# Patient Record
Sex: Female | Born: 1935 | Race: White | Hispanic: No | Smoking: Never smoker
Health system: Southern US, Community
[De-identification: ages and names within clinical notes are randomized; demographics above are authoritative.]

## PROBLEM LIST (undated history)

## (undated) DIAGNOSIS — I499 Cardiac arrhythmia, unspecified: Secondary | ICD-10-CM

## (undated) DIAGNOSIS — I10 Essential (primary) hypertension: Secondary | ICD-10-CM

## (undated) DIAGNOSIS — N3020 Other chronic cystitis without hematuria: Secondary | ICD-10-CM

## (undated) DIAGNOSIS — K219 Gastro-esophageal reflux disease without esophagitis: Secondary | ICD-10-CM

## (undated) DIAGNOSIS — D649 Anemia, unspecified: Secondary | ICD-10-CM

## (undated) DIAGNOSIS — N189 Chronic kidney disease, unspecified: Secondary | ICD-10-CM

## (undated) DIAGNOSIS — S81809A Unspecified open wound, unspecified lower leg, initial encounter: Secondary | ICD-10-CM

## (undated) DIAGNOSIS — M76899 Other specified enthesopathies of unspecified lower limb, excluding foot: Secondary | ICD-10-CM

## (undated) DIAGNOSIS — R269 Unspecified abnormalities of gait and mobility: Secondary | ICD-10-CM

## (undated) DIAGNOSIS — E11628 Type 2 diabetes mellitus with other skin complications: Secondary | ICD-10-CM

## (undated) DIAGNOSIS — K5900 Constipation, unspecified: Secondary | ICD-10-CM

## (undated) DIAGNOSIS — L270 Generalized skin eruption due to drugs and medicaments taken internally: Secondary | ICD-10-CM

## (undated) DIAGNOSIS — I96 Gangrene, not elsewhere classified: Secondary | ICD-10-CM

## (undated) DIAGNOSIS — I519 Heart disease, unspecified: Secondary | ICD-10-CM

## (undated) DIAGNOSIS — R270 Ataxia, unspecified: Secondary | ICD-10-CM

## (undated) DIAGNOSIS — L511 Stevens-Johnson syndrome: Secondary | ICD-10-CM

## (undated) DIAGNOSIS — IMO0002 Reserved for concepts with insufficient information to code with codable children: Secondary | ICD-10-CM

## (undated) DIAGNOSIS — S91009A Unspecified open wound, unspecified ankle, initial encounter: Secondary | ICD-10-CM

## (undated) DIAGNOSIS — R600 Localized edema: Secondary | ICD-10-CM

## (undated) DIAGNOSIS — E7800 Pure hypercholesterolemia, unspecified: Secondary | ICD-10-CM

## (undated) DIAGNOSIS — I509 Heart failure, unspecified: Secondary | ICD-10-CM

## (undated) DIAGNOSIS — L03119 Cellulitis of unspecified part of limb: Secondary | ICD-10-CM

## (undated) DIAGNOSIS — R310 Gross hematuria: Secondary | ICD-10-CM

## (undated) DIAGNOSIS — R339 Retention of urine, unspecified: Secondary | ICD-10-CM

## (undated) DIAGNOSIS — R32 Unspecified urinary incontinence: Secondary | ICD-10-CM

## (undated) DIAGNOSIS — I447 Left bundle-branch block, unspecified: Secondary | ICD-10-CM

## (undated) DIAGNOSIS — M7050 Other bursitis of knee, unspecified knee: Secondary | ICD-10-CM

## (undated) DIAGNOSIS — R350 Frequency of micturition: Secondary | ICD-10-CM

## (undated) DIAGNOSIS — S81009A Unspecified open wound, unspecified knee, initial encounter: Secondary | ICD-10-CM

## (undated) DIAGNOSIS — N179 Acute kidney failure, unspecified: Secondary | ICD-10-CM

## (undated) DIAGNOSIS — R300 Dysuria: Secondary | ICD-10-CM

## (undated) DIAGNOSIS — E8770 Fluid overload, unspecified: Secondary | ICD-10-CM

## (undated) DIAGNOSIS — E119 Type 2 diabetes mellitus without complications: Secondary | ICD-10-CM

## (undated) DIAGNOSIS — I4891 Unspecified atrial fibrillation: Secondary | ICD-10-CM

## (undated) DIAGNOSIS — F329 Major depressive disorder, single episode, unspecified: Secondary | ICD-10-CM

## (undated) HISTORY — DX: Type 2 diabetes mellitus with other skin complications: E11.628

## (undated) HISTORY — DX: Unspecified open wound, unspecified knee, initial encounter: S81.009A

## (undated) HISTORY — DX: Other bursitis of knee, unspecified knee: M70.50

## (undated) HISTORY — DX: Cellulitis of unspecified part of limb: L03.119

## (undated) HISTORY — DX: Gastro-esophageal reflux disease without esophagitis: K21.9

## (undated) HISTORY — DX: Chronic kidney disease, unspecified: N18.9

## (undated) HISTORY — DX: Anemia, unspecified: D64.9

## (undated) HISTORY — DX: Unspecified urinary incontinence: R32

## (undated) HISTORY — DX: Heart disease, unspecified: I51.9

## (undated) HISTORY — DX: Type 2 diabetes mellitus without complications: E11.9

## (undated) HISTORY — PX: BLADDER SURGERY: SHX569

## (undated) HISTORY — PX: TOE AMPUTATION: SHX809

## (undated) HISTORY — DX: Unspecified atrial fibrillation: I48.91

## (undated) HISTORY — DX: Unspecified abnormalities of gait and mobility: R26.9

## (undated) HISTORY — PX: TONSILLECTOMY: SUR1361

## (undated) HISTORY — DX: Pure hypercholesterolemia, unspecified: E78.00

## (undated) HISTORY — PX: NASAL SINUS SURGERY: SHX719

## (undated) HISTORY — PX: ENDOVENOUS ABLATION SAPHENOUS VEIN W/ LASER: SUR449

## (undated) HISTORY — DX: Other chronic cystitis without hematuria: N30.20

## (undated) HISTORY — DX: Gross hematuria: R31.0

## (undated) HISTORY — DX: Frequency of micturition: R35.0

## (undated) HISTORY — DX: Major depressive disorder, single episode, unspecified: F32.9

## (undated) HISTORY — DX: Localized edema: R60.0

## (undated) HISTORY — DX: Acute kidney failure, unspecified: N17.9

## (undated) HISTORY — DX: Constipation, unspecified: K59.00

## (undated) HISTORY — DX: Retention of urine, unspecified: R33.9

## (undated) HISTORY — DX: Ataxia, unspecified: R27.0

## (undated) HISTORY — DX: Other specified enthesopathies of unspecified lower limb, excluding foot: M76.899

## (undated) HISTORY — DX: Generalized skin eruption due to drugs and medicaments taken internally: L27.0

## (undated) HISTORY — DX: Unspecified open wound, unspecified lower leg, initial encounter: S81.809A

## (undated) HISTORY — DX: Essential (primary) hypertension: I10

## (undated) HISTORY — DX: Unspecified open wound, unspecified ankle, initial encounter: S91.009A

## (undated) HISTORY — PX: OTHER SURGICAL HISTORY: SHX169

## (undated) HISTORY — DX: Gangrene, not elsewhere classified: I96

## (undated) HISTORY — DX: Dysuria: R30.0

## (undated) HISTORY — DX: Stevens-Johnson syndrome: L51.1

## (undated) HISTORY — DX: Fluid overload, unspecified: E87.70

## (undated) HISTORY — DX: Reserved for concepts with insufficient information to code with codable children: IMO0002

## (undated) HISTORY — PX: ABDOMINAL HYSTERECTOMY: SHX81

## (undated) SURGERY — LEFT HEART CATH AND CORONARY ANGIOGRAPHY
Anesthesia: Moderate Sedation

---

## 1999-06-18 ENCOUNTER — Ambulatory Visit (HOSPITAL_COMMUNITY): Admission: RE | Admit: 1999-06-18 | Discharge: 1999-06-18 | Payer: Self-pay | Admitting: Gastroenterology

## 1999-06-25 ENCOUNTER — Encounter: Payer: Self-pay | Admitting: Gastroenterology

## 1999-06-25 ENCOUNTER — Ambulatory Visit (HOSPITAL_COMMUNITY): Admission: RE | Admit: 1999-06-25 | Discharge: 1999-06-25 | Payer: Self-pay | Admitting: Gastroenterology

## 2000-03-17 ENCOUNTER — Encounter: Payer: Self-pay | Admitting: Urology

## 2000-03-17 ENCOUNTER — Encounter: Admission: RE | Admit: 2000-03-17 | Discharge: 2000-03-17 | Payer: Self-pay | Admitting: Urology

## 2000-06-07 ENCOUNTER — Encounter: Payer: Self-pay | Admitting: Urology

## 2000-06-07 ENCOUNTER — Encounter: Admission: RE | Admit: 2000-06-07 | Discharge: 2000-06-07 | Payer: Self-pay | Admitting: Urology

## 2005-01-28 ENCOUNTER — Ambulatory Visit: Payer: Self-pay | Admitting: Unknown Physician Specialty

## 2005-04-15 ENCOUNTER — Ambulatory Visit: Payer: Self-pay | Admitting: Internal Medicine

## 2006-02-01 ENCOUNTER — Ambulatory Visit: Payer: Self-pay | Admitting: Unknown Physician Specialty

## 2007-02-03 ENCOUNTER — Ambulatory Visit: Payer: Self-pay | Admitting: Unknown Physician Specialty

## 2007-02-21 ENCOUNTER — Ambulatory Visit: Payer: Self-pay | Admitting: Unknown Physician Specialty

## 2007-05-15 ENCOUNTER — Inpatient Hospital Stay: Payer: Self-pay | Admitting: Internal Medicine

## 2007-05-15 ENCOUNTER — Other Ambulatory Visit: Payer: Self-pay

## 2007-07-03 ENCOUNTER — Inpatient Hospital Stay: Payer: Self-pay | Admitting: Internal Medicine

## 2007-07-03 ENCOUNTER — Other Ambulatory Visit: Payer: Self-pay

## 2007-07-27 ENCOUNTER — Ambulatory Visit: Payer: Self-pay | Admitting: Podiatry

## 2007-08-01 ENCOUNTER — Ambulatory Visit: Payer: Self-pay | Admitting: Internal Medicine

## 2007-08-03 ENCOUNTER — Observation Stay: Payer: Self-pay | Admitting: Internal Medicine

## 2007-08-19 ENCOUNTER — Ambulatory Visit: Payer: Self-pay | Admitting: Internal Medicine

## 2007-08-31 ENCOUNTER — Ambulatory Visit: Payer: Self-pay | Admitting: Internal Medicine

## 2007-10-01 ENCOUNTER — Ambulatory Visit: Payer: Self-pay | Admitting: Internal Medicine

## 2007-10-14 ENCOUNTER — Encounter: Payer: Self-pay | Admitting: Internal Medicine

## 2007-10-31 ENCOUNTER — Ambulatory Visit: Payer: Self-pay | Admitting: Internal Medicine

## 2007-10-31 ENCOUNTER — Encounter: Payer: Self-pay | Admitting: Internal Medicine

## 2007-11-09 ENCOUNTER — Ambulatory Visit: Payer: Self-pay | Admitting: Internal Medicine

## 2007-12-01 ENCOUNTER — Encounter: Payer: Self-pay | Admitting: Internal Medicine

## 2007-12-01 ENCOUNTER — Ambulatory Visit: Payer: Self-pay | Admitting: Internal Medicine

## 2008-01-01 ENCOUNTER — Encounter: Payer: Self-pay | Admitting: Internal Medicine

## 2008-01-29 ENCOUNTER — Encounter: Payer: Self-pay | Admitting: Internal Medicine

## 2008-01-31 ENCOUNTER — Ambulatory Visit: Payer: Self-pay | Admitting: Internal Medicine

## 2008-02-29 ENCOUNTER — Ambulatory Visit: Payer: Self-pay | Admitting: Internal Medicine

## 2008-04-26 ENCOUNTER — Ambulatory Visit: Payer: Self-pay | Admitting: Unknown Physician Specialty

## 2008-04-30 ENCOUNTER — Ambulatory Visit: Payer: Self-pay | Admitting: Internal Medicine

## 2008-05-01 ENCOUNTER — Ambulatory Visit: Payer: Self-pay | Admitting: Gastroenterology

## 2008-05-02 ENCOUNTER — Ambulatory Visit: Payer: Self-pay | Admitting: Internal Medicine

## 2008-05-30 ENCOUNTER — Ambulatory Visit: Payer: Self-pay | Admitting: Internal Medicine

## 2008-07-31 ENCOUNTER — Ambulatory Visit: Payer: Self-pay | Admitting: Internal Medicine

## 2008-08-30 ENCOUNTER — Ambulatory Visit: Payer: Self-pay | Admitting: Internal Medicine

## 2008-09-30 ENCOUNTER — Ambulatory Visit: Payer: Self-pay | Admitting: Internal Medicine

## 2008-10-09 ENCOUNTER — Ambulatory Visit: Payer: Self-pay | Admitting: Cardiology

## 2008-10-09 ENCOUNTER — Ambulatory Visit: Payer: Self-pay | Admitting: Ophthalmology

## 2008-10-22 ENCOUNTER — Ambulatory Visit: Payer: Self-pay | Admitting: Ophthalmology

## 2008-10-29 ENCOUNTER — Ambulatory Visit: Payer: Self-pay | Admitting: Internal Medicine

## 2008-10-30 ENCOUNTER — Ambulatory Visit: Payer: Self-pay | Admitting: Internal Medicine

## 2008-12-24 ENCOUNTER — Ambulatory Visit: Payer: Self-pay | Admitting: Ophthalmology

## 2009-05-08 ENCOUNTER — Ambulatory Visit: Payer: Self-pay | Admitting: Unknown Physician Specialty

## 2010-05-20 ENCOUNTER — Ambulatory Visit: Payer: Self-pay | Admitting: Unknown Physician Specialty

## 2010-10-31 ENCOUNTER — Ambulatory Visit: Payer: Self-pay | Admitting: Specialist

## 2010-11-20 ENCOUNTER — Ambulatory Visit: Payer: Self-pay | Admitting: Ophthalmology

## 2010-11-26 ENCOUNTER — Ambulatory Visit: Payer: Self-pay | Admitting: Ophthalmology

## 2011-02-12 ENCOUNTER — Ambulatory Visit: Payer: Self-pay | Admitting: Internal Medicine

## 2011-03-09 ENCOUNTER — Encounter: Payer: Self-pay | Admitting: Internal Medicine

## 2011-03-24 ENCOUNTER — Ambulatory Visit: Payer: Self-pay | Admitting: Internal Medicine

## 2011-03-31 ENCOUNTER — Encounter: Payer: Self-pay | Admitting: Internal Medicine

## 2011-03-31 ENCOUNTER — Ambulatory Visit: Payer: Self-pay | Admitting: Internal Medicine

## 2011-05-01 ENCOUNTER — Encounter: Payer: Self-pay | Admitting: Internal Medicine

## 2011-05-01 ENCOUNTER — Ambulatory Visit: Payer: Self-pay | Admitting: Internal Medicine

## 2011-06-11 ENCOUNTER — Ambulatory Visit: Payer: Self-pay | Admitting: Unknown Physician Specialty

## 2011-06-26 ENCOUNTER — Ambulatory Visit: Payer: Self-pay | Admitting: Urology

## 2011-07-02 ENCOUNTER — Ambulatory Visit: Payer: Self-pay | Admitting: Urology

## 2011-07-08 ENCOUNTER — Ambulatory Visit: Payer: Self-pay | Admitting: Urology

## 2012-02-08 DIAGNOSIS — K219 Gastro-esophageal reflux disease without esophagitis: Secondary | ICD-10-CM

## 2012-02-08 DIAGNOSIS — R269 Unspecified abnormalities of gait and mobility: Secondary | ICD-10-CM

## 2012-02-08 DIAGNOSIS — I10 Essential (primary) hypertension: Secondary | ICD-10-CM | POA: Insufficient documentation

## 2012-02-08 HISTORY — DX: Gastro-esophageal reflux disease without esophagitis: K21.9

## 2012-02-08 HISTORY — DX: Essential (primary) hypertension: I10

## 2012-02-08 HISTORY — DX: Unspecified abnormalities of gait and mobility: R26.9

## 2012-03-17 DIAGNOSIS — K5900 Constipation, unspecified: Secondary | ICD-10-CM

## 2012-03-17 HISTORY — DX: Constipation, unspecified: K59.00

## 2012-04-18 DIAGNOSIS — E119 Type 2 diabetes mellitus without complications: Secondary | ICD-10-CM | POA: Insufficient documentation

## 2012-04-18 DIAGNOSIS — IMO0002 Reserved for concepts with insufficient information to code with codable children: Secondary | ICD-10-CM

## 2012-04-18 HISTORY — DX: Reserved for concepts with insufficient information to code with codable children: IMO0002

## 2012-05-12 ENCOUNTER — Encounter (HOSPITAL_COMMUNITY): Payer: Self-pay | Admitting: *Deleted

## 2012-05-12 ENCOUNTER — Emergency Department (HOSPITAL_COMMUNITY): Payer: Medicare Other

## 2012-05-12 ENCOUNTER — Inpatient Hospital Stay (HOSPITAL_COMMUNITY)
Admission: EM | Admit: 2012-05-12 | Discharge: 2012-05-23 | DRG: 871 | Disposition: A | Discharge status: Home / Self Care | Payer: Medicare Other | Source: Ambulatory Visit | Attending: Internal Medicine | Admitting: Internal Medicine

## 2012-05-12 DIAGNOSIS — D649 Anemia, unspecified: Secondary | ICD-10-CM | POA: Diagnosis present

## 2012-05-12 DIAGNOSIS — T50995A Adverse effect of other drugs, medicaments and biological substances, initial encounter: Secondary | ICD-10-CM | POA: Diagnosis present

## 2012-05-12 DIAGNOSIS — T463X5A Adverse effect of coronary vasodilators, initial encounter: Secondary | ICD-10-CM | POA: Diagnosis present

## 2012-05-12 DIAGNOSIS — E876 Hypokalemia: Secondary | ICD-10-CM | POA: Diagnosis not present

## 2012-05-12 DIAGNOSIS — L298 Other pruritus: Secondary | ICD-10-CM | POA: Diagnosis present

## 2012-05-12 DIAGNOSIS — E8779 Other fluid overload: Secondary | ICD-10-CM | POA: Diagnosis present

## 2012-05-12 DIAGNOSIS — L02619 Cutaneous abscess of unspecified foot: Secondary | ICD-10-CM

## 2012-05-12 DIAGNOSIS — G9349 Other encephalopathy: Secondary | ICD-10-CM | POA: Diagnosis not present

## 2012-05-12 DIAGNOSIS — L97509 Non-pressure chronic ulcer of other part of unspecified foot with unspecified severity: Secondary | ICD-10-CM

## 2012-05-12 DIAGNOSIS — E1159 Type 2 diabetes mellitus with other circulatory complications: Secondary | ICD-10-CM | POA: Diagnosis present

## 2012-05-12 DIAGNOSIS — L270 Generalized skin eruption due to drugs and medicaments taken internally: Secondary | ICD-10-CM | POA: Diagnosis present

## 2012-05-12 DIAGNOSIS — Z882 Allergy status to sulfonamides status: Secondary | ICD-10-CM

## 2012-05-12 DIAGNOSIS — R6521 Severe sepsis with septic shock: Secondary | ICD-10-CM | POA: Diagnosis present

## 2012-05-12 DIAGNOSIS — R21 Rash and other nonspecific skin eruption: Secondary | ICD-10-CM | POA: Diagnosis not present

## 2012-05-12 DIAGNOSIS — L03039 Cellulitis of unspecified toe: Secondary | ICD-10-CM

## 2012-05-12 DIAGNOSIS — A419 Sepsis, unspecified organism: Principal | ICD-10-CM | POA: Diagnosis present

## 2012-05-12 DIAGNOSIS — L511 Stevens-Johnson syndrome: Secondary | ICD-10-CM | POA: Diagnosis present

## 2012-05-12 DIAGNOSIS — K3184 Gastroparesis: Secondary | ICD-10-CM | POA: Diagnosis present

## 2012-05-12 DIAGNOSIS — I10 Essential (primary) hypertension: Secondary | ICD-10-CM

## 2012-05-12 DIAGNOSIS — E119 Type 2 diabetes mellitus without complications: Secondary | ICD-10-CM

## 2012-05-12 DIAGNOSIS — E872 Acidosis, unspecified: Secondary | ICD-10-CM | POA: Diagnosis not present

## 2012-05-12 DIAGNOSIS — I447 Left bundle-branch block, unspecified: Secondary | ICD-10-CM | POA: Diagnosis not present

## 2012-05-12 DIAGNOSIS — I519 Heart disease, unspecified: Secondary | ICD-10-CM | POA: Diagnosis present

## 2012-05-12 DIAGNOSIS — S98119A Complete traumatic amputation of unspecified great toe, initial encounter: Secondary | ICD-10-CM

## 2012-05-12 DIAGNOSIS — E669 Obesity, unspecified: Secondary | ICD-10-CM | POA: Diagnosis present

## 2012-05-12 DIAGNOSIS — D509 Iron deficiency anemia, unspecified: Secondary | ICD-10-CM | POA: Diagnosis present

## 2012-05-12 DIAGNOSIS — E7800 Pure hypercholesterolemia, unspecified: Secondary | ICD-10-CM

## 2012-05-12 DIAGNOSIS — R6520 Severe sepsis without septic shock: Secondary | ICD-10-CM | POA: Diagnosis present

## 2012-05-12 DIAGNOSIS — L2989 Other pruritus: Secondary | ICD-10-CM | POA: Diagnosis present

## 2012-05-12 DIAGNOSIS — I4891 Unspecified atrial fibrillation: Secondary | ICD-10-CM

## 2012-05-12 DIAGNOSIS — Z79899 Other long term (current) drug therapy: Secondary | ICD-10-CM

## 2012-05-12 DIAGNOSIS — L0390 Cellulitis, unspecified: Secondary | ICD-10-CM

## 2012-05-12 DIAGNOSIS — E871 Hypo-osmolality and hyponatremia: Secondary | ICD-10-CM | POA: Diagnosis not present

## 2012-05-12 DIAGNOSIS — L03119 Cellulitis of unspecified part of limb: Secondary | ICD-10-CM | POA: Diagnosis present

## 2012-05-12 DIAGNOSIS — E11628 Type 2 diabetes mellitus with other skin complications: Secondary | ICD-10-CM | POA: Diagnosis present

## 2012-05-12 DIAGNOSIS — E8770 Fluid overload, unspecified: Secondary | ICD-10-CM

## 2012-05-12 DIAGNOSIS — N179 Acute kidney failure, unspecified: Secondary | ICD-10-CM | POA: Diagnosis not present

## 2012-05-12 DIAGNOSIS — E1165 Type 2 diabetes mellitus with hyperglycemia: Secondary | ICD-10-CM

## 2012-05-12 DIAGNOSIS — I480 Paroxysmal atrial fibrillation: Secondary | ICD-10-CM | POA: Diagnosis present

## 2012-05-12 DIAGNOSIS — E1149 Type 2 diabetes mellitus with other diabetic neurological complication: Secondary | ICD-10-CM | POA: Diagnosis present

## 2012-05-12 HISTORY — DX: Cellulitis of unspecified part of limb: L03.119

## 2012-05-12 HISTORY — DX: Type 2 diabetes mellitus without complications: E11.9

## 2012-05-12 HISTORY — DX: Cardiac arrhythmia, unspecified: I49.9

## 2012-05-12 HISTORY — DX: Essential (primary) hypertension: I10

## 2012-05-12 HISTORY — DX: Type 2 diabetes mellitus with other skin complications: E11.628

## 2012-05-12 HISTORY — DX: Pure hypercholesterolemia, unspecified: E78.00

## 2012-05-12 LAB — BASIC METABOLIC PANEL
BUN: 20 mg/dL (ref 6–23)
CO2: 24 mEq/L (ref 19–32)
Chloride: 102 mEq/L (ref 96–112)
Creatinine, Ser: 1.04 mg/dL (ref 0.50–1.10)
GFR calc Af Amer: 59 mL/min — ABNORMAL LOW (ref >90)
Glucose, Bld: 249 mg/dL — ABNORMAL HIGH (ref 70–99)
Potassium: 4 mEq/L (ref 3.5–5.1)

## 2012-05-12 LAB — DIFFERENTIAL
Basophils Relative: 0 % (ref 0–1)
Lymphocytes Relative: 35 % (ref 12–46)
Lymphs Abs: 1.9 10*3/uL (ref 0.7–4.0)
Monocytes Absolute: 0.6 10*3/uL (ref 0.1–1.0)
Monocytes Relative: 10 % (ref 3–12)
Neutro Abs: 2.8 10*3/uL (ref 1.7–7.7)
Neutrophils Relative %: 51 % (ref 43–77)

## 2012-05-12 LAB — CBC
HCT: 33.9 % — ABNORMAL LOW (ref 36.0–46.0)
Hemoglobin: 11 g/dL — ABNORMAL LOW (ref 12.0–15.0)
MCHC: 32.4 g/dL (ref 30.0–36.0)
RBC: 3.65 MIL/uL — ABNORMAL LOW (ref 3.87–5.11)

## 2012-05-12 LAB — SEDIMENTATION RATE: Sed Rate: 60 mm/hr — ABNORMAL HIGH (ref 0–22)

## 2012-05-12 MED ORDER — DILTIAZEM HCL 50 MG/10ML IV SOLN: 10.0000 mg | Freq: Once | INTRAVENOUS | Status: DC

## 2012-05-12 MED ORDER — DILTIAZEM HCL 100 MG IV SOLR: 5.0000 mg/h | INTRAVENOUS | Status: DC

## 2012-05-12 MED ORDER — DILTIAZEM HCL 25 MG/5ML IV SOLN
INTRAVENOUS | Status: AC
  Administered 2012-05-12: 10 mg
  Filled 2012-05-12: qty 5

## 2012-05-12 MED ORDER — ONDANSETRON HCL 4 MG PO TABS: 4.0000 mg | ORAL_TABLET | Freq: Four times a day (QID) | ORAL | Status: AC

## 2012-05-12 MED ORDER — SODIUM CHLORIDE 0.9 % IV BOLUS (SEPSIS)
1000.0000 mL | Freq: Once | INTRAVENOUS | Status: AC
  Administered 2012-05-13: 1000 mL via INTRAVENOUS

## 2012-05-12 MED ORDER — PIPERACILLIN-TAZOBACTAM 3.375 G IVPB
3.3750 g | Freq: Once | INTRAVENOUS | Status: DC
  Filled 2012-05-12: qty 50

## 2012-05-12 MED ORDER — PIPERACILLIN-TAZOBACTAM 3.375 G IVPB: 3.3750 g | Freq: Once | INTRAVENOUS | Status: DC

## 2012-05-12 MED ORDER — ONDANSETRON HCL 4 MG/2ML IJ SOLN: 4.0000 mg | Freq: Three times a day (TID) | INTRAMUSCULAR | Status: DC | PRN

## 2012-05-12 MED ORDER — VANCOMYCIN HCL IN DEXTROSE 1-5 GM/200ML-% IV SOLN
1000.0000 mg | Freq: Once | INTRAVENOUS | Status: AC
  Administered 2012-05-12: 1000 mg via INTRAVENOUS
  Filled 2012-05-12: qty 200

## 2012-05-12 MED ORDER — CIPROFLOXACIN HCL 500 MG PO TABS: 500.0000 mg | ORAL_TABLET | Freq: Two times a day (BID) | ORAL | Status: AC

## 2012-05-12 MED ORDER — DILTIAZEM HCL 100 MG IV SOLR
5.0000 mg/h | INTRAVENOUS | Status: DC
  Administered 2012-05-13: 10 mg/h via INTRAVENOUS
  Filled 2012-05-12: qty 100

## 2012-05-12 MED ORDER — PIPERACILLIN-TAZOBACTAM 3.375 G IVPB 30 MIN
3.3750 g | Freq: Three times a day (TID) | INTRAVENOUS | Status: DC
  Administered 2012-05-12: 3.375 g via INTRAVENOUS
  Filled 2012-05-12 (×2): qty 50

## 2012-05-12 MED ORDER — DILTIAZEM LOAD VIA INFUSION
10.0000 mg | Freq: Once | INTRAVENOUS | Status: AC
  Administered 2012-05-12: 10 mg via INTRAVENOUS
  Filled 2012-05-12: qty 10

## 2012-05-12 MED ORDER — SODIUM CHLORIDE 0.9 % IV SOLN
INTRAVENOUS | Status: AC
  Administered 2012-05-12: 23:00:00 via INTRAVENOUS

## 2012-05-12 NOTE — ED Notes (Signed)
MD notified of pt increased heart rate and rhythm. Pt on cardiac monitor. MD at bedside for eval.

## 2012-05-12 NOTE — ED Notes (Signed)
:  Pt. Dropped urine sample in bathroom and could not give another one. Pt. Changed into gown and given call light.

## 2012-05-12 NOTE — ED Notes (Signed)
Pt with right foot and leg swelling and cellulitis.  Foot is red and swollen, warm to touch.  Pt states she "stubbed" her toe a couple days ago.  3rd toe is swollen more than rest, great toe has been amputated.

## 2012-05-12 NOTE — ED Provider Notes (Signed)
History     CSN: [id number]  Arrival date & time 05/12/12  1602   First MD Initiated Contact with Patient 05/12/12 2121      Chief Complaint  Patient presents with  . Leg Injury  . Leg Swelling    (Consider location/radiation/quality/duration/timing/severity/associated sxs/prior treatment) HPI Comments: Patient presents with right foot and leg swelling and redness for the past one month. She hit her right third toe on a bed post several weeks ago and sustained an ulceration to the tip. She was placed on a course of doxycycline which she completed about one week ago. She's had increasing redness, swelling, pain in her right leg extending up towards the knee. She denies fevers at home. Her home health nurse sent her to the ER today because of increased redness and swelling. She has no pain in the bottom of her feet. 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  The history is provided by the patient and the spouse.    Past Medical History  Diagnosis Date  . Diabetes mellitus   . Hypertension     Past Surgical History  Procedure Date  . Toe amputation   . Tonsillectomy   . Abdominal hysterectomy     History reviewed. No pertinent family history.  History  Substance Use Topics  . Smoking status: Not on file  . Smokeless tobacco: Not on file  . Alcohol Use: No    OB History    Grav Para Term Preterm Abortions TAB SAB Ect Mult Living                  Review of Systems  Constitutional: Negative for fever, activity change and appetite change.  HENT: Negative for congestion and rhinorrhea.   Respiratory: Negative for cough, chest tightness and shortness of breath.   Cardiovascular: Negative for chest pain.  Gastrointestinal: Negative for nausea, vomiting and abdominal pain.  Genitourinary: Negative for dysuria.  Musculoskeletal: Positive for myalgias, arthralgias and gait problem. Negative for back pain.  Skin: Positive for rash and wound.    Neurological: Negative for dizziness, tremors and headaches.    Allergies  Sulfa antibiotics  Home Medications   Current Outpatient Rx  Name Route Sig Dispense Refill  . AMITRIPTYLINE HCL 75 MG PO TABS Oral Take 75 mg by mouth at bedtime.    . ATORVASTATIN CALCIUM 40 MG PO TABS Oral Take 40 mg by mouth daily.    Marland Kitchen ESOMEPRAZOLE MAGNESIUM 40 MG PO CPDR Oral Take 40 mg by mouth daily before breakfast.    . LISINOPRIL 20 MG PO TABS Oral Take 20 mg by mouth daily.    Marland Kitchen METFORMIN HCL 1000 MG PO TABS Oral Take 1,000 mg by mouth 2 (two) times daily.    Marland Kitchen METOPROLOL TARTRATE 50 MG PO TABS Oral Take 50 mg by mouth daily.    Marland Kitchen TAMSULOSIN HCL 0.4 MG PO CAPS Oral Take 0.4 mg by mouth daily.    Marland Kitchen CIPROFLOXACIN HCL 500 MG PO TABS Oral Take 1 tablet (500 mg total) by mouth 2 (two) times daily. 20 tablet 0  . ONDANSETRON HCL 4 MG PO TABS Oral Take 1 tablet (4 mg total) by mouth every 6 (six) hours. 12 tablet 0    BP 162/88  Pulse 140  Temp 97.8 F (36.6 C) (Oral)  Resp 30  SpO2 99%  Physical Exam  Constitutional: She is oriented to person, place, and time. She appears well-developed and well-nourished. No distress.  HENT:  Head: Normocephalic and atraumatic.  Mouth/Throat: Oropharynx is clear and moist. No oropharyngeal exudate.  Eyes: Conjunctivae are normal. Pupils are equal, round, and reactive to light.  Neck: Normal range of motion. Neck supple.  Cardiovascular: Normal rate, regular rhythm and normal heart sounds.   No murmur heard.      Tachycardic  Pulmonary/Chest: Effort normal and breath sounds normal. No respiratory distress.  Abdominal: Soft. There is no tenderness. There is no rebound and no guarding.  Musculoskeletal: Normal range of motion. She exhibits edema and tenderness.       Right great toe absent. Diffuse swelling and erythema of right third toe with ulceration that is clean-based on the plantar surface. There is erythema, edema, streaking redness of the dorsal foot and  shin. Significant pain and induration of the right calf and shin  Neurological: She is alert and oriented to person, place, and time. No cranial nerve deficit.  Skin: Skin is warm.    ED Course  Procedures (including critical care time)  Labs Reviewed  CBC - Abnormal; Notable for the following:    RBC 3.65 (*)     Hemoglobin 11.0 (*)     HCT 33.9 (*)     All other components within normal limits  BASIC METABOLIC PANEL - Abnormal; Notable for the following:    Glucose, Bld 249 (*)     GFR calc non Af Amer 51 (*)     GFR calc Af Amer 59 (*)     All other components within normal limits  SEDIMENTATION RATE - Abnormal; Notable for the following:    Sed Rate 60 (*)     All other components within normal limits  GLUCOSE, CAPILLARY - Abnormal; Notable for the following:    Glucose-Capillary 207 (*)     All other components within normal limits  DIFFERENTIAL  CULTURE, BLOOD (ROUTINE X 2)  CULTURE, BLOOD (ROUTINE X 2)  C-REACTIVE PROTEIN   Dg Tibia/fibula Right  05/12/2012  *RADIOLOGY REPORT*  Clinical Data: Right leg swelling  RIGHT TIBIA AND FIBULA - 2 VIEW  Comparison: None.  Findings: Diffuse soft tissue swelling.  Scattered vascular calcifications.  No acute or aggressive osseous abnormality identified.  IMPRESSION: Soft tissue swelling without underlying osseous abnormality identified.  Original Report Authenticated By: Waneta Martins, M.D.   Dg Foot Complete Right  05/12/2012  *RADIOLOGY REPORT*  Clinical Data: Leg swelling, right foot pain status post trauma.  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  The second through fifth digits are flexed at the PIP joints, limiting evaluation.  There is diffuse osteopenia. Diffuse soft tissue swelling. Metatarsal phalangeal degenerative changes.  Due to technique, the Lisfranc joint is poorly visualized on today's examination.  Plantar calcaneal  enthesopathic change. No acute fracture identified.  IMPRESSION: Significantly limited radiograph due to positioning and flexion of the PIP joints.  Diffuse osteopenia and soft tissue swelling.  No displaced fracture identified.  The Lisfranc joint is poorly visualized. If clinical concern for a fracture persists, recommend a repeat radiograph in 5-10 days to evaluate for interval change or callus formation.  Original Report Authenticated By: Waneta Martins, M.D.     1. Cellulitis   2. Diabetic foot ulcer   3. Atrial fibrillation with rapid ventricular response       MDM  Diabetic foot ulcer with cellulitis. No fever, chills, chest pain or shortness of breath. Mild tachycardia. Less  likely DVT. Antibiotics, x-rays, labs. Will need admission for IV antibiotics.   Plan admission for IVF backs given patient's failure of by mouth treatment. While waiting admission she developed atrial fibrillation and rapid ventricular response in the 150s. She reports no history of atrial fibrillation denies any chest pain or shortness of breath. She was given IV fluids, Cardizem and Dr. Conley Rolls was updated.   Date: 05/12/2012  Rate: 139  Rhythm: atrial fibrillation  QRS Axis: normal  Intervals: normal  ST/T Wave abnormalities: normal  Conduction Disutrbances:none  Narrative Interpretation:   Old EKG Reviewed: none available  CRITICAL CARE Performed by: Glynn Octave   Total critical care time: 30  Critical care time was exclusive of separately billable procedures and treating other patients.  Critical care was necessary to treat or prevent imminent or life-threatening deterioration.  Critical care was time spent personally by me on the following activities: development of treatment plan with patient and/or surrogate as well as nursing, discussions with consultants, evaluation of patient's response to treatment, examination of patient, obtaining history from patient or surrogate, ordering and  performing treatments and interventions, ordering and review of laboratory studies, ordering and review of radiographic studies, pulse oximetry and re-evaluation of patient's condition.     Glynn Octave, MD 05/12/12 2352

## 2012-05-12 NOTE — ED Notes (Signed)
To ED for eval of right leg pain, swelling, and redness since hitting toe a month ago on bed post. Under the care of podiatrist in [place]. HHN told pt she needs to come to ED due to increase redness and pain, unable to see dr until next week

## 2012-05-13 ENCOUNTER — Encounter (HOSPITAL_COMMUNITY): Payer: Self-pay

## 2012-05-13 DIAGNOSIS — R112 Nausea with vomiting, unspecified: Secondary | ICD-10-CM

## 2012-05-13 DIAGNOSIS — I4891 Unspecified atrial fibrillation: Secondary | ICD-10-CM

## 2012-05-13 DIAGNOSIS — L02619 Cutaneous abscess of unspecified foot: Secondary | ICD-10-CM

## 2012-05-13 DIAGNOSIS — L03039 Cellulitis of unspecified toe: Secondary | ICD-10-CM

## 2012-05-13 DIAGNOSIS — D649 Anemia, unspecified: Secondary | ICD-10-CM | POA: Diagnosis present

## 2012-05-13 DIAGNOSIS — M7989 Other specified soft tissue disorders: Secondary | ICD-10-CM

## 2012-05-13 DIAGNOSIS — E1165 Type 2 diabetes mellitus with hyperglycemia: Secondary | ICD-10-CM

## 2012-05-13 DIAGNOSIS — I480 Paroxysmal atrial fibrillation: Secondary | ICD-10-CM | POA: Diagnosis present

## 2012-05-13 DIAGNOSIS — I517 Cardiomegaly: Secondary | ICD-10-CM

## 2012-05-13 HISTORY — DX: Anemia, unspecified: D64.9

## 2012-05-13 HISTORY — DX: Unspecified atrial fibrillation: I48.91

## 2012-05-13 LAB — CBC
Hemoglobin: 10.2 g/dL — ABNORMAL LOW (ref 12.0–15.0)
MCH: 30 pg (ref 26.0–34.0)
MCHC: 32.5 g/dL (ref 30.0–36.0)
RDW: 14.4 % (ref 11.5–15.5)

## 2012-05-13 LAB — HEMOGLOBIN A1C
Hgb A1c MFr Bld: 7.6 % — ABNORMAL HIGH (ref <5.7)
Mean Plasma Glucose: 171 mg/dL — ABNORMAL HIGH (ref <117)

## 2012-05-13 LAB — RETICULOCYTES
RBC.: 3.95 MIL/uL (ref 3.87–5.11)
Retic Count, Absolute: 71.1 10*3/uL (ref 19.0–186.0)
Retic Ct Pct: 1.8 % (ref 0.4–3.1)

## 2012-05-13 LAB — BASIC METABOLIC PANEL
BUN: 14 mg/dL (ref 6–23)
Creatinine, Ser: 0.76 mg/dL (ref 0.50–1.10)
GFR calc non Af Amer: 80 mL/min — ABNORMAL LOW (ref >90)
Glucose, Bld: 223 mg/dL — ABNORMAL HIGH (ref 70–99)
Potassium: 3.2 mEq/L — ABNORMAL LOW (ref 3.5–5.1)

## 2012-05-13 LAB — GLUCOSE, CAPILLARY
Glucose-Capillary: 167 mg/dL — ABNORMAL HIGH (ref 70–99)
Glucose-Capillary: 283 mg/dL — ABNORMAL HIGH (ref 70–99)

## 2012-05-13 LAB — IRON AND TIBC
Saturation Ratios: 18 % — ABNORMAL LOW (ref 20–55)
UIBC: 260 ug/dL (ref 125–400)

## 2012-05-13 LAB — VITAMIN B12: Vitamin B-12: 1050 pg/mL — ABNORMAL HIGH (ref 211–911)

## 2012-05-13 MED ORDER — AMITRIPTYLINE HCL 75 MG PO TABS
75.0000 mg | ORAL_TABLET | Freq: Every day | ORAL | Status: DC
  Administered 2012-05-13 – 2012-05-14 (×2): 75 mg via ORAL
  Filled 2012-05-13 (×3): qty 1

## 2012-05-13 MED ORDER — METFORMIN HCL 500 MG PO TABS: 1000.0000 mg | ORAL_TABLET | Freq: Two times a day (BID) | ORAL | Status: DC

## 2012-05-13 MED ORDER — ASPIRIN EC 81 MG PO TBEC
81.0000 mg | DELAYED_RELEASE_TABLET | Freq: Every day | ORAL | Status: DC
  Administered 2012-05-13 – 2012-05-14 (×2): 81 mg via ORAL
  Filled 2012-05-13 (×3): qty 1

## 2012-05-13 MED ORDER — DILTIAZEM LOAD VIA INFUSION
10.0000 mg | Freq: Once | INTRAVENOUS | Status: AC
  Administered 2012-05-13: 10 mg via INTRAVENOUS

## 2012-05-13 MED ORDER — VANCOMYCIN HCL 1000 MG IV SOLR
1500.0000 mg | INTRAVENOUS | Status: DC
  Administered 2012-05-13 – 2012-05-14 (×2): 1500 mg via INTRAVENOUS
  Filled 2012-05-13 (×3): qty 1500

## 2012-05-13 MED ORDER — ONDANSETRON HCL 4 MG PO TABS: 4.0000 mg | ORAL_TABLET | Freq: Four times a day (QID) | ORAL | Status: DC | PRN

## 2012-05-13 MED ORDER — POTASSIUM CHLORIDE CRYS ER 20 MEQ PO TBCR
40.0000 meq | EXTENDED_RELEASE_TABLET | Freq: Once | ORAL | Status: AC
  Administered 2012-05-13: 40 meq via ORAL
  Filled 2012-05-13: qty 2

## 2012-05-13 MED ORDER — DILTIAZEM HCL 25 MG/5ML IV SOLN
INTRAVENOUS | Status: AC
  Administered 2012-05-13: 01:00:00
  Filled 2012-05-13: qty 5

## 2012-05-13 MED ORDER — DILTIAZEM HCL ER COATED BEADS 120 MG PO CP24
120.0000 mg | ORAL_CAPSULE | Freq: Every day | ORAL | Status: DC
  Administered 2012-05-13 – 2012-05-14 (×2): 120 mg via ORAL
  Filled 2012-05-13 (×3): qty 1

## 2012-05-13 MED ORDER — ATORVASTATIN CALCIUM 40 MG PO TABS
40.0000 mg | ORAL_TABLET | Freq: Every day | ORAL | Status: DC
  Administered 2012-05-13 – 2012-05-22 (×10): 40 mg via ORAL
  Filled 2012-05-13 (×12): qty 1

## 2012-05-13 MED ORDER — ENOXAPARIN SODIUM 40 MG/0.4ML ~~LOC~~ SOLN
40.0000 mg | Freq: Every day | SUBCUTANEOUS | Status: DC
  Administered 2012-05-13: 40 mg via SUBCUTANEOUS
  Filled 2012-05-13: qty 0.4

## 2012-05-13 MED ORDER — INSULIN ASPART 100 UNIT/ML ~~LOC~~ SOLN
4.0000 [IU] | Freq: Three times a day (TID) | SUBCUTANEOUS | Status: DC
  Administered 2012-05-13 – 2012-05-19 (×14): 4 [IU] via SUBCUTANEOUS
  Administered 2012-05-19 (×2): via SUBCUTANEOUS
  Administered 2012-05-20 – 2012-05-23 (×11): 4 [IU] via SUBCUTANEOUS

## 2012-05-13 MED ORDER — METOPROLOL TARTRATE 50 MG PO TABS
50.0000 mg | ORAL_TABLET | Freq: Every day | ORAL | Status: DC
  Administered 2012-05-13: 50 mg via ORAL
  Filled 2012-05-13 (×3): qty 1

## 2012-05-13 MED ORDER — WARFARIN VIDEO
1.0000 | Freq: Once | Status: DC
  Administered 2012-05-13: 1

## 2012-05-13 MED ORDER — WARFARIN - PHARMACIST DOSING INPATIENT: Freq: Every day | Status: DC

## 2012-05-13 MED ORDER — INSULIN ASPART 100 UNIT/ML ~~LOC~~ SOLN: 0.0000 [IU] | Freq: Every day | SUBCUTANEOUS | Status: DC

## 2012-05-13 MED ORDER — LISINOPRIL 20 MG PO TABS
20.0000 mg | ORAL_TABLET | Freq: Every day | ORAL | Status: DC
  Administered 2012-05-13 – 2012-05-14 (×2): 20 mg via ORAL
  Filled 2012-05-13 (×2): qty 1

## 2012-05-13 MED ORDER — DABIGATRAN ETEXILATE MESYLATE 150 MG PO CAPS
150.0000 mg | ORAL_CAPSULE | Freq: Two times a day (BID) | ORAL | Status: DC
  Administered 2012-05-13 – 2012-05-14 (×4): 150 mg via ORAL
  Filled 2012-05-13 (×6): qty 1

## 2012-05-13 MED ORDER — PANTOPRAZOLE SODIUM 40 MG PO TBEC
80.0000 mg | DELAYED_RELEASE_TABLET | Freq: Every day | ORAL | Status: DC
  Administered 2012-05-13 – 2012-05-14 (×2): 80 mg via ORAL
  Filled 2012-05-13: qty 1
  Filled 2012-05-13: qty 2
  Filled 2012-05-13: qty 1

## 2012-05-13 MED ORDER — ACETAMINOPHEN 325 MG PO TABS
650.0000 mg | ORAL_TABLET | Freq: Four times a day (QID) | ORAL | Status: DC | PRN
  Administered 2012-05-13 – 2012-05-22 (×8): 650 mg via ORAL
  Filled 2012-05-13 (×8): qty 2

## 2012-05-13 MED ORDER — METFORMIN HCL 500 MG PO TABS
1000.0000 mg | ORAL_TABLET | Freq: Two times a day (BID) | ORAL | Status: DC
  Administered 2012-05-13 – 2012-05-15 (×5): 1000 mg via ORAL
  Filled 2012-05-13 (×7): qty 2

## 2012-05-13 MED ORDER — COUMADIN BOOK
1.0000 | Freq: Once | Status: DC
  Filled 2012-05-13: qty 1

## 2012-05-13 MED ORDER — WARFARIN SODIUM 5 MG PO TABS
5.0000 mg | ORAL_TABLET | Freq: Once | ORAL | Status: DC
  Filled 2012-05-13: qty 1

## 2012-05-13 MED ORDER — SODIUM CHLORIDE 0.9 % IV SOLN: INTRAVENOUS | Status: DC

## 2012-05-13 MED ORDER — ONDANSETRON HCL 4 MG/2ML IJ SOLN
4.0000 mg | Freq: Four times a day (QID) | INTRAMUSCULAR | Status: DC | PRN
  Administered 2012-05-14 – 2012-05-21 (×5): 4 mg via INTRAVENOUS
  Filled 2012-05-13 (×6): qty 2

## 2012-05-13 MED ORDER — DILTIAZEM HCL 100 MG IV SOLR
5.0000 mg/h | INTRAVENOUS | Status: DC
  Administered 2012-05-13 (×2): 10 mg/h via INTRAVENOUS
  Administered 2012-05-13: 15 mg/h via INTRAVENOUS
  Filled 2012-05-13: qty 100

## 2012-05-13 MED ORDER — SODIUM CHLORIDE 0.9 % IV SOLN
INTRAVENOUS | Status: DC
  Administered 2012-05-13: 02:00:00 via INTRAVENOUS

## 2012-05-13 MED ORDER — ZOLPIDEM TARTRATE 5 MG PO TABS
5.0000 mg | ORAL_TABLET | Freq: Every evening | ORAL | Status: DC | PRN
  Administered 2012-05-14: 5 mg via ORAL
  Filled 2012-05-13: qty 1

## 2012-05-13 MED ORDER — PIPERACILLIN-TAZOBACTAM 3.375 G IVPB 30 MIN
3.3750 g | Freq: Three times a day (TID) | INTRAVENOUS | Status: DC
  Filled 2012-05-13 (×2): qty 50

## 2012-05-13 MED ORDER — INSULIN ASPART 100 UNIT/ML ~~LOC~~ SOLN
0.0000 [IU] | Freq: Three times a day (TID) | SUBCUTANEOUS | Status: DC
  Administered 2012-05-13: 11 [IU] via SUBCUTANEOUS
  Administered 2012-05-13: 4 [IU] via SUBCUTANEOUS
  Administered 2012-05-14 (×2): 7 [IU] via SUBCUTANEOUS
  Administered 2012-05-14: 3 [IU] via SUBCUTANEOUS
  Administered 2012-05-15: 7 [IU] via SUBCUTANEOUS

## 2012-05-13 MED ORDER — TAMSULOSIN HCL 0.4 MG PO CAPS
0.4000 mg | ORAL_CAPSULE | Freq: Every day | ORAL | Status: DC
  Administered 2012-05-13: 0.4 mg via ORAL
  Filled 2012-05-13: qty 1

## 2012-05-13 MED ORDER — PIPERACILLIN-TAZOBACTAM 3.375 G IVPB
3.3750 g | Freq: Three times a day (TID) | INTRAVENOUS | Status: DC
  Administered 2012-05-13 – 2012-05-15 (×7): 3.375 g via INTRAVENOUS
  Filled 2012-05-13 (×10): qty 50

## 2012-05-13 MED ORDER — MORPHINE SULFATE 2 MG/ML IJ SOLN
2.0000 mg | INTRAMUSCULAR | Status: DC | PRN
  Administered 2012-05-14 (×2): 2 mg via INTRAVENOUS
  Filled 2012-05-13 (×2): qty 1

## 2012-05-13 NOTE — ED Notes (Signed)
Attempted to call report. Nurse unavailable at this time.

## 2012-05-13 NOTE — Progress Notes (Signed)
VASCULAR LAB PRELIMINARY  PRELIMINARY  PRELIMINARY  PRELIMINARY  Right lower extremity venous duplex  completed.    Preliminary report:  Right:  No obvious evidence of DVT, superficial thrombosis, or Baker's cyst.  Limited by edema in the calf.  Terance Hart, RVT 05/13/2012, 4:40 PM

## 2012-05-13 NOTE — Care Management Note (Signed)
    Page 1 of 1   05/17/2012     8:53:09 AM   CARE MANAGEMENT NOTE 05/17/2012  Patient:  Pamela [name], Pamela [name]   Account Number:  [id number]  Date Initiated:  05/13/2012  Documentation initiated by:  SIMMONS,CRYSTAL  Subjective/Objective Assessment:   ADMITTED WITH CELLULITIS, NEW ONSET AFIB/ RVR;  LIVES AT HOME WITH HER HUSBAND- MACK; IPTA; USES [facility] [place]/ [place].     Action/Plan:   DISCHARGE PLANNING INITIATED.   Anticipated DC Date:  05/15/2012   Anticipated DC Plan:  HOME W HOME HEALTH SERVICES      DC Planning Services  CM consult      Choice offered to / List presented to:             Status of service:  In process, will continue to follow Medicare Important Message given?   (If response is "NO", the following Medicare IM given date fields will be blank) Date Medicare IM given:   Date Additional Medicare IM given:    Discharge Disposition:    Per UR Regulation:  Reviewed for med. necessity/level of care/duration of stay  If discussed at Long Length of Stay Meetings, dates discussed:    Comments:  05-17-12 8:50am Avie Arenas, RNBSN [phone] Became hypotensive - tx to ICU - on neo, cardizem for AFib which is now back in NSR, and IV heparin.  Renal failure which appears to have resolved and renal signed off on. Will continue to follow for dc needs.  05/13/12  0910  CRYSTAL SIMMONS RN, BSN [phone] NCM WILL FOLLOW.  CONTACTOBERIA [phone]

## 2012-05-13 NOTE — H&P (Signed)
PCP: Gentry Fitz   Chief Complaint: Increased redness and swelling of her right lower extremity   HPI: Pamela [name] is an 78 y.o. female with history of diabetes, hypertension, with right foot ulcer managed by the podiatrist, presents to the emergency room with increased redness and swelling. She has no fever or chills, headache, nausea, vomiting or any other symptomology. Evaluation in emergency room included a normal white count of 5.5 thousand, hemoglobin of 11 g per decaliter, blood sugar of 249 with normal renal function tests. Plain film of the distal tibia fibula revealed no osseous abnormality. In the emergency room, she developed atrial fibrillation with rapid ventricular rate of 150. She stated she has intermittently felt this way before. She never had any stroke or TIA symptomology. No history of congestive heart failure. She has no history of bleeding ulcer, easy bruisability, or frequent falls.  Review of Systems:  The patient denies anorexia, fever, weight loss,, vision loss, decreased hearing, hoarseness, chest pain, syncope, dyspnea on exertion, peripheral edema, balance deficits, hemoptysis, abdominal pain, melena, hematochezia, severe indigestion/heartburn, hematuria, incontinence, genital sores, muscle weakness, suspicious skin lesions, transient blindness, difficulty walking, depression, unusual weight change, abnormal bleeding, enlarged lymph nodes, angioedema, and breast masses.    Past Medical History  Diagnosis Date  . Diabetes mellitus   . Hypertension     Past Surgical History  Procedure Date  . Toe amputation   . Tonsillectomy   . Abdominal hysterectomy     Medications:  HOME MEDS: Prior to Admission medications   Medication Sig Start Date End Date Taking? Authorizing Provider  amitriptyline (ELAVIL) 75 MG tablet Take 75 mg by mouth at bedtime.   Yes Historical Provider, MD  atorvastatin (LIPITOR) 40 MG tablet Take 40 mg by mouth daily.   Yes Historical  Provider, MD  esomeprazole (NEXIUM) 40 MG capsule Take 40 mg by mouth daily before breakfast.   Yes Historical Provider, MD  lisinopril (PRINIVIL,ZESTRIL) 20 MG tablet Take 20 mg by mouth daily.   Yes Historical Provider, MD  metFORMIN (GLUCOPHAGE) 1000 MG tablet Take 1,000 mg by mouth 2 (two) times daily.   Yes Historical Provider, MD  metoprolol (LOPRESSOR) 50 MG tablet Take 50 mg by mouth daily.   Yes Historical Provider, MD  Tamsulosin HCl (FLOMAX) 0.4 MG CAPS Take 0.4 mg by mouth daily.   Yes Historical Provider, MD  ciprofloxacin (CIPRO) 500 MG tablet Take 1 tablet (500 mg total) by mouth 2 (two) times daily. 05/12/12 05/22/12  Glynn Octave, MD  ondansetron (ZOFRAN) 4 MG tablet Take 1 tablet (4 mg total) by mouth every 6 (six) hours. 05/12/12 05/19/12  Glynn Octave, MD     Allergies:  Allergies  Allergen Reactions  . Sulfa Antibiotics     unknown    Social History:   does not have a smoking history on file. She does not have any smokeless tobacco history on file. She reports that she does not drink alcohol. Her drug history not on file.  Family History: History reviewed. No pertinent family history.   Physical Exam: Filed Vitals:   05/12/12 2244 05/12/12 2326 05/12/12 2336 05/13/12 0006  BP: 153/69 162/88  157/96  Pulse: 86 68 140   Temp:  97.8 F (36.6 C)    TempSrc:  Oral    Resp: 16 16 30    SpO2: 100% 99% 99%    Blood pressure 157/96, pulse 140, temperature 97.8 F (36.6 C), temperature source Oral, resp. rate 30, SpO2 99.00%.  GEN:  Pleasant  person lying in the stretcher in no acute distress; cooperative with exam PSYCH:  alert and oriented x4; does not appear anxious or depressed; affect is appropriate. HEENT: Mucous membranes pink and anicteric; PERRLA; EOM intact; no cervical lymphadenopathy nor thyromegaly or carotid bruit; no JVD; Breasts:: Not examined CHEST WALL: No tenderness CHEST: Normal respiration, clear to auscultation bilaterally HEART: Regular  rate and rhythm; no murmurs rubs or gallops BACK: No kyphosis or scoliosis; no CVA tenderness ABDOMEN: Obese, soft non-tender; no masses, no organomegaly, normal abdominal bowel sounds; no pannus; no intertriginous candida. Rectal Exam: Not done EXTREMITIES: No bone or joint deformity; age-appropriate arthropathy of the hands and knees; there is erythema and swelling of her right lower extremity  Genitalia: not examined PULSES: 2+ and symmetric SKIN: Normal hydration no rash or ulceration CNS: Cranial nerves 2-12 grossly intact no focal lateralizing neurologic deficit   Labs & Imaging Results for orders placed during the hospital encounter of 05/12/12 (from the past 48 hour(s))  CBC     Status: Abnormal   Collection Time   05/12/12  9:22 PM      Component Value Range Comment   WBC 5.5  4.0 - 10.5 K/uL    RBC 3.65 (*) 3.87 - 5.11 MIL/uL    Hemoglobin 11.0 (*) 12.0 - 15.0 g/dL    HCT 03.4 (*) 74.2 - 46.0 %    MCV 92.9  78.0 - 100.0 fL    MCH 30.1  26.0 - 34.0 pg    MCHC 32.4  30.0 - 36.0 g/dL    RDW 59.5  63.8 - 75.6 %    Platelets 197  150 - 400 K/uL   DIFFERENTIAL     Status: Normal   Collection Time   05/12/12  9:22 PM      Component Value Range Comment   Neutrophils Relative 51  43 - 77 %    Neutro Abs 2.8  1.7 - 7.7 K/uL    Lymphocytes Relative 35  12 - 46 %    Lymphs Abs 1.9  0.7 - 4.0 K/uL    Monocytes Relative 10  3 - 12 %    Monocytes Absolute 0.6  0.1 - 1.0 K/uL    Eosinophils Relative 3  0 - 5 %    Eosinophils Absolute 0.2  0.0 - 0.7 K/uL    Basophils Relative 0  0 - 1 %    Basophils Absolute 0.0  0.0 - 0.1 K/uL   BASIC METABOLIC PANEL     Status: Abnormal   Collection Time   05/12/12  9:22 PM      Component Value Range Comment   Sodium 138  135 - 145 mEq/L    Potassium 4.0  3.5 - 5.1 mEq/L    Chloride 102  96 - 112 mEq/L    CO2 24  19 - 32 mEq/L    Glucose, Bld 249 (*) 70 - 99 mg/dL    BUN 20  6 - 23 mg/dL    Creatinine, Ser 4.33  0.50 - 1.10 mg/dL    Calcium  9.1  8.4 - 10.5 mg/dL    GFR calc non Af Amer 51 (*) >90 mL/min    GFR calc Af Amer 59 (*) >90 mL/min   SEDIMENTATION RATE     Status: Abnormal   Collection Time   05/12/12  9:22 PM      Component Value Range Comment   Sed Rate 60 (*) 0 - 22 mm/hr   GLUCOSE, CAPILLARY  Status: Abnormal   Collection Time   05/12/12  9:53 PM      Component Value Range Comment   Glucose-Capillary 207 (*) 70 - 99 mg/dL    Dg Tibia/fibula Right  05/12/2012  *RADIOLOGY REPORT*  Clinical Data: Right leg swelling  RIGHT TIBIA AND FIBULA - 2 VIEW  Comparison: None.  Findings: Diffuse soft tissue swelling.  Scattered vascular calcifications.  No acute or aggressive osseous abnormality identified.  IMPRESSION: Soft tissue swelling without underlying osseous abnormality identified.  Original Report Authenticated By: Waneta Martins, M.D.   Dg Foot Complete Right  05/12/2012  *RADIOLOGY REPORT*  Clinical Data: Leg swelling, right foot pain status post trauma.  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  The second through fifth digits are flexed at the PIP joints, limiting evaluation.  There is diffuse osteopenia. Diffuse soft tissue swelling. Metatarsal phalangeal degenerative changes.  Due to technique, the Lisfranc joint is poorly visualized on today's examination.  Plantar calcaneal enthesopathic change. No acute fracture identified.  IMPRESSION: Significantly limited radiograph due to positioning and flexion of the PIP joints.  Diffuse osteopenia and soft tissue swelling.  No displaced fracture identified.  The Lisfranc joint is poorly visualized. If clinical concern for a fracture persists, recommend a repeat radiograph in 5-10 days to evaluate for interval change or callus formation.  Original Report Authenticated By: Waneta Martins, M.D.      Assessment Present on Admission:  .Cellulitis in diabetic foot .DM  (diabetes mellitus) .Hypercholesterolemia A. fib with rapid ventricular rate, new onset Hypertension   PLAN: For her cellulitis, will start vancomycin and Zosyn. I have marked her cellulitis with date and time. Because of the swelling of her lower extremities will also get a Doppler to rule out DVT. For her diabetes, will give her insulin sliding scales. For her hypercholesterolemia we'll continue her medication. She developed fibrillation with rapid ventricular rate and was placed on Cardizem drip. Will transition to by mouth medication tomorrow. Will obtain an echo. Her CHADS Score is 3, and thus I have started her on Coumadin. Risks and benefits explained and she has no contraindication. We'll admit her to telemetry. She is full code, stable, and will be admitted to triad hospitalist service.   Other plans as per orders.    Pamela [name] 05/13/2012, 12:47 AM

## 2012-05-13 NOTE — Progress Notes (Signed)
ANTICOAGULATION CONSULT NOTE - Follow Up Consult  Pharmacy Consult for Warfarin, Vancomycin Indication: atrial fibrillation  Allergies  Allergen Reactions  . Sulfa Antibiotics     unknown    Patient Measurements: Height: 5\' 5"  (165.1 cm) Weight: 186 lb 4.6 oz (84.5 kg) IBW/kg (Calculated) : 57   Vital Signs: Temp: 98.7 F (37.1 C) (06/14 0441) Temp src: Oral (06/14 0441) BP: 118/60 mmHg (06/14 0441) Pulse Rate: 81  (06/14 0441)  Labs:  Basename 05/13/12 0555 05/12/12 2122  HGB 10.2* 11.0*  HCT 31.4* 33.9*  PLT 167 197  APTT -- --  LABPROT 15.3* --  INR 1.18 --  HEPARINUNFRC -- --  CREATININE 0.76 1.04  CKTOTAL -- --  CKMB -- --  TROPONINI -- --    Estimated Creatinine Clearance: 64.2 ml/min (by C-G formula based on Cr of 0.76).   Medications:  Prescriptions prior to admission  Medication Sig Dispense Refill  . amitriptyline (ELAVIL) 75 MG tablet Take 75 mg by mouth at bedtime.      Marland Kitchen atorvastatin (LIPITOR) 40 MG tablet Take 40 mg by mouth daily.      Marland Kitchen esomeprazole (NEXIUM) 40 MG capsule Take 40 mg by mouth daily before breakfast.      . lisinopril (PRINIVIL,ZESTRIL) 20 MG tablet Take 20 mg by mouth daily.      . metFORMIN (GLUCOPHAGE) 1000 MG tablet Take 1,000 mg by mouth 2 (two) times daily.      . metoprolol (LOPRESSOR) 50 MG tablet Take 50 mg by mouth daily.      . Tamsulosin HCl (FLOMAX) 0.4 MG CAPS Take 0.4 mg by mouth daily.        Admit Complaint: 78 y.o.  female  admitted 05/12/2012 with  cellulitis of right foot. In ED, patient developed atrial fibrillation with rapid ventricular rate of 150. Pharmacy consulted to manage Coumadin. Confirmed with admitting physician that he only wanted Lovenox dosing for VTE prophylaxis (40mg  SQ daily). Pharmacy to manage vancomycin for cellulitis. Patient is also to receive Zosyn.   Assessment: Anticoagulation: new Afib, CHADS2=3 (CHADS-VASc 5), warfarin to start tonight baseline INR 1.18, DVT Px, enox 40 daily, H/H  down slightly platelets stable, no bleeding noted  Infectious Disease: Right foot ulcer managed by podiatrist PTA Antibiotics: 6/13 Zosyn 6/13 Vancomycin (on Cipro PTA)  Cardiovascular: HTN, Afib: ASA, atorvastatin, lisinopril, metoprolol, IV dilt @15 : HR 68-140, SBP stable  Endocrinology: DM: SSI, metformin: glucose >200,   Gastrointestinal / Nutrition: Protonix (on nexium PTA)  PTA Medication Issues: All Home Meds  Ordered:   Best Practices: DVT Prophylaxis:  Lovenox, warfarin  Goal of Therapy:  INR 2-3 Monitor platelets by anticoagulation protocol: Yes  Plan:  1. Warfarin 5 mg PO x1 today, ordered last PM 2. Daily INR 3. Continue vancomycin at current dose, follow up SCr, UOP, cultures, clinical course and adjust as clinically indicated.   Thank you for allowing pharmacy to be a part of this patients care team.  Lovenia Kim Pharm.D., BCPS Clinical Pharmacist 05/13/2012 9:28 AM Pager: [phone] Phone: [phone]

## 2012-05-13 NOTE — Progress Notes (Addendum)
ANTICOAGULATION CONSULT NOTE - Initial Consult  Pharmacy Consult for Coumadin and vancomycin  Indication: atrial fibrillation, cellulitis  Allergies  Allergen Reactions  . Sulfa Antibiotics     unknown    Patient Measurements: Height: 5\' 5"  (165.1 cm) Weight: 186 lb 4.6 oz (84.5 kg) IBW/kg (Calculated) : 57   Vital Signs: Temp: 97.8 F (36.6 C) (06/14 0224) Temp src: Oral (06/14 0224) BP: 134/74 mmHg (06/14 0224) Pulse Rate: 91  (06/14 0224)  Labs:  Alvira Philips 05/12/12 2122  HGB 11.0*  HCT 33.9*  PLT 197  APTT --  LABPROT --  INR --  HEPARINUNFRC --  CREATININE 1.04  CKTOTAL --  CKMB --  TROPONINI --    Estimated Creatinine Clearance: 49.4 ml/min (by C-G formula based on Cr of 1.04).   Medical History: Past Medical History  Diagnosis Date  . Diabetes mellitus   . Hypertension     Medications:  Scheduled:    . sodium chloride   Intravenous STAT  . amitriptyline  75 mg Oral QHS  . aspirin EC  81 mg Oral Daily  . atorvastatin  40 mg Oral q1800  . diltiazem  10 mg Intravenous Once  . diltiazem  10 mg Intravenous Once  . diltiazem      . diltiazem      . enoxaparin  40 mg Subcutaneous Q24H  . lisinopril  20 mg Oral Daily  . metFORMIN  1,000 mg Oral BID WC  . metoprolol  50 mg Oral Daily  . pantoprazole  80 mg Oral Q1200  . piperacillin-tazobactam (ZOSYN)  IV  3.375 g Intravenous Q8H  . sodium chloride  1,000 mL Intravenous Once  . Tamsulosin HCl  0.4 mg Oral Daily  . vancomycin  1,000 mg Intravenous Once  . DISCONTD: diltiazem  10 mg Intravenous Once  . DISCONTD: metFORMIN  1,000 mg Oral BID  . DISCONTD: piperacillin-tazobactam (ZOSYN)  IV  3.375 g Intravenous Once  . DISCONTD: piperacillin-tazobactam (ZOSYN)  IV  3.375 g Intravenous Once  . DISCONTD: piperacillin-tazobactam  3.375 g Intravenous Q8H  . DISCONTD: piperacillin-tazobactam  3.375 g Intravenous Q8H    Assessment: 78 yo female admitted with cellulitis of right foot. In ED, patient  developed atrial fibrillation with rapid ventricular rate of 150. Pharmacy consulted to manage Coumadin. Confirmed with admitting physician that he only wanted Lovenox dosing for VTE prophylaxis (40mg  SQ daily). Pharmacy to manage vancomycin for cellulitis. Patient is also to receive Zosyn.   Goal of Therapy:  INR 2-3 Monitor platelets by anticoagulation protocol: Yes   Plan:  1. Baseline INR with AM labs.   2. Vancomycin 1500 mg IV Q24H.  3. Daily PT / INR 4. Coumadin book / video  Emeline Gins 05/13/2012,2:52 AM  Addendum: Baseline INR 1.18. Coumadin 5mg  po today.   Lorre Munroe 05/13/12, 07:25 AM

## 2012-05-13 NOTE — Progress Notes (Signed)
[facility] PROGRESS NOTE  JELISHA WEED QIO:[id number] DOB: [birth year] DOA: 05/12/2012 PCP: No primary provider on file. PCP: Veneda Melter in [place], [place] Assessment/Plan: 1. Severe Cellulitis of the RLE on a background of chronic venous insufficiency and prior RLE injury - improving - for now will continue current abx and monitor progression.  2. Afib with RVR - new diagnosis - will check echocardiogram to diagnose underlying cardiomyopathy - suspect hypertensive heart ds as the etiology. C/w BB. Add CCB. Converted back to NSR after an overnight cardizem drip. Start full anticoagulation with pradaxa. She will f/u with her primary cardiologist in [place], [place] - Dr.Fath  3. Anemia - will w/u for etiology given the fact that we are starting anticoagulation  4. DM 2 - c/w home meds and added SSI  Principal Problem:  *Cellulitis in diabetic foot Active Problems:  DM (diabetes mellitus)  HTN (hypertension)  Hypercholesterolemia  Afib  Anemia  Code Status: full  Kamea Dacosta, MD  [facility] Pager [phone]  If 7PM-7AM, please contact night-coverage [email or web address] Password [email or web address] 05/13/2012, 1:06 PM   LOS: 1 day   Antibiotics:  Vanc 05/12/12  Zosyn 05/12/12  HPI/Subjective: Feels better - no further leg pain, no further palpitations  Objective: Filed Vitals:   05/13/12 0130 05/13/12 0224 05/13/12 0229 05/13/12 0441  BP: 147/69 134/74  118/60  Pulse: 88 91  81  Temp:  97.8 F (36.6 C)  98.7 F (37.1 C)  TempSrc:  Oral  Oral  Resp: 19 20  20   Height:   5\' 5"  (1.651 m)   Weight:   84.5 kg (186 lb 4.6 oz)   SpO2: 96% 97%  94%    Intake/Output Summary (Last 24 hours) at 05/13/12 1306 Last data filed at 05/13/12 0800  Gross per 24 hour  Intake    360 ml  Output    400 ml  Net    -40 ml    Exam:   General:  axox3  Cardiovascular: RRR, no M, R,G  Respiratory: CTAB  Abdomen: soft, NT  RLE with erythema, mild edema, severe deformity  of toes on the right foot  Data Reviewed: Basic Metabolic Panel:  Lab 05/13/12 2725 05/12/12 2122  NA 140 138  K 3.2* 4.0  CL 110 102  CO2 20 24  GLUCOSE 223* 249*  BUN 14 20  CREATININE 0.76 1.04  CALCIUM 7.3* 9.1  MG -- --  PHOS -- --   Liver Function Tests: No results found for this basename: AST:5,ALT:5,ALKPHOS:5,BILITOT:5,PROT:5,ALBUMIN:5 in the last 168 hours No results found for this basename: LIPASE:5,AMYLASE:5 in the last 168 hours No results found for this basename: AMMONIA:5 in the last 168 hours CBC:  Lab 05/13/12 0555 05/12/12 2122  WBC 5.7 5.5  NEUTROABS -- 2.8  HGB 10.2* 11.0*  HCT 31.4* 33.9*  MCV 92.4 92.9  PLT 167 197   Cardiac Enzymes: No results found for this basename: CKTOTAL:5,CKMB:5,CKMBINDEX:5,TROPONINI:5 in the last 168 hours BNP (last 3 results) No results found for this basename: PROBNP:3 in the last 8760 hours CBG:  Lab 05/13/12 1133 05/13/12 0611 05/13/12 0230 05/12/12 2153  GLUCAP 283* 173* 167* 207*    No results found for this or any previous visit (from the past 240 hour(s)).   Studies: Dg Tibia/fibula Right  05/12/2012  *RADIOLOGY REPORT*  Clinical Data: Right leg swelling  RIGHT TIBIA AND FIBULA - 2 VIEW  Comparison: None.  Findings: Diffuse soft tissue swelling.  Scattered vascular calcifications.  No acute or aggressive osseous  abnormality identified.  IMPRESSION: Soft tissue swelling without underlying osseous abnormality identified.  Original Report Authenticated By: Waneta Martins, M.D.   Dg Foot Complete Right  05/12/2012  *RADIOLOGY REPORT*  Clinical Data: Leg swelling, right foot pain status post trauma.  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  The second through fifth digits are flexed at the PIP joints, limiting evaluation.  There is diffuse osteopenia. Diffuse soft tissue swelling. Metatarsal phalangeal degenerative  changes.  Due to technique, the Lisfranc joint is poorly visualized on today's examination.  Plantar calcaneal enthesopathic change. No acute fracture identified.  IMPRESSION: Significantly limited radiograph due to positioning and flexion of the PIP joints.  Diffuse osteopenia and soft tissue swelling.  No displaced fracture identified.  The Lisfranc joint is poorly visualized. If clinical concern for a fracture persists, recommend a repeat radiograph in 5-10 days to evaluate for interval change or callus formation.  Original Report Authenticated By: Waneta Martins, M.D.    Scheduled Meds:    . sodium chloride   Intravenous STAT  . amitriptyline  75 mg Oral QHS  . aspirin EC  81 mg Oral Daily  . atorvastatin  40 mg Oral q1800  . dabigatran  150 mg Oral Q12H  . diltiazem  120 mg Oral Daily  . diltiazem  10 mg Intravenous Once  . diltiazem  10 mg Intravenous Once  . diltiazem      . diltiazem      . insulin aspart  0-20 Units Subcutaneous TID WC  . insulin aspart  0-5 Units Subcutaneous QHS  . insulin aspart  4 Units Subcutaneous TID WC  . lisinopril  20 mg Oral Daily  . metFORMIN  1,000 mg Oral BID WC  . metoprolol  50 mg Oral Daily  . pantoprazole  80 mg Oral Q1200  . piperacillin-tazobactam (ZOSYN)  IV  3.375 g Intravenous Q8H  . potassium chloride  40 mEq Oral Once  . potassium chloride  40 mEq Oral Once  . sodium chloride  1,000 mL Intravenous Once  . vancomycin  1,500 mg Intravenous Q24H  . vancomycin  1,000 mg Intravenous Once  . DISCONTD: coumadin book  1 each Does not apply Once  . DISCONTD: diltiazem  10 mg Intravenous Once  . DISCONTD: enoxaparin  40 mg Subcutaneous QHS  . DISCONTD: metFORMIN  1,000 mg Oral BID  . DISCONTD: piperacillin-tazobactam (ZOSYN)  IV  3.375 g Intravenous Once  . DISCONTD: piperacillin-tazobactam (ZOSYN)  IV  3.375 g Intravenous Once  . DISCONTD: piperacillin-tazobactam  3.375 g Intravenous Q8H  . DISCONTD: piperacillin-tazobactam  3.375 g  Intravenous Q8H  . DISCONTD: Tamsulosin HCl  0.4 mg Oral Daily  . DISCONTD: warfarin  5 mg Oral ONCE-1800  . DISCONTD: warfarin  1 each Does not apply Once  . DISCONTD: Warfarin - Pharmacist Dosing Inpatient   Does not apply q1800   Continuous Infusions:    . sodium chloride    . DISCONTD: sodium chloride 100 mL/hr at 05/13/12 0227  . DISCONTD: diltiazem (CARDIZEM) infusion    . DISCONTD: diltiazem (CARDIZEM) infusion 10 mg/hr (05/13/12 0004)  . DISCONTD: diltiazem (CARDIZEM) infusion 15 mg/hr (05/13/12 0920)

## 2012-05-13 NOTE — Progress Notes (Signed)
  Echocardiogram 2D Echocardiogram has been performed.  Pamela [name] [facility] 05/13/2012, 10:22 AM

## 2012-05-14 DIAGNOSIS — I4891 Unspecified atrial fibrillation: Secondary | ICD-10-CM

## 2012-05-14 DIAGNOSIS — E1165 Type 2 diabetes mellitus with hyperglycemia: Secondary | ICD-10-CM

## 2012-05-14 DIAGNOSIS — R112 Nausea with vomiting, unspecified: Secondary | ICD-10-CM

## 2012-05-14 DIAGNOSIS — I519 Heart disease, unspecified: Secondary | ICD-10-CM

## 2012-05-14 DIAGNOSIS — L03039 Cellulitis of unspecified toe: Secondary | ICD-10-CM

## 2012-05-14 DIAGNOSIS — L02619 Cutaneous abscess of unspecified foot: Secondary | ICD-10-CM

## 2012-05-14 HISTORY — DX: Heart disease, unspecified: I51.9

## 2012-05-14 LAB — CBC
MCH: 29.7 pg (ref 26.0–34.0)
MCHC: 32.2 g/dL (ref 30.0–36.0)
MCV: 92.3 fL (ref 78.0–100.0)
Platelets: 181 10*3/uL (ref 150–400)
RBC: 4.01 MIL/uL (ref 3.87–5.11)

## 2012-05-14 LAB — BASIC METABOLIC PANEL
BUN: 22 mg/dL (ref 6–23)
CO2: 19 mEq/L (ref 19–32)
Calcium: 8.1 mg/dL — ABNORMAL LOW (ref 8.4–10.5)
Glucose, Bld: 256 mg/dL — ABNORMAL HIGH (ref 70–99)
Sodium: 134 mEq/L — ABNORMAL LOW (ref 135–145)

## 2012-05-14 LAB — GLUCOSE, CAPILLARY
Glucose-Capillary: 236 mg/dL — ABNORMAL HIGH (ref 70–99)
Glucose-Capillary: 218 mg/dL — ABNORMAL HIGH (ref 70–99)
Glucose-Capillary: 173 mg/dL — ABNORMAL HIGH (ref 70–99)

## 2012-05-14 MED ORDER — INSULIN GLARGINE 100 UNIT/ML ~~LOC~~ SOLN
5.0000 [IU] | Freq: Every day | SUBCUTANEOUS | Status: DC
  Administered 2012-05-14: 5 [IU] via SUBCUTANEOUS

## 2012-05-14 MED ORDER — FERROUS FUMARATE 325 (106 FE) MG PO TABS
1.0000 | ORAL_TABLET | Freq: Two times a day (BID) | ORAL | Status: DC
  Administered 2012-05-14 (×2): 106 mg via ORAL
  Filled 2012-05-14 (×4): qty 1

## 2012-05-14 NOTE — Progress Notes (Signed)
[facility] PROGRESS NOTE  Pamela [name] WUJ:[id number] DOB: 04-26-1934 DOA: 05/12/2012 PCP: No primary provider on file. PCP: Veneda Melter in [place], [place] Assessment/Plan: 1. Severe Cellulitis of the RLE on a background of chronic venous insufficiency and prior RLE injury - slowly improving - for now will continue current abx and monitor progression.  2. Afib with RVR - new diagnosis -  echocardiogram proves presence of hypertensive cardiomyopathy as the etiology. C/w BB. Added CCB on June 14. Converted back to NSR after an overnight cardizem drip. Started full anticoagulation with Pradaxa 05/13/12. She will f/u with her primary cardiologist in [place], [place] - Dr.Fath  3. Anemia - B12 - 1050, folate 19,  Ferritin 12, - Suggestive for Iron deficiency anemia. Still awaiting hemocult stool. Patient reports history of chronic iron deficiency anemia. Patient reports that her last colonoscopy was about 4 years ago. There is no overt gastrointestinal bleeding 4. DM 2 -uncontrolled .  c/w home meds and added SSI and Lantus  Principal Problem:  *Cellulitis in diabetic foot Active Problems:  DM (diabetes mellitus)  HTN (hypertension)  Hypercholesterolemia  Afib  Anemia  Diastolic dysfunction, left ventricle  Code Status: full  Jamecia Lerman, MD  [facility] Pager [phone]  If 7PM-7AM, please contact night-coverage [email or web address] Password [email or web address] 05/14/2012, 3:07 PM   LOS: 2 days   Procedures  2d echo Left ventricle: The cavity size was normal. Wall thickness was increased in a pattern of moderate to severe LVH. The estimated ejection fraction was 65%. Wall motion was normal; there were no regional wall motion abnormalities. Doppler parameters are consistent with abnormal left ventricular relaxation (grade 1 diastolic dysfunction). - Right ventricle: The cavity size was normal. Systolic function was normal. - Impressions: Patient developed SVT during the  study. Impressions:  Antibiotics:  Vanc 05/12/12  Zosyn 05/12/12  HPI/Subjective: No new events  Objective: Filed Vitals:   05/13/12 2119 05/14/12 0519 05/14/12 1106 05/14/12 1345  BP: 92/50 111/63 100/52 109/62  Pulse: 88 95 98 90  Temp: 98.6 F (37 C) 97.6 F (36.4 C)  98.8 F (37.1 C)  TempSrc: Oral Oral  Oral  Resp: 18 19  18   Height:      Weight:      SpO2: 98% 96%  98%    Intake/Output Summary (Last 24 hours) at 05/14/12 1507 Last data filed at 05/14/12 1345  Gross per 24 hour  Intake    480 ml  Output    325 ml  Net    155 ml    Exam: Alert and oriented x3 Chest clear to auscultation bilaterally Heart regular rate and rhythm without murmurs rubs or gallops Right lower extremity with persistent edema and erythema with minimal improvement as compared to yesterday  Data Reviewed: Basic Metabolic Panel:  Lab 05/14/12 8657 05/13/12 0555 05/12/12 2122  NA 134* 140 138  K 5.2* 3.2* 4.0  CL 104 110 102  CO2 19 20 24   GLUCOSE 256* 223* 249*  BUN 22 14 20   CREATININE 1.48* 0.76 1.04  CALCIUM 8.1* 7.3* 9.1  MG -- -- --  PHOS -- -- --   Liver Function Tests: No results found for this basename: AST:5,ALT:5,ALKPHOS:5,BILITOT:5,PROT:5,ALBUMIN:5 in the last 168 hours No results found for this basename: LIPASE:5,AMYLASE:5 in the last 168 hours No results found for this basename: AMMONIA:5 in the last 168 hours CBC:  Lab 05/14/12 0632 05/13/12 0555 05/12/12 2122  WBC 8.4 5.7 5.5  NEUTROABS -- -- 2.8  HGB 11.9* 10.2* 11.0*  HCT 37.0 31.4* 33.9*  MCV 92.3 92.4 92.9  PLT 181 167 197   Cardiac Enzymes: No results found for this basename: CKTOTAL:5,CKMB:5,CKMBINDEX:5,TROPONINI:5 in the last 168 hours BNP (last 3 results) No results found for this basename: PROBNP:3 in the last 8760 hours CBG:  Lab 05/14/12 1109 05/14/12 0639 05/13/12 2117 05/13/12 1640 05/13/12 1133  GLUCAP 218* 236* 162* 118* 283*    Recent Results (from the past 240 hour(s))  CULTURE,  BLOOD (ROUTINE X 2)     Status: Normal (Preliminary result)   Collection Time   05/12/12  9:26 PM      Component Value Range Status Comment   Specimen Description BLOOD LEFT ARM   Final    Special Requests BOTTLES DRAWN AEROBIC AND ANAEROBIC   Final    Culture  Setup Time [id number]   Final    Culture     Final    Value:        BLOOD CULTURE RECEIVED NO GROWTH TO DATE CULTURE WILL BE HELD FOR 5 DAYS BEFORE ISSUING A FINAL NEGATIVE REPORT   Report Status PENDING   Incomplete   CULTURE, BLOOD (ROUTINE X 2)     Status: Normal (Preliminary result)   Collection Time   05/12/12  9:32 PM      Component Value Range Status Comment   Specimen Description BLOOD LEFT HAND   Final    Special Requests BOTTLES DRAWN AEROBIC ONLY   Final    Culture  Setup Time [id number]   Final    Culture     Final    Value:        BLOOD CULTURE RECEIVED NO GROWTH TO DATE CULTURE WILL BE HELD FOR 5 DAYS BEFORE ISSUING A FINAL NEGATIVE REPORT   Report Status PENDING   Incomplete      Studies: Dg Tibia/fibula Right  05/12/2012  *RADIOLOGY REPORT*  Clinical Data: Right leg swelling  RIGHT TIBIA AND FIBULA - 2 VIEW  Comparison: None.  Findings: Diffuse soft tissue swelling.  Scattered vascular calcifications.  No acute or aggressive osseous abnormality identified.  IMPRESSION: Soft tissue swelling without underlying osseous abnormality identified.  Original Report Authenticated By: Waneta Martins, M.D.   Dg Foot Complete Right  05/12/2012  *RADIOLOGY REPORT*  Clinical Data: Leg swelling, right foot pain status post trauma.  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  The second through fifth digits are flexed at the PIP joints, limiting evaluation.  There is diffuse osteopenia. Diffuse soft tissue swelling. Metatarsal phalangeal degenerative changes.  Due to technique, the Lisfranc joint is poorly visualized on  today's examination.  Plantar calcaneal enthesopathic change. No acute fracture identified.  IMPRESSION: Significantly limited radiograph due to positioning and flexion of the PIP joints.  Diffuse osteopenia and soft tissue swelling.  No displaced fracture identified.  The Lisfranc joint is poorly visualized. If clinical concern for a fracture persists, recommend a repeat radiograph in 5-10 days to evaluate for interval change or callus formation.  Original Report Authenticated By: Waneta Martins, M.D.    Scheduled Meds:    . amitriptyline  75 mg Oral QHS  . aspirin EC  81 mg Oral Daily  . atorvastatin  40 mg Oral q1800  . dabigatran  150 mg Oral Q12H  . diltiazem  120 mg Oral Daily  . ferrous fumarate  1 tablet Oral BID  . insulin aspart  0-20 Units Subcutaneous TID WC  . insulin aspart  0-5 Units Subcutaneous QHS  . insulin aspart  4 Units Subcutaneous TID WC  . insulin glargine  5 Units Subcutaneous QHS  . metFORMIN  1,000 mg Oral BID WC  . metoprolol  50 mg Oral Daily  . pantoprazole  80 mg Oral Q1200  . piperacillin-tazobactam (ZOSYN)  IV  3.375 g Intravenous Q8H  . vancomycin  1,500 mg Intravenous Q24H  . DISCONTD: lisinopril  20 mg Oral Daily   Continuous Infusions:    . sodium chloride 20 mL/hr (05/13/12 1245)

## 2012-05-15 ENCOUNTER — Encounter (HOSPITAL_COMMUNITY): Payer: Self-pay | Admitting: Cardiology

## 2012-05-15 ENCOUNTER — Inpatient Hospital Stay (HOSPITAL_COMMUNITY): Payer: Medicare Other

## 2012-05-15 ENCOUNTER — Other Ambulatory Visit: Payer: Self-pay

## 2012-05-15 DIAGNOSIS — E1169 Type 2 diabetes mellitus with other specified complication: Secondary | ICD-10-CM

## 2012-05-15 DIAGNOSIS — L03039 Cellulitis of unspecified toe: Secondary | ICD-10-CM

## 2012-05-15 DIAGNOSIS — L270 Generalized skin eruption due to drugs and medicaments taken internally: Secondary | ICD-10-CM | POA: Insufficient documentation

## 2012-05-15 DIAGNOSIS — L03119 Cellulitis of unspecified part of limb: Secondary | ICD-10-CM

## 2012-05-15 DIAGNOSIS — R6521 Severe sepsis with septic shock: Secondary | ICD-10-CM | POA: Diagnosis present

## 2012-05-15 DIAGNOSIS — N179 Acute kidney failure, unspecified: Secondary | ICD-10-CM | POA: Diagnosis not present

## 2012-05-15 DIAGNOSIS — A419 Sepsis, unspecified organism: Principal | ICD-10-CM | POA: Diagnosis present

## 2012-05-15 DIAGNOSIS — I4891 Unspecified atrial fibrillation: Secondary | ICD-10-CM

## 2012-05-15 DIAGNOSIS — R6520 Severe sepsis without septic shock: Secondary | ICD-10-CM

## 2012-05-15 DIAGNOSIS — T887XXA Unspecified adverse effect of drug or medicament, initial encounter: Secondary | ICD-10-CM

## 2012-05-15 DIAGNOSIS — L511 Stevens-Johnson syndrome: Secondary | ICD-10-CM | POA: Insufficient documentation

## 2012-05-15 DIAGNOSIS — L02619 Cutaneous abscess of unspecified foot: Secondary | ICD-10-CM

## 2012-05-15 DIAGNOSIS — E1165 Type 2 diabetes mellitus with hyperglycemia: Secondary | ICD-10-CM

## 2012-05-15 HISTORY — DX: Stevens-Johnson syndrome: L51.1

## 2012-05-15 HISTORY — DX: Acute kidney failure, unspecified: N17.9

## 2012-05-15 HISTORY — DX: Generalized skin eruption due to drugs and medicaments taken internally: L27.0

## 2012-05-15 LAB — CARBOXYHEMOGLOBIN
Carboxyhemoglobin: 1.3 % (ref 0.5–1.5)
Methemoglobin: 1.1 % (ref 0.0–1.5)

## 2012-05-15 LAB — TYPE AND SCREEN: Antibody Screen: NEGATIVE

## 2012-05-15 LAB — ABO/RH: ABO/RH(D): O POS

## 2012-05-15 LAB — BASIC METABOLIC PANEL
Calcium: 7.6 mg/dL — ABNORMAL LOW (ref 8.4–10.5)
Calcium: 7.4 mg/dL — ABNORMAL LOW (ref 8.4–10.5)
Creatinine, Ser: 1.48 mg/dL — ABNORMAL HIGH (ref 0.50–1.10)
GFR calc Af Amer: 29 mL/min — ABNORMAL LOW (ref >90)
GFR calc Af Amer: 38 mL/min — ABNORMAL LOW (ref >90)
GFR calc non Af Amer: 25 mL/min — ABNORMAL LOW (ref >90)
GFR calc non Af Amer: 33 mL/min — ABNORMAL LOW (ref >90)
GFR calc non Af Amer: 38 mL/min — ABNORMAL LOW (ref >90)
Glucose, Bld: 245 mg/dL — ABNORMAL HIGH (ref 70–99)
Potassium: 5.3 mEq/L — ABNORMAL HIGH (ref 3.5–5.1)
Sodium: 132 mEq/L — ABNORMAL LOW (ref 135–145)
Sodium: 125 mEq/L — ABNORMAL LOW (ref 135–145)

## 2012-05-15 LAB — CARDIAC PANEL(CRET KIN+CKTOT+MB+TROPI)
CK, MB: 1.4 ng/mL (ref 0.3–4.0)
CK, MB: 1.4 ng/mL (ref 0.3–4.0)
Relative Index: INVALID (ref 0.0–2.5)
Total CK: 38 U/L (ref 7–177)
Total CK: 44 U/L (ref 7–177)
Troponin I: <0.3 ng/mL (ref <0.30)

## 2012-05-15 LAB — URINALYSIS, ROUTINE W REFLEX MICROSCOPIC
Nitrite: NEGATIVE
Specific Gravity, Urine: 1.021 (ref 1.005–1.030)
Urobilinogen, UA: 0.2 mg/dL (ref 0.0–1.0)

## 2012-05-15 LAB — CREATININE, URINE, RANDOM: Creatinine, Urine: 91 mg/dL

## 2012-05-15 LAB — TSH: TSH: 0.306 u[IU]/mL — ABNORMAL LOW (ref 0.350–4.500)

## 2012-05-15 LAB — URINE MICROSCOPIC-ADD ON

## 2012-05-15 LAB — SODIUM, URINE, RANDOM: Sodium, Ur: 82 mEq/L

## 2012-05-15 LAB — CORTISOL: Cortisol, Plasma: 21.9 ug/dL

## 2012-05-15 LAB — POCT I-STAT 3, ART BLOOD GAS (G3+)
Acid-base deficit: 8 mmol/L — ABNORMAL HIGH (ref 0.0–2.0)
O2 Saturation: 95 %
Patient temperature: 103.5
pO2, Arterial: 90 mmHg (ref 80.0–100.0)

## 2012-05-15 LAB — CBC
Hemoglobin: 12 g/dL (ref 12.0–15.0)
RBC: 3.93 MIL/uL (ref 3.87–5.11)

## 2012-05-15 LAB — LACTIC ACID, PLASMA
Lactic Acid, Venous: 1.7 mmol/L (ref 0.5–2.2)
Lactic Acid, Venous: 1.3 mmol/L (ref 0.5–2.2)

## 2012-05-15 LAB — CK: Total CK: 45 U/L (ref 7–177)

## 2012-05-15 LAB — VANCOMYCIN, TROUGH: Vancomycin Tr: 15.4 ug/mL (ref 10.0–20.0)

## 2012-05-15 LAB — GLUCOSE, CAPILLARY
Glucose-Capillary: 242 mg/dL — ABNORMAL HIGH (ref 70–99)
Glucose-Capillary: 302 mg/dL — ABNORMAL HIGH (ref 70–99)

## 2012-05-15 LAB — PROCALCITONIN: Procalcitonin: 0.23 ng/mL

## 2012-05-15 MED ORDER — DILTIAZEM HCL 100 MG IV SOLR
10.0000 mg/h | INTRAVENOUS | Status: DC
  Administered 2012-05-15 (×2): 10 mg/h via INTRAVENOUS
  Filled 2012-05-15: qty 100

## 2012-05-15 MED ORDER — CAMPHOR-MENTHOL 0.5-0.5 % EX LOTN
TOPICAL_LOTION | CUTANEOUS | Status: DC | PRN
  Administered 2012-05-16 – 2012-05-18 (×3): via TOPICAL
  Filled 2012-05-15 (×2): qty 222

## 2012-05-15 MED ORDER — DIPHENHYDRAMINE HCL 50 MG/ML IJ SOLN
12.5000 mg | Freq: Three times a day (TID) | INTRAMUSCULAR | Status: DC | PRN
  Administered 2012-05-16 – 2012-05-17 (×5): 12.5 mg via INTRAVENOUS
  Filled 2012-05-15 (×3): qty 1
  Filled 2012-05-15: qty 9
  Filled 2012-05-15 (×2): qty 1

## 2012-05-15 MED ORDER — SODIUM BICARBONATE 8.4 % IV SOLN
INTRAVENOUS | Status: DC
  Administered 2012-05-15 – 2012-05-16 (×2): via INTRAVENOUS
  Filled 2012-05-15 (×5): qty 150

## 2012-05-15 MED ORDER — SODIUM CHLORIDE 0.9 % IV BOLUS (SEPSIS)
750.0000 mL | Freq: Once | INTRAVENOUS | Status: AC
  Administered 2012-05-15: 750 mL via INTRAVENOUS

## 2012-05-15 MED ORDER — SODIUM CHLORIDE 0.9 % IV SOLN
INTRAVENOUS | Status: DC
  Administered 2012-05-15 (×2): via INTRAVENOUS

## 2012-05-15 MED ORDER — INSULIN ASPART 100 UNIT/ML ~~LOC~~ SOLN
0.0000 [IU] | SUBCUTANEOUS | Status: DC
  Administered 2012-05-15: 11 [IU] via SUBCUTANEOUS

## 2012-05-15 MED ORDER — INSULIN GLARGINE 100 UNIT/ML ~~LOC~~ SOLN
8.0000 [IU] | Freq: Every day | SUBCUTANEOUS | Status: DC
  Administered 2012-05-15 – 2012-05-22 (×8): 8 [IU] via SUBCUTANEOUS

## 2012-05-15 MED ORDER — SODIUM CHLORIDE 0.9 % IV BOLUS (SEPSIS)
500.0000 mL | Freq: Once | INTRAVENOUS | Status: AC
  Administered 2012-05-15: 500 mL via INTRAVENOUS

## 2012-05-15 MED ORDER — LEVOFLOXACIN IN D5W 750 MG/150ML IV SOLN: 750.0000 mg | Freq: Once | INTRAVENOUS | Status: DC

## 2012-05-15 MED ORDER — CLINDAMYCIN PHOSPHATE 900 MG/50ML IV SOLN
900.0000 mg | Freq: Three times a day (TID) | INTRAVENOUS | Status: DC
  Filled 2012-05-15: qty 50

## 2012-05-15 MED ORDER — DOBUTAMINE IN D5W 4-5 MG/ML-% IV SOLN: 2.5000 ug/kg/min | INTRAVENOUS | Status: DC | PRN

## 2012-05-15 MED ORDER — INSULIN ASPART 100 UNIT/ML ~~LOC~~ SOLN
0.0000 [IU] | SUBCUTANEOUS | Status: DC
  Administered 2012-05-15: 15 [IU] via SUBCUTANEOUS
  Administered 2012-05-16: 4 [IU] via SUBCUTANEOUS
  Administered 2012-05-16 (×3): 7 [IU] via SUBCUTANEOUS
  Administered 2012-05-16 – 2012-05-17 (×4): 4 [IU] via SUBCUTANEOUS
  Administered 2012-05-17: 3 [IU] via SUBCUTANEOUS
  Administered 2012-05-17: 4 [IU] via SUBCUTANEOUS
  Administered 2012-05-17 (×2): 3 [IU] via SUBCUTANEOUS
  Administered 2012-05-18: 4 [IU] via SUBCUTANEOUS
  Administered 2012-05-18: 3 [IU] via SUBCUTANEOUS
  Administered 2012-05-18: 4 [IU] via SUBCUTANEOUS
  Administered 2012-05-18: 3 [IU] via SUBCUTANEOUS
  Administered 2012-05-18 – 2012-05-19 (×4): 4 [IU] via SUBCUTANEOUS
  Administered 2012-05-19 – 2012-05-20 (×4): 3 [IU] via SUBCUTANEOUS
  Administered 2012-05-20: 7 [IU] via SUBCUTANEOUS
  Administered 2012-05-20: 3 [IU] via SUBCUTANEOUS
  Administered 2012-05-21: 4 [IU] via SUBCUTANEOUS
  Administered 2012-05-21 (×2): 3 [IU] via SUBCUTANEOUS

## 2012-05-15 MED ORDER — NOREPINEPHRINE BITARTRATE 1 MG/ML IJ SOLN: 2.0000 ug/min | INTRAVENOUS | Status: DC | PRN

## 2012-05-15 MED ORDER — CLINDAMYCIN PHOSPHATE 900 MG/50ML IV SOLN
900.0000 mg | Freq: Four times a day (QID) | INTRAVENOUS | Status: DC
  Administered 2012-05-15: 900 mg via INTRAVENOUS
  Filled 2012-05-15 (×2): qty 50

## 2012-05-15 MED ORDER — SODIUM CHLORIDE 0.9 % IV SOLN
INTRAVENOUS | Status: DC | PRN
  Administered 2012-05-15: 19:00:00 via INTRAVENOUS

## 2012-05-15 MED ORDER — DIPHENHYDRAMINE HCL 25 MG PO CAPS: 25.0000 mg | ORAL_CAPSULE | Freq: Four times a day (QID) | ORAL | Status: DC | PRN

## 2012-05-15 MED ORDER — AMIODARONE HCL IN DEXTROSE 360-4.14 MG/200ML-% IV SOLN
INTRAVENOUS | Status: AC
  Administered 2012-05-15: 200 mL via INTRAVENOUS
  Filled 2012-05-15: qty 200

## 2012-05-15 MED ORDER — HEPARIN (PORCINE) IN NACL 100-0.45 UNIT/ML-% IJ SOLN
1300.0000 [IU]/h | INTRAMUSCULAR | Status: DC
  Administered 2012-05-15: 1050 [IU]/h via INTRAVENOUS
  Administered 2012-05-16 – 2012-05-19 (×4): 1300 [IU]/h via INTRAVENOUS
  Filled 2012-05-15 (×6): qty 250

## 2012-05-15 MED ORDER — CIPROFLOXACIN IN D5W 400 MG/200ML IV SOLN
400.0000 mg | INTRAVENOUS | Status: DC
  Administered 2012-05-15 – 2012-05-16 (×2): 400 mg via INTRAVENOUS
  Filled 2012-05-15 (×2): qty 200

## 2012-05-15 MED ORDER — AMIODARONE HCL IN DEXTROSE 360-4.14 MG/200ML-% IV SOLN
60.0000 mg/h | INTRAVENOUS | Status: DC
  Administered 2012-05-15: 60 mg/h via INTRAVENOUS
  Administered 2012-05-15 (×2): 200 mL via INTRAVENOUS
  Administered 2012-05-15: 30 mg/h via INTRAVENOUS
  Filled 2012-05-15 (×5): qty 200

## 2012-05-15 MED ORDER — CLINDAMYCIN PHOSPHATE 900 MG/50ML IV SOLN
900.0000 mg | Freq: Three times a day (TID) | INTRAVENOUS | Status: DC
  Administered 2012-05-15 – 2012-05-16 (×3): 900 mg via INTRAVENOUS
  Filled 2012-05-15 (×5): qty 50

## 2012-05-15 MED ORDER — LINEZOLID 2 MG/ML IV SOLN
600.0000 mg | Freq: Two times a day (BID) | INTRAVENOUS | Status: DC
  Administered 2012-05-15 – 2012-05-19 (×10): 600 mg via INTRAVENOUS
  Filled 2012-05-15 (×13): qty 300

## 2012-05-15 MED ORDER — SODIUM CHLORIDE 0.9 % IV BOLUS (SEPSIS): 500.0000 mL | INTRAVENOUS | Status: DC | PRN

## 2012-05-15 MED ORDER — SODIUM CHLORIDE 0.9 % IV BOLUS (SEPSIS)
25.0000 mL/kg | Freq: Once | INTRAVENOUS | Status: AC
  Administered 2012-05-15: 2113 mL via INTRAVENOUS

## 2012-05-15 MED ORDER — DIGOXIN 0.25 MG/ML IJ SOLN
0.2500 mg | Freq: Four times a day (QID) | INTRAMUSCULAR | Status: AC
  Administered 2012-05-15 – 2012-05-16 (×2): 0.25 mg via INTRAVENOUS
  Filled 2012-05-15 (×2): qty 1

## 2012-05-15 MED ORDER — PHENYLEPHRINE HCL 10 MG/ML IJ SOLN
30.0000 ug/min | INTRAMUSCULAR | Status: DC | PRN
  Administered 2012-05-15: 12 ug/min via INTRAVENOUS
  Administered 2012-05-15: 30 ug/min via INTRAVENOUS
  Administered 2012-05-16: 80 ug/min via INTRAVENOUS
  Administered 2012-05-16: 50 ug/min via INTRAVENOUS
  Filled 2012-05-15 (×3): qty 1

## 2012-05-15 MED ORDER — DABIGATRAN ETEXILATE MESYLATE 75 MG PO CAPS
75.0000 mg | ORAL_CAPSULE | Freq: Two times a day (BID) | ORAL | Status: DC
  Filled 2012-05-15 (×2): qty 1

## 2012-05-15 MED ORDER — SODIUM CHLORIDE 0.9 % IV SOLN
340.0000 mg | INTRAVENOUS | Status: DC
  Filled 2012-05-15: qty 6.8

## 2012-05-15 NOTE — Progress Notes (Signed)
[facility] PROGRESS NOTE  Pamela [name] ZOX:[id number] DOB: [birth year] DOA: 05/12/2012 PCP: No primary provider on file. PCP: Veneda Melter in [place], [place] Assessment/Plan: 1. Severe Cellulitis of the RLE on a background of chronic venous insufficiency and prior RLE injury - slowly improving - for now will continue current abx and monitor progression. Started on Vancomycin and Zosyn on 05/12/12 - until 05/14/12 . Developed ARF and itching ? Allergic reaction. Switch to Cubicin on 05/15/12  2. Acute renal failure with hyperkalemia and acidosis on 05/15/12 - ? From abx or due to relative hypotension from meds acei . Lisinopril stopped on 05/14/12. Stat UA - need to mnitor closely UO 3. Afib with RVR - new diagnosis -  echocardiogram proves presence of hypertensive cardiomyopathy as the etiology. C/w BB. Added CCB on June 14. Converted back to NSR after an overnight cardizem drip. Started full anticoagulation with Pradaxa 05/13/12.  4. Atrial flutter on 05/15/12 - BP into the 130s - no hemodyn instability. We shall consult cardiology 5. Anemia - B12 - 1050, folate 19,  Ferritin 12, - Suggestive for Iron deficiency anemia. Still awaiting hemocult stool. Patient reports history of chronic iron deficiency anemia. Patient reports that her last colonoscopy was about 4 years ago. There is no overt gastrointestinal bleeding 6. DM 2 -uncontrolled .  c/w home meds and added SSI and Lantus  Principal Problem:  *Cellulitis in diabetic foot Active Problems:  DM (diabetes mellitus)  HTN (hypertension)  Hypercholesterolemia  Afib  Anemia  Diastolic dysfunction, left ventricle  Code Status: full  Donovin Kraemer, MD  [facility] Pager [phone]  If 7PM-7AM, please contact night-coverage [email or web address] Password [email or web address] 05/15/2012, 8:40 AM   LOS: 3 days   Procedures  2d echo Left ventricle: The cavity size was normal. Wall thickness was increased in a pattern of moderate to severe LVH.  The estimated ejection fraction was 65%. Wall motion was normal; there were no regional wall motion abnormalities. Doppler parameters are consistent with abnormal left ventricular relaxation (grade 1 diastolic dysfunction). - Right ventricle: The cavity size was normal. Systolic function was normal.    Venous doppler - negative for DVT   Antibiotics:  Vanc 05/12/12 - 6/16  Zosyn 05/12/12- 6/16  Cubicin 6/16  HPI/Subjective: lethargic  Objective: Filed Vitals:   05/14/12 0519 05/14/12 1106 05/14/12 1345 05/14/12 2103  BP: 111/63 100/52 109/62 129/65  Pulse: 95 98 90 67  Temp: 97.6 F (36.4 C)  98.8 F (37.1 C) 100.1 F (37.8 C)  TempSrc: Oral  Oral Oral  Resp: 19  18 19   Height:      Weight:      SpO2: 96%  98% 96%    Intake/Output Summary (Last 24 hours) at 05/15/12 0840 Last data filed at 05/14/12 1345  Gross per 24 hour  Intake    480 ml  Output      0 ml  Net    480 ml    Exam:  Arouses and follows command oreinetd x3 CVS: tachy regular RS : ctab rle : looking more red and with edema  Data Reviewed: Basic Metabolic Panel:  Lab 05/15/12 8295 05/14/12 0632 05/13/12 0555 05/12/12 2122  NA 132* 134* 140 138  K 5.3* 5.2* 3.2* 4.0  CL 101 104 110 102  CO2 16* 19 20 24   GLUCOSE 245* 256* 223* 249*  BUN 33* 22 14 20   CREATININE 1.89* 1.48* 0.76 1.04  CALCIUM 7.9* 8.1* 7.3* 9.1  MG -- -- -- --  PHOS -- -- -- --  Liver Function Tests: No results found for this basename: AST:5,ALT:5,ALKPHOS:5,BILITOT:5,PROT:5,ALBUMIN:5 in the last 168 hours No results found for this basename: LIPASE:5,AMYLASE:5 in the last 168 hours No results found for this basename: AMMONIA:5 in the last 168 hours CBC:  Lab 05/15/12 0620 05/14/12 0632 05/13/12 0555 05/12/12 2122  WBC 11.6* 8.4 5.7 5.5  NEUTROABS -- -- -- 2.8  HGB 12.0 11.9* 10.2* 11.0*  HCT 35.8* 37.0 31.4* 33.9*  MCV 91.1 92.3 92.4 92.9  PLT 214 181 167 197   Cardiac Enzymes: No results found for this  basename: CKTOTAL:5,CKMB:5,CKMBINDEX:5,TROPONINI:5 in the last 168 hours BNP (last 3 results) No results found for this basename: PROBNP:3 in the last 8760 hours CBG:  Lab 05/15/12 0632 05/14/12 2150 05/14/12 1616 05/14/12 1109 05/14/12 0639  GLUCAP 242* 173* 135* 218* 236*    Recent Results (from the past 240 hour(s))  CULTURE, BLOOD (ROUTINE X 2)     Status: Normal (Preliminary result)   Collection Time   05/12/12  9:26 PM      Component Value Range Status Comment   Specimen Description BLOOD LEFT ARM   Final    Special Requests BOTTLES DRAWN AEROBIC AND ANAEROBIC   Final    Culture  Setup Time [id number]   Final    Culture     Final    Value:        BLOOD CULTURE RECEIVED NO GROWTH TO DATE CULTURE WILL BE HELD FOR 5 DAYS BEFORE ISSUING A FINAL NEGATIVE REPORT   Report Status PENDING   Incomplete   CULTURE, BLOOD (ROUTINE X 2)     Status: Normal (Preliminary result)   Collection Time   05/12/12  9:32 PM      Component Value Range Status Comment   Specimen Description BLOOD LEFT HAND   Final    Special Requests BOTTLES DRAWN AEROBIC ONLY   Final    Culture  Setup Time [id number]   Final    Culture     Final    Value:        BLOOD CULTURE RECEIVED NO GROWTH TO DATE CULTURE WILL BE HELD FOR 5 DAYS BEFORE ISSUING A FINAL NEGATIVE REPORT   Report Status PENDING   Incomplete      Studies: No results found.  Scheduled Meds:    . amitriptyline  75 mg Oral QHS  . aspirin EC  81 mg Oral Daily  . atorvastatin  40 mg Oral q1800  . dabigatran  75 mg Oral Q12H  . ferrous fumarate  1 tablet Oral BID  . insulin aspart  0-20 Units Subcutaneous TID WC  . insulin aspart  0-5 Units Subcutaneous QHS  . insulin aspart  4 Units Subcutaneous TID WC  . insulin glargine  8 Units Subcutaneous QHS  . metoprolol  50 mg Oral Daily  . pantoprazole  80 mg Oral Q1200  . DISCONTD: dabigatran  150 mg Oral Q12H  . DISCONTD: diltiazem  120 mg Oral Daily  . DISCONTD: insulin glargine  5  Units Subcutaneous QHS  . DISCONTD: lisinopril  20 mg Oral Daily  . DISCONTD: metFORMIN  1,000 mg Oral BID WC  . DISCONTD: piperacillin-tazobactam (ZOSYN)  IV  3.375 g Intravenous Q8H  . DISCONTD: vancomycin  1,500 mg Intravenous Q24H   Continuous Infusions:    . sodium chloride 20 mL/hr (05/13/12 1245)

## 2012-05-15 NOTE — Progress Notes (Signed)
eLink Physician-Brief Progress Note Patient Name: Pamela [name] DOB: March 14, 1934 MRN: [id number]  Date of Service  05/15/2012   HPI/Events of Note   Persistent tachycardia, despite amiodarone drip  eICU Interventions  Dig load for renal impaired.   Intervention Category Major Interventions: Arrhythmia - evaluation and management  Sheresa Cullop J 05/15/2012, 6:09 PM

## 2012-05-15 NOTE — Consult Note (Signed)
Name: Pamela [name] MRN: [id number] DOB: 08-11-1934    LOS: 3  Referring Provider:  laza Reason for Referral:  Acidosis, mental status change  PULMONARY / CRITICAL CARE MEDICINE  HPI:   78 y.o. female with history of diabetes, hypertension, with right foot ulcer managed by the podiatrist, presented to the emergency room with increased redness and swelling on 6/13. She has no fever or chills, headache, nausea, vomiting or any other symptomology. Evaluation in emergency room included a normal white count of 5.5 thousand, hemoglobin of 11 g per decaliter, blood sugar of 249 with normal renal function tests. Plain film of the distal tibia fibula revealed no osseous abnormality. In the emergency room, she developed atrial fibrillation with rapid ventricular rate of 150. She stated she has intermittently felt this way before. She was admitted by IM service. Cultures sent. Treatment has included: IVFs, empiric antibiotics and supportive care. Initially it appeared as though she was improving clinically, with the exception of slight bump in her creatinine. On 6/16 she was found to be more somnolent, had developed progressive renal failure, diffuse rash and urticaria, as well as metabolic acidosis, hyperkalemia and AF w/ RVR. Her RLE appeared to have become more erythremic and swelling had progressed. She was moved to the ICU. PCCM was asked to eval given concern for sepsis and progressive metabolic acidosis.     Past Medical History  Diagnosis Date  . Diabetes mellitus   . Hypertension   . Dysrhythmia    Past Surgical History  Procedure Date  . Toe amputation   . Tonsillectomy   . Abdominal hysterectomy    Prior to Admission medications   Medication Sig Start Date End Date Taking? Authorizing Provider  amitriptyline (ELAVIL) 75 MG tablet Take 75 mg by mouth at bedtime.   Yes Historical Provider, MD  atorvastatin (LIPITOR) 40 MG tablet Take 40 mg by mouth daily.   Yes Historical Provider, MD    esomeprazole (NEXIUM) 40 MG capsule Take 40 mg by mouth daily before breakfast.   Yes Historical Provider, MD  lisinopril (PRINIVIL,ZESTRIL) 20 MG tablet Take 20 mg by mouth daily.   Yes Historical Provider, MD  metFORMIN (GLUCOPHAGE) 1000 MG tablet Take 1,000 mg by mouth 2 (two) times daily.   Yes Historical Provider, MD  metoprolol (LOPRESSOR) 50 MG tablet Take 50 mg by mouth daily.   Yes Historical Provider, MD  nabumetone (RELAFEN) 500 MG tablet Take 500 mg by mouth 2 (two) times daily as needed. Pain   Yes Historical Provider, MD  Tamsulosin HCl (FLOMAX) 0.4 MG CAPS Take 0.4 mg by mouth daily.   Yes Historical Provider, MD  ciprofloxacin (CIPRO) 500 MG tablet Take 1 tablet (500 mg total) by mouth 2 (two) times daily. 05/12/12 05/22/12  Glynn Octave, MD  ondansetron (ZOFRAN) 4 MG tablet Take 1 tablet (4 mg total) by mouth every 6 (six) hours. 05/12/12 05/19/12  Glynn Octave, MD   Allergies Allergies  Allergen Reactions  . Sulfa Antibiotics     unknown    Family History History reviewed. No pertinent family history. Social History  reports that she has never smoked. She does not have any smokeless tobacco history on file. She reports that she does not drink alcohol. Her drug history not on file.  Review Of Systems:  unable  Brief patient description:   78 y.o admitted 6/13 w/ RLE cellulitis, sepsis, and SVT. Initially it appeared as though she was improving clinically, with the exception of slight bump in her  creatinine. On 6/16 moved to ICU as found more somnolent, had developed progressive renal failure, diffuse rash and urticaria, as well as metabolic acidosis, hyperkalemia and AF w/ RVR. Her RLE appeared to have become more erythremic and swelling had progressed, also she had developed a diffuse rash. PCCM was asked to eval given concern for sepsis and progressive metabolic acidosis and also concern for possible adverse drug reaction to her antibiotic therapy.    Events Since  Admission: Moved to ICU, placed on amiodarone gtt  Current Status: critical Vital Signs: Temp:  [98.2 F (36.8 C)-103.5 F (39.7 C)] 103.5 F (39.7 C) (06/16 1113) Pulse Rate:  [67-164] 164  (06/16 1106) Resp:  [18-19] 19  (06/15 2103) BP: (109-152)/(62-82) 152/70 mmHg (06/16 1106) SpO2:  [94 %-98 %] 94 % (06/16 1106)  Physical Examination: General:  Acutely ill appearing female. Lethargic and slow to respond Neuro:  Drowsy, lethargic. Oriented  HEENT:  MM are dry, No JVD Cardiovascular:  Tachy irreg irreg  Lungs:  Clear, decreased Abdomen:  Soft, non-tender, No OM, hypoactive Musculoskeletal:  RLE is swollen/edematous, erythremic, warm to touch. It appears that the erythema has worsened.  Skin:  RLE is swollen, warm to touch, and erythremic. She has developed a diffuse rash over her entire body  Principal Problem:  *Cellulitis in diabetic foot Active Problems:  DM (diabetes mellitus)  HTN (hypertension)  Hypercholesterolemia  Afib, paroxysmal, with RVR at times with BBB  Anemia  Diastolic dysfunction, left ventricle   ASSESSMENT AND PLAN  PULMONARY  Lab 05/15/12 1152  PHART 7.325*  PCO2ART 32.1*  PO2ART 90.0  HCO3 16.3*  O2SAT 95.0   Ventilator Settings:   CXR:  No infiltrates. CVL in good position ETT:    A:  No acute pulmonary pathology. Currently her respiratory system is doing a good job compensating for her metabolic acidosis. She is at risk for decline should her MS worsen P:   -pulse ox -FIO2  -f/u am cxr, as this is the only film to eval this hazzy LLL abg reviewed, may need repeat  CARDIOVASCULAR  Lab 05/15/12 1015  TROPONINI --  LATICACIDVEN 3.2*  PROBNP --   ECG:  Af w/ RVR, wide complex QRS Lines:  Right IJ CVL 6/16>>>  ECHO 6/16: Left ventricle: The cavity size was normal. Wall thickness was increased in a pattern of moderate to severe LVH. The estimated ejection fraction was 65%. Wall motion was normal; there were no regional  wall motion abnormalities. Doppler parameters are consistent with abnormal left ventricular relaxation (grade 1 diastolic dysfunction). - Right ventricle: The cavity size was normal. Systolic function was normal.   A: AF w/ RVR, has wide QRS. Cards following. Suspect that this is a response to her acute illness, as she has been in and out of AF. No doubt also being driven by fever.  P:  -amio drip per cards -central access to assess CVP: gaol 8-12 -if needs pressors would favor neo: to avoid worsening tachycardia -will stop pradaxa for now given renal injury -have pharmacy dose heparin -repeat ekg in am  A:SIRS/Sepsis. Currently has good SBP, w/ lactate boarder line at 3.2.  P: -fluid challenge -repeat lactate, if has not had 20% clearance or if SBP drops, will start sepsis protocol -stop CCB Update: Likely will need EGDT, follow bolus response and treat fever -cortisol , may need stress steroids STAT source control  RENAL  Lab 05/15/12 0620 05/14/12 0632 05/13/12 0555 05/12/12 2122  NA 132* 134* 140 138  K 5.3* 5.2* -- --  CL 101 104 110 102  CO2 16* 19 20 24   BUN 33* 22 14 20   CREATININE 1.89* 1.48* 0.76 1.04  CALCIUM 7.9* 8.1* 7.3* 9.1  MG -- -- -- --  PHOS -- -- -- --   Intake/Output      06/15 0701 - 06/16 0700 06/16 0701 - 06/17 0700   P.O. 480    Total Intake(mL/kg) 480 (5.7)    Urine (mL/kg/hr)     Total Output     Net +480         Urine Occurrence 2 x     Foley:    A: Acute renal Failure: unclear if this is ATN in setting of sepsis and Ace-I, due to possible nephritis s/p strep, or possibly drug induced after abx ?r/o IN,  nephro consulted.  May be intravascular depleted P: -send urine studies -check renal US -avoid nephro-toxins -vanc and zosyn stopped -renal dose meds -f/u chemistry -urine eos -volume  A:Metabolic Acidosis Both non-anion gap/and anion gap: prob due to RVR and her AKI.  P: -bicarb gtt started -close obs of  chemistry  A:hyperkalemia P: -stop ace I, d/c any K supplementation -bicarb gtt for NON AG, re eval at 1 liter -repeat chemistry  GASTROINTESTINAL No results found for this basename: AST:5,ALT:5,ALKPHOS:5,BILITOT:5,PROT:5,ALBUMIN:5 in the last 168 hours  A:  No Acute issues P:   -SUP ordered. -will keep NPO for now except meds if MS will allow her to take  HEMATOLOGIC  Lab 05/15/12 0620 05/14/12 0632 05/13/12 0555 05/12/12 2122  HGB 12.0 11.9* 10.2* 11.0*  HCT 35.8* 37.0 31.4* 33.9*  PLT 214 181 167 197  INR -- -- 1.18 --  APTT -- -- -- --   A: Has moderate Anemia at baseline. Hgb of 12 may reflect hemoconcentration, as she has third spaced significantly and may still be vascularly dry P:  -trend CBC -transfuse for hgb Z< 7, unless she develops shock  INFECTIOUS  Lab 05/15/12 1015 05/15/12 0620 05/14/12 0632 05/13/12 0555 05/12/12 2122  WBC -- 11.6* 8.4 5.7 5.5  PROCALCITON 0.23 -- -- -- --   Cultures: 6/13 BCX2 6/13>>> BCX2 6/16>>>  Antibiotics: vanc 6/13>>>6/16 (rash) Zosyn 6/13>>>6/16 (rash) clinda (cellulitis) 6/16>>> cipro (cellulitis) 6/16>>> zyvox (cellulitis) 6/16>>> A:   LE Cellulitis w/ resulting severe sepsis. Her lactate is 3.2 but currently systolic SBP holding. Need further eval of LE to r/o progression of infection.Elita Boone about strep group, r/o nec fasc P:   -see abx noted above -cultures sent -trend PCT -CT LE to look for gas formation, STAT clinda for toxin inh  ENDOCRINE  Lab 05/15/12 0632 05/14/12 2150 05/14/12 1616 05/14/12 1109 05/14/12 0639  GLUCAP 242* 173* 135* 218* 236*   A: DM/ Hyperglycemia P:   -ssi  NEUROLOGIC  A:  Acute Encephalopathy: prob due to sepsis, fever and renal failure.  P:   -supportive care  Derm: Diffuse erythemic rash: ? Drugs ? Sepsis.  P: -vanc and zosyn stopped, although I am more impressed that this is sepsis related . Rash may be toxic shock etc -monitor, may need to record allergy, avoid for  now   BEST PRACTICE / DISPOSITION Level of Care:  icu Primary Service:  pccm Consultants:  Cards and nephro Code Status:  full Diet:  npo DVT Px:  Heparin gtt GI Px:  ppi Skin Integrity:  Diffuse rash Social / Family:  Updated by med svc  [name],[name] 05/15/2012, 12:46 PM  Ccm time 60  min   I have fully examined this patient and agree with above findings.    And edited in full  [name]. Tyson Alias, MD, FACP Pgr: [phone] [facility]

## 2012-05-15 NOTE — Procedures (Signed)
Central Venous Catheter Insertion Procedure Note Pamela [name] [id number] 09/28/1934  Procedure: Insertion of Central Venous Catheter Indications: Assessment of intravascular volume, Drug and/or fluid administration and Frequent blood sampling  Procedure Details Consent: Risks of procedure as well as the alternatives and risks of each were explained to the (patient/caregiver).  Consent for procedure obtained. Time Out: Verified patient identification, verified procedure, site/side was marked, verified correct patient position, special equipment/implants available, medications/allergies/relevent history reviewed, required imaging and test results available.  Performed Real time [place] used to ID and cannulate the right IJ.  Maximum sterile technique was used including antiseptics, cap, gloves, gown, hand hygiene, mask and sheet. Skin prep: Chlorhexidine; local anesthetic administered A antimicrobial bonded/coated triple lumen catheter was placed in the right internal jugular vein using the Seldinger technique.  Evaluation Blood flow good Complications: No apparent complications Patient did tolerate procedure well. Chest X-ray ordered to verify placement.  CXR: normal.  Pamela [name] 05/15/2012, 1:43 PM   Tolerated well Consented pt Severe sepsis Risk bleeding explained [place] Pamela [name]. Pamela Alias, MD, FACP Pgr: [phone] [facility]

## 2012-05-15 NOTE — Progress Notes (Signed)
Noticed this morning on assessment that patient was more drowsy than yesterday. Patient was slurring her words. Patient continued to have runs of Vtach last night and this morning. Notified Dr. Lavera Guise. Orders for UA and foley placement for strict I/O's. Urine dark amber color.  @ 1041  Temp was 98.5 oral, BP 135/82, HR 144.  @ 1106 98.2 oral, BP 152/70, HR 164, 94% RA. @ 1113 temp rectal 103.5. Cardiology ordered card drip and transfer to ICU. Transferred patient via bed to 2906 with telemetry. Patient was AFIB with BB when we got to ICU. Gave bedside report to [facility].

## 2012-05-15 NOTE — Consult Note (Signed)
Pamela [name] 05/15/2012 [employer] D Requesting Physician:  Dr.  Lavera Guise  Reason for Consult:  Acute renal failure The patient is a 78 y.o. year-old female with hx of DM2, HTN who presented with severe cellulitis of RLE. She had a known hx of chronic venous insufficiency and prior RLE injury. She was treated initially with IV Vancomycin and IV Zosyn.  Scr was 1.04 on admit 6/13.  Improved on 6/14 to  0.78, then worsened to 1.49 on 6/15.  She had low BP episodes specifically on 6/14 in the evening (92/50) and 6/15 at 11 am (100/52), this occurred in the setting of IV dilt bolus and IV drips and po dilt 120mg  (for afib/RVR rate control) as well as lisinopril 20 and metoprolol 50, all of which she received on 6/14.  ACEI was d/c'd after second dose on 6/15, BB has been held since 6/14, and po dilt was stopped today.  BP is better and creatinine, after rising to 1.84 this am, is [name] to 1.48 this afternoon, with improved UOP.    Today, also, patient has developed an acute rash which was first noticed I believe this morning, and has progressed thru the day.  Vanc and Zosyn have been stopped as of yest/today, and today she has been ordered to start on IV cipro, flagyl and Zyvox.   IV amio has been started. Also getting IV neosynephrine for low BP now. IV dilt was stopped today at 1 pm as amio was being started. CVP is 8 and UOP is good (1225 today) on IV bicarb and NS drips.    Patient is without SOB. She reports pain in RLE and subj fevers.     Past Medical History:  Past Medical History  Diagnosis Date  . Diabetes mellitus   . Hypertension   . Dysrhythmia     Past Surgical History:  Past Surgical History  Procedure Date  . Toe amputation   . Tonsillectomy   . Abdominal hysterectomy     Family History: History reviewed. No pertinent family history. Social History:  reports that she has never smoked. She does not have any smokeless tobacco history on file. She reports that she does not  drink alcohol. Her drug history not on file.  Allergies:  Allergies  Allergen Reactions  . Sulfa Antibiotics     unknown    Home medications: Prior to Admission medications   Medication Sig Start Date End Date Taking? Authorizing Provider  amitriptyline (ELAVIL) 75 MG tablet Take 75 mg by mouth at bedtime.   Yes Historical Provider, MD  atorvastatin (LIPITOR) 40 MG tablet Take 40 mg by mouth daily.   Yes Historical Provider, MD  esomeprazole (NEXIUM) 40 MG capsule Take 40 mg by mouth daily before breakfast.   Yes Historical Provider, MD  lisinopril (PRINIVIL,ZESTRIL) 20 MG tablet Take 20 mg by mouth daily.   Yes Historical Provider, MD  metFORMIN (GLUCOPHAGE) 1000 MG tablet Take 1,000 mg by mouth 2 (two) times daily.   Yes Historical Provider, MD  metoprolol (LOPRESSOR) 50 MG tablet Take 50 mg by mouth daily.   Yes Historical Provider, MD  nabumetone (RELAFEN) 500 MG tablet Take 500 mg by mouth 2 (two) times daily as needed. Pain   Yes Historical Provider, MD  Tamsulosin HCl (FLOMAX) 0.4 MG CAPS Take 0.4 mg by mouth daily.   Yes Historical Provider, MD  ciprofloxacin (CIPRO) 500 MG tablet Take 1 tablet (500 mg total) by mouth 2 (two) times daily. 05/12/12 05/22/12  Jeannett Senior  Rancour, MD  ondansetron (ZOFRAN) 4 MG tablet Take 1 tablet (4 mg total) by mouth every 6 (six) hours. 05/12/12 05/19/12  Glynn Octave, MD    Inpatient medications:    . amiodarone (NEXTERONE PREMIX) 360 mg/200 mL dextrose      . atorvastatin  40 mg Oral q1800  . ciprofloxacin  400 mg Intravenous Q24H  . clindamycin (CLEOCIN) IV  900 mg Intravenous Q8H  . digoxin  0.25 mg Intravenous Q6H  . insulin aspart  0-20 Units Subcutaneous Q4H  . insulin aspart  4 Units Subcutaneous TID WC  . insulin glargine  8 Units Subcutaneous QHS  . linezolid  600 mg Intravenous Q12H  . sodium chloride  25 mL/kg Intravenous Once  . sodium chloride  500 mL Intravenous Once  . sodium chloride  500 mL Intravenous Once  . sodium  chloride  750 mL Intravenous Once  . DISCONTD: amitriptyline  75 mg Oral QHS  . DISCONTD: aspirin EC  81 mg Oral Daily  . DISCONTD: clindamycin (CLEOCIN) IV  900 mg Intravenous Q6H  . DISCONTD: clindamycin (CLEOCIN) IV  900 mg Intravenous Q8H  . DISCONTD: dabigatran  150 mg Oral Q12H  . DISCONTD: dabigatran  75 mg Oral Q12H  . DISCONTD: DAPTOmycin (CUBICIN)  IV  340 mg Intravenous Q48H  . DISCONTD: diltiazem  120 mg Oral Daily  . DISCONTD: ferrous fumarate  1 tablet Oral BID  . DISCONTD: insulin aspart  0-20 Units Subcutaneous TID WC  . DISCONTD: insulin aspart  0-20 Units Subcutaneous Q4H  . DISCONTD: insulin aspart  0-5 Units Subcutaneous QHS  . DISCONTD: insulin glargine  5 Units Subcutaneous QHS  . DISCONTD: levofloxacin (LEVAQUIN) IV  750 mg Intravenous Once  . DISCONTD: metFORMIN  1,000 mg Oral BID WC  . DISCONTD: metoprolol  50 mg Oral Daily  . DISCONTD: pantoprazole  80 mg Oral Q1200  . DISCONTD: piperacillin-tazobactam (ZOSYN)  IV  3.375 g Intravenous Q8H  . DISCONTD: vancomycin  1,500 mg Intravenous Q24H    Review of Systems Gen:  Denies headache, fever, chills, sweats.  No weight loss. HEENT:  No visual change, sore throat, difficulty swallowing. Resp:  No difficulty breathing, DOE.  No cough or hemoptysis. Cardiac:  No chest pain, orthopnea, PND.  Denies edema. GI:   Denies abdominal pain.   No nausea, vomiting, diarrhea.  No constipation. GU:  Denies difficulty or change in voiding.  Urine grossly bloody. Foley in place.     MS:  Denies joint pain or swelling.   Derm:  See above.  Neuro:   Denies focal weakness, memory problems, hx stroke or TIA.   Psych:  Denies symptoms of depression of anxiety.  No hallucination.    Labs: Basic Metabolic Panel:  Lab 05/15/12 4098 05/15/12 0620 05/14/12 1191 05/13/12 0555 05/12/12 2122  NA 129* 132* 134* 140 138  K 4.5 5.3* 5.2* 3.2* 4.0  CL 99 101 104 110 102  CO2 17* 16* 19 20 24   GLUCOSE 275* 245* 256* 223* 249*  BUN 29*  33* 22 14 20   CREATININE 1.48* 1.89* 1.48* 0.76 1.04  ALB -- -- -- -- --  CALCIUM 7.6* 7.9* 8.1* 7.3* 9.1  PHOS -- -- -- -- --   Liver Function Tests: No results found for this basename: AST:3,ALT:3,ALKPHOS:3,BILITOT:3,PROT:3,ALBUMIN:3 in the last 168 hours No results found for this basename: LIPASE:3,AMYLASE:3 in the last 168 hours No results found for this basename: AMMONIA:3 in the last 168 hours CBC:  Lab 05/15/12 0620  05/14/12 2956 05/13/12 0555 05/12/12 2122  WBC 11.6* 8.4 5.7 5.5  NEUTROABS -- -- -- 2.8  HGB 12.0 11.9* 10.2* 11.0*  HCT 35.8* 37.0 31.4* 33.9*  MCV 91.1 92.3 92.4 92.9  PLT 214 181 167 197   PT/INR: @labrcntip (inr:5) Cardiac Enzymes:  Lab 05/15/12 1830 05/15/12 1500 05/15/12 1125 05/15/12 1015  CKTOTAL 44 38 41 45  CKMB 1.4 1.4 0.9 --  CKMBINDEX -- -- -- --  TROPONINI <0.30 <0.30 <0.30 --   CBG:  Lab 05/15/12 0632 05/14/12 2150 05/14/12 1616 05/14/12 1109 05/14/12 0639  GLUCAP 242* 173* 135* 218* 236*    Iron Studies:  Lab 05/13/12 1330  IRON 59  TIBC 319  TRANSFERRIN --  FERRITIN 12    Xrays/Other Studies: Ct Tibia Fibula Right Wo Contrast  05/15/2012  *RADIOLOGY REPORT*  Clinical Data: Worsening cellulitis.  Evaluate for abscess.  CT OF THE RIGHT TIBIA FIBULA WITHOUT CONTRAST  Comparison: 05/12/2012.  Findings: Diffuse subcutaneous edema is present in the leg extending through the ankle and hind foot.  No focal fluid collections are identified suspicious for abscess.  The anterior and posterior muscular compartments are within normal limits. Atherosclerosis of the below-the-knee small vessels.  Phleboliths are present in the soft tissues.  There is no fracture.  Moderate midfoot osteoarthritis is present.  Large subchondral geode is present in the posterior calcaneus adjacent to the subtalar joint. This appears degenerative.  Achilles tendon appears intact. Posteromedial, posterolateral and anterior tendon groups appear within normal limits.   Osteoarthritis of the knee is incidentally noted.  Significantly, there is no gas in the soft tissues to suggest necrotizing fasciitis for infection with a gas-forming organism. No osteomyelitis.  IMPRESSION: Diffuse infiltration of the subcutaneous tissues compatible with cellulitis.  No abscess or gas in the soft tissues.  Osteoarthritis of the knee and foot.  Original Report Authenticated By: Andreas Newport, M.D.   Dg Chest Port 1 View  05/15/2012  *RADIOLOGY REPORT*  Clinical Data: Right IJ central line placement  PORTABLE CHEST - 1 VIEW  Comparison: None.  Findings: Normal cardiac silhouette and mediastinal contours. Minimal atherosclerotic calcification within the aortic arch. Interval placement of a right jugular approach central venous catheter with tip overlying the distal SVC.  No pneumothorax. No focal airspace opacities.  No definite pneumothorax or pleural effusion.  Unchanged bones.  IMPRESSION: 1.  Right internal jugular approach central venous catheter tip overlies the distal SVC.  No pneumothorax. 2. No acute cardiopulmonary disease.  Original Report Authenticated By: Waynard Reeds, M.D.    Physical Exam:  Blood pressure 114/57, pulse 128, temperature 98.2 F (36.8 C), temperature source Oral, resp. rate 22, height 5\' 5"  (1.651 m), weight 88.6 kg (195 lb 5.2 oz), SpO2 100.00%.  Gen: alert, has head under towel, HOH, no distress Skin: diffuse bright red rash, confluent on much of LE's up to inguinal area, more patchy on torso Neck: no JVD, no bruits or LAN Chest: clear bilat, no rales or wheezing Heart: tachy, regular, no rub or gallop Abdomen: soft, obese, nondistended, + BS Ext: mild edema bilat LE's Neuro: alert, Ox3, no focal deficit Heme/Lymph: no bruising or LAN  No old creat- SCr 1.04 on admit UA- 100 prot, large LE/Hb, 20-50wbc, tntc rbc's, turbid, +ketones, amorph urates Urine sediment- sheets of nondysmorphic RBC's, occ WBC, rare gran cast, few WBC's, no suspicious  rbc or wbc casts UNa- 82, UCr- 91, FeNa- 1.03% Vanc- trough 15.4 (6/16) ECHO 6/14-   Mod/severe LVH, EF 65%,  gr 1 diast dysfxn CXR- no active disease   Impression 1. Acute kidney injury - suspect hemodynamic due to relative hypotension which occurred about 36-48 hrs ago after treatment for afib/RVR.  Also had ACEI on board, which has been stopped.  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 Urine sediment unimpressive for any renal pathology.  Recommend continue IVF's (low CVP).  REnal function appears to be improving for now.  No need for acute HD at this time.  Avoid nephrotoxins, ACEI, NSAID's, avoid hypotension (on pressors now).   2. RLE cellulitis - abx switched due to AKI and new drug rash 3. Drug rash - cardiology says that diltiazem has sulfa cross-reactivity due to sulfur group, and dilitiazem has been associated with severe allergic skin reactions.  Diltiazem was stopped earlier today.   4. Afib with RVR - on IV amio now, and IV dig 5. DM 2  6. LVH w diast dysfunction by ECHO 7. 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   Vinson Moselle  MD [facility] [phone] pgr    [phone] cell 05/15/2012, 7:58 PM

## 2012-05-15 NOTE — Progress Notes (Signed)
ANTICOAGULATION CONSULT NOTE - Follow Up Consult  Pharmacy Consult for heparin Indication: atrial fibrillation  Labs:  Basename 05/15/12 2306 05/15/12 2035 05/15/12 1830 05/15/12 1500 05/15/12 1125 05/15/12 0620 05/14/12 0632 05/13/12 0555  HGB -- -- -- -- -- 12.0 11.9* --  HCT -- -- -- -- -- 35.8* 37.0 31.4*  PLT -- -- -- -- -- 214 181 167  APTT -- -- -- -- -- -- -- --  LABPROT -- -- -- -- -- -- -- 15.3*  INR -- -- -- -- -- -- -- 1.18  HEPARINUNFRC 0.18* -- -- -- -- -- -- --  CREATININE -- 1.32* -- 1.48* -- 1.89* -- --  CKTOTAL -- -- 44 38 41 -- -- --  CKMB -- -- 1.4 1.4 0.9 -- -- --  TROPONINI -- -- <0.30 <0.30 <0.30 -- -- --     Assessment: 78yo female subtherapeutic on heparin with initial dosing for Afib.  Goal of Therapy:  Heparin level 0.3-0.7 units/ml   Plan:  Will increase heparin gtt by 3 units/kg/hr to 1300 units/hr and check level in 8hr.  Colleen Can PharmD BCPS 05/15/2012,11:49 PM

## 2012-05-15 NOTE — Progress Notes (Signed)
eLink Physician-Brief Progress Note Patient Name: Pamela [name] DOB: 01/14/1934 MRN: [id number]  Date of Service  05/15/2012   HPI/Events of Note   Diffuse erythematous rash with raised areas itching.  Nurse if requesting medication to help with the itching.  This rash occurred today and is felt to be potentially drug related  eICU Interventions  Plan: Low dose IV benadryl 12.5 mg q8 hours prn Sarna lotion to affected areas as needed. Consider topical steroids.   Intervention Category Minor Interventions: Routine modifications to care plan (e.g. PRN medications for pain, fever)  Simpson Paulos 05/15/2012, 11:54 PM

## 2012-05-15 NOTE — Consult Note (Signed)
Reason for Consult: P-A flutter with RVR  Referring Physician: Dr. Sherwood Gambler Pamela [name] is an 78 y.o. female.    Chief Complaint: admitted 05/13/12 for increased redness and swelling of her rt. Lower ext., now A. Flutter with RVR   HPI:  Pamela [name] is an 78 y.o. female with history of diabetes, hypertension, with right foot ulcer managed by the podiatrist, presented to the emergency room with increased redness and swelling. She has no fever or chills, headache, nausea, vomiting or any other symptomology. Evaluation in emergency room included a normal white count of 5.5 thousand, hemoglobin of 11 g , blood sugar of 249 with normal renal function tests. Plain film of the distal tibia fibula revealed no osseous abnormality. In the emergency room, she developed atrial fibrillation with rapid ventricular rate of 150. She stated she has intermittently felt this way before. She never had any stroke or TIA symptomology. No history of congestive heart failure. She has no history of bleeding ulcer, easy bruisability, or frequent falls.  Was placed on IV Cardizem and converted to SR.  Today she went back into A. Flutter with RVR.   2D Echo 05/13/12: Study Conclusions  - Left ventricle: The cavity size was normal. Wall thickness was increased in a pattern of moderate to severe LVH. The estimated ejection fraction was 65%. Wall motion was normal; there were no regional wall motion abnormalities. Doppler parameters are consistent with abnormal left ventricular relaxation (grade 1 diastolic dysfunction). - Right ventricle: The cavity size was normal. Systolic function was normal. - Impressions: Patient developed SVT during the study. Impressions:  - Patient developed SVT during the study   Past Medical History  Diagnosis Date  . Diabetes mellitus   . Hypertension   . Dysrhythmia     Past Surgical History  Procedure Date  . Toe amputation   . Tonsillectomy   . Abdominal hysterectomy       History reviewed. No pertinent family history. Social History:  reports that she has never smoked. She does not have any smokeless tobacco history on file. She reports that she does not drink alcohol. Her drug history not on file.  Allergies:  Allergies  Allergen Reactions  . Sulfa Antibiotics     unknown    Medications Prior to Admission  Medication Sig Dispense Refill  . amitriptyline (ELAVIL) 75 MG tablet Take 75 mg by mouth at bedtime.      Marland Kitchen atorvastatin (LIPITOR) 40 MG tablet Take 40 mg by mouth daily.      Marland Kitchen esomeprazole (NEXIUM) 40 MG capsule Take 40 mg by mouth daily before breakfast.      . lisinopril (PRINIVIL,ZESTRIL) 20 MG tablet Take 20 mg by mouth daily.      . metFORMIN (GLUCOPHAGE) 1000 MG tablet Take 1,000 mg by mouth 2 (two) times daily.      . metoprolol (LOPRESSOR) 50 MG tablet Take 50 mg by mouth daily.      . nabumetone (RELAFEN) 500 MG tablet Take 500 mg by mouth 2 (two) times daily as needed. Pain      . Tamsulosin HCl (FLOMAX) 0.4 MG CAPS Take 0.4 mg by mouth daily.        Results for orders placed during the hospital encounter of 05/12/12 (from the past 48 hour(s))  GLUCOSE, CAPILLARY     Status: Abnormal   Collection Time   05/13/12 11:33 AM      Component Value Range Comment   Glucose-Capillary 283 (*)  70 - 99 mg/dL    Comment 1 Notify RN     VITAMIN B12     Status: Abnormal   Collection Time   05/13/12  1:30 PM      Component Value Range Comment   Vitamin B-12 1050 (*) 211 - 911 pg/mL   FOLATE     Status: Normal   Collection Time   05/13/12  1:30 PM      Component Value Range Comment   Folate 19.9     IRON AND TIBC     Status: Abnormal   Collection Time   05/13/12  1:30 PM      Component Value Range Comment   Iron 59  42 - 135 ug/dL    TIBC 409  811 - 914 ug/dL    Saturation Ratios 18 (*) 20 - 55 %    UIBC 260  125 - 400 ug/dL   FERRITIN     Status: Normal   Collection Time   05/13/12  1:30 PM      Component Value Range Comment    Ferritin 12  10 - 291 ng/mL   RETICULOCYTES     Status: Normal   Collection Time   05/13/12  1:30 PM      Component Value Range Comment   Retic Ct Pct 1.8  0.4 - 3.1 %    RBC. 3.95  3.87 - 5.11 MIL/uL    Retic Count, Manual 71.1  19.0 - 186.0 K/uL   GLUCOSE, CAPILLARY     Status: Abnormal   Collection Time   05/13/12  4:40 PM      Component Value Range Comment   Glucose-Capillary 118 (*) 70 - 99 mg/dL   GLUCOSE, CAPILLARY     Status: Abnormal   Collection Time   05/13/12  9:17 PM      Component Value Range Comment   Glucose-Capillary 162 (*) 70 - 99 mg/dL    Comment 1 Documented in Chart      Comment 2 Notify RN     BASIC METABOLIC PANEL     Status: Abnormal   Collection Time   05/14/12  6:32 AM      Component Value Range Comment   Sodium 134 (*) 135 - 145 mEq/L    Potassium 5.2 (*) 3.5 - 5.1 mEq/L    Chloride 104  96 - 112 mEq/L    CO2 19  19 - 32 mEq/L    Glucose, Bld 256 (*) 70 - 99 mg/dL    BUN 22  6 - 23 mg/dL    Creatinine, Ser 7.82 (*) 0.50 - 1.10 mg/dL DELTA CHECK NOTED   Calcium 8.1 (*) 8.4 - 10.5 mg/dL    GFR calc non Af Amer 33 (*) >90 mL/min    GFR calc Af Amer 38 (*) >90 mL/min   CBC     Status: Abnormal   Collection Time   05/14/12  6:32 AM      Component Value Range Comment   WBC 8.4  4.0 - 10.5 K/uL    RBC 4.01  3.87 - 5.11 MIL/uL    Hemoglobin 11.9 (*) 12.0 - 15.0 g/dL    HCT 95.6  21.3 - 08.6 %    MCV 92.3  78.0 - 100.0 fL    MCH 29.7  26.0 - 34.0 pg    MCHC 32.2  30.0 - 36.0 g/dL    RDW 57.8  46.9 - 62.9 %    Platelets 181  150 -  400 K/uL   GLUCOSE, CAPILLARY     Status: Abnormal   Collection Time   05/14/12  6:39 AM      Component Value Range Comment   Glucose-Capillary 236 (*) 70 - 99 mg/dL   GLUCOSE, CAPILLARY     Status: Abnormal   Collection Time   05/14/12 11:09 AM      Component Value Range Comment   Glucose-Capillary 218 (*) 70 - 99 mg/dL   GLUCOSE, CAPILLARY     Status: Abnormal   Collection Time   05/14/12  4:16 PM      Component Value  Range Comment   Glucose-Capillary 135 (*) 70 - 99 mg/dL   GLUCOSE, CAPILLARY     Status: Abnormal   Collection Time   05/14/12  9:50 PM      Component Value Range Comment   Glucose-Capillary 173 (*) 70 - 99 mg/dL   BASIC METABOLIC PANEL     Status: Abnormal   Collection Time   05/15/12  6:20 AM      Component Value Range Comment   Sodium 132 (*) 135 - 145 mEq/L    Potassium 5.3 (*) 3.5 - 5.1 mEq/L    Chloride 101  96 - 112 mEq/L    CO2 16 (*) 19 - 32 mEq/L    Glucose, Bld 245 (*) 70 - 99 mg/dL    BUN 33 (*) 6 - 23 mg/dL    Creatinine, Ser 1.61 (*) 0.50 - 1.10 mg/dL    Calcium 7.9 (*) 8.4 - 10.5 mg/dL    GFR calc non Af Amer 25 (*) >90 mL/min    GFR calc Af Amer 29 (*) >90 mL/min   CBC     Status: Abnormal   Collection Time   05/15/12  6:20 AM      Component Value Range Comment   WBC 11.6 (*) 4.0 - 10.5 K/uL    RBC 3.93  3.87 - 5.11 MIL/uL    Hemoglobin 12.0  12.0 - 15.0 g/dL    HCT 09.6 (*) 04.5 - 46.0 %    MCV 91.1  78.0 - 100.0 fL    MCH 30.5  26.0 - 34.0 pg    MCHC 33.5  30.0 - 36.0 g/dL    RDW 40.9  81.1 - 91.4 %    Platelets 214  150 - 400 K/uL   GLUCOSE, CAPILLARY     Status: Abnormal   Collection Time   05/15/12  6:32 AM      Component Value Range Comment   Glucose-Capillary 242 (*) 70 - 99 mg/dL   URINALYSIS, ROUTINE W REFLEX MICROSCOPIC     Status: Abnormal   Collection Time   05/15/12  9:52 AM      Component Value Range Comment   Color, Urine AMBER (*) YELLOW BIOCHEMICALS MAY BE AFFECTED BY COLOR   APPearance TURBID (*) CLEAR    Specific Gravity, Urine 1.021  1.005 - 1.030    pH 5.5  5.0 - 8.0    Glucose, UA NEGATIVE  NEGATIVE mg/dL    Hgb urine dipstick LARGE (*) NEGATIVE    Bilirubin Urine SMALL (*) NEGATIVE    Ketones, ur 15 (*) NEGATIVE mg/dL    Protein, ur 782 (*) NEGATIVE mg/dL    Urobilinogen, UA 0.2  0.0 - 1.0 mg/dL    Nitrite NEGATIVE  NEGATIVE    Leukocytes, UA LARGE (*) NEGATIVE   URINE MICROSCOPIC-ADD ON     Status: Abnormal   Collection Time  05/15/12  9:52 AM      Component Value Range Comment   Squamous Epithelial / LPF FEW (*) RARE    WBC, UA 21-50  <3 WBC/hpf    RBC / HPF TOO NUMEROUS TO COUNT  <3 RBC/hpf    Urine-Other AMORPHOUS URATES/PHOSPHATES      No results found.  ROS: Difficult to get much history as pt somnolent due to illness: CV:no chest pain, she denied she has had cardiac work up, she is not aware of tachycardia PUL:no SOB    Blood pressure 135/82, pulse 144, temperature 100.1 F (37.8 C), temperature source Oral, resp. rate 19, height 5\' 5"  (1.651 m), weight 84.5 kg (186 lb 4.6 oz), SpO2 96.00%. PE: General:somnolent, but arouses sometimes with sternal rub.  Answers questions approp. When not falling asleep Skin:Hot to touch, trunk pink lt. Leg darker pink, rt. Leg bright red.  Face mottled with white and pink HEENT:sclera clear Neck:supple, no JVD Heart:S1S2 irreg irreg with RVR Lungs:clear ant. Abd:+ BS, soft non tender ZOX:WRUEA as above, rt. Leg swollen with cellulitis Neuro:oriented but somnolent  EKG with A flutter with BBB    Assessment/Plan Patient Active Problem List  Diagnosis  . Cellulitis in diabetic foot  . DM (diabetes mellitus)  . HTN (hypertension)  . Hypercholesterolemia  . Afib  . Anemia  . Diastolic dysfunction, left ventricle   PLAN:Temp rectally 103.5, IV fluids bolus, Dr. Royann Shivers here.Wil transfer to ICU, now.    INGOLD,LAURA R 05/15/2012, 10:51 AM  See additional note. Thurmon Fair, MD, [facility] [phone] office [phone] pager

## 2012-05-15 NOTE — Progress Notes (Signed)
ANTICOAGULATION CONSULT NOTE - Initial Consult Pharmacy Consult for heparin Indication: atrial fibrillation  ANTIBIOTIC CONSULT NOTE - Follow up Pharmacy Consult for Ciprofloxacin Indication: Cellulitis and possible sepsis   Allergies  Allergen Reactions  . Sulfa Antibiotics     unknown    Patient Measurements: Height: 5\' 5"  (165.1 cm) Weight: 186 lb 4.6 oz (84.5 kg) IBW/kg (Calculated) : 57  Heparin Dosing Weight: 75.225 kg  Vital Signs: Temp: 103.5 F (39.7 C) (06/16 1113) Temp src: Rectal (06/16 1113) BP: 152/70 mmHg (06/16 1106) Pulse Rate: 164  (06/16 1106)  Labs:  Basename 05/15/12 1125 05/15/12 1015 05/15/12 0620 05/14/12 0632 05/13/12 0555  HGB -- -- 12.0 11.9* --  HCT -- -- 35.8* 37.0 31.4*  PLT -- -- 214 181 167  APTT -- -- -- -- --  LABPROT -- -- -- -- 15.3*  INR -- -- -- -- 1.18  HEPARINUNFRC -- -- -- -- --  CREATININE -- -- 1.89* 1.48* 0.76  CKTOTAL 41 45 -- -- --  CKMB 0.9 -- -- -- --  TROPONINI <0.30 -- -- -- --    Estimated Creatinine Clearance: 27.2 ml/min (by C-G formula based on Cr of 1.89).   Medical History: Past Medical History  Diagnosis Date  . Diabetes mellitus   . Hypertension   . Dysrhythmia    Labs:  [facility] 05/15/12 0620 05/14/12 0632 05/13/12 0555  WBC 11.6* 8.4 5.7  HGB 12.0 11.9* 10.2*  PLT 214 181 167  LABCREA -- -- --  CREATININE 1.89* 1.48* 0.76   Estimated Creatinine Clearance: 27.2 ml/min (by C-G formula based on Cr of 1.89).  Basename 05/15/12 1015  VANCOTROUGH 15.4  VANCOPEAK --  VANCORANDOM --  GENTTROUGH --  GENTPEAK --  GENTRANDOM --  TOBRATROUGH --  TOBRAPEAK --  TOBRARND --  AMIKACINPEAK --  AMIKACINTROU --  AMIKACIN --     Microbiology: Recent Results (from the past 720 hour(s))  CULTURE, BLOOD (ROUTINE X 2)     Status: Normal (Preliminary result)   Collection Time   05/12/12  9:26 PM      Component Value Range Status Comment   Specimen Description BLOOD LEFT ARM   Final    Special  Requests BOTTLES DRAWN AEROBIC AND ANAEROBIC   Final    Culture  Setup Time [id number]   Final    Culture     Final    Value:        BLOOD CULTURE RECEIVED NO GROWTH TO DATE CULTURE WILL BE HELD FOR 5 DAYS BEFORE ISSUING A FINAL NEGATIVE REPORT   Report Status PENDING   Incomplete   CULTURE, BLOOD (ROUTINE X 2)     Status: Normal (Preliminary result)   Collection Time   05/12/12  9:32 PM      Component Value Range Status Comment   Specimen Description BLOOD LEFT HAND   Final    Special Requests BOTTLES DRAWN AEROBIC ONLY   Final    Culture  Setup Time [id number]   Final    Culture     Final    Value:        BLOOD CULTURE RECEIVED NO GROWTH TO DATE CULTURE WILL BE HELD FOR 5 DAYS BEFORE ISSUING A FINAL NEGATIVE REPORT   Report Status PENDING   Incomplete    Assessment: Pamela [name] admitted 05/12/2012 with ulcer and cellulitis of R foot DM ulcer and cellulitis. She has new atrial fibrillation, and was started on Pradaxa. She spiked a fever to 103.5 and  appears septic, with AMS, worsening renal function, and developing metabolic acidosis. Concern for allergic reaction with ongoing vanc/Zosyn + ARF >> linezolid, clindamycin, cipro. Concern for deteriorating renal function on Pradaxa >> change to heparin drip.   Pharmacy consulted to start heparin and ciprofloxacin. Noted patient is concomitantly on clindamycin and linezolid.   Antibiotics:  6/13 Zosyn >> 6/16 6/13 Vancomycin >> 6/16 6/16 Linezolid, Cipro, Clindamycin >> (on Cipro PTA)   Cx: 6/13: Blood: ngtd   Goal of Therapy:  Heparin level 0.3-0.7 units/ml Monitor platelets by anticoagulation protocol: Yes Renally adjust antibiotics    Plan: - Heparin 1050 units/hr (=10.5 mL/hr), no bolus, pt has been taking Pradaxa. Platelet wnl today. - Heparin level 8 hours after hanging. - Daily heparin level and CBC, monitor for bleeding. - Ciprofloxacin 400mg  IV q24h, will adjust as needed for changes in renal function. - Will  discontinue CBC and CK labs, ordered to monitor Daptomycin toxicity (discontinued). - Will follow cx/sens and abx duration of therapy. - Will follow renal function and check abx doses as necessary.  Frutoso Chase, PharmD pgr [phone] 05/15/2012, 1:24 PM  Thank you for allowing pharmacy to be part of this patients care team.

## 2012-05-15 NOTE — Progress Notes (Signed)
Took a message from [facility] RN from Dr. Bary Richard regarding stat nature of Sepsis protocol for Mrs Imel.    Review of orders and MAR as follows: orders for cipro and clindamycin were entered at 1209 and 1300 respectively.  Linezolid was hung at 1200, Clindamycin at 1304 and Ciprofloxacin at 1525.    Pharmacy will Follow up SCr, UOP, cultures, clinical course and adjust as clinically indicated.  Thank you for allowing pharmacy to be a part of this patients care team.  Lovenia Kim Pharm.D., BCPS Clinical Pharmacist 05/15/2012 3:34 PM Pager: [phone] Phone: [phone]

## 2012-05-15 NOTE — Consult Note (Signed)
Please also refer to full consult note from Nada Boozer, NP.  Briefly, this 78 year old woman with a diabetic ulcer and cellulitis has developed atrial fibrillation with rapid ventricular response and rate-related LBBB in the setting of what appears to be developing sepsis. She now has a fever to 103.5 degrees F., is developing metabolic acidosis and severe tachycardia. Worsening mental status and rapidly worsening renal function despite IV fluids. Denies angina at this time or in the past. No prior exertional dyspnea, but she has had palpitations.  No history of structural heart disease in the past and current echo shows normal left ventricular systolic function. Numerous coronary risk factors and obvious evidence of PAD by exam.  Recommend IV amiodarone to try to control rhythm without dropping BP.  She is critically ill and should move to ICU.  Pradaxa may need to be stopped with worsening renal function. Prefer IV heparin.  Thurmon Fair, MD, [facility] and [facility] [phone] office [phone] pager 05/15/2012 11:33 AM

## 2012-05-15 NOTE — Progress Notes (Signed)
ANTIBIOTIC CONSULT NOTE - INITIAL  Pharmacy Consult for Daptomycin Indication: Cellulitis  Allergies  Allergen Reactions  . Sulfa Antibiotics     unknown    Patient Measurements: Height: 5\' 5"  (165.1 cm) Weight: 186 lb 4.6 oz (84.5 kg) IBW/kg (Calculated) : 57   Vital Signs: Temp: 100.1 F (37.8 C) (06/15 2103) Temp src: Oral (06/15 2103) BP: 129/65 mmHg (06/15 2103) Pulse Rate: 67  (06/15 2103) Intake/Output from previous day: 06/15 0701 - 06/16 0700 In: 480 [P.O.:480] Out: -  Intake/Output from this shift:    Labs:  Basename 05/15/12 0620 05/14/12 0632 05/13/12 0555  WBC 11.6* 8.4 5.7  HGB 12.0 11.9* 10.2*  PLT 214 181 167  LABCREA -- -- --  CREATININE 1.89* 1.48* 0.76   Estimated Creatinine Clearance: 27.2 ml/min (by C-G formula based on Cr of 1.89).  Assessment: 78 y.o. female admitted 05/12/2012 with cellulitis of right foot. Pt continues to have low grd fever, WBC elevated. Noted concern for allergic reaction with ongoing vanc/Zosyn + ARF, prompting switch to Daptomycin.  Antibiotics:  6/13 Zosyn >> 6/16 6/13 Vancomycin >> 6/16 6/16 Daptomycin>> (on Cipro PTA)   Cx: 6/13: Blood: ngtd Noted medications d/c or dose adjusted for incr SCr.  Plan:  - Begin Daptomycin 4mg /Kg q 48H= 340mg  Q 48H (adjusted for GFR<30) - Will monitor CBC d/t risk of anemia with Dapto - Will check CK now, and weekly on Wednesday d/t risk of myopathy with Dapto- especially with concurrent statin therapy - Will f/up cx/sens and abx duration of therapy - Will f/up renal function.  Allena Katz, Nathanael Krist K 05/15/2012,8:47 AM

## 2012-05-15 NOTE — Progress Notes (Signed)
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  Pharmacy consulted to screen medications for sulfa drugs.  Current Meds:  Has sulfa moiety? 1) Lipitor   No 2) Cipro   No 3) Clindamycin  No 4) Linezolid   No 5) Lantus   No 6) Amiodarone  No 7) Diltiazem   No   Diltiazem's chemical structure does contain a sulfa atom; however, there is not a sulfa moiety and so it is not convincing to consider diltiazem a sulfa drug.  Of note, acute generalized exanthematous pustulosis and severe dermatitis are possible reactions to diltiazem.     Pamela [name] D. Laney Potash, PharmD, BCPS Pager:  [phone] 05/15/2012, 10:07 PM

## 2012-05-16 ENCOUNTER — Inpatient Hospital Stay (HOSPITAL_COMMUNITY): Payer: Medicare Other

## 2012-05-16 DIAGNOSIS — L02619 Cutaneous abscess of unspecified foot: Secondary | ICD-10-CM

## 2012-05-16 DIAGNOSIS — L03119 Cellulitis of unspecified part of limb: Secondary | ICD-10-CM

## 2012-05-16 LAB — BASIC METABOLIC PANEL
BUN: 20 mg/dL (ref 6–23)
BUN: 24 mg/dL — ABNORMAL HIGH (ref 6–23)
CO2: 20 mEq/L (ref 19–32)
CO2: 21 mEq/L (ref 19–32)
Calcium: 6.8 mg/dL — ABNORMAL LOW (ref 8.4–10.5)
Calcium: 5.6 mg/dL — CL (ref 8.4–10.5)
Calcium: 7 mg/dL — ABNORMAL LOW (ref 8.4–10.5)
Creatinine, Ser: 1.26 mg/dL — ABNORMAL HIGH (ref 0.50–1.10)
Creatinine, Ser: 0.93 mg/dL (ref 0.50–1.10)
Creatinine, Ser: 1.16 mg/dL — ABNORMAL HIGH (ref 0.50–1.10)
GFR calc non Af Amer: 40 mL/min — ABNORMAL LOW (ref >90)
GFR calc non Af Amer: 58 mL/min — ABNORMAL LOW (ref >90)
GFR calc non Af Amer: 41 mL/min — ABNORMAL LOW (ref >90)
GFR calc non Af Amer: 45 mL/min — ABNORMAL LOW (ref >90)
Glucose, Bld: 196 mg/dL — ABNORMAL HIGH (ref 70–99)
Glucose, Bld: 178 mg/dL — ABNORMAL HIGH (ref 70–99)
Glucose, Bld: 235 mg/dL — ABNORMAL HIGH (ref 70–99)
Glucose, Bld: 181 mg/dL — ABNORMAL HIGH (ref 70–99)
Potassium: 4 mEq/L (ref 3.5–5.1)
Sodium: 128 mEq/L — ABNORMAL LOW (ref 135–145)

## 2012-05-16 LAB — CBC
Hemoglobin: 12.6 g/dL (ref 12.0–15.0)
MCH: 30.2 pg (ref 26.0–34.0)
MCV: 89.7 fL (ref 78.0–100.0)
Platelets: 190 10*3/uL (ref 150–400)
RBC: 4.17 MIL/uL (ref 3.87–5.11)
WBC: 15.2 10*3/uL — ABNORMAL HIGH (ref 4.0–10.5)

## 2012-05-16 LAB — HEPATIC FUNCTION PANEL
ALT: 14 U/L (ref 0–35)
AST: 17 U/L (ref 0–37)
Albumin: 1.4 g/dL — ABNORMAL LOW (ref 3.5–5.2)
Alkaline Phosphatase: 59 U/L (ref 39–117)
Total Protein: 4.2 g/dL — ABNORMAL LOW (ref 6.0–8.3)

## 2012-05-16 LAB — DIFFERENTIAL
Eosinophils Relative: 5 % (ref 0–5)
Lymphocytes Relative: 7 % — ABNORMAL LOW (ref 12–46)
Lymphs Abs: 0.7 10*3/uL (ref 0.7–4.0)
Neutro Abs: 8.7 10*3/uL — ABNORMAL HIGH (ref 1.7–7.7)

## 2012-05-16 LAB — PROCALCITONIN: Procalcitonin: 0.28 ng/mL

## 2012-05-16 LAB — GLUCOSE, CAPILLARY: Glucose-Capillary: 293 mg/dL — ABNORMAL HIGH (ref 70–99)

## 2012-05-16 LAB — CARDIAC PANEL(CRET KIN+CKTOT+MB+TROPI): Troponin I: <0.3 ng/mL (ref <0.30)

## 2012-05-16 MED ORDER — CALCIUM CARBONATE 1250 (500 CA) MG PO TABS
1000.0000 mg | ORAL_TABLET | Freq: Three times a day (TID) | ORAL | Status: DC
  Administered 2012-05-16 – 2012-05-23 (×22): 1000 mg via ORAL
  Filled 2012-05-16 (×26): qty 2

## 2012-05-16 MED ORDER — AMIODARONE HCL IN DEXTROSE 360-4.14 MG/200ML-% IV SOLN
0.5000 mg/min | INTRAVENOUS | Status: DC
  Administered 2012-05-16 – 2012-05-17 (×3): 0.5 mg/min via INTRAVENOUS
  Filled 2012-05-16 (×7): qty 200

## 2012-05-16 MED ORDER — DEXTROSE 5 % IV SOLN
1.0000 g | Freq: Three times a day (TID) | INTRAVENOUS | Status: DC
  Administered 2012-05-16 – 2012-05-20 (×12): 1 g via INTRAVENOUS
  Filled 2012-05-16 (×15): qty 1

## 2012-05-16 MED ORDER — PHENYLEPHRINE HCL 10 MG/ML IJ SOLN
30.0000 ug/min | INTRAVENOUS | Status: DC | PRN
  Administered 2012-05-16: 20 ug/min via INTRAVENOUS
  Administered 2012-05-16: 30 ug/min via INTRAVENOUS
  Administered 2012-05-16: 40 ug/min via INTRAVENOUS
  Filled 2012-05-16 (×2): qty 4

## 2012-05-16 MED ORDER — CIPROFLOXACIN IN D5W 400 MG/200ML IV SOLN
400.0000 mg | Freq: Two times a day (BID) | INTRAVENOUS | Status: DC
Start: 2012-05-16 — End: 2012-05-16
  Filled 2012-05-16: qty 200

## 2012-05-16 MED ORDER — SODIUM CHLORIDE 0.9 % IV SOLN
INTRAVENOUS | Status: DC
  Administered 2012-05-22: 19:00:00 via INTRAVENOUS

## 2012-05-16 MED ORDER — SODIUM CHLORIDE 0.9 % IV BOLUS (SEPSIS)
1000.0000 mL | Freq: Once | INTRAVENOUS | Status: AC
  Administered 2012-05-16: 1000 mL via INTRAVENOUS

## 2012-05-16 MED ORDER — POTASSIUM CHLORIDE CRYS ER 20 MEQ PO TBCR
40.0000 meq | EXTENDED_RELEASE_TABLET | Freq: Once | ORAL | Status: AC
  Administered 2012-05-16: 40 meq via ORAL
  Filled 2012-05-16: qty 2

## 2012-05-16 MED ORDER — AMIODARONE HCL IN DEXTROSE 360-4.14 MG/200ML-% IV SOLN
1.0000 mg/min | INTRAVENOUS | Status: AC
  Administered 2012-05-16 (×2): 1 mg/min via INTRAVENOUS
  Filled 2012-05-16: qty 200

## 2012-05-16 MED ORDER — AMIODARONE IV BOLUS ONLY 150 MG/100ML
150.0000 mg | Freq: Once | INTRAVENOUS | Status: AC
  Administered 2012-05-16: 150 mg via INTRAVENOUS

## 2012-05-16 NOTE — Progress Notes (Signed)
CRITICAL VALUE ALERT  Critical value received:  Calcium level 5.6  Date of notification:  05/16/2012  Time of notification:  0925  Critical value read back:yes  Nurse who received alert:  Deneise Lever, RN  MD notified (1st page):  Dr. Kathrene Bongo  Time of first page:  0932  MD notified (2nd page):  Time of second page:  Responding MD:  Dr. Kathrene Bongo  Time MD responded:  [phone]

## 2012-05-16 NOTE — Consult Note (Signed)
S: 78yo female with h/o RLE cellulitis. Admitted for IV abx..has episodes of hypotension, renal failure.  Called to consult on progressive rash on body. H/o SJS to sulfa several years ago. R/o SJS/TEN.  O: PE reveals RLE with diffuse erythema, warmth, and 2-3+ pitting edema.  LLE with distal mild erythema and mild edema. RUE with erythema and purpura but not as warm or diffuse as compared to LUE. LUE with erythema and warmth, primarily on forearm. Purpuric patches on L wrist. Left upper arm with linear purpuric patches most likely secondary to external BP cuff/trauma.  Mucous membranes clear- no evidence of oral or ocular lesions/breakdown.   Palms/Soles- Essentially clear. No evidence of targetoid lesions or vesicles. Back- diffuse erythema and warmth  No bullous or vesicular lesions. No pustules.  No palpable purpura.   A/P: PT w h/o RLE cellulitis.  No evidence of SJS/TEN at this time. Doubt overall drug reaction.  Feel cutaneous changes are most consistent with overall sepsis picture and subsequent rehydration, steroid therapy, and anticoagulation. Edema and purpuric patches consistent with above treatment.   Consider vasculitis if purpuric areas continue to progress into palpable purpura or embolic phenomenon secondary to infection and other cardiac abnormalities.  Would continue with antibiotic therapy as needed.   Recommend Triamcinolone 0.1% cream BID to RLE and L forearm to aid with overall irritation.  Continue to regulate kidney and cardiac function in hopes of overall improvement.  Call if additional concerns.   Alyse Kathan Y [place], MD

## 2012-05-16 NOTE — Progress Notes (Signed)
Name: Pamela [name] MRN: [id number] DOB: 09-Feb-1934    LOS: 4  Referring Provider:  Lavera Guise Reason for Referral:  Acidosis, mental status change  PULMONARY / CRITICAL CARE MEDICINE  Brief patient description:   78 y.o admitted 6/13 w/ RLE cellulitis, sepsis, and SVT. Initially it appeared as though she was improving clinically, with the exception of slight bump in her creatinine. On 6/16 moved to ICU as found more somnolent, had developed progressive renal failure, diffuse rash and urticaria, as well as metabolic acidosis, hyperkalemia and AF w/ RVR. Her RLE appeared to have become more erythremic and swelling had progressed, also she had developed a diffuse rash. PCCM was asked to eval given concern for sepsis and progressive metabolic acidosis and also concern for possible adverse drug reaction to her antibiotic therapy.   Events Since Admission: Moved to ICU, placed on amiodarone gtt  Current Status: critical Vital Signs: Temp:  [98.2 F (36.8 C)-103.5 F (39.7 C)] 102.8 F (39.3 C) (06/17 0800) Pulse Rate:  [118-164] 136  (06/17 0800) Resp:  [19-29] 23  (06/17 0800) BP: (72-152)/(40-82) 113/56 mmHg (06/17 0800) SpO2:  [93 %-100 %] 97 % (06/17 0800) Weight:  [88.6 kg (195 lb 5.2 oz)] 88.6 kg (195 lb 5.2 oz) (06/16 1600)  Physical Examination: General:  Acutely ill appearing female. Lethargic and slow to respond Neuro:  Drowsy, lethargic. Oriented  HEENT:  MM are dry, No JVD Cardiovascular:  Tachy irreg irreg  Lungs:  Clear, decreased Abdomen:  Soft, non-tender, No OM, hypoactive Musculoskeletal:  RLE is swollen/edematous, erythremic, warm to touch. It appears that the erythema has worsened.  Skin:  RLE is swollen, warm to touch, and erythremic. She has developed a diffuse rash over her entire body  Principal Problem:  *Cellulitis in diabetic foot Active Problems:  DM (diabetes mellitus)  HTN (hypertension)  Hypercholesterolemia  Afib, paroxysmal, with RVR at times with  BBB  Anemia  Diastolic dysfunction, left ventricle  Acute kidney failure  Allergic drug rash  Septic shock  ASSESSMENT AND PLAN  PULMONARY  Lab 05/15/12 1737 05/15/12 1152  PHART -- 7.325*  PCO2ART -- 32.1*  PO2ART -- 90.0  HCO3 -- 16.3*  O2SAT 85.9 95.0   Ventilator Settings:   CXR:  No infiltrates. CVL in good position ETT:    A:  No acute pulmonary pathology. Currently she is compensating for her metabolic acidosis. She is at risk for decline should her MS worsen. P:   -Pulse ox. -Titrate O2 as tolerated.  -F/U am cxr, as this is the only film to eval this hazzy LLL -ABG reviewed, may need repeat.  CARDIOVASCULAR Lab 05/16/12 0354 05/16/12 0353 05/15/12 1830 05/15/12 1500 05/15/12 1205 05/15/12 1125 05/15/12 1015  TROPONINI -- <0.30 <0.30 <0.30 -- <0.30 --  LATICACIDVEN 2.1 -- -- 1.3 1.7 -- 3.2*  PROBNP -- -- -- -- -- -- --   ECG:  Af w/ RVR, wide complex QRS Lines: Right IJ CVL 6/16>>>  ECHO 6/16: Left ventricle: The cavity size was normal. Wall thickness was increased in a pattern of moderate to severe LVH. The estimated ejection fraction was 65%. Wall motion was normal; there were no regional wall motion abnormalities. Doppler parameters are consistent with abnormal left ventricular relaxation (grade 1 diastolic dysfunction). - Right ventricle: The cavity size was normal. Systolic function was normal.  A: AF w/ RVR, has wide QRS. Cards following. Suspect that this is a response to her acute illness, as she has been in and out of AF.  No doubt also being driven by fever.  P:  -Amio drip per cards, reebolused this AM for tachycardia. -Central access to assess CVP: gaol 8-12 -Continue neo and titrate for MAP of 65 mmHg. -Will stop pradaxa for now given renal injury and continue heparin drip.  A:SIRS/Sepsis. Currently has good SBP, w/ lactate boarder line at 2.1.  P: -KVO IVF. -Repeat lactate improving, will not start sepsis protocol to avoid fluid overload  for now. -Stopped CCB. -Cortisol level 21.9.  No need for stress dose steroids. -CT with cellulitis but no free gas or nec fasc.  RENAL Lab 05/16/12 0154 05/15/12 2035 05/15/12 1500 05/15/12 0620 05/14/12 0632  NA 127* 125* 129* 132* 134*  K 3.4* 4.2 -- -- --  CL 98 95* 99 101 104  CO2 20 18* 17* 16* 19  BUN 25* 27* 29* 33* 22  CREATININE 1.26* 1.32* 1.48* 1.89* 1.48*  CALCIUM 6.8* 7.4* 7.6* 7.9* 8.1*  MG -- -- -- -- --  PHOS -- -- -- -- --   Intake/Output      06/16 0701 - 06/17 0700 06/17 0701 - 06/18 0700   P.O.     I.V. (mL/kg) 4188.3 (47.3) 229.7 (2.6)   IV Piggyback 1450    Total Intake(mL/kg) 5638.3 (63.6) 229.7 (2.6)   Urine (mL/kg/hr) 1880 (0.9) 100   Total Output 1880 100   Net +3758.3 +129.7         Foley:    A: Acute renal Failure: unclear if this is ATN in setting of sepsis and Ace-I, due to possible nephritis s/p strep, or possibly drug induced after abx ?r/o IN,  nephro consulted.  May be intravascular depleted. P: -Send urine studies with 21-50 WBC but RBC TNTC. -Renal US pending -Avoid nephro-toxins. -D/Ced vanc/zosyn and abx as below. -Renal dose meds. -F/U chemistry in AM. -KVO IVF.  A:Metabolic Acidosis Both non-anion gap/and anion gap: prob due to RVR and her AKI.  P: -Continue bicarb drip to avoid high level of Cl resulting in more acidosis. -Close obs of chemistry.  A:hypokalemia P: -Gentle replacement. -F/U BMET.  GASTROINTESTINAL No results found for this basename: AST:5,ALT:5,ALKPHOS:5,BILITOT:5,PROT:5,ALBUMIN:5 in the last 168 hours  A:  No Acute issues P:   -SUP ordered. -Diabetic diet.  HEMATOLOGIC  Lab 05/16/12 0353 05/15/12 0620 05/14/12 0632 05/13/12 0555 05/12/12 2122  HGB 12.6 12.0 11.9* 10.2* 11.0*  HCT 37.4 35.8* 37.0 31.4* 33.9*  PLT 190 214 181 167 197  INR -- -- -- 1.18 --  APTT -- -- -- -- --   A: Has moderate Anemia at baseline. Hgb of 12 may reflect hemoconcentration, as she has third spaced significantly  and may still be vascularly dry. P:  -Trend CBC -Transfuse for hgb Z< 7, unless she develops shock  INFECTIOUS  Lab 05/16/12 0353 05/15/12 1015 05/15/12 0620 05/14/12 0632 05/13/12 0555 05/12/12 2122  WBC 15.2* -- 11.6* 8.4 5.7 5.5  PROCALCITON 0.28 0.23 -- -- -- --   Cultures: 6/13 BCX2 6/13>>>NTD BCX2 6/16>>>NTD  Antibiotics: vanc 6/13>>>6/16 (rash) Zosyn 6/13>>>6/16 (rash) clinda (cellulitis) 6/16>>> cipro (cellulitis) 6/16>>> zyvox (cellulitis) 6/16>>> A:   LE Cellulitis w/ resulting severe sepsis. Her lactate is 3.2 but currently systolic SBP holding. Need further eval of LE to r/o progression of infection.Elita Boone about strep group, r/o nec fasc P:   -See abx noted above -Cultures sent and all NTD. -PCT 0.28. -CT LE negative for gas formation, only cellulitis. -Clinda for toxin inhibition.  ENDOCRINE  Lab 05/16/12 0827 05/15/12  2347 05/15/12 2019 05/15/12 1725 05/15/12 0632  GLUCAP 157* 221* 302* 293* 242*   A: DM/ Hyperglycemia P:   -ssi  NEUROLOGIC  A:  Acute Encephalopathy: prob due to sepsis, fever and renal failure.  P:   -supportive care  Derm: Diffuse erythemic rash: ? Drugs ? Sepsis.  P: -vanc and zosyn stopped, although I am more impressed that this is sepsis related . Rash may be toxic shock etc -monitor, may need to record allergy, avoid for now  CC time 35 min.  BEST PRACTICE / DISPOSITION Level of Care:  icu Primary Service:  pccm Consultants:  Cards and nephro Code Status:  full Diet:  npo DVT Px:  Heparin gtt GI Px:  ppi Skin Integrity:  Diffuse rash Social / Family:  Updated by med svc  Alyson Reedy, M.D. [facility] Pulmonary/Critical Care Medicine. Pager: [phone]. After hours pager: [phone].

## 2012-05-16 NOTE — Progress Notes (Signed)
Subjective:  Still good UOP.  No acute distress Objective Vital signs in last 24 hours: Filed Vitals:   05/16/12 0400 05/16/12 0500 05/16/12 0600 05/16/12 0700  BP: 111/58 111/60 123/57 116/58  Pulse: 127 131 132 131  Temp: 98.3 F (36.8 C)     TempSrc: Oral     Resp: 23 21 24 22   Height:      Weight:      SpO2: 98% 98% 98% 98%   Weight change:   Intake/Output Summary (Last 24 hours) at 05/16/12 0818 Last data filed at 05/16/12 0700  Gross per 24 hour  Intake 5638.3 ml  Output   1880 ml  Net 3758.3 ml   Labs: Basic Metabolic Panel:  Lab 05/16/12 8119 05/15/12 2035 05/15/12 1500  NA 127* 125* 129*  K 3.4* 4.2 4.5  CL 98 95* 99  CO2 20 18* 17*  GLUCOSE 196* 326* 275*  BUN 25* 27* 29*  CREATININE 1.26* 1.32* 1.48*  CALCIUM 6.8* 7.4* 7.6*  ALB -- -- --  PHOS -- -- --   Liver Function Tests: No results found for this basename: AST:3,ALT:3,ALKPHOS:3,BILITOT:3,PROT:3,ALBUMIN:3 in the last 168 hours No results found for this basename: LIPASE:3,AMYLASE:3 in the last 168 hours No results found for this basename: AMMONIA:3 in the last 168 hours CBC:  Lab 05/16/12 0353 05/15/12 0620 05/14/12 0632 05/13/12 0555 05/12/12 2122  WBC 15.2* 11.6* 8.4 -- --  NEUTROABS -- -- -- -- 2.8  HGB 12.6 12.0 11.9* -- --  HCT 37.4 35.8* 37.0 -- --  MCV 89.7 91.1 92.3 92.4 92.9  PLT 190 214 181 -- --   Cardiac Enzymes:  Lab 05/16/12 0353 05/15/12 1830 05/15/12 1500 05/15/12 1125 05/15/12 1015  CKTOTAL 44 44 38 41 45  CKMB 1.5 1.4 1.4 0.9 --  CKMBINDEX -- -- -- -- --  TROPONINI <0.30 <0.30 <0.30 <0.30 --   CBG:  Lab 05/15/12 2347 05/15/12 2019 05/15/12 1725 05/15/12 0632 05/14/12 2150  GLUCAP 221* 302* 293* 242* 173*    Iron Studies:  Basename 05/13/12 1330  IRON 59  TIBC 319  TRANSFERRIN --  FERRITIN 12   Studies/Results: Ct Tibia Fibula Right Wo Contrast  05/15/2012  *RADIOLOGY REPORT*  Clinical Data: Worsening cellulitis.  Evaluate for abscess.  CT OF THE RIGHT TIBIA  FIBULA WITHOUT CONTRAST  Comparison: 05/12/2012.  Findings: Diffuse subcutaneous edema is present in the leg extending through the ankle and hind foot.  No focal fluid collections are identified suspicious for abscess.  The anterior and posterior muscular compartments are within normal limits. Atherosclerosis of the below-the-knee small vessels.  Phleboliths are present in the soft tissues.  There is no fracture.  Moderate midfoot osteoarthritis is present.  Large subchondral geode is present in the posterior calcaneus adjacent to the subtalar joint. This appears degenerative.  Achilles tendon appears intact. Posteromedial, posterolateral and anterior tendon groups appear within normal limits.  Osteoarthritis of the knee is incidentally noted.  Significantly, there is no gas in the soft tissues to suggest necrotizing fasciitis for infection with a gas-forming organism. No osteomyelitis.  IMPRESSION: Diffuse infiltration of the subcutaneous tissues compatible with cellulitis.  No abscess or gas in the soft tissues.  Osteoarthritis of the knee and foot.  Original Report Authenticated By: Andreas Newport, M.D.   Dg Chest Port 1 View  05/16/2012  *RADIOLOGY REPORT*  Clinical Data: Shortness of breath.  Question pneumonia.  PORTABLE CHEST - 1 VIEW  Comparison: Chest 05/15/2012.  Findings: Right IJ catheter remains in  place.  Lungs appear clear. No pneumothorax or pleural fluid.  Right costophrenic angle is just off the margin of film.  Heart size normal.  IMPRESSION: No acute finding.  Original Report Authenticated By: Bernadene Bell. Maricela Curet, M.D.   Dg Chest Port 1 View  05/15/2012  *RADIOLOGY REPORT*  Clinical Data: Right IJ central line placement  PORTABLE CHEST - 1 VIEW  Comparison: None.  Findings: Normal cardiac silhouette and mediastinal contours. Minimal atherosclerotic calcification within the aortic arch. Interval placement of a right jugular approach central venous catheter with tip overlying the distal SVC.   No pneumothorax. No focal airspace opacities.  No definite pneumothorax or pleural effusion.  Unchanged bones.  IMPRESSION: 1.  Right internal jugular approach central venous catheter tip overlies the distal SVC.  No pneumothorax. 2. No acute cardiopulmonary disease.  Original Report Authenticated By: Waynard Reeds, M.D.   Medications: Infusions:    . sodium chloride 100 mL/hr at 05/15/12 2018  . sodium chloride 10 mL/hr at 05/15/12 1900  . amiodarone (NEXTERONE PREMIX) 360 mg/200 mL dextrose 30 mg/hr (05/15/12 2054)  . heparin 1,300 Units/hr (05/15/12 2354)  .  sodium bicarbonate infusion 1000 mL 75 mL/hr at 05/16/12 0124  . DISCONTD: sodium chloride 20 mL/hr (05/13/12 1245)  . DISCONTD: diltiazem (CARDIZEM) infusion 10 mg/hr (05/15/12 1146)    Scheduled Medications:    . atorvastatin  40 mg Oral q1800  . ciprofloxacin  400 mg Intravenous Q24H  . clindamycin (CLEOCIN) IV  900 mg Intravenous Q8H  . digoxin  0.25 mg Intravenous Q6H  . insulin aspart  0-20 Units Subcutaneous Q4H  . insulin aspart  4 Units Subcutaneous TID WC  . insulin glargine  8 Units Subcutaneous QHS  . linezolid  600 mg Intravenous Q12H  . sodium chloride  25 mL/kg Intravenous Once  . sodium chloride  500 mL Intravenous Once  . sodium chloride  500 mL Intravenous Once  . sodium chloride  750 mL Intravenous Once  . DISCONTD: amitriptyline  75 mg Oral QHS  . DISCONTD: aspirin EC  81 mg Oral Daily  . DISCONTD: clindamycin (CLEOCIN) IV  900 mg Intravenous Q6H  . DISCONTD: clindamycin (CLEOCIN) IV  900 mg Intravenous Q8H  . DISCONTD: dabigatran  150 mg Oral Q12H  . DISCONTD: dabigatran  75 mg Oral Q12H  . DISCONTD: DAPTOmycin (CUBICIN)  IV  340 mg Intravenous Q48H  . DISCONTD: diltiazem  120 mg Oral Daily  . DISCONTD: ferrous fumarate  1 tablet Oral BID  . DISCONTD: insulin aspart  0-20 Units Subcutaneous TID WC  . DISCONTD: insulin aspart  0-20 Units Subcutaneous Q4H  . DISCONTD: insulin aspart  0-5 Units  Subcutaneous QHS  . DISCONTD: insulin glargine  5 Units Subcutaneous QHS  . DISCONTD: levofloxacin (LEVAQUIN) IV  750 mg Intravenous Once  . DISCONTD: metFORMIN  1,000 mg Oral BID WC  . DISCONTD: metoprolol  50 mg Oral Daily  . DISCONTD: pantoprazole  80 mg Oral Q1200  . DISCONTD: piperacillin-tazobactam (ZOSYN)  IV  3.375 g Intravenous Q8H  . DISCONTD: vancomycin  1,500 mg Intravenous Q24H    have reviewed scheduled and prn medications.  Physical Exam: General: Diffuse erythematous rash, no acute distress Heart: tachy, irreg Lungs: mostly clear Abdomen: distended slight Extremities: significant edema   I Assessment/ Plan: Pt is a 78 y.o. yo female who was admitted on 05/12/2012 with cellulitis. Hospitalization c/b Afib and also ARF in the setting of hypotension/ace  Assessment/Plan: 1. Acute kidney injury -  suspect hemodynamic due to relative hypotension which occurred after treatment for afib/RVR. Also had ACEI on board, which has been stopped. 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 Urine sediment unimpressive for any renal pathology. Renal function appears to be improving still.  I think tank is full, will minimize IVFs.  No indications for HD 2. Volume-  Seems that tank is full, will minimize IVF.  I think hyponatremia due to fluids as well.  No lasix yet 3. RLE cellulitis - abx switched due to AKI and new drug rash 4. Drug rash - cardiology says that diltiazem has sulfa cross-reactivity due to sulfur group, and dilitiazem has been associated with severe allergic skin reactions. Diltiazem was stopped earlier today. On symptomatic meds 5. Afib with RVR - on IV amio now, and IV dig.  Cards to see today 6. Anemia- not an issue right now 7. Hypokalemia- give some K replacement     Yousra Ivens A   05/16/2012,8:18 AM  LOS: 4 days

## 2012-05-16 NOTE — Consult Note (Signed)
Infectious Diseases Initial Consultation  Reason for Consultation:  Cellulitis in patient who is having possible SJS/drug fever/rash to vanco/piptazo   HPI: Pamela [name] is a 78 y.o. 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 She injured her right third toe by stubbing it on a bed post several weeks ago and sustained an ulceration to the tip. She was placed on a course of doxycycline which she completed about 7 days prior to admit. She was having increasing redness, swelling, pain in her right leg extending up towards the knee. She denies fevers at home. Her home health nurse sent her to the ER on 6/13 for evaluation. She has no pain in the bottom of her feet. She was started on vancomycin and piptazo on 6/13 and over the course of 2 days started to have worsened aki as well as diffuse urticarial rash. Her symptoms started after 2 days of getting IV antibiotics and her WBC was trending upwards from 5.5 to 8.4. On 6/16 she clinically decompensated by being solumnent, hypotensive from afib with RVR, febrile to 103.5, AKI with hyperkalemi of 5.3 and a metabolic acidosis. Her rash was more appearant at this time concerning for drug rash allergy thus she was changed to clinda, cipro and linezolid on 6/16. She was transferred to the ICU for further management. She has remainded febrile for the last 36hr with her afib with RVR difficult to manage.  The patient reports that she has had an episode of SJS 5 yrs ago, which was thought to be due to bactrim. She states that she did not have any skin slough off. Did not see an allergist in follow up. Has not had further exposure to bactrim. She states that she has had penicillin before without difficulty  All: sulfa - hx of SJS  Abtx: vanco 6/13-6/15 piptazo 6/13-6/15 cipro 6/16-  (for 2 doses) - 6/17 clinda 6/16  (for 4 doses)- 6/17  linezolid 6/16- present Aztreonam 6/17- present    . amiodarone  150 mg Intravenous Once  . atorvastatin  40 mg Oral q1800  . aztreonam  1 g Intravenous Q8H  . calcium carbonate  1,000 mg of elemental calcium Oral TID  . digoxin  0.25 mg Intravenous Q6H  . insulin aspart  0-20 Units Subcutaneous Q4H  . insulin aspart  4 Units Subcutaneous TID WC  . insulin glargine  8 Units Subcutaneous QHS  . linezolid  600 mg Intravenous Q12H  . potassium chloride  40 mEq Oral Once  . DISCONTD: ciprofloxacin  400 mg Intravenous Q24H  . DISCONTD: ciprofloxacin  400 mg Intravenous Q12H  . DISCONTD: clindamycin (CLEOCIN) IV  900 mg Intravenous Q8H  . DISCONTD: insulin aspart  0-20 Units Subcutaneous Q4H    Past Medical History  Diagnosis Date  . Diabetes mellitus   . Hypertension   . Dysrhythmia     History  Substance Use Topics  . Smoking status: Never Smoker   . Smokeless tobacco: Not on file  . Alcohol Use: No    History reviewed. No pertinent family history.  Review of Systems  Constitutional: Negative for fever, chills, diaphoresis, activity change, appetite change, fatigue and unexpected weight change.  HENT: Negative for congestion, sore throat, rhinorrhea, sneezing, trouble swallowing and sinus pressure.  Eyes: Negative for photophobia and visual disturbance.  Respiratory: Negative for cough, chest tightness, shortness of breath,  wheezing and stridor.  Cardiovascular: Negative for chest pain, palpitations and leg swelling.  Gastrointestinal: Negative for nausea, vomiting, abdominal pain, diarrhea, constipation, blood in stool, abdominal distention and anal bleeding.  Genitourinary: Negative for dysuria, hematuria, flank pain and difficulty urinating.  Musculoskeletal: Negative for myalgias, back pain, joint swelling, arthralgias and gait problem.  Skin: per HPI Neurological: positve for LE peripheral neuropathy from DM. Negative for dizziness, tremors,  weakness and light-headedness.  Hematological: Negative for adenopathy. Does not bruise/bleed easily.  Psychiatric/Behavioral: Negative for behavioral problems, confusion, sleep disturbance, dysphoric mood, decreased concentration and agitation.    OBJECTIVE: Temp:  [98.2 F (36.8 C)-103.1 F (39.5 C)] 100.3 F (37.9 C) (06/17 1130) Pulse Rate:  [118-142] 128  (06/17 1400) Resp:  [19-29] 24  (06/17 1400) BP: (72-127)/(46-74) 114/71 mmHg (06/17 1400) SpO2:  [94 %-100 %] 99 % (06/17 1400)   General Appearance:    Alert, cooperative, no distress, appears stated age  Head:    Normocephalic, without obvious abnormality, atraumatic  Eyes:    PERRL, conjunctiva/corneas clear, EOM's intact,     Nose:   Nares normal, septum midline, mucosa normal, no drainage    or sinus tenderness  Throat:   Lips, mucosa intack, and tongue normal; teeth and gums normal  Neck:   Supple, symmetrical, trachea midline, no adenopathy;         Lungs:     Clear to auscultation bilaterally, respirations unlabored  Chest Wall:    Diffuse blanching erythamatous rash   Heart:    Regular rate and rhythm, S1 and S2 normal, no murmur, rub   or gallop     Abdomen:     Soft, non-tender, bowel sounds active all four quadrants,    no masses, no organomegaly        Extremities:   +2 edema arms and legs  Pulses:   2+ and symmetric all extremities  Skin:   Diffuse erythamatous rash involving legs, torso, arms, sparing face. Left leg more involved that right leg.   Lymph nodes:   Cervical, supraclavicular, and axillary nodes normal  Neurologic:   CNII-XII intact, normal strength, sensation and reflexes    throughout   LABS: Lab Results  Component Value Date   WBC 15.2* 05/16/2012   HGB 12.6 05/16/2012   HCT 37.4 05/16/2012   MCV 89.7 05/16/2012   PLT 190 05/16/2012   BMET    Component Value Date/Time   NA 128* 05/16/2012 1400   K 4.0 05/16/2012 1400   CL 95* 05/16/2012 1400   CO2 21 05/16/2012 1400   GLUCOSE 235*  05/16/2012 1400   BUN 24* 05/16/2012 1400   CREATININE 1.25* 05/16/2012 1400   CALCIUM 7.2* 05/16/2012 1400   GFRNONAA 41* 05/16/2012 1400   GFRAA 47* 05/16/2012 1400    MICRO: 6/13 blood NGTD x 2 6/16 blood NGTD x 2  IMAGING: Ct Tibia Fibula Right Wo Contrast  05/15/2012  *RADIOLOGY REPORT*  Clinical Data: Worsening cellulitis.  Evaluate for abscess.  CT OF THE RIGHT TIBIA FIBULA WITHOUT CONTRAST  Comparison: 05/12/2012.  Findings: Diffuse subcutaneous edema is present in the leg extending through the ankle and hind foot.  No focal fluid collections are identified suspicious for abscess.  The anterior and posterior muscular compartments are within normal limits. Atherosclerosis of the below-the-knee small vessels.  Phleboliths are present in the soft tissues.  There is no fracture.  Moderate midfoot osteoarthritis is present.  Large subchondral geode is present in the posterior calcaneus adjacent to the  subtalar joint. This appears degenerative.  Achilles tendon appears intact. Posteromedial, posterolateral and anterior tendon groups appear within normal limits.  Osteoarthritis of the knee is incidentally noted.  Significantly, there is no gas in the soft tissues to suggest necrotizing fasciitis for infection with a gas-forming organism. No osteomyelitis.  IMPRESSION: Diffuse infiltration of the subcutaneous tissues compatible with cellulitis.  No abscess or gas in the soft tissues.  Osteoarthritis of the knee and foot.  Original Report Authenticated By: Andreas Newport, M.D.   US Renal Port  05/16/2012  *RADIOLOGY REPORT*  Clinical Data: 78 year old female with renal failure.  History of diabetes and hypertension.  RENAL/URINARY TRACT ULTRASOUND COMPLETE  Comparison:  09/04/2009 renal ultrasound from [facility].  Findings:  Right Kidney:  The right kidney is normal in echogenicity measuring 9.6 cm.  Moderate cortical atrophy is present.  There is no evidence of hydronephrosis, solid  renal mass or definite renal calculi.  Left Kidney:  The left kidney is normal in echogenicity measuring 11 cm.  Moderate cortical atrophy is present.  A 2.7 x 3.3 cm cyst within the lower left kidney is noted.  There is no evidence of hydronephrosis, solid mass or definite renal calculi.  Bladder:  A Foley catheter within the collapsed bladder is noted.  IMPRESSION: No evidence of acute abnormality.  Moderate bilateral renal cortical atrophy.  Left lower pole renal cyst again noted.  Foley catheter within the bladder.  Original Report Authenticated By: Rosendo Gros, M.D.   Dg Chest Port 1 View  05/16/2012  *RADIOLOGY REPORT*  Clinical Data: Shortness of breath.  Question pneumonia.  PORTABLE CHEST - 1 VIEW  Comparison: Chest 05/15/2012.  Findings: Right IJ catheter remains in place.  Lungs appear clear. No pneumothorax or pleural fluid.  Right costophrenic angle is just off the margin of film.  Heart size normal.  IMPRESSION: No acute finding.  Original Report Authenticated By: Bernadene Bell. Maricela Curet, M.D.   Dg Chest Port 1 View  05/15/2012  *RADIOLOGY REPORT*  Clinical Data: Right IJ central line placement  PORTABLE CHEST - 1 VIEW  Comparison: None.  Findings: Normal cardiac silhouette and mediastinal contours. Minimal atherosclerotic calcification within the aortic arch. Interval placement of a right jugular approach central venous catheter with tip overlying the distal SVC.  No pneumothorax. No focal airspace opacities.  No definite pneumothorax or pleural effusion.  Unchanged bones.  IMPRESSION: 1.  Right internal jugular approach central venous catheter tip overlies the distal SVC.  No pneumothorax. 2. No acute cardiopulmonary disease.  Original Report Authenticated By: Waynard Reeds, M.D.    HISTORICAL MICRO/IMAGING  Assessment/Plan:  77yo F with sulfa drug allergy, hx of SJS, presents with right leg cellulitis associated with diabetic foot ulcer who develops pruritic rash and systemic symptoms  while on vancomycin and piptazo. It is unclear which antibiotic is causing her diffuse rash, favoring vancomycin over piptazo since she reports taking penicillin without difficulty, and now on aztreonam and linezolid for treatment of cellulitis. This presentation is consistent with DRESS.  - check differential today and tomorrow to see is she has eosinophilia associated with drug rash/drug fever - will check urine eos -  Will also check LFTs to see if she is having DRESS (Drug Reaction/Rash with Eosinophilia and systemic symptoms) - continue with supportive care - for now keep on linezolid and aztreonam for cellulitis - fevers are likely sequelae of drug fever rather than a secondary infection - continue to pan culture urine, blood, sputum  if still having fevers of 100.3 or greater tomorrow.  Thank you for interesting consult.  Duke Salvia Drue Second MD MPH [facility] [phone]

## 2012-05-16 NOTE — Progress Notes (Signed)
eLink Physician-Brief Progress Note Patient Name: Pamela [name] DOB: Feb 05, 1934 MRN: [id number]  Date of Service  05/16/2012   HPI/Events of Note   Still oliguric. BP soft MAP 49. HR 157  eICU Interventions  1 more Liter fluid   Intervention Category Intermediate Interventions: Hypovolemia - evaluation and management  Aemilia Dedrick 05/16/2012, 9:28 PM

## 2012-05-16 NOTE — Progress Notes (Signed)
Subjective: Rash that is pruritic  Continued A.Flutter rate 130. Complains of not being able to keep warm, skin hot to touch.  Objective: Vital signs in last 24 hours: Temp:  [98.2 F (36.8 C)-103.5 F (39.7 C)] 98.3 F (36.8 C) (06/17 0400) Pulse Rate:  [118-164] 131  (06/17 0700) Resp:  [19-29] 22  (06/17 0700) BP: (72-152)/(40-82) 116/58 mmHg (06/17 0700) SpO2:  [93 %-100 %] 98 % (06/17 0700) Weight:  [88.6 kg (195 lb 5.2 oz)] 88.6 kg (195 lb 5.2 oz) (06/16 1600) Weight change:  Last BM Date: 05/14/12 Intake/Output from previous day: +3528 06/16 0701 - 06/17 0700 In: 5638.3 [I.V.:4188.3; IV Piggyback:1450] Out: 1880 [Urine:1880] Intake/Output this shift:   TEMP Axillary 102 PE: General:alertand oriented, follows commands, no chest pain no complaints of SOB, pruritis improved with Benadryl Skin:Rash mor prominent on upper ext. Papular rash, dark red. Very hot to touch now spiking fever. Heart:S1S2 RRR rapid Lungs:clear without rales, rhonchi or wheezes ZOX:WRUEA, + BS, soft non tender VWU:JWJX with rash, lt. Leg reddened, no edema, rt. Leg + edema but appears less than yesterday.     Neuro:alert and oriented X 3, follows commands   Lab Results:  Basename 05/16/12 0353 05/15/12 0620  WBC 15.2* 11.6*  HGB 12.6 12.0  HCT 37.4 35.8*  PLT 190 214   BMET  Basename 05/16/12 0154 05/15/12 2035  NA 127* 125*  K 3.4* 4.2  CL 98 95*  CO2 20 18*  GLUCOSE 196* 326*  BUN 25* 27*  CREATININE 1.26* 1.32*  CALCIUM 6.8* 7.4*    Basename 05/16/12 0353 05/15/12 1830  TROPONINI <0.30 <0.30    No results found for this basename: CHOL, HDL, LDLCALC, LDLDIRECT, TRIG, CHOLHDL   Lab Results  Component Value Date   HGBA1C 7.6* 05/13/2012     Lab Results  Component Value Date   TSH 0.306* 05/15/2012    Hepatic Function Panel No results found for this basename: PROT,ALBUMIN,AST,ALT,ALKPHOS,BILITOT,BILIDIR,IBILI in the last 72 hours No results found for this basename: CHOL in  the last 72 hours No results found for this basename: PROTIME in the last 72 hours    EKG: Orders placed during the hospital encounter of 05/12/12  . EKG 12-LEAD  . EKG 12-LEAD  . EKG 12-LEAD  . EKG 12-LEAD  . EKG 12-LEAD  . EKG 12-LEAD    Studies/Results: Ct Tibia Fibula Right Wo Contrast  05/15/2012  *RADIOLOGY REPORT*  Clinical Data: Worsening cellulitis.  Evaluate for abscess.  CT OF THE RIGHT TIBIA FIBULA WITHOUT CONTRAST  Comparison: 05/12/2012.  Findings: Diffuse subcutaneous edema is present in the leg extending through the ankle and hind foot.  No focal fluid collections are identified suspicious for abscess.  The anterior and posterior muscular compartments are within normal limits. Atherosclerosis of the below-the-knee small vessels.  Phleboliths are present in the soft tissues.  There is no fracture.  Moderate midfoot osteoarthritis is present.  Large subchondral geode is present in the posterior calcaneus adjacent to the subtalar joint. This appears degenerative.  Achilles tendon appears intact. Posteromedial, posterolateral and anterior tendon groups appear within normal limits.  Osteoarthritis of the knee is incidentally noted.  Significantly, there is no gas in the soft tissues to suggest necrotizing fasciitis for infection with a gas-forming organism. No osteomyelitis.  IMPRESSION: Diffuse infiltration of the subcutaneous tissues compatible with cellulitis.  No abscess or gas in the soft tissues.  Osteoarthritis of the knee and foot.  Original Report Authenticated By: Andreas Newport, M.D.  Dg Chest Port 1 View  05/16/2012  *RADIOLOGY REPORT*  Clinical Data: Shortness of breath.  Question pneumonia.  PORTABLE CHEST - 1 VIEW  Comparison: Chest 05/15/2012.  Findings: Right IJ catheter remains in place.  Lungs appear clear. No pneumothorax or pleural fluid.  Right costophrenic angle is just off the margin of film.  Heart size normal.  IMPRESSION: No acute finding.  Original Report  Authenticated By: Bernadene Bell. Maricela Curet, M.D.   Dg Chest Port 1 View  05/15/2012  *RADIOLOGY REPORT*  Clinical Data: Right IJ central line placement  PORTABLE CHEST - 1 VIEW  Comparison: None.  Findings: Normal cardiac silhouette and mediastinal contours. Minimal atherosclerotic calcification within the aortic arch. Interval placement of a right jugular approach central venous catheter with tip overlying the distal SVC.  No pneumothorax. No focal airspace opacities.  No definite pneumothorax or pleural effusion.  Unchanged bones.  IMPRESSION: 1.  Right internal jugular approach central venous catheter tip overlies the distal SVC.  No pneumothorax. 2. No acute cardiopulmonary disease.  Original Report Authenticated By: Waynard Reeds, M.D.    Medications: I have reviewed the patient's current medications.    Marland Kitchen atorvastatin  40 mg Oral q1800  . ciprofloxacin  400 mg Intravenous Q24H  . clindamycin (CLEOCIN) IV  900 mg Intravenous Q8H  . digoxin  0.25 mg Intravenous Q6H  . insulin aspart  0-20 Units Subcutaneous Q4H  . insulin aspart  4 Units Subcutaneous TID WC  . insulin glargine  8 Units Subcutaneous QHS  . linezolid  600 mg Intravenous Q12H  . sodium chloride  25 mL/kg Intravenous Once  . sodium chloride  500 mL Intravenous Once  . sodium chloride  500 mL Intravenous Once  . sodium chloride  750 mL Intravenous Once  . DISCONTD: amitriptyline  75 mg Oral QHS  . DISCONTD: aspirin EC  81 mg Oral Daily  . DISCONTD: clindamycin (CLEOCIN) IV  900 mg Intravenous Q6H  . DISCONTD: clindamycin (CLEOCIN) IV  900 mg Intravenous Q8H  . DISCONTD: dabigatran  150 mg Oral Q12H  . DISCONTD: dabigatran  75 mg Oral Q12H  . DISCONTD: DAPTOmycin (CUBICIN)  IV  340 mg Intravenous Q48H  . DISCONTD: diltiazem  120 mg Oral Daily  . DISCONTD: ferrous fumarate  1 tablet Oral BID  . DISCONTD: insulin aspart  0-20 Units Subcutaneous TID WC  . DISCONTD: insulin aspart  0-20 Units Subcutaneous Q4H  . DISCONTD:  insulin aspart  0-5 Units Subcutaneous QHS  . DISCONTD: insulin glargine  5 Units Subcutaneous QHS  . DISCONTD: levofloxacin (LEVAQUIN) IV  750 mg Intravenous Once  . DISCONTD: metFORMIN  1,000 mg Oral BID WC  . DISCONTD: metoprolol  50 mg Oral Daily  . DISCONTD: pantoprazole  80 mg Oral Q1200  . DISCONTD: piperacillin-tazobactam (ZOSYN)  IV  3.375 g Intravenous Q8H  . DISCONTD: vancomycin  1,500 mg Intravenous Q24H   Assessment/Plan: Patient Active Problem List  Diagnosis  . Cellulitis in diabetic foot  . DM (diabetes mellitus)  . HTN (hypertension)  . Hypercholesterolemia  . Afib, paroxysmal, with RVR at times with BBB  . Anemia  . Diastolic dysfunction, left ventricle  . Acute kidney failure  . Allergic drug rash  . Stevens-Johnson syndrome  . Septic shock   PLAN: IV Heparin, IV amiodarone at .5 mg/hr, IV phenylephrine.  NaBicarb drip at 75,  NS at 100cc/hr.   Negative cardiac enzymes.   Check EKG, ? Add beta blocker. Rate continues to be  120 to 140, A. Flutter with BBB intermittent . S.Cr. Improving.   LOS: 4 days   INGOLD,LAURA R 05/16/2012, 8:11 AM  I have seen and examined the patient along with [facility] R, NP.  I have reviewed the chart, notes and new data.  I agree with NP's note.  Key new complaints: much more alert today; pruritus due to extensive rash, no angina or dyspnea at rest Key examination changes: Intermittent high spiking fever; remains markedly tachycardic, and was apparently hypotensive so vasopressors started last night Key new findings / data: improved renal function and mild improvement in Na level  I think ECG shows atrial flutter with 2:1 AV block, persistent LBBB  PLAN: Continue amiodarone IV. Repeat bolus and increase to 1 mg/min for another 6 h. Avoid diltiazem due to possible sulfa cross reactivity (hx of sulfa related Levonne Spiller sd.) and low BP. Beta blockers may be counter productive in setting of sepsis/hypotension. Can try  digoxin, but have to carefully monitor levels with concomitant amiodarone rx and volatile renal function. If hemodynamically unstable, recommend immediate cardioversion. Elective cardioversion is considered, but would have to be preceded by TEE since subjective palpitations predate admission and anticoagulation by several days.  Thurmon Fair, MD, [facility] [phone] 05/16/2012, 8:47 AM

## 2012-05-16 NOTE — Progress Notes (Addendum)
ANTICOAGULATION CONSULT NOTE - Follow Up Consult  Pharmacy Consult for heaprin Indication: atrial fibrillation  Allergies  Allergen Reactions  . Sulfa Antibiotics     unknown    Patient Measurements: Height: 5\' 5"  (165.1 cm) Weight: 195 lb 5.2 oz (88.6 kg) IBW/kg (Calculated) : 57  Heparin Dosing Weight: 75kg  Vital Signs: Temp: 102.8 F (39.3 C) (06/17 0800) Temp src: Axillary (06/17 0800) BP: 115/64 mmHg (06/17 0906) Pulse Rate: 138  (06/17 0906)  Labs:  Basename 05/16/12 0800 05/16/12 0353 05/16/12 0154 05/15/12 2306 05/15/12 2035 05/15/12 1830 05/15/12 1500 05/15/12 0620 05/14/12 0632  HGB -- 12.6 -- -- -- -- -- 12.0 --  HCT -- 37.4 -- -- -- -- -- 35.8* 37.0  PLT -- 190 -- -- -- -- -- 214 181  APTT -- -- -- -- -- -- -- -- --  LABPROT -- -- -- -- -- -- -- -- --  INR -- -- -- -- -- -- -- -- --  HEPARINUNFRC 0.55 -- -- 0.18* -- -- -- -- --  CREATININE 0.93 -- 1.26* -- 1.32* -- -- -- --  CKTOTAL -- 44 -- -- -- 44 38 -- --  CKMB -- 1.5 -- -- -- 1.4 1.4 -- --  TROPONINI -- <0.30 -- -- -- <0.30 <0.30 -- --    Estimated Creatinine Clearance: 56.5 ml/min (by C-G formula based on Cr of 0.93).   Medications:  Scheduled:    . amiodarone  150 mg Intravenous Once  . atorvastatin  40 mg Oral q1800  . calcium carbonate  1,000 mg of elemental calcium Oral TID  . ciprofloxacin  400 mg Intravenous Q24H  . clindamycin (CLEOCIN) IV  900 mg Intravenous Q8H  . digoxin  0.25 mg Intravenous Q6H  . insulin aspart  0-20 Units Subcutaneous Q4H  . insulin aspart  4 Units Subcutaneous TID WC  . insulin glargine  8 Units Subcutaneous QHS  . linezolid  600 mg Intravenous Q12H  . potassium chloride  40 mEq Oral Once  . sodium chloride  25 mL/kg Intravenous Once  . sodium chloride  500 mL Intravenous Once  . sodium chloride  500 mL Intravenous Once  . sodium chloride  750 mL Intravenous Once  . DISCONTD: amitriptyline  75 mg Oral QHS  . DISCONTD: aspirin EC  81 mg Oral Daily  .  DISCONTD: clindamycin (CLEOCIN) IV  900 mg Intravenous Q6H  . DISCONTD: clindamycin (CLEOCIN) IV  900 mg Intravenous Q8H  . DISCONTD: dabigatran  75 mg Oral Q12H  . DISCONTD: DAPTOmycin (CUBICIN)  IV  340 mg Intravenous Q48H  . DISCONTD: ferrous fumarate  1 tablet Oral BID  . DISCONTD: insulin aspart  0-20 Units Subcutaneous TID WC  . DISCONTD: insulin aspart  0-20 Units Subcutaneous Q4H  . DISCONTD: insulin aspart  0-5 Units Subcutaneous QHS  . DISCONTD: levofloxacin (LEVAQUIN) IV  750 mg Intravenous Once  . DISCONTD: metoprolol  50 mg Oral Daily  . DISCONTD: pantoprazole  80 mg Oral Q1200    Assessment: 78 yo female with aflutter on heparin and at goal. Hg=12.6, plt=190 (stable). Noted plans for possible TEE with cardioversion.  Goal of Therapy:  Heparin level 0.3-0.7 units/ml Monitor platelets by anticoagulation protocol: Yes   Plan:  -Will re-check a level today (prior heparin level=0.16) -CBC and heparin level daily -Will change cipro to 400mg  IV q12h (SCr=0.93 with decreasing trend)  Harland German, Pharm D 05/16/2012 9:40 AM  addendum -heparin level re-check was 0.56 -No heparin changes  needed  Harland German, Pharm D 05/16/2012 2:15 PM

## 2012-05-16 NOTE — Progress Notes (Signed)
eLink Physician-Brief Progress Note Patient Name: Pamela [name] DOB: [birth year] MRN: [id number]  Date of Service  05/16/2012   HPI/Events of Note  RN says   - since fluids decreased by renal team, ur op less and urine darker  - Still febrile - still on pressors  - BUN increasing  - CVP 8 at noon  - Na 128  All above fit in with hypovolemia  eICU Interventions  Fluid bolus 1L and reassess (ef 65%)   Intervention Category Intermediate Interventions: Oliguria - evaluation and management  Carlito Bogert 05/16/2012, 5:40 PM

## 2012-05-16 NOTE — Progress Notes (Signed)
ANTIBIOTIC CONSULT NOTE - INITIAL  Pharmacy Consult for azactam Indication: Cellulitis  Allergies  Allergen Reactions  . Sulfa Antibiotics     unknown    Patient Measurements: Height: 5\' 5"  (165.1 cm) Weight: 195 lb 5.2 oz (88.6 kg) IBW/kg (Calculated) : 57   Vital Signs: Temp: 100.3 F (37.9 C) (06/17 1130) Temp src: Axillary (06/17 1130) BP: 114/71 mmHg (06/17 1400) Pulse Rate: 128  (06/17 1400) Intake/Output from previous day: 06/16 0701 - 06/17 0700 In: 5638.3 [I.V.:4188.3; IV Piggyback:1450] Out: 1880 [Urine:1880] Intake/Output from this shift: Total I/O In: 1614.6 [P.O.:300; I.V.:814.6; IV Piggyback:500] Out: 100 [Urine:100]  Labs:  Basename 05/16/12 0800 05/16/12 0353 05/16/12 0154 05/15/12 2214 05/15/12 2035 05/15/12 1337 05/15/12 0620 05/14/12 0632  WBC -- 15.2* -- -- -- -- 11.6* 8.4  HGB -- 12.6 -- -- -- -- 12.0 11.9*  PLT -- 190 -- -- -- -- 214 181  LABCREA -- -- -- 104.93 -- 91.00 -- --  CREATININE 0.93 -- 1.26* -- 1.32* -- -- --   Estimated Creatinine Clearance: 56.5 ml/min (by C-G formula based on Cr of 0.93).   Medications:  Scheduled:    . amiodarone  150 mg Intravenous Once  . atorvastatin  40 mg Oral q1800  . aztreonam  1 g Intravenous Q8H  . calcium carbonate  1,000 mg of elemental calcium Oral TID  . digoxin  0.25 mg Intravenous Q6H  . insulin aspart  0-20 Units Subcutaneous Q4H  . insulin aspart  4 Units Subcutaneous TID WC  . insulin glargine  8 Units Subcutaneous QHS  . linezolid  600 mg Intravenous Q12H  . potassium chloride  40 mEq Oral Once  . sodium chloride  500 mL Intravenous Once  . DISCONTD: ciprofloxacin  400 mg Intravenous Q24H  . DISCONTD: ciprofloxacin  400 mg Intravenous Q12H  . DISCONTD: clindamycin (CLEOCIN) IV  900 mg Intravenous Q8H  . DISCONTD: insulin aspart  0-20 Units Subcutaneous Q4H   Assessment:    78 yof with RLE cellulitis on cipro/zyvox and clindamycin. Patient also noted on amiodarone (possible risk for  QTc prolongation with cipro and noted with QTc=555 on 05/15/2012).      Spoke with CCM- will d/c cipro and changes to azactam. Will also d/c clindamycin as patient on Zyvox and this will serve role for  toxin inhibition.  Antibiotics:  6/13 Zosyn >> 6/16 6/13 Vancomycin >> 6/16 6/16 Linezolid, Cipro, Clindamycin >> (on Cipro PTA)   Cx: 6/16: blood: ngtd 6/13: Blood: ngtd  Plan:  -D/c cipro and d/c clindamycin  -Azactam 1gm IV q8hr  Harland German, Pharm D 05/16/2012 2:59 PM

## 2012-05-17 ENCOUNTER — Inpatient Hospital Stay (HOSPITAL_COMMUNITY): Payer: Medicare Other

## 2012-05-17 DIAGNOSIS — L511 Stevens-Johnson syndrome: Secondary | ICD-10-CM

## 2012-05-17 DIAGNOSIS — M79609 Pain in unspecified limb: Secondary | ICD-10-CM

## 2012-05-17 LAB — BASIC METABOLIC PANEL
BUN: 23 mg/dL (ref 6–23)
BUN: 22 mg/dL (ref 6–23)
BUN: 21 mg/dL (ref 6–23)
BUN: 20 mg/dL (ref 6–23)
CO2: 20 mEq/L (ref 19–32)
Calcium: 7.4 mg/dL — ABNORMAL LOW (ref 8.4–10.5)
Calcium: 7.2 mg/dL — ABNORMAL LOW (ref 8.4–10.5)
Calcium: 7.7 mg/dL — ABNORMAL LOW (ref 8.4–10.5)
Chloride: 98 mEq/L (ref 96–112)
Creatinine, Ser: 1.14 mg/dL — ABNORMAL HIGH (ref 0.50–1.10)
Creatinine, Ser: 1.14 mg/dL — ABNORMAL HIGH (ref 0.50–1.10)
Creatinine, Ser: 1.12 mg/dL — ABNORMAL HIGH (ref 0.50–1.10)
GFR calc Af Amer: 51 mL/min — ABNORMAL LOW (ref >90)
GFR calc Af Amer: 53 mL/min — ABNORMAL LOW (ref >90)
GFR calc non Af Amer: 46 mL/min — ABNORMAL LOW (ref >90)
GFR calc non Af Amer: 46 mL/min — ABNORMAL LOW (ref >90)
GFR calc non Af Amer: 46 mL/min — ABNORMAL LOW (ref >90)
Glucose, Bld: 155 mg/dL — ABNORMAL HIGH (ref 70–99)
Glucose, Bld: 190 mg/dL — ABNORMAL HIGH (ref 70–99)
Glucose, Bld: 178 mg/dL — ABNORMAL HIGH (ref 70–99)
Potassium: 4 mEq/L (ref 3.5–5.1)
Potassium: 4.3 mEq/L (ref 3.5–5.1)
Potassium: 4.7 mEq/L (ref 3.5–5.1)
Sodium: 133 mEq/L — ABNORMAL LOW (ref 135–145)
Sodium: 131 mEq/L — ABNORMAL LOW (ref 135–145)

## 2012-05-17 LAB — CBC
HCT: 33.2 % — ABNORMAL LOW (ref 36.0–46.0)
MCH: 30.4 pg (ref 26.0–34.0)
MCHC: 34 g/dL (ref 30.0–36.0)
MCV: 89.2 fL (ref 78.0–100.0)
RDW: 14.9 % (ref 11.5–15.5)

## 2012-05-17 LAB — HEPATIC FUNCTION PANEL
ALT: 11 U/L (ref 0–35)
Alkaline Phosphatase: 56 U/L (ref 39–117)
Bilirubin, Direct: <0.1 mg/dL (ref 0.0–0.3)
Total Bilirubin: 0.2 mg/dL — ABNORMAL LOW (ref 0.3–1.2)

## 2012-05-17 LAB — DIFFERENTIAL
Basophils Absolute: 0 10*3/uL (ref 0.0–0.1)
Eosinophils Absolute: 0.5 10*3/uL (ref 0.0–0.7)
Eosinophils Relative: 7 % — ABNORMAL HIGH (ref 0–5)
Lymphs Abs: 0.8 10*3/uL (ref 0.7–4.0)
Monocytes Absolute: 0.3 10*3/uL (ref 0.1–1.0)

## 2012-05-17 LAB — GLUCOSE, CAPILLARY
Glucose-Capillary: 140 mg/dL — ABNORMAL HIGH (ref 70–99)
Glucose-Capillary: 143 mg/dL — ABNORMAL HIGH (ref 70–99)
Glucose-Capillary: 165 mg/dL — ABNORMAL HIGH (ref 70–99)
Glucose-Capillary: 189 mg/dL — ABNORMAL HIGH (ref 70–99)

## 2012-05-17 LAB — PHOSPHORUS: Phosphorus: 2.5 mg/dL (ref 2.3–4.6)

## 2012-05-17 LAB — MAGNESIUM: Magnesium: 1.3 mg/dL — ABNORMAL LOW (ref 1.5–2.5)

## 2012-05-17 MED ORDER — AMITRIPTYLINE HCL 50 MG PO TABS: 50.0000 mg | ORAL_TABLET | Freq: Every evening | ORAL | Status: DC | PRN

## 2012-05-17 MED ORDER — ZOLPIDEM TARTRATE 5 MG PO TABS
5.0000 mg | ORAL_TABLET | Freq: Every evening | ORAL | Status: DC | PRN
  Administered 2012-05-17 – 2012-05-19 (×2): 5 mg via ORAL
  Filled 2012-05-17 (×2): qty 1

## 2012-05-17 MED ORDER — MAGNESIUM SULFATE 40 MG/ML IJ SOLN
2.0000 g | Freq: Once | INTRAMUSCULAR | Status: AC
  Administered 2012-05-17: 2 g via INTRAVENOUS
  Filled 2012-05-17: qty 50

## 2012-05-17 MED ORDER — AMITRIPTYLINE HCL 75 MG PO TABS
75.0000 mg | ORAL_TABLET | Freq: Once | ORAL | Status: AC
  Administered 2012-05-17: 75 mg via ORAL
  Filled 2012-05-17: qty 1

## 2012-05-17 MED ORDER — POTASSIUM CHLORIDE CRYS ER 20 MEQ PO TBCR
40.0000 meq | EXTENDED_RELEASE_TABLET | Freq: Once | ORAL | Status: AC
  Administered 2012-05-17: 40 meq via ORAL
  Filled 2012-05-17: qty 2

## 2012-05-17 NOTE — Plan of Care (Signed)
Problem: Food- and Nutrition-Related Knowledge Deficit (NB-1.1) Goal: Nutrition education Formal process to instruct or train a patient/client in a skill or to impart knowledge to help patients/clients voluntarily manage or modify food choices and eating behavior to maintain or improve health.  Outcome: Completed/Met Date Met:  05/17/12 RD pulled to pt for drug nutrient interaction. Pt currently on zyvox, requires a low tyramine diet. Pt was provided with Low Tyramine Nutrition handout from [employer]. RD went over foods high in tyramine and how her diet would change here in the hospital. RD answered all questions. RD will have diet changed to include Low Tyramine restrictions in addition to current diet, carb mod.  Chart reviewed, no additional nutrition interventions at this time.   [name]

## 2012-05-17 NOTE — Progress Notes (Signed)
eLink Physician-Brief Progress Note Patient Name: Pamela [name] DOB: 10/30/1934 MRN: [id number]  Date of Service  05/17/2012   HPI/Events of Note  Patient c/o of inability to rest.  Usually on elavil 75 mg qhs at home.   eICU Interventions  Appears to be no absolute contraindication to this drug.  Ordered times one for tonight.  Routine administration to be determined on AM rounds   Intervention Category Minor Interventions: Routine modifications to care plan (e.g. PRN medications for pain, fever)  Leodis Alcocer 05/17/2012, 2:38 AM

## 2012-05-17 NOTE — Progress Notes (Signed)
Subjective:  [name] lot more in than out overnight, good UOP.  Now in NSR Objective Vital signs in last 24 hours: Filed Vitals:   05/17/12 0400 05/17/12 0500 05/17/12 0600 05/17/12 0700  BP: 131/41 102/48 118/52 122/51  Pulse: 134 82 83 82  Temp: 98.6 F (37 C)     TempSrc: Oral     Resp: 21 22 19 19   Height:      Weight:      SpO2: 97% 96% 98% 96%   Weight change:   Intake/Output Summary (Last 24 hours) at 05/17/12 0755 Last data filed at 05/17/12 0700  Gross per 24 hour  Intake 5842.2 ml  Output   1790 ml  Net 4052.2 ml   Labs: Basic Metabolic Panel:  Lab 05/17/12 1610 05/17/12 0200 05/16/12 2042  NA 133* 130* 128*  K 3.7 4.0 4.4  CL 101 98 95*  CO2 21 20 19   GLUCOSE 143* 184* 181*  BUN 23 23 24*  CREATININE 1.14* 1.18* 1.16*  CALCIUM 7.4* 7.4* 7.0*  ALB -- -- --  PHOS 2.5 -- --   Liver Function Tests:  Lab 05/17/12 0515 05/16/12 2042  AST 12 17  ALT 11 14  ALKPHOS 56 59  BILITOT 0.2* 0.2*  PROT 4.0* 4.2*  ALBUMIN 1.4* 1.4*   No results found for this basename: LIPASE:3,AMYLASE:3 in the last 168 hours No results found for this basename: AMMONIA:3 in the last 168 hours CBC:  Lab 05/17/12 0519 05/16/12 2042 05/16/12 0353 05/15/12 0620 05/14/12 0632 05/13/12 0555 05/12/12 2122  WBC 7.7 -- 15.2* 11.6* -- -- --  NEUTROABS 6.0 8.7* -- -- -- -- 2.8  HGB 11.3* -- 12.6 12.0 -- -- --  HCT 33.2* -- 37.4 35.8* -- -- --  MCV 89.2 -- 89.7 91.1 92.3 92.4 --  PLT 175 -- 190 214 -- -- --   Cardiac Enzymes:  Lab 05/16/12 0353 05/15/12 1830 05/15/12 1500 05/15/12 1125 05/15/12 1015  CKTOTAL 44 44 38 41 45  CKMB 1.5 1.4 1.4 0.9 --  CKMBINDEX -- -- -- -- --  TROPONINI <0.30 <0.30 <0.30 <0.30 --   CBG:  Lab 05/17/12 0734 05/17/12 0315 05/16/12 2341 05/16/12 2020 05/16/12 1652  GLUCAP 125* 178* 197* 203* 195*    Iron Studies: No results found for this basename: IRON,TIBC,TRANSFERRIN,FERRITIN in the last 72 hours Studies/Results: Ct Tibia Fibula Right Wo  Contrast  05/15/2012  *RADIOLOGY REPORT*  Clinical Data: Worsening cellulitis.  Evaluate for abscess.  CT OF THE RIGHT TIBIA FIBULA WITHOUT CONTRAST  Comparison: 05/12/2012.  Findings: Diffuse subcutaneous edema is present in the leg extending through the ankle and hind foot.  No focal fluid collections are identified suspicious for abscess.  The anterior and posterior muscular compartments are within normal limits. Atherosclerosis of the below-the-knee small vessels.  Phleboliths are present in the soft tissues.  There is no fracture.  Moderate midfoot osteoarthritis is present.  Large subchondral geode is present in the posterior calcaneus adjacent to the subtalar joint. This appears degenerative.  Achilles tendon appears intact. Posteromedial, posterolateral and anterior tendon groups appear within normal limits.  Osteoarthritis of the knee is incidentally noted.  Significantly, there is no gas in the soft tissues to suggest necrotizing fasciitis for infection with [name] gas-forming organism. No osteomyelitis.  IMPRESSION: Diffuse infiltration of the subcutaneous tissues compatible with cellulitis.  No abscess or gas in the soft tissues.  Osteoarthritis of the knee and foot.  Original Report Authenticated By: Andreas Newport, M.D.  US Renal Port  05/16/2012  *RADIOLOGY REPORT*  Clinical Data: 78 year old female with renal failure.  History of diabetes and hypertension.  RENAL/URINARY TRACT ULTRASOUND COMPLETE  Comparison:  09/04/2009 renal ultrasound from [facility].  Findings:  Right Kidney:  The right kidney is normal in echogenicity measuring 9.6 cm.  Moderate cortical atrophy is present.  There is no evidence of hydronephrosis, solid renal mass or definite renal calculi.  Left Kidney:  The left kidney is normal in echogenicity measuring 11 cm.  Moderate cortical atrophy is present.  [name] 2.7 x 3.3 cm cyst within the lower left kidney is noted.  There is no evidence of hydronephrosis, solid mass  or definite renal calculi.  Bladder:  [name] Foley catheter within the collapsed bladder is noted.  IMPRESSION: No evidence of acute abnormality.  Moderate bilateral renal cortical atrophy.  Left lower pole renal cyst again noted.  Foley catheter within the bladder.  Original Report Authenticated By: Rosendo Gros, M.D.   Dg Chest Port 1 View  05/17/2012  *RADIOLOGY REPORT*  Clinical Data: Left lower lobe infiltrate, shortness of breath.  PORTABLE CHEST - 1 VIEW  Comparison: 05/16/2012  Findings: Increasing bibasilar airspace opacities, left greater than right, atelectasis or infiltrates.  Possible small left pleural effusion.  Heart is normal size.  Right central line is unchanged.  IMPRESSION: Increasing bibasilar atelectasis or infiltrates, left greater than right.  Suspect small left effusion.  Original Report Authenticated By: Cyndie Chime, M.D.   Dg Chest Port 1 View  05/16/2012  *RADIOLOGY REPORT*  Clinical Data: Shortness of breath.  Question pneumonia.  PORTABLE CHEST - 1 VIEW  Comparison: Chest 05/15/2012.  Findings: Right IJ catheter remains in place.  Lungs appear clear. No pneumothorax or pleural fluid.  Right costophrenic angle is just off the margin of film.  Heart size normal.  IMPRESSION: No acute finding.  Original Report Authenticated By: Bernadene Bell. Maricela Curet, M.D.   Dg Chest Port 1 View  05/15/2012  *RADIOLOGY REPORT*  Clinical Data: Right IJ central line placement  PORTABLE CHEST - 1 VIEW  Comparison: None.  Findings: Normal cardiac silhouette and mediastinal contours. Minimal atherosclerotic calcification within the aortic arch. Interval placement of [name] right jugular approach central venous catheter with tip overlying the distal SVC.  No pneumothorax. No focal airspace opacities.  No definite pneumothorax or pleural effusion.  Unchanged bones.  IMPRESSION: 1.  Right internal jugular approach central venous catheter tip overlies the distal SVC.  No pneumothorax. 2. No acute cardiopulmonary  disease.  Original Report Authenticated By: Waynard Reeds, M.D.   Medications: Infusions:    . sodium chloride 10 mL/hr at 05/15/12 1900  . sodium chloride 20 mL/hr at 05/16/12 0700  . amiodarone (NEXTERONE PREMIX) 360 mg/200 mL dextrose 1 mg/min (05/16/12 1306)   Followed by  . amiodarone (NEXTERONE PREMIX) 360 mg/200 mL dextrose 0.5 mg/min (05/16/12 2221)  . heparin 1,300 Units/hr (05/16/12 1145)  . DISCONTD: sodium chloride 100 mL/hr at 05/15/12 2018  . DISCONTD: amiodarone (NEXTERONE PREMIX) 360 mg/200 mL dextrose 30 mg/hr (05/15/12 2054)  . DISCONTD:  sodium bicarbonate infusion 1000 mL 75 mL/hr at 05/16/12 0124    Scheduled Medications:    . amiodarone  150 mg Intravenous Once  . amitriptyline  75 mg Oral Once  . atorvastatin  40 mg Oral q1800  . aztreonam  1 g Intravenous Q8H  . calcium carbonate  1,000 mg of elemental calcium Oral TID  . insulin aspart  0-20  Units Subcutaneous Q4H  . insulin aspart  4 Units Subcutaneous TID WC  . insulin glargine  8 Units Subcutaneous QHS  . linezolid  600 mg Intravenous Q12H  . magnesium sulfate 1 - 4 g bolus IVPB  2 g Intravenous Once  . potassium chloride  40 mEq Oral Once  . sodium chloride  1,000 mL Intravenous Once  . sodium chloride  1,000 mL Intravenous Once  . DISCONTD: ciprofloxacin  400 mg Intravenous Q24H  . DISCONTD: ciprofloxacin  400 mg Intravenous Q12H  . DISCONTD: clindamycin (CLEOCIN) IV  900 mg Intravenous Q8H    have reviewed scheduled and prn medications.  Physical Exam: General: Diffuse erythematous rash, no acute distress Heart: tachy, irreg Lungs: mostly clear Abdomen: distended slight Extremities: significant edema   I Assessment/ Plan: Pt is [name] 78 y.o. yo female who was admitted on 05/12/2012 with cellulitis. Hospitalization c/b Afib and also ARF in the setting of hypotension/ace  Assessment/Plan: 1. Acute kidney injury - suspect hemodynamic due to relative hypotension which occurred after treatment  for afib/RVR. Also had ACEI on board, which has been stopped. AIN  is less likely I believe. Urine sediment unimpressive for any renal pathology. Renal function improving and I anticipate now that in NSR will mobilize her extra fluid.  2. Volume-  Seems that tank is full, would minimize IVF.  Hyponatremia improved 3. RLE cellulitis - abx switched due to AKI and new drug rash 4. Drug rash - Many disciplines involved.  On symptomatic meds 5. Afib with RVR - now NSR 6. Anemia- not an issue right now 7. Hypokalemia- gave some K replacement yesterday, will give today as well  Renal will sign off- anticipate continued improvement and mobilization of extra fluid.  Call with questions.      Pamela [name]   05/17/2012,7:55 AM  LOS: 5 days

## 2012-05-17 NOTE — Progress Notes (Signed)
INFECTIOUS DISEASE PROGRESS NOTE  ID: Pamela [name] is a 78 y.o. female with diabetes complicated by peripheral neuropathy and diabetic foot ulcer presents with right leg cellulitis and develops diffuse full body rash while on 48hrs of vancomycin and piptazo. Concern for drug rash vs. Toxic shock (staph or strep) related rash   Subjective: Fever and chills to 102.8 this morning but now improved. Derm evaluated patient and feels that rash is less likely drug allergy rash.  Abtx:  linezolid Aztreonam  Medications:     . amitriptyline  75 mg Oral Once  . atorvastatin  40 mg Oral q1800  . aztreonam  1 g Intravenous Q8H  . calcium carbonate  1,000 mg of elemental calcium Oral TID  . insulin aspart  0-20 Units Subcutaneous Q4H  . insulin aspart  4 Units Subcutaneous TID WC  . insulin glargine  8 Units Subcutaneous QHS  . linezolid  600 mg Intravenous Q12H  . magnesium sulfate 1 - 4 g bolus IVPB  2 g Intravenous Once  . potassium chloride  40 mEq Oral Once  . sodium chloride  1,000 mL Intravenous Once  . sodium chloride  1,000 mL Intravenous Once     Objective: Vital signs in last 24 hours: Temp:  [97.2 F (36.2 C)-98.6 F (37 C)] 97.2 F (36.2 C) (06/18 0800) Pulse Rate:  [82-152] 82  (06/18 1600) Resp:  [16-28] 17  (06/18 1600) BP: (89-139)/(38-95) 113/52 mmHg (06/18 1600) SpO2:  [94 %-100 %] 99 % (06/18 1600) GEN= asleep, but easily awakened, in NAD HEENT: [place]/AT, Mucous membranes clear- no evidence of oral or ocular lesions/breakdown.  PULM= CTAB, no w/c/r Derm=RLE with diffuse erythema, warmth, and 2-3+ pitting edema.  LLE : distal mild erythema and mild edema.  RUE: erythema and purpura but not as warm or diffuse as compared to LUE.  LUE: edematous erythema and warmth, primarily on forearm. Purpuric patches on L wrist and palm   Lab Results  Basename 05/17/12 1345 05/17/12 0740 05/17/12 0519 05/16/12 0353  WBC -- -- 7.7 15.2*  HGB -- -- 11.3* 12.6  HCT -- --  33.2* 37.4  NA 131* 134* -- --  K 4.8 4.3 -- --  CL 100 103 -- --  CO2 20 21 -- --  BUN 21 22 -- --  CREATININE 1.19* 1.14* -- --  GLU -- -- -- --   Liver Panel  Basename 05/17/12 0515 05/16/12 2042  PROT 4.0* 4.2*  ALBUMIN 1.4* 1.4*  AST 12 17  ALT 11 14  ALKPHOS 56 59  BILITOT 0.2* 0.2*  BILIDIR <0.1 <0.1  IBILI NOT CALCULATED NOT CALCULATED   Sedimentation Rate No results found for this basename: ESRSEDRATE in the last 72 hours C-Reactive Protein No results found for this basename: CRP:2 in the last 72 hours  Microbiology: Recent Results (from the past 240 hour(s))  CULTURE, BLOOD (ROUTINE X 2)     Status: Normal (Preliminary result)   Collection Time   05/12/12  9:26 PM      Component Value Range Status Comment   Specimen Description BLOOD LEFT ARM   Final    Special Requests BOTTLES DRAWN AEROBIC AND ANAEROBIC   Final    Culture  Setup Time [id number]   Final    Culture     Final    Value:        BLOOD CULTURE RECEIVED NO GROWTH TO DATE CULTURE WILL BE HELD FOR 5 DAYS BEFORE ISSUING A FINAL NEGATIVE REPORT  Report Status PENDING   Incomplete   CULTURE, BLOOD (ROUTINE X 2)     Status: Normal (Preliminary result)   Collection Time   05/12/12  9:32 PM      Component Value Range Status Comment   Specimen Description BLOOD LEFT HAND   Final    Special Requests BOTTLES DRAWN AEROBIC ONLY   Final    Culture  Setup Time [id number]   Final    Culture     Final    Value:        BLOOD CULTURE RECEIVED NO GROWTH TO DATE CULTURE WILL BE HELD FOR 5 DAYS BEFORE ISSUING A FINAL NEGATIVE REPORT   Report Status PENDING   Incomplete   MRSA PCR SCREENING     Status: Normal   Collection Time   05/15/12 11:46 AM      Component Value Range Status Comment   MRSA by PCR NEGATIVE  NEGATIVE Final   CULTURE, BLOOD (ROUTINE X 2)     Status: Normal (Preliminary result)   Collection Time   05/15/12 12:27 PM      Component Value Range Status Comment   Specimen Description  BLOOD LEFT FOOT   Final    Special Requests BOTTLES DRAWN AEROBIC ONLY 2CC   Final    Culture  Setup Time [id number]   Final    Culture     Final    Value:        BLOOD CULTURE RECEIVED NO GROWTH TO DATE CULTURE WILL BE HELD FOR 5 DAYS BEFORE ISSUING A FINAL NEGATIVE REPORT   Report Status PENDING   Incomplete   CULTURE, BLOOD (ROUTINE X 2)     Status: Normal (Preliminary result)   Collection Time   05/15/12 12:35 PM      Component Value Range Status Comment   Specimen Description BLOOD LEFT FOOT   Final    Special Requests BOTTLES DRAWN AEROBIC ONLY 8CC   Final    Culture  Setup Time [id number]   Final    Culture     Final    Value:        BLOOD CULTURE RECEIVED NO GROWTH TO DATE CULTURE WILL BE HELD FOR 5 DAYS BEFORE ISSUING A FINAL NEGATIVE REPORT   Report Status PENDING   Incomplete     Studies/Results: US Renal Port  05/16/2012  *RADIOLOGY REPORT*  Clinical Data: 78 year old female with renal failure.  History of diabetes and hypertension.  RENAL/URINARY TRACT ULTRASOUND COMPLETE  Comparison:  09/04/2009 renal ultrasound from [facility].  Findings:  Right Kidney:  The right kidney is normal in echogenicity measuring 9.6 cm.  Moderate cortical atrophy is present.  There is no evidence of hydronephrosis, solid renal mass or definite renal calculi.  Left Kidney:  The left kidney is normal in echogenicity measuring 11 cm.  Moderate cortical atrophy is present.  A 2.7 x 3.3 cm cyst within the lower left kidney is noted.  There is no evidence of hydronephrosis, solid mass or definite renal calculi.  Bladder:  A Foley catheter within the collapsed bladder is noted.  IMPRESSION: No evidence of acute abnormality.  Moderate bilateral renal cortical atrophy.  Left lower pole renal cyst again noted.  Foley catheter within the bladder.  Original Report Authenticated By: Rosendo Gros, M.D.   Dg Chest Port 1 View  05/17/2012  *RADIOLOGY REPORT*  Clinical Data: Left lower lobe  infiltrate, shortness of breath.  PORTABLE CHEST - 1 VIEW  Comparison: 05/16/2012  Findings: Increasing bibasilar airspace opacities, left greater than right, atelectasis or infiltrates.  Possible small left pleural effusion.  Heart is normal size.  Right central line is unchanged.  IMPRESSION: Increasing bibasilar atelectasis or infiltrates, left greater than right.  Suspect small left effusion.  Original Report Authenticated By: Cyndie Chime, M.D.   Dg Chest Port 1 View  05/16/2012  *RADIOLOGY REPORT*  Clinical Data: Shortness of breath.  Question pneumonia.  PORTABLE CHEST - 1 VIEW  Comparison: Chest 05/15/2012.  Findings: Right IJ catheter remains in place.  Lungs appear clear. No pneumothorax or pleural fluid.  Right costophrenic angle is just off the margin of film.  Heart size normal.  IMPRESSION: No acute finding.  Original Report Authenticated By: Bernadene Bell. Maricela Curet, M.D.     Assessment/Plan: Febrile = please repeat blood cultures if she is having fever > 100.3. No positive blood cultures. If she still has persistent fevers tomorrow would change aztreonam for cipro so that she has better pseudomonal coverage. Rash does not seem consistent with that associated with pseudomonas.  Leukocytosis = improving  Rash = unchanged. Chest and upper extremities marginally improved. Lower extremities and arms are same degree of erythema and swelling. I agree that this is not consistent with TEN/SJS. She does not have any significant eosinophilia on yesterday's lab, only 7% , roughly 2 days after initial decompensation. Being on vancomycin and piptazo in theory should be excellent coverage for staph or strep toxic shock, so it is odd that her symptoms worsened while on these antibiotics. Continue with topical steroid cream.  Left leg cellulitis= still edematous, continue with linezolid and aztreonam.   Hx of SJS= would get old records from [facility] to read discharge summary from 2008 to understand  details of event.   Prospero Mahnke Infectious Diseases 05/17/2012, 4:35 PM

## 2012-05-17 NOTE — Progress Notes (Signed)
[facility]  DAILY PROGRESS NOTE   Subjective:  Continues to complain of itchy rash. Slept better last night as she is getting amitriptyline again which she apparently has been getting for years.  Objective:  Temp:  [98.5 F (36.9 C)-102.8 F (39.3 C)] 98.6 F (37 C) (06/18 0400) Pulse Rate:  [82-152] 82  (06/18 0700) Resp:  [17-28] 19  (06/18 0700) BP: (82-132)/(38-95) 122/51 mmHg (06/18 0700) SpO2:  [94 %-100 %] 96 % (06/18 0700) Weight change:   Intake/Output from previous day: 06/17 0701 - 06/18 0700 In: 5842.2 [P.O.:420; I.V.:2472.2; IV Piggyback:2950] Out: 1790 [Urine:1790]  Intake/Output from this shift:    Medications: Current Facility-Administered Medications  Medication Dose Route Frequency Provider Last Rate Last Dose  . 0.9 %  sodium chloride infusion   Intravenous Continuous PRN Nelda Bucks, MD 10 mL/hr at 05/15/12 1900    . 0.9 %  sodium chloride infusion   Intravenous Continuous Nelda Bucks, MD 20 mL/hr at 05/16/12 0700    . acetaminophen (TYLENOL) tablet 650 mg  650 mg Oral Q6H PRN Sorin Luanne Bras, MD   650 mg at 05/17/12 0021  . amiodarone (NEXTERONE PREMIX) 360 mg/200 mL dextrose IV infusion  1 mg/min Intravenous Continuous Mihai Croitoru, MD 33.3 mL/hr at 05/16/12 1306 1 mg/min at 05/16/12 1306   Followed by  . amiodarone (NEXTERONE PREMIX) 360 mg/200 mL dextrose IV infusion  0.5 mg/min Intravenous Continuous Mihai Croitoru, MD 16.7 mL/hr at 05/16/12 2221 0.5 mg/min at 05/16/12 2221  . amiodarone (NEXTERONE) IV bolus only 150 mg/100 mL  150 mg Intravenous Once Thurmon Fair, MD   150 mg at 05/16/12 0902  . amitriptyline (ELAVIL) tablet 75 mg  75 mg Oral Once Zigmund Gottron, MD   75 mg at 05/17/12 0310  . atorvastatin (LIPITOR) tablet 40 mg  40 mg Oral q1800 Houston Siren, MD   40 mg at 05/16/12 1811  . aztreonam (AZACTAM) 1 g in dextrose 5 % 50 mL IVPB  1 g Intravenous Q8H Benny Lennert, PHARMD   1 g at 05/17/12  0526  . calcium carbonate (OS-CAL - dosed in mg of elemental calcium) tablet 1,000 mg of elemental calcium  1,000 mg of elemental calcium Oral TID Cecille Aver, MD   1,000 mg of elemental calcium at 05/16/12 2159  . camphor-menthol (SARNA) lotion   Topical PRN Zigmund Gottron, MD      . diphenhydrAMINE (BENADRYL) injection 12.5 mg  12.5 mg Intravenous Q8H PRN Zigmund Gottron, MD   12.5 mg at 05/16/12 1525  . heparin ADULT infusion 100 units/mL (25000 units/250 mL)  1,300 Units/hr Intravenous Continuous Colleen Can, PHARMD 13 mL/hr at 05/16/12 1145 1,300 Units/hr at 05/16/12 1145  . insulin aspart (novoLOG) injection 0-20 Units  0-20 Units Subcutaneous Q4H Nelda Bucks, MD   4 Units at 05/17/12 [phone]  . insulin aspart (novoLOG) injection 4 Units  4 Units Subcutaneous TID WC Houston Siren, MD   4 Units at 05/15/12 1630  . insulin glargine (LANTUS) injection 8 Units  8 Units Subcutaneous QHS Sorin Luanne Bras, MD   8 Units at 05/16/12 2200  . linezolid (ZYVOX) IVPB 600 mg  600 mg Intravenous Q12H Simonne Martinet, NP   600 mg at 05/16/12 2200  . magnesium sulfate IVPB 2 g 50 mL  2 g Intravenous Once Zigmund Gottron, MD   2 g at 05/17/12 0700  . ondansetron (ZOFRAN) tablet 4 mg  4 mg Oral Q6H PRN Houston Siren, MD       Or  . ondansetron [facility]) injection 4 mg  4 mg Intravenous Q6H PRN Houston Siren, MD   4 mg at 05/16/12 2035  . phenylephrine (NEO-SYNEPHRINE) 40,000 mcg in dextrose 5 % 250 mL infusion  30-200 mcg/min Intravenous PRN Nelda Bucks, MD 9.4 mL/hr at 05/17/12 0742 25 mcg/min at 05/17/12 0742  . potassium chloride SA (K-DUR,KLOR-CON) CR tablet 40 mEq  40 mEq Oral Once Cecille Aver, MD   40 mEq at 05/16/12 0857  . sodium chloride 0.9 % bolus 1,000 mL  1,000 mL Intravenous Once Kalman Shan, MD   1,000 mL at 05/16/12 1800  . sodium chloride 0.9 % bolus 1,000 mL  1,000 mL Intravenous Once Kalman Shan, MD   1,000 mL at 05/16/12 2130  . sodium chloride  0.9 % bolus 500 mL  500 mL Intravenous Q30 min PRN Simonne Martinet, NP      . DISCONTD: 0.9 %  sodium chloride infusion   Intravenous Continuous Simonne Martinet, NP 100 mL/hr at 05/15/12 2018    . DISCONTD: amiodarone (NEXTERONE PREMIX) 360 mg/200 mL dextrose IV infusion  60 mg/hr Intravenous Continuous Mihai Croitoru, MD 16.7 mL/hr at 05/15/12 2054 30 mg/hr at 05/15/12 2054  . DISCONTD: ciprofloxacin (CIPRO) IVPB 400 mg  400 mg Intravenous Q24H Wyline Copas, PHARMD   400 mg at 05/16/12 1258  . DISCONTD: ciprofloxacin (CIPRO) IVPB 400 mg  400 mg Intravenous Q12H Benny Lennert, PHARMD      . DISCONTD: clindamycin (CLEOCIN) IVPB 900 mg  900 mg Intravenous Q8H Wyline Copas, PHARMD   900 mg at 05/16/12 1447  . DISCONTD: sodium bicarbonate 150 mEq in dextrose 5 % 1,000 mL infusion   Intravenous Continuous Simonne Martinet, NP 75 mL/hr at 05/16/12 0124      Physical Exam: General appearance: alert and no distress Neck: no adenopathy, no carotid bruit, no JVD, supple, symmetrical, trachea midline and thyroid not enlarged, symmetric, no tenderness/mass/nodules Lungs: clear to auscultation bilaterally Heart: regular rate and rhythm, S1, S2 normal, no murmur, click, rub or gallop Abdomen: soft, non-tender; bowel sounds normal; no masses,  no organomegaly and obese Extremities: diffuse, macular/patchy pink rask over upper and lower extremities and trunk Pulses: 2+ and symmetric Skin: rash as described above, celluitis and 2+ edema of the right lower extremity Neurologic: Grossly normal  Lab Results: Results for orders placed during the hospital encounter of 05/12/12 (from the past 48 hour(s))  URINALYSIS, ROUTINE W REFLEX MICROSCOPIC     Status: Abnormal   Collection Time   05/15/12  9:52 AM      Component Value Range Comment   Color, Urine AMBER (*) YELLOW BIOCHEMICALS MAY BE AFFECTED BY COLOR   APPearance TURBID (*) CLEAR    Specific Gravity, Urine 1.021  1.005 - 1.030    pH 5.5  5.0 - 8.0     Glucose, UA NEGATIVE  NEGATIVE mg/dL    Hgb urine dipstick LARGE (*) NEGATIVE    Bilirubin Urine SMALL (*) NEGATIVE    Ketones, ur 15 (*) NEGATIVE mg/dL    Protein, ur 161 (*) NEGATIVE mg/dL    Urobilinogen, UA 0.2  0.0 - 1.0 mg/dL    Nitrite NEGATIVE  NEGATIVE    Leukocytes, UA LARGE (*) NEGATIVE   URINE MICROSCOPIC-ADD ON     Status: Abnormal   Collection Time   05/15/12  9:52 AM  Component Value Range Comment   Squamous Epithelial / LPF FEW (*) RARE    WBC, UA 21-50  <3 WBC/hpf    RBC / HPF TOO NUMEROUS TO COUNT  <3 RBC/hpf    Urine-Other AMORPHOUS URATES/PHOSPHATES     VANCOMYCIN, TROUGH     Status: Normal   Collection Time   05/15/12 10:15 AM      Component Value Range Comment   Vancomycin Tr 15.4  10.0 - 20.0 ug/mL   PROCALCITONIN     Status: Normal   Collection Time   05/15/12 10:15 AM      Component Value Range Comment   Procalcitonin 0.23     LACTIC ACID, PLASMA     Status: Abnormal   Collection Time   05/15/12 10:15 AM      Component Value Range Comment   Lactic Acid, Venous 3.2 (*) 0.5 - 2.2 mmol/L   CK     Status: Normal   Collection Time   05/15/12 10:15 AM      Component Value Range Comment   Total CK 45  7 - 177 U/L   CARDIAC PANEL(CRET KIN+CKTOT+MB+TROPI)     Status: Normal   Collection Time   05/15/12 11:25 AM      Component Value Range Comment   Total CK 41  7 - 177 U/L    CK, MB 0.9  0.3 - 4.0 ng/mL    Troponin I <0.30  <0.30 ng/mL    Relative Index RELATIVE INDEX IS INVALID  0.0 - 2.5   MRSA PCR SCREENING     Status: Normal   Collection Time   05/15/12 11:46 AM      Component Value Range Comment   MRSA by PCR NEGATIVE  NEGATIVE   POCT I-STAT 3, BLOOD GAS (G3+)     Status: Abnormal   Collection Time   05/15/12 11:52 AM      Component Value Range Comment   pH, Arterial 7.325 (*) 7.350 - 7.400    pCO2 arterial 32.1 (*) 35.0 - 45.0 mmHg    pO2, Arterial 90.0  80.0 - 100.0 mmHg    Bicarbonate 16.3 (*) 20.0 - 24.0 mEq/L    TCO2 17  0 - 100 mmol/L     O2 Saturation 95.0      Acid-base deficit 8.0 (*) 0.0 - 2.0 mmol/L    Patient temperature 103.5 F      Sample type ARTERIAL     LACTIC ACID, PLASMA     Status: Normal   Collection Time   05/15/12 12:05 PM      Component Value Range Comment   Lactic Acid, Venous 1.7  0.5 - 2.2 mmol/L   CULTURE, BLOOD (ROUTINE X 2)     Status: Normal (Preliminary result)   Collection Time   05/15/12 12:27 PM      Component Value Range Comment   Specimen Description BLOOD LEFT FOOT      Special Requests BOTTLES DRAWN AEROBIC ONLY 2CC      Culture  Setup Time [id number]      Culture        Value:        BLOOD CULTURE RECEIVED NO GROWTH TO DATE CULTURE WILL BE HELD FOR 5 DAYS BEFORE ISSUING A FINAL NEGATIVE REPORT   Report Status PENDING     CULTURE, BLOOD (ROUTINE X 2)     Status: Normal (Preliminary result)   Collection Time   05/15/12 12:35 PM      Component  Value Range Comment   Specimen Description BLOOD LEFT FOOT      Special Requests BOTTLES DRAWN AEROBIC ONLY 8CC      Culture  Setup Time [id number]      Culture        Value:        BLOOD CULTURE RECEIVED NO GROWTH TO DATE CULTURE WILL BE HELD FOR 5 DAYS BEFORE ISSUING A FINAL NEGATIVE REPORT   Report Status PENDING     SODIUM, URINE, RANDOM     Status: Normal   Collection Time   05/15/12  1:37 PM      Component Value Range Comment   Sodium, Ur 82     CREATININE, URINE, RANDOM     Status: Normal   Collection Time   05/15/12  1:37 PM      Component Value Range Comment   Creatinine, Urine 91.00     KETONES, URINE     Status: Abnormal   Collection Time   05/15/12  1:38 PM      Component Value Range Comment   Ketones, ur 15 (*) NEGATIVE mg/dL   LACTIC ACID, PLASMA     Status: Normal   Collection Time   05/15/12  3:00 PM      Component Value Range Comment   Lactic Acid, Venous 1.3  0.5 - 2.2 mmol/L   TSH     Status: Abnormal   Collection Time   05/15/12  3:00 PM      Component Value Range Comment   TSH 0.306 (*) 0.350 - 4.500 uIU/mL     BASIC METABOLIC PANEL     Status: Abnormal   Collection Time   05/15/12  3:00 PM      Component Value Range Comment   Sodium 129 (*) 135 - 145 mEq/L    Potassium 4.5  3.5 - 5.1 mEq/L    Chloride 99  96 - 112 mEq/L    CO2 17 (*) 19 - 32 mEq/L    Glucose, Bld 275 (*) 70 - 99 mg/dL    BUN 29 (*) 6 - 23 mg/dL    Creatinine, Ser 8.11 (*) 0.50 - 1.10 mg/dL    Calcium 7.6 (*) 8.4 - 10.5 mg/dL    GFR calc non Af Amer 33 (*) >90 mL/min    GFR calc Af Amer 38 (*) >90 mL/min   CARDIAC PANEL(CRET KIN+CKTOT+MB+TROPI)     Status: Normal   Collection Time   05/15/12  3:00 PM      Component Value Range Comment   Total CK 38  7 - 177 U/L    CK, MB 1.4  0.3 - 4.0 ng/mL    Troponin I <0.30  <0.30 ng/mL    Relative Index RELATIVE INDEX IS INVALID  0.0 - 2.5   CORTISOL     Status: Normal   Collection Time   05/15/12  3:00 PM      Component Value Range Comment   Cortisol, Plasma 21.9     GLUCOSE, CAPILLARY     Status: Abnormal   Collection Time   05/15/12  5:25 PM      Component Value Range Comment   Glucose-Capillary 293 (*) 70 - 99 mg/dL   CARBOXYHEMOGLOBIN     Status: Abnormal   Collection Time   05/15/12  5:37 PM      Component Value Range Comment   Total hemoglobin 12.0 (*) 12.5 - 16.0 g/dL    O2 Saturation 85.9  Carboxyhemoglobin 1.3  0.5 - 1.5 %    Methemoglobin 1.1  0.0 - 1.5 %   TYPE AND SCREEN     Status: Normal   Collection Time   05/15/12  6:25 PM      Component Value Range Comment   ABO/RH(D) O POS      Antibody Screen NEG      Sample Expiration 05/18/2012     ABO/RH     Status: Normal   Collection Time   05/15/12  6:25 PM      Component Value Range Comment   ABO/RH(D) O POS     CARDIAC PANEL(CRET KIN+CKTOT+MB+TROPI)     Status: Normal   Collection Time   05/15/12  6:30 PM      Component Value Range Comment   Total CK 44  7 - 177 U/L    CK, MB 1.4  0.3 - 4.0 ng/mL    Troponin I <0.30  <0.30 ng/mL    Relative Index RELATIVE INDEX IS INVALID  0.0 - 2.5   GLUCOSE,  CAPILLARY     Status: Abnormal   Collection Time   05/15/12  8:19 PM      Component Value Range Comment   Glucose-Capillary 302 (*) 70 - 99 mg/dL   BASIC METABOLIC PANEL     Status: Abnormal   Collection Time   05/15/12  8:35 PM      Component Value Range Comment   Sodium 125 (*) 135 - 145 mEq/L    Potassium 4.2  3.5 - 5.1 mEq/L    Chloride 95 (*) 96 - 112 mEq/L    CO2 18 (*) 19 - 32 mEq/L    Glucose, Bld 326 (*) 70 - 99 mg/dL    BUN 27 (*) 6 - 23 mg/dL    Creatinine, Ser 1.61 (*) 0.50 - 1.10 mg/dL    Calcium 7.4 (*) 8.4 - 10.5 mg/dL    GFR calc non Af Amer 38 (*) >90 mL/min    GFR calc Af Amer 44 (*) >90 mL/min   SODIUM, URINE, RANDOM     Status: Normal   Collection Time   05/15/12 10:14 PM      Component Value Range Comment   Sodium, Ur 57     CREATININE, URINE, RANDOM     Status: Normal   Collection Time   05/15/12 10:14 PM      Component Value Range Comment   Creatinine, Urine 104.93     HEPARIN LEVEL (UNFRACTIONATED)     Status: Abnormal   Collection Time   05/15/12 11:06 PM      Component Value Range Comment   Heparin Unfractionated 0.18 (*) 0.30 - 0.70 IU/mL   GLUCOSE, CAPILLARY     Status: Abnormal   Collection Time   05/15/12 11:47 PM      Component Value Range Comment   Glucose-Capillary 221 (*) 70 - 99 mg/dL   BASIC METABOLIC PANEL     Status: Abnormal   Collection Time   05/16/12  1:54 AM      Component Value Range Comment   Sodium 127 (*) 135 - 145 mEq/L    Potassium 3.4 (*) 3.5 - 5.1 mEq/L DELTA CHECK NOTED   Chloride 98  96 - 112 mEq/L    CO2 20  19 - 32 mEq/L    Glucose, Bld 196 (*) 70 - 99 mg/dL    BUN 25 (*) 6 - 23 mg/dL    Creatinine, Ser 0.96 (*) 0.50 -  1.10 mg/dL    Calcium 6.8 (*) 8.4 - 10.5 mg/dL    GFR calc non Af Amer 40 (*) >90 mL/min    GFR calc Af Amer 47 (*) >90 mL/min   CARDIAC PANEL(CRET KIN+CKTOT+MB+TROPI)     Status: Normal   Collection Time   05/16/12  3:53 AM      Component Value Range Comment   Total CK 44  7 - 177 U/L    CK, MB  1.5  0.3 - 4.0 ng/mL    Troponin I <0.30  <0.30 ng/mL    Relative Index RELATIVE INDEX IS INVALID  0.0 - 2.5   CBC     Status: Abnormal   Collection Time   05/16/12  3:53 AM      Component Value Range Comment   WBC 15.2 (*) 4.0 - 10.5 K/uL    RBC 4.17  3.87 - 5.11 MIL/uL    Hemoglobin 12.6  12.0 - 15.0 g/dL    HCT 62.1  30.8 - 65.7 %    MCV 89.7  78.0 - 100.0 fL    MCH 30.2  26.0 - 34.0 pg    MCHC 33.7  30.0 - 36.0 g/dL    RDW 84.6  96.2 - 95.2 %    Platelets 190  150 - 400 K/uL   PROCALCITONIN     Status: Normal   Collection Time   05/16/12  3:53 AM      Component Value Range Comment   Procalcitonin 0.28     LACTIC ACID, PLASMA     Status: Normal   Collection Time   05/16/12  3:54 AM      Component Value Range Comment   Lactic Acid, Venous 2.1  0.5 - 2.2 mmol/L   BASIC METABOLIC PANEL     Status: Abnormal   Collection Time   05/16/12  8:00 AM      Component Value Range Comment   Sodium 136  135 - 145 mEq/L    Potassium 3.1 (*) 3.5 - 5.1 mEq/L    Chloride 108  96 - 112 mEq/L    CO2 18 (*) 19 - 32 mEq/L    Glucose, Bld 178 (*) 70 - 99 mg/dL    BUN 20  6 - 23 mg/dL    Creatinine, Ser 8.41  0.50 - 1.10 mg/dL    Calcium 5.6 (*) 8.4 - 10.5 mg/dL    GFR calc non Af Amer 58 (*) >90 mL/min    GFR calc Af Amer 67 (*) >90 mL/min   HEPARIN LEVEL (UNFRACTIONATED)     Status: Normal   Collection Time   05/16/12  8:00 AM      Component Value Range Comment   Heparin Unfractionated 0.55  0.30 - 0.70 IU/mL   GLUCOSE, CAPILLARY     Status: Abnormal   Collection Time   05/16/12  8:27 AM      Component Value Range Comment   Glucose-Capillary 157 (*) 70 - 99 mg/dL   GLUCOSE, CAPILLARY     Status: Abnormal   Collection Time   05/16/12 12:33 PM      Component Value Range Comment   Glucose-Capillary 206 (*) 70 - 99 mg/dL   HEPARIN LEVEL (UNFRACTIONATED)     Status: Normal   Collection Time   05/16/12  1:00 PM      Component Value Range Comment   Heparin Unfractionated 0.56  0.30 - 0.70  IU/mL   BASIC METABOLIC PANEL  Status: Abnormal   Collection Time   05/16/12  2:00 PM      Component Value Range Comment   Sodium 128 (*) 135 - 145 mEq/L    Potassium 4.0  3.5 - 5.1 mEq/L    Chloride 95 (*) 96 - 112 mEq/L    CO2 21  19 - 32 mEq/L    Glucose, Bld 235 (*) 70 - 99 mg/dL    BUN 24 (*) 6 - 23 mg/dL    Creatinine, Ser 6.44 (*) 0.50 - 1.10 mg/dL    Calcium 7.2 (*) 8.4 - 10.5 mg/dL    GFR calc non Af Amer 41 (*) >90 mL/min    GFR calc Af Amer 47 (*) >90 mL/min   GLUCOSE, CAPILLARY     Status: Abnormal   Collection Time   05/16/12  4:52 PM      Component Value Range Comment   Glucose-Capillary 195 (*) 70 - 99 mg/dL   GLUCOSE, CAPILLARY     Status: Abnormal   Collection Time   05/16/12  8:20 PM      Component Value Range Comment   Glucose-Capillary 203 (*) 70 - 99 mg/dL   BASIC METABOLIC PANEL     Status: Abnormal   Collection Time   05/16/12  8:42 PM      Component Value Range Comment   Sodium 128 (*) 135 - 145 mEq/L    Potassium 4.4  3.5 - 5.1 mEq/L    Chloride 95 (*) 96 - 112 mEq/L    CO2 19  19 - 32 mEq/L    Glucose, Bld 181 (*) 70 - 99 mg/dL    BUN 24 (*) 6 - 23 mg/dL    Creatinine, Ser 0.34 (*) 0.50 - 1.10 mg/dL    Calcium 7.0 (*) 8.4 - 10.5 mg/dL    GFR calc non Af Amer 45 (*) >90 mL/min    GFR calc Af Amer 52 (*) >90 mL/min   DIFFERENTIAL     Status: Abnormal   Collection Time   05/16/12  8:42 PM      Component Value Range Comment   Neutrophils Relative 85 (*) 43 - 77 %    Neutro Abs 8.7 (*) 1.7 - 7.7 K/uL    Lymphocytes Relative 7 (*) 12 - 46 %    Lymphs Abs 0.7  0.7 - 4.0 K/uL    Monocytes Relative 3  3 - 12 %    Monocytes Absolute 0.3  0.1 - 1.0 K/uL    Eosinophils Relative 5  0 - 5 %    Eosinophils Absolute 0.5  0.0 - 0.7 K/uL    Basophils Relative 0  0 - 1 %    Basophils Absolute 0.0  0.0 - 0.1 K/uL   HEPATIC FUNCTION PANEL     Status: Abnormal   Collection Time   05/16/12  8:42 PM      Component Value Range Comment   Total Protein 4.2 (*) 6.0  - 8.3 g/dL    Albumin 1.4 (*) 3.5 - 5.2 g/dL    AST 17  0 - 37 U/L HEMOLYSIS AT THIS LEVEL MAY AFFECT RESULT   ALT 14  0 - 35 U/L    Alkaline Phosphatase 59  39 - 117 U/L    Total Bilirubin 0.2 (*) 0.3 - 1.2 mg/dL    Bilirubin, Direct <7.4  0.0 - 0.3 mg/dL    Indirect Bilirubin NOT CALCULATED  0.3 - 0.9 mg/dL   GLUCOSE, CAPILLARY  Status: Abnormal   Collection Time   05/16/12 11:41 PM      Component Value Range Comment   Glucose-Capillary 197 (*) 70 - 99 mg/dL   BASIC METABOLIC PANEL     Status: Abnormal   Collection Time   05/17/12  2:00 AM      Component Value Range Comment   Sodium 130 (*) 135 - 145 mEq/L    Potassium 4.0  3.5 - 5.1 mEq/L    Chloride 98  96 - 112 mEq/L    CO2 20  19 - 32 mEq/L    Glucose, Bld 184 (*) 70 - 99 mg/dL    BUN 23  6 - 23 mg/dL    Creatinine, Ser 1.61 (*) 0.50 - 1.10 mg/dL    Calcium 7.4 (*) 8.4 - 10.5 mg/dL    GFR calc non Af Amer 44 (*) >90 mL/min    GFR calc Af Amer 51 (*) >90 mL/min   GLUCOSE, CAPILLARY     Status: Abnormal   Collection Time   05/17/12  3:15 AM      Component Value Range Comment   Glucose-Capillary 178 (*) 70 - 99 mg/dL   HEPARIN LEVEL (UNFRACTIONATED)     Status: Normal   Collection Time   05/17/12  5:15 AM      Component Value Range Comment   Heparin Unfractionated 0.65  0.30 - 0.70 IU/mL   HEPATIC FUNCTION PANEL     Status: Abnormal   Collection Time   05/17/12  5:15 AM      Component Value Range Comment   Total Protein 4.0 (*) 6.0 - 8.3 g/dL    Albumin 1.4 (*) 3.5 - 5.2 g/dL    AST 12  0 - 37 U/L    ALT 11  0 - 35 U/L    Alkaline Phosphatase 56  39 - 117 U/L    Total Bilirubin 0.2 (*) 0.3 - 1.2 mg/dL    Bilirubin, Direct <0.9  0.0 - 0.3 mg/dL    Indirect Bilirubin NOT CALCULATED  0.3 - 0.9 mg/dL   BASIC METABOLIC PANEL     Status: Abnormal   Collection Time   05/17/12  5:15 AM      Component Value Range Comment   Sodium 133 (*) 135 - 145 mEq/L    Potassium 3.7  3.5 - 5.1 mEq/L    Chloride 101  96 - 112 mEq/L      CO2 21  19 - 32 mEq/L    Glucose, Bld 143 (*) 70 - 99 mg/dL    BUN 23  6 - 23 mg/dL    Creatinine, Ser 6.04 (*) 0.50 - 1.10 mg/dL    Calcium 7.4 (*) 8.4 - 10.5 mg/dL    GFR calc non Af Amer 46 (*) >90 mL/min    GFR calc Af Amer 53 (*) >90 mL/min   MAGNESIUM     Status: Abnormal   Collection Time   05/17/12  5:15 AM      Component Value Range Comment   Magnesium 1.3 (*) 1.5 - 2.5 mg/dL   PHOSPHORUS     Status: Normal   Collection Time   05/17/12  5:15 AM      Component Value Range Comment   Phosphorus 2.5  2.3 - 4.6 mg/dL   CBC     Status: Abnormal   Collection Time   05/17/12  5:19 AM      Component Value Range Comment   WBC 7.7  4.0 -  10.5 K/uL    RBC 3.72 (*) 3.87 - 5.11 MIL/uL    Hemoglobin 11.3 (*) 12.0 - 15.0 g/dL    HCT 10.2 (*) 72.5 - 46.0 %    MCV 89.2  78.0 - 100.0 fL    MCH 30.4  26.0 - 34.0 pg    MCHC 34.0  30.0 - 36.0 g/dL    RDW 36.6  44.0 - 34.7 %    Platelets 175  150 - 400 K/uL   DIFFERENTIAL     Status: Abnormal   Collection Time   05/17/12  5:19 AM      Component Value Range Comment   Neutrophils Relative 78 (*) 43 - 77 %    Neutro Abs 6.0  1.7 - 7.7 K/uL    Lymphocytes Relative 11 (*) 12 - 46 %    Lymphs Abs 0.8  0.7 - 4.0 K/uL    Monocytes Relative 4  3 - 12 %    Monocytes Absolute 0.3  0.1 - 1.0 K/uL    Eosinophils Relative 7 (*) 0 - 5 %    Eosinophils Absolute 0.5  0.0 - 0.7 K/uL    Basophils Relative 0  0 - 1 %    Basophils Absolute 0.0  0.0 - 0.1 K/uL   GLUCOSE, CAPILLARY     Status: Abnormal   Collection Time   05/17/12  7:34 AM      Component Value Range Comment   Glucose-Capillary 125 (*) 70 - 99 mg/dL     Imaging: Ct Tibia Fibula Right Wo Contrast  05/15/2012  *RADIOLOGY REPORT*  Clinical Data: Worsening cellulitis.  Evaluate for abscess.  CT OF THE RIGHT TIBIA FIBULA WITHOUT CONTRAST  Comparison: 05/12/2012.  Findings: Diffuse subcutaneous edema is present in the leg extending through the ankle and hind foot.  No focal fluid collections  are identified suspicious for abscess.  The anterior and posterior muscular compartments are within normal limits. Atherosclerosis of the below-the-knee small vessels.  Phleboliths are present in the soft tissues.  There is no fracture.  Moderate midfoot osteoarthritis is present.  Large subchondral geode is present in the posterior calcaneus adjacent to the subtalar joint. This appears degenerative.  Achilles tendon appears intact. Posteromedial, posterolateral and anterior tendon groups appear within normal limits.  Osteoarthritis of the knee is incidentally noted.  Significantly, there is no gas in the soft tissues to suggest necrotizing fasciitis for infection with a gas-forming organism. No osteomyelitis.  IMPRESSION: Diffuse infiltration of the subcutaneous tissues compatible with cellulitis.  No abscess or gas in the soft tissues.  Osteoarthritis of the knee and foot.  Original Report Authenticated By: Andreas Newport, M.D.   US Renal Port  05/16/2012  *RADIOLOGY REPORT*  Clinical Data: 78 year old female with renal failure.  History of diabetes and hypertension.  RENAL/URINARY TRACT ULTRASOUND COMPLETE  Comparison:  09/04/2009 renal ultrasound from [facility].  Findings:  Right Kidney:  The right kidney is normal in echogenicity measuring 9.6 cm.  Moderate cortical atrophy is present.  There is no evidence of hydronephrosis, solid renal mass or definite renal calculi.  Left Kidney:  The left kidney is normal in echogenicity measuring 11 cm.  Moderate cortical atrophy is present.  A 2.7 x 3.3 cm cyst within the lower left kidney is noted.  There is no evidence of hydronephrosis, solid mass or definite renal calculi.  Bladder:  A Foley catheter within the collapsed bladder is noted.  IMPRESSION: No evidence of acute abnormality.  Moderate bilateral  renal cortical atrophy.  Left lower pole renal cyst again noted.  Foley catheter within the bladder.  Original Report Authenticated By: Rosendo Gros, M.D.   Dg Chest Port 1 View  05/17/2012  *RADIOLOGY REPORT*  Clinical Data: Left lower lobe infiltrate, shortness of breath.  PORTABLE CHEST - 1 VIEW  Comparison: 05/16/2012  Findings: Increasing bibasilar airspace opacities, left greater than right, atelectasis or infiltrates.  Possible small left pleural effusion.  Heart is normal size.  Right central line is unchanged.  IMPRESSION: Increasing bibasilar atelectasis or infiltrates, left greater than right.  Suspect small left effusion.  Original Report Authenticated By: Cyndie Chime, M.D.   Dg Chest Port 1 View  05/16/2012  *RADIOLOGY REPORT*  Clinical Data: Shortness of breath.  Question pneumonia.  PORTABLE CHEST - 1 VIEW  Comparison: Chest 05/15/2012.  Findings: Right IJ catheter remains in place.  Lungs appear clear. No pneumothorax or pleural fluid.  Right costophrenic angle is just off the margin of film.  Heart size normal.  IMPRESSION: No acute finding.  Original Report Authenticated By: Bernadene Bell. Maricela Curet, M.D.   Dg Chest Port 1 View  05/15/2012  *RADIOLOGY REPORT*  Clinical Data: Right IJ central line placement  PORTABLE CHEST - 1 VIEW  Comparison: None.  Findings: Normal cardiac silhouette and mediastinal contours. Minimal atherosclerotic calcification within the aortic arch. Interval placement of a right jugular approach central venous catheter with tip overlying the distal SVC.  No pneumothorax. No focal airspace opacities.  No definite pneumothorax or pleural effusion.  Unchanged bones.  IMPRESSION: 1.  Right internal jugular approach central venous catheter tip overlies the distal SVC.  No pneumothorax. 2. No acute cardiopulmonary disease.  Original Report Authenticated By: Waynard Reeds, M.D.    Assessment:  1. Principal Problem: 2.  *Cellulitis in diabetic foot 3. Active Problems: 4.  DM (diabetes mellitus) 5.  HTN (hypertension) 6.  Hypercholesterolemia 7.  Afib, paroxysmal, with RVR at times with BBB 8.   Anemia 9.  Diastolic dysfunction, left ventricle 10.  Acute kidney failure 11.  Allergic drug rash 12.  Septic shock 13.   Plan:  1. She has converted to sinus rhythm with LBBB overnight on amiodarone infusion. EKG shows a narrower QRS now, suggesting as suspected a rate-related LBBB. There are Q waves in V1-V3, which is suggestive of septal infarct vs. Lead placement. Cardiac enzymes are negative. Echo does not show obvious wall motion abnormalities. Could consider outpatient ischemia w/u in the future when she recovers. For now, I would continue amiodarone and plan to switch to PO amiodarone 400 mg BID tomorrow.  It would be reasonable to consider long-term anticoagulation with Pradaxa or Eliquis on discharge.  Also, recommend adding low-dose B-blocker when successfully weaned off of phenylephrine.  Time Spent Directly with Patient:  30 minutes  Length of Stay:  LOS: 5 days   Chrystie Nose, MD, [facility] Attending Cardiologist [facility]  Railey Glad C 05/17/2012, 7:52 AM

## 2012-05-17 NOTE — Progress Notes (Signed)
Inpatient Diabetes Program Recommendations  AACE/ADA: New Consensus Statement on Inpatient Glycemic Control (2009)  Target Ranges:  Prepandial:   less than 140 mg/dL      Peak postprandial:   less than 180 mg/dL (1-2 hours)      Critically ill patients:  140 - 180 mg/dL   Reason for Visit: Meal coverage is working when given.  Inpatient Diabetes Program Recommendations Insulin - Meal Coverage: Pt's cbg's much better controlled when meal coverage is given.  Note: Thank you, Lenor Coffin, RN, CNS, Diabetes Coordinator [phone])

## 2012-05-17 NOTE — Progress Notes (Signed)
VASCULAR LAB PRELIMINARY  ARTERIAL  ABI completed:    RIGHT    LEFT    PRESSURE WAVEFORM  PRESSURE WAVEFORM  BRACHIAL 138 Triphasic BRACHIAL 134 Triphasic  DP 136 Triphasic DP 123 Triphasic         PT 143 Triphasic PT 147 Triphasic                  RIGHT LEFT  ABI 1.04 1.07   ABIs and Doppler waveforms are within normal limits bilaterally at rest  Jumanah Hynson D, RVS 05/17/2012, 2:12 PM

## 2012-05-17 NOTE — Progress Notes (Signed)
Name: Pamela [name] MRN: [id number] DOB: 16-Oct-1934    LOS: 5  Referring Provider:  Lavera Guise Reason for Referral:  Acidosis, mental status change  PULMONARY / CRITICAL CARE MEDICINE  Brief patient description:   78 y.o admitted 6/13 w/ RLE cellulitis, sepsis, and SVT. Initially it appeared as though she was improving clinically, with the exception of slight bump in her creatinine. On 6/16 moved to ICU as found more somnolent, had developed progressive renal failure, diffuse rash and urticaria, as well as metabolic acidosis, hyperkalemia and AF w/ RVR. Her RLE appeared to have become more erythremic and swelling had progressed, also she had developed a diffuse rash. PCCM was asked to eval given concern for sepsis and progressive metabolic acidosis and also concern for possible adverse drug reaction to her antibiotic therapy.   Events Since Admission: Moved to ICU, placed on amiodarone gtt  Current Status: critical Vital Signs: Temp:  [98.5 F (36.9 C)-101.5 F (38.6 C)] 98.6 F (37 C) (06/18 0400) Pulse Rate:  [82-152] 86  (06/18 0845) Resp:  [16-28] 20  (06/18 0845) BP: (82-132)/(38-95) 130/50 mmHg (06/18 0845) SpO2:  [94 %-100 %] 99 % (06/18 0845)  Physical Examination: General:  Acutely ill appearing female.  Neuro:  Very easily arousable and interactive, follows commands. HEENT:  MM are dry, No JVD Cardiovascular:  Tachy irreg irreg  Lungs:  Clear, decreased Abdomen:  Soft, non-tender, No OM, hypoactive Musculoskeletal:  RLE is swollen/edematous, erythremic, warm to touch. It appears that the erythema has worsened.  Skin:  RLE is swollen, warm to touch, and erythremic. She has developed a diffuse rash over her entire body with worsening left forearm erythema.  Intake/Output Summary (Last 24 hours) at 05/17/12 0914 Last data filed at 05/17/12 0800  Gross per 24 hour  Intake 5439.81 ml  Output   1690 ml  Net 3749.81 ml   Principal Problem:  *Cellulitis in diabetic  foot Active Problems:  DM (diabetes mellitus)  HTN (hypertension)  Hypercholesterolemia  Afib, paroxysmal, with RVR at times with BBB  Anemia  Diastolic dysfunction, left ventricle  Acute kidney failure  Allergic drug rash  Septic shock  ASSESSMENT AND PLAN  PULMONARY  Lab 05/15/12 1737 05/15/12 1152  PHART -- 7.325*  PCO2ART -- 32.1*  PO2ART -- 90.0  HCO3 -- 16.3*  O2SAT 85.9 95.0   Ventilator Settings:   CXR:  No infiltrates. CVL in good position ETT:    A:  No acute pulmonary pathology. Currently she is compensating for her metabolic acidosis. She is at risk for decline should her MS worsen and if fluid overload results. P:   -Pulse ox remains an issue, needing more O2 today compared to yesterday. -Titrate O2 as tolerated.  -F/U am CXR for ? Infiltrate and fluid overload. -ABG reviewed, no need for repeat at this time.  CARDIOVASCULAR  Lab 05/16/12 0354 05/16/12 0353 05/15/12 1830 05/15/12 1500 05/15/12 1205 05/15/12 1125 05/15/12 1015  TROPONINI -- <0.30 <0.30 <0.30 -- <0.30 --  LATICACIDVEN 2.1 -- -- 1.3 1.7 -- 3.2*  PROBNP -- -- -- -- -- -- --   ECG:  Af w/ RVR, wide complex QRS Lines: Right IJ CVL 6/16>>>  ECHO 6/16: Left ventricle: The cavity size was normal. Wall thickness was increased in a pattern of moderate to severe LVH. Theestimated ejection fraction was 65%. Wall motion was normal; there were no regional wall motion abnormalities. Doppler parameters are consistent with abnormal left ventricular relaxation (grade 1 diastolic dysfunction). - Right ventricle: The  cavity size was normal. Systolic function was normal.  A: AF w/ RVR, has wide QRS. Cards following. Suspect that this is a response to her acute illness, as she has been in and out of AF. No doubt also being driven by fever.  P:  -Amio drip per cards, in and out of a-fib and BP seems better when in sinus rhythem. -Central access to assess CVP: gaol 8-12 -Continue neo and titrate for MAP of  65 mmHg. -Keep off pradaxa for now given renal injury and continue heparin drip.  A:SIRS/Sepsis. Currently has good SBP, w/ lactate boarder line at 2.1.  P: -KVO IVF. -Repeat lactate improving, will not start sepsis protocol to avoid fluid overload for now. -Stopped CCB. -Cortisol level 21.9.  No need for stress dose steroids. -CT with cellulitis but no free gas or nec fasc.  RENAL  Lab 05/17/12 0740 05/17/12 0515 05/17/12 0200 05/16/12 2042 05/16/12 1400  NA 134* 133* 130* 128* 128*  K 4.3 3.7 -- -- --  CL 103 101 98 95* 95*  CO2 21 21 20 19 21   BUN 22 23 23  24* 24*  CREATININE 1.14* 1.14* 1.18* 1.16* 1.25*  CALCIUM 7.2* 7.4* 7.4* 7.0* 7.2*  MG -- 1.3* -- -- --  PHOS -- 2.5 -- -- --   Intake/Output      06/17 0701 - 06/18 0700 06/18 0701 - 06/19 0700   P.O. 420    I.V. (mL/kg) 2472.2 (27.9) 107 (1.2)   IV Piggyback 3000    Total Intake(mL/kg) 5892.2 (66.5) 107 (1.2)   Urine (mL/kg/hr) 1790 (0.8)    Total Output 1790    Net +4102.2 +107         Foley:    A: Acute renal Failure: unclear if this is ATN in setting of sepsis and Ace-I, due to possible nephritis s/p strep, or possibly drug induced after abx ?r/o IN,  nephro consulted.  Appreciate input.  Anticipate as the patient continues to be in sinus rhythem and BP is more stable renal function will essentially normalize.  P: -Urine sediment not indicating any renal pathology, likely pre-renal. -Renal US noted, no hydro. -Avoid nephro-toxins. -D/Ced vanc/zosyn and abx as below. -Renal dose meds. -F/U chemistry in AM. -KVO IVF.  A:Metabolic Acidosis Both non-anion gap/and anion gap: prob due to RVR and her AKI.  P: -D/C bicarb drip. -Close obs of chemistry.  A:hypokalemia P: -No need for replacement. -F/U BMET.  GASTROINTESTINAL  Lab 05/17/12 0515 05/16/12 2042  AST 12 17  ALT 11 14  ALKPHOS 56 59  BILITOT 0.2* 0.2*  PROT 4.0* 4.2*  ALBUMIN 1.4* 1.4*    A:  No Acute issues P:   -SUP  ordered. -Diabetic diet.  HEMATOLOGIC  Lab 05/17/12 0519 05/16/12 0353 05/15/12 0620 05/14/12 0632 05/13/12 0555  HGB 11.3* 12.6 12.0 11.9* 10.2*  HCT 33.2* 37.4 35.8* 37.0 31.4*  PLT 175 190 214 181 167  INR -- -- -- -- 1.18  APTT -- -- -- -- --   A: Has moderate Anemia at baseline. Hgb of 12 may reflect hemoconcentration, as she has third spaced significantly and may still be vascularly dry. P:  -Trend CBC -Transfuse for hgb < 7, unless she develops shock  INFECTIOUS  Lab 05/17/12 0519 05/16/12 0353 05/15/12 1015 05/15/12 0620 05/14/12 0632 05/13/12 0555  WBC 7.7 15.2* -- 11.6* 8.4 5.7  PROCALCITON -- 0.28 0.23 -- -- --   Cultures: 6/13 BCX2 6/13>>>NTD BCX2 6/16>>>NTD  Antibiotics: vanc 6/13>>>6/16 (rash)  Zosyn 6/13>>>6/16 (rash) clinda (cellulitis) 6/16>>>6/17 cipro (cellulitis) 6/16>>>6/17 zyvox (cellulitis) 6/16>>> Aztreonam (cellulitis) 6/17>>> A:   LE Cellulitis w/ resulting severe sepsis. Her lactate is 3.2 but currently systolic SBP holding. Need further eval of LE to r/o progression of infection.Elita Boone about strep group, r/o nec fasc P:   -See abx noted above -Cultures sent and all NTD. -PCT 0.28. -CT LE negative for gas formation, only cellulitis. -Clinda was being used for toxin inhibition but evidently linezolid does the same thing, stopped cipro.  ENDOCRINE  Lab 05/17/12 0734 05/17/12 0315 05/16/12 2341 05/16/12 2020 05/16/12 1652  GLUCAP 125* 178* 197* 203* 195*   A: DM/ Hyperglycemia P:   -ssi  NEUROLOGIC  A:  Acute Encephalopathy: prob due to sepsis, fever and renal failure.  Improving rapidly. P:   -supportive care  Derm: Diffuse erythemic rash: ? Drugs ? Sepsis.  P: -vanc and zosyn stopped, although I am more impressed that this is sepsis related . Rash may be toxic shock etc -monitor, may need to record allergy, avoid for now -Dermatology saw patient (appreciate input) recommend topical steroids and continuation of systemic steroids  but no evidence of SJS/TEN.  BEST PRACTICE / DISPOSITION Level of Care:  icu Primary Service:  pccm Consultants:  Cards and nephro Code Status:  full Diet:  npo DVT Px:  Heparin gtt GI Px:  ppi Skin Integrity:  Diffuse rash Social / Family:  Updated by med svc  Alyson Reedy, M.D. [facility] Pulmonary/Critical Care Medicine. Pager: [phone]. After hours pager: [phone].

## 2012-05-17 NOTE — Progress Notes (Signed)
ANTICOAGULATION CONSULT NOTE - Follow Up Consult  Pharmacy Consult for heaprin Indication: atrial fibrillation  Allergies  Allergen Reactions  . Sulfa Antibiotics     unknown    Patient Measurements: Height: 5\' 5"  (165.1 cm) Weight: 195 lb 5.2 oz (88.6 kg) IBW/kg (Calculated) : 57  Heparin Dosing Weight: 75kg  Vital Signs: Temp: 98.6 F (37 C) (06/18 0400) Temp src: Oral (06/18 0400) BP: 136/53 mmHg (06/18 0900) Pulse Rate: 89  (06/18 0900)  Labs:  Basename 05/17/12 0740 05/17/12 0519 05/17/12 0515 05/17/12 0200 05/16/12 1300 05/16/12 0800 05/16/12 0353 05/15/12 1830 05/15/12 1500 05/15/12 0620  HGB -- 11.3* -- -- -- -- 12.6 -- -- --  HCT -- 33.2* -- -- -- -- 37.4 -- -- 35.8*  PLT -- 175 -- -- -- -- 190 -- -- 214  APTT -- -- -- -- -- -- -- -- -- --  LABPROT -- -- -- -- -- -- -- -- -- --  INR -- -- -- -- -- -- -- -- -- --  HEPARINUNFRC -- -- 0.65 -- 0.56 0.55 -- -- -- --  CREATININE 1.14* -- 1.14* 1.18* -- -- -- -- -- --  CKTOTAL -- -- -- -- -- -- 44 44 38 --  CKMB -- -- -- -- -- -- 1.5 1.4 1.4 --  TROPONINI -- -- -- -- -- -- <0.30 <0.30 <0.30 --    Estimated Creatinine Clearance: 46.1 ml/min (by C-G formula based on Cr of 1.14).   Medications:  Scheduled:     . amitriptyline  75 mg Oral Once  . atorvastatin  40 mg Oral q1800  . aztreonam  1 g Intravenous Q8H  . calcium carbonate  1,000 mg of elemental calcium Oral TID  . insulin aspart  0-20 Units Subcutaneous Q4H  . insulin aspart  4 Units Subcutaneous TID WC  . insulin glargine  8 Units Subcutaneous QHS  . linezolid  600 mg Intravenous Q12H  . magnesium sulfate 1 - 4 g bolus IVPB  2 g Intravenous Once  . potassium chloride  40 mEq Oral Once  . sodium chloride  1,000 mL Intravenous Once  . sodium chloride  1,000 mL Intravenous Once  . DISCONTD: ciprofloxacin  400 mg Intravenous Q24H  . DISCONTD: ciprofloxacin  400 mg Intravenous Q12H  . DISCONTD: clindamycin (CLEOCIN) IV  900 mg Intravenous Q8H     Assessment: 78 yo female with aflutter on heparin and at goal. Hg=11.3, plt=175 (stable). Noted plans po amiodarone and Pradaxa or Eliquis at discharge.   Goal of Therapy:  Heparin level 0.3-0.7 units/ml Monitor platelets by anticoagulation protocol: Yes   Plan:  -No heparin changes needed -Will follow oral AC plans  Harland German, Pharm D 05/17/2012 9:26 AM

## 2012-05-18 ENCOUNTER — Inpatient Hospital Stay (HOSPITAL_COMMUNITY): Payer: Medicare Other

## 2012-05-18 LAB — BASIC METABOLIC PANEL
CO2: 22 mEq/L (ref 19–32)
Chloride: 104 mEq/L (ref 96–112)
GFR calc Af Amer: 60 mL/min — ABNORMAL LOW (ref >90)
Potassium: 4.5 mEq/L (ref 3.5–5.1)
Sodium: 133 mEq/L — ABNORMAL LOW (ref 135–145)

## 2012-05-18 LAB — GLUCOSE, CAPILLARY
Glucose-Capillary: 169 mg/dL — ABNORMAL HIGH (ref 70–99)
Glucose-Capillary: 140 mg/dL — ABNORMAL HIGH (ref 70–99)

## 2012-05-18 LAB — CBC
Hemoglobin: 9.5 g/dL — ABNORMAL LOW (ref 12.0–15.0)
Platelets: 169 10*3/uL (ref 150–400)
RBC: 3.25 MIL/uL — ABNORMAL LOW (ref 3.87–5.11)
WBC: 4.8 10*3/uL (ref 4.0–10.5)

## 2012-05-18 LAB — HEPARIN LEVEL (UNFRACTIONATED): Heparin Unfractionated: 0.51 IU/mL (ref 0.30–0.70)

## 2012-05-18 LAB — PHOSPHORUS: Phosphorus: 2.4 mg/dL (ref 2.3–4.6)

## 2012-05-18 LAB — MAGNESIUM: Magnesium: 1.6 mg/dL (ref 1.5–2.5)

## 2012-05-18 MED ORDER — MAGNESIUM SULFATE 40 MG/ML IJ SOLN
2.0000 g | Freq: Once | INTRAMUSCULAR | Status: AC
  Administered 2012-05-18: 2 g via INTRAVENOUS
  Filled 2012-05-18: qty 50

## 2012-05-18 MED ORDER — PANTOPRAZOLE SODIUM 40 MG PO TBEC
40.0000 mg | DELAYED_RELEASE_TABLET | Freq: Every day | ORAL | Status: DC
  Administered 2012-05-18 – 2012-05-22 (×5): 40 mg via ORAL
  Filled 2012-05-18 (×5): qty 1

## 2012-05-18 MED ORDER — METOPROLOL TARTRATE 12.5 MG HALF TABLET
12.5000 mg | ORAL_TABLET | Freq: Two times a day (BID) | ORAL | Status: DC
  Administered 2012-05-18 (×2): 12.5 mg via ORAL
  Filled 2012-05-18 (×4): qty 1

## 2012-05-18 MED ORDER — AMIODARONE HCL 200 MG PO TABS: 200.0000 mg | ORAL_TABLET | Freq: Every day | ORAL | Status: DC

## 2012-05-18 MED ORDER — POLYETHYLENE GLYCOL 3350 17 G PO PACK
17.0000 g | PACK | Freq: Every day | ORAL | Status: DC
  Administered 2012-05-18 – 2012-05-20 (×3): 17 g via ORAL
  Filled 2012-05-18 (×4): qty 1

## 2012-05-18 MED ORDER — POTASSIUM & SODIUM PHOSPHATES 280-160-250 MG PO PACK
2.0000 | PACK | Freq: Three times a day (TID) | ORAL | Status: AC
  Administered 2012-05-18 (×3): 2 via ORAL
  Filled 2012-05-18 (×3): qty 2

## 2012-05-18 MED ORDER — FUROSEMIDE 10 MG/ML IJ SOLN
40.0000 mg | Freq: Three times a day (TID) | INTRAMUSCULAR | Status: AC
  Administered 2012-05-18 (×2): 40 mg via INTRAVENOUS
  Filled 2012-05-18 (×2): qty 4

## 2012-05-18 MED ORDER — AMIODARONE HCL 200 MG PO TABS
400.0000 mg | ORAL_TABLET | Freq: Two times a day (BID) | ORAL | Status: DC
  Administered 2012-05-18 – 2012-05-23 (×11): 400 mg via ORAL
  Filled 2012-05-18 (×12): qty 2

## 2012-05-18 MED ORDER — AMIODARONE HCL 200 MG PO TABS: 400.0000 mg | ORAL_TABLET | Freq: Every day | ORAL | Status: DC

## 2012-05-18 MED ORDER — DIPHENHYDRAMINE HCL 50 MG/ML IJ SOLN
12.5000 mg | Freq: Four times a day (QID) | INTRAMUSCULAR | Status: DC | PRN
  Administered 2012-05-18 – 2012-05-21 (×2): 12.5 mg via INTRAVENOUS
  Filled 2012-05-18: qty 1

## 2012-05-18 NOTE — Progress Notes (Signed)
ANTICOAGULATION CONSULT NOTE - Follow Up Consult  Pharmacy Consult:  Heparin Indication: atrial fibrillation  Allergies  Allergen Reactions  . Sulfa Antibiotics     unknown    Patient Measurements: Height: 5\' 5"  (165.1 cm) Weight: 195 lb 5.2 oz (88.6 kg) IBW/kg (Calculated) : 57  Heparin Dosing Weight: 75kg  Vital Signs: Temp: 98.9 F (37.2 C) (06/19 0800) Temp src: Oral (06/19 0800) BP: 145/37 mmHg (06/19 0800) Pulse Rate: 85  (06/19 0605)  Labs:  Basename 05/18/12 0300 05/17/12 2030 05/17/12 1345 05/17/12 0519 05/17/12 0515 05/16/12 1300 05/16/12 0353 05/15/12 1830 05/15/12 1500  HGB 9.5* -- -- 11.3* -- -- -- -- --  HCT 28.9* -- -- 33.2* -- -- 37.4 -- --  PLT 169 -- -- 175 -- -- 190 -- --  APTT -- -- -- -- -- -- -- -- --  LABPROT -- -- -- -- -- -- -- -- --  INR -- -- -- -- -- -- -- -- --  HEPARINUNFRC 0.51 -- -- -- 0.65 0.56 -- -- --  CREATININE 1.03 1.12* 1.19* -- -- -- -- -- --  CKTOTAL -- -- -- -- -- -- 44 44 38  CKMB -- -- -- -- -- -- 1.5 1.4 1.4  TROPONINI -- -- -- -- -- -- <0.30 <0.30 <0.30    Estimated Creatinine Clearance: 51.1 ml/min (by C-G formula based on Cr of 1.03).     Assessment: 78 yo female with Aflutter on heparin gtt with therapeutic heparin level.  No bleeding reported.  Noted plan for long term anticoagulation with Pradaxa or Eliquis at discharge.    Goal of Therapy:  Heparin level 0.3-0.7 units/ml Monitor platelets by anticoagulation protocol: Yes    Plan:  - Continue heparin gtt at 1300 units/hr - Daily HL / CBC     Joshia Kitchings D. Laney Potash, PharmD, BCPS Pager:  [phone] 05/18/2012, 9:03 AM

## 2012-05-18 NOTE — Progress Notes (Signed)
Name: CIDNEY KIRKWOOD MRN: [id number] DOB: [birth year]    LOS: 6  Referring Provider:  Lavera Guise Reason for Referral:  Acidosis, mental status change  PULMONARY / CRITICAL CARE MEDICINE  Brief patient description:   78 y.o admitted 6/13 w/ RLE cellulitis, sepsis, and SVT. Initially it appeared as though she was improving clinically, with the exception of slight bump in her creatinine. On 6/16 moved to ICU as found more somnolent, had developed progressive renal failure, diffuse rash and urticaria, as well as metabolic acidosis, hyperkalemia and AF w/ RVR. Her RLE appeared to have become more erythremic and swelling had progressed, also she had developed a diffuse rash. PCCM was asked to eval given concern for sepsis and progressive metabolic acidosis and also concern for possible adverse drug reaction to her antibiotic therapy.   Events Since Admission: Moved to ICU, placed on amiodarone gtt  Current Status: critical Vital Signs: Temp:  [97.9 F (36.6 C)-98.9 F (37.2 C)] 98.9 F (37.2 C) (06/19 0800) Pulse Rate:  [82-94] 85  (06/19 0605) Resp:  [16-22] 18  (06/19 0605) BP: (109-146)/(37-59) 145/37 mmHg (06/19 0800) SpO2:  [96 %-100 %] 97 % (06/19 1191)  Physical Examination: General:  Acutely ill appearing female.  Neuro:  Very easily arousable and interactive, follows commands. HEENT:  MM are dry, No JVD Cardiovascular:  Tachy irreg irreg  Lungs:  Clear, decreased Abdomen:  Soft, non-tender, No OM, hypoactive Musculoskeletal:  RLE is swollen/edematous, erythremic, warm to touch. It appears that the erythema has worsened.  Skin:  RLE is swollen, warm to touch, and erythremic. She has developed a diffuse rash over her entire body with worsening left forearm erythema.  Intake/Output Summary (Last 24 hours) at 05/18/12 0908 Last data filed at 05/18/12 0800  Gross per 24 hour  Intake 2664.47 ml  Output   2925 ml  Net -260.53 ml   Principal Problem:  *Cellulitis in diabetic  foot Active Problems:  DM (diabetes mellitus)  HTN (hypertension)  Hypercholesterolemia  Afib, paroxysmal, with RVR at times with BBB  Anemia  Diastolic dysfunction, left ventricle  Acute kidney failure  Allergic drug rash  Stevens-Johnson syndrome  Septic shock  ASSESSMENT AND PLAN  PULMONARY  Lab 05/15/12 1737 05/15/12 1152  PHART -- 7.325*  PCO2ART -- 32.1*  PO2ART -- 90.0  HCO3 -- 16.3*  O2SAT 85.9 95.0   Ventilator Settings:   CXR:  No infiltrates. CVL in good position ETT:    A:  No acute pulmonary pathology. Currently she is compensating for her metabolic acidosis. Done well overnight, minimal O2 demand. P:   -Pulse ox remains an issue, needing more O2 today compared to yesterday. -Titrate O2 as tolerated. For sat of 88-92%.  -ABG reviewed, no need for repeat at this time.  CARDIOVASCULAR  Lab 05/16/12 0354 05/16/12 0353 05/15/12 1830 05/15/12 1500 05/15/12 1205 05/15/12 1125 05/15/12 1015  TROPONINI -- <0.30 <0.30 <0.30 -- <0.30 --  LATICACIDVEN 2.1 -- -- 1.3 1.7 -- 3.2*  PROBNP -- -- -- -- -- -- --   ECG:  Af w/ RVR, wide complex QRS Lines: Right IJ CVL 6/16>>>  ECHO 6/16: Left ventricle: The cavity size was normal. Wall thickness was increased in a pattern of moderate to severe LVH. Theestimated ejection fraction was 65%. Wall motion was normal; there were no regional wall motion abnormalities. Doppler parameters are consistent with abnormal left ventricular relaxation (grade 1 diastolic dysfunction). - Right ventricle: The cavity size was normal. Systolic function was normal.  A: AF  w/ RVR, has wide QRS. Cards following. Suspect that this is a response to her acute illness, as she has been in and out of AF. No doubt also being driven by fever.  P:  -Amio drip changed to PO. -Central access, will keep in due to difficulty of IV access. -D/C neo. -Keep off pradaxa for now given renal injury and continue heparin drip while actively  diuresing.  A:SIRS/Sepsis. Currently has good SBP, w/ lactate boarder line at 2.1.  P: -KVO IVF. -Diureses today. -Stopped CCB. -Cortisol level 21.9.  No need for stress dose steroids. -CT with cellulitis but no free gas or nec fasc.  RENAL  Lab 05/18/12 0300 05/17/12 2030 05/17/12 1345 05/17/12 0740 05/17/12 0515  NA 133* 131* 131* 134* 133*  K 4.5 4.7 -- -- --  CL 104 101 100 103 101  CO2 22 21 20 21 21   BUN 19 20 21 22 23   CREATININE 1.03 1.12* 1.19* 1.14* 1.14*  CALCIUM 8.1* 7.6* 7.7* 7.2* 7.4*  MG 1.6 -- -- -- 1.3*  PHOS 2.4 -- -- -- 2.5   Intake/Output      06/18 0701 - 06/19 0700 06/19 0701 - 06/20 0700   P.O. 1080 240   I.V. (mL/kg) 1041.7 (11.8) 49.7 (0.6)   IV Piggyback 450    Total Intake(mL/kg) 2571.7 (29) 289.7 (3.3)   Urine (mL/kg/hr) 2725 (1.3) 450   Total Output 2725 450   Net -153.4 -160.3         Foley:    A: Acute renal Failure: unclear if this is ATN in setting of sepsis and Ace-I, due to possible nephritis s/p strep, or possibly drug induced after abx ?r/o IN,  nephro consulted.  Appreciate input.  Anticipate as the patient continues to be in sinus rhythem and BP is more stable renal function will essentially normalize.  P: -Urine sediment not indicating any renal pathology, likely pre-renal. -Renal US noted, no hydro. -Avoid nephro-toxins. -Abx as below. -Renal dose meds. -F/U chemistry in AM. -KVO IVF. -Two doses of lasix today.  A:Metabolic Acidosis Both non-anion gap/and anion gap: prob due to RVR and her AKI.  P: -D/Ced bicarb drip. -Close obs of chemistry.  A:hypokalemia P: -No need for replacement. -F/U BMET. -Replace K, Mg and Phos with diureses.  GASTROINTESTINAL  Lab 05/17/12 0515 05/16/12 2042  AST 12 17  ALT 11 14  ALKPHOS 56 59  BILITOT 0.2* 0.2*  PROT 4.0* 4.2*  ALBUMIN 1.4* 1.4*    A:  No Acute issues P:   -SUP ordered, change to PO. -Diabetic diet.  HEMATOLOGIC  Lab 05/18/12 0300 05/17/12 0519 05/16/12  0353 05/15/12 0620 05/14/12 0632 05/13/12 0555  HGB 9.5* 11.3* 12.6 12.0 11.9* --  HCT 28.9* 33.2* 37.4 35.8* 37.0 --  PLT 169 175 190 214 181 --  INR -- -- -- -- -- 1.18  APTT -- -- -- -- -- --   A: Has moderate Anemia at baseline. Hgb of 12 may reflect hemoconcentration, as she has third spaced significantly and may still be vascularly dry. P:  -Trend CBC -Transfuse for hgb < 7, unless she develops shock  INFECTIOUS  Lab 05/18/12 0300 05/17/12 0519 05/16/12 0353 05/15/12 1015 05/15/12 0620 05/14/12 0632  WBC 4.8 7.7 15.2* -- 11.6* 8.4  PROCALCITON -- -- 0.28 0.23 -- --   Cultures: 6/13 BCX2 6/13>>>NTD BCX2 6/16>>>NTD  Antibiotics: vanc 6/13>>>6/16 (rash) Zosyn 6/13>>>6/16 (rash) clinda (cellulitis) 6/16>>>6/17 cipro (cellulitis) 6/16>>>6/17 zyvox (cellulitis) 6/16>>> Aztreonam (cellulitis) 6/17>>> A:  LE Cellulitis w/ resulting severe sepsis. Her lactate is 3.2 but currently systolic SBP holding. Need further eval of LE to r/o progression of infection.Elita Boone about strep group, r/o nec fasc P:   -See abx noted above -Cultures sent and all NTD. -PCT 0.28. -CT LE negative for gas formation, only cellulitis. -Clinda was being used for toxin inhibition but evidently linezolid does the same thing, stopped cipro. -Appreciate input from ID, if febrile will change aztreonam to cipro again and reculture prior to that.  ENDOCRINE  Lab 05/18/12 0748 05/18/12 0409 05/17/12 2359 05/17/12 2006 05/17/12 1606  GLUCAP 125* 113* 165* 174* 140*   A: DM/ Hyperglycemia P:   -ssi  NEUROLOGIC  A:  Acute Encephalopathy: prob due to sepsis, fever and renal failure.  Essentially resolved. P:   -Supportive care  Derm: Diffuse erythemic rash: ? Drugs ? Sepsis.  P: -vanc and zosyn stopped, although I am more impressed that this is sepsis related . Rash may be toxic shock etc -monitor, may need to record allergy, avoid for now -Dermatology saw patient (appreciate input) recommend  topical steroids and continuation of systemic steroids but no evidence of SJS/TEN.  BEST PRACTICE / DISPOSITION Level of Care:  icu Primary Service:  pccm Consultants:  Cards and nephro Code Status:  full Diet:  npo DVT Px:  Heparin gtt GI Px:  ppi Skin Integrity:  Diffuse rash Social / Family:  Updated by med svc  Transfer to 2900 SDU, will observe overnight and likely give back to triad on 6/20.  Alyson Reedy, M.D. [facility] Pulmonary/Critical Care Medicine. Pager: [phone]. After hours pager: [phone].

## 2012-05-18 NOTE — Progress Notes (Signed)
INFECTIOUS DISEASE PROGRESS NOTE  ID: Pamela [name] is a 78 y.o. female with diabetes complicated by peripheral neuropathy and diabetic foot ulcer presents with right leg cellulitis and develops diffuse full body rash while on 48hrs of vancomycin and piptazo. Concern for drug rash vs. Toxic shock (staph or strep) related rash   Subjective: Afebrile nearly 48hrs. Still diffuse erythema, swelling of hands and lower extremities somewhat uncomfortable  Abtx: abtx day #6 linezolid #4 Aztreonam #3  Medications:     . amiodarone  200 mg Oral Daily  . amiodarone  400 mg Oral BID  . amiodarone  400 mg Oral Daily  . atorvastatin  40 mg Oral q1800  . aztreonam  1 g Intravenous Q8H  . calcium carbonate  1,000 mg of elemental calcium Oral TID  . furosemide  40 mg Intravenous Q8H  . insulin aspart  0-20 Units Subcutaneous Q4H  . insulin aspart  4 Units Subcutaneous TID WC  . insulin glargine  8 Units Subcutaneous QHS  . linezolid  600 mg Intravenous Q12H  . magnesium sulfate 1 - 4 g bolus IVPB  2 g Intravenous Once  . metoprolol tartrate  12.5 mg Oral BID  . pantoprazole  40 mg Oral Q1200  . polyethylene glycol  17 g Oral Daily  . potassium & sodium phosphates  2 packet Oral TID AC & HS     Objective: Vital signs in last 24 hours: Temp:  [97.6 F (36.4 C)-98.9 F (37.2 C)] 97.6 F (36.4 C) (06/19 2015) Pulse Rate:  [71-94] 78  (06/19 2015) Resp:  [16-22] 16  (06/19 2015) BP: (123-146)/(37-64) 123/57 mmHg (06/19 2015) SpO2:  [96 %-100 %] 99 % (06/19 2015) GEN= asleep, but easily awakened, in NAD HEENT: [place]/AT, Mucous membranes clear- no evidence of oral or ocular lesions/breakdown.  PULM= CTAB, no w/c/r Derm=RLE with diffuse erythema, warmth, and 2-3+ pitting edema.  LLE : distal mild erythema and mild edema.  RUE: erythema and purpura but not as warm or diffuse as compared to LUE.  LUE: edematous erythema and warmth, primarily on forearm. Purpuric patches on L wrist and  palm   Lab Results  Basename 05/18/12 0300 05/17/12 2030 05/17/12 0519  WBC 4.8 -- 7.7  HGB 9.5* -- 11.3*  HCT 28.9* -- 33.2*  NA 133* 131* --  K 4.5 4.7 --  CL 104 101 --  CO2 22 21 --  BUN 19 20 --  CREATININE 1.03 1.12* --  GLU -- -- --   Liver Panel  Basename 05/17/12 0515 05/16/12 2042  PROT 4.0* 4.2*  ALBUMIN 1.4* 1.4*  AST 12 17  ALT 11 14  ALKPHOS 56 59  BILITOT 0.2* 0.2*  BILIDIR <0.1 <0.1  IBILI NOT CALCULATED NOT CALCULATED   Sedimentation Rate No results found for this basename: ESRSEDRATE in the last 72 hours C-Reactive Protein No results found for this basename: CRP:2 in the last 72 hours  Microbiology: Recent Results (from the past 240 hour(s))  CULTURE, BLOOD (ROUTINE X 2)     Status: Normal (Preliminary result)   Collection Time   05/12/12  9:26 PM      Component Value Range Status Comment   Specimen Description BLOOD LEFT ARM   Final    Special Requests BOTTLES DRAWN AEROBIC AND ANAEROBIC   Final    Culture  Setup Time [id number]   Final    Culture     Final    Value:  BLOOD CULTURE RECEIVED NO GROWTH TO DATE CULTURE WILL BE HELD FOR 5 DAYS BEFORE ISSUING A FINAL NEGATIVE REPORT   Report Status PENDING   Incomplete   CULTURE, BLOOD (ROUTINE X 2)     Status: Normal (Preliminary result)   Collection Time   05/12/12  9:32 PM      Component Value Range Status Comment   Specimen Description BLOOD LEFT HAND   Final    Special Requests BOTTLES DRAWN AEROBIC ONLY   Final    Culture  Setup Time [id number]   Final    Culture     Final    Value:        BLOOD CULTURE RECEIVED NO GROWTH TO DATE CULTURE WILL BE HELD FOR 5 DAYS BEFORE ISSUING A FINAL NEGATIVE REPORT   Report Status PENDING   Incomplete   MRSA PCR SCREENING     Status: Normal   Collection Time   05/15/12 11:46 AM      Component Value Range Status Comment   MRSA by PCR NEGATIVE  NEGATIVE Final   CULTURE, BLOOD (ROUTINE X 2)     Status: Normal (Preliminary result)    Collection Time   05/15/12 12:27 PM      Component Value Range Status Comment   Specimen Description BLOOD LEFT FOOT   Final    Special Requests BOTTLES DRAWN AEROBIC ONLY 2CC   Final    Culture  Setup Time [id number]   Final    Culture     Final    Value:        BLOOD CULTURE RECEIVED NO GROWTH TO DATE CULTURE WILL BE HELD FOR 5 DAYS BEFORE ISSUING A FINAL NEGATIVE REPORT   Report Status PENDING   Incomplete   CULTURE, BLOOD (ROUTINE X 2)     Status: Normal (Preliminary result)   Collection Time   05/15/12 12:35 PM      Component Value Range Status Comment   Specimen Description BLOOD LEFT FOOT   Final    Special Requests BOTTLES DRAWN AEROBIC ONLY 8CC   Final    Culture  Setup Time [id number]   Final    Culture     Final    Value:        BLOOD CULTURE RECEIVED NO GROWTH TO DATE CULTURE WILL BE HELD FOR 5 DAYS BEFORE ISSUING A FINAL NEGATIVE REPORT   Report Status PENDING   Incomplete     Studies/Results: Dg Chest Port 1 View  05/18/2012  *RADIOLOGY REPORT*  Clinical Data: Hypoxic respiratory failure.  PORTABLE CHEST - 1 VIEW  Comparison: Chest x-ray 05/17/2012.  Findings: There is a right-sided internal jugular central venous catheter with tip terminating in the mid superior vena cava. Lung volumes are low.  There is a persistent left basilar opacity, favored to represent an area of atelectasis, however, underlying airspace consolidation from infection or aspiration is difficult to exclude.  Improving aeration at the right base suggests decreasing atelectasis.  Blunting of the left costophrenic sulcus, compatible with a small left-sided pleural effusion (similar to prior).  Mild pulmonary venous congestion without frank pulmonary edema.  Heart size is borderline enlarged.  Mediastinal contours are unremarkable.  Atherosclerotic calcifications within the arch of the aorta.  IMPRESSION: 1.  Slight improving bibasilar aeration with persistent left lower lobe subsegmental atelectasis and/or  consolidation. 2.  Small left-sided pleural effusion. 3.  Atherosclerosis.  Original Report Authenticated By: Florencia Reasons, M.D.   Dg Chest [facility]  05/17/2012  *RADIOLOGY REPORT*  Clinical Data: Left lower lobe infiltrate, shortness of breath.  PORTABLE CHEST - 1 VIEW  Comparison: 05/16/2012  Findings: Increasing bibasilar airspace opacities, left greater than right, atelectasis or infiltrates.  Possible small left pleural effusion.  Heart is normal size.  Right central line is unchanged.  IMPRESSION: Increasing bibasilar atelectasis or infiltrates, left greater than right.  Suspect small left effusion.  Original Report Authenticated By: Cyndie Chime, M.D.     Assessment/Plan: Febrile = no fevers over last 48hrs. Would repeat blood cx if she does have temp >100.3  Leukocytosis = back to baseline  erythamatous Rash = Chest and upper extremities marginally improved. Lower extremities and arms are same degree of erythema and swelling. I agree that this is not consistent with TEN/SJS.  Continue with topical steroid cream.  Left leg cellulitis= still edematous, continue with linezolid and aztreonam. If continues to improve, would d/c aztreonam tomorrow. tx for 10 days with linezolid  Hx of SJS= would get old records from [place] to read discharge summary from 2008 to understand details of event.   Kollin Udell Infectious Diseases 05/18/2012, 9:06 PM

## 2012-05-18 NOTE — Evaluation (Signed)
Physical Therapy Evaluation Patient Details Name: Pamela [name] MRN: [id number] DOB: 02/10/1934 Today's Date: 05/18/2012 Time: 0902-0929 PT Time Calculation (min): 27 min  PT Assessment / Plan / Recommendation Clinical Impression  Pt adm with RLE cellulitis and sepsis, then developed afib and currently is limited with mobility due to dizziness, imbalance, and generalized weakness. Pt will benefit from PT to incr safety and independence with mobility prior to d/c home.    PT Assessment  Patient needs continued PT services    Follow Up Recommendations  Home health PT;Supervision - Intermittent    Barriers to Discharge None      lEquipment Recommendations  None recommended by PT    Recommendations for Other Services OT consult   Frequency Min 3X/week    Precautions / Restrictions Precautions Precautions: Fall Restrictions Weight Bearing Restrictions: No   Pertinent Vitals/Pain VSS on monitor; no reports of pain      Mobility  Bed Mobility Bed Mobility: Rolling Right;Right Sidelying to Sit;Sitting - Scoot to Edge of Bed Rolling Right: 4: Min assist;With rail Right Sidelying to Sit: 4: Min assist;HOB flat;With rails Sitting - Scoot to Edge of Bed: 5: Supervision Details for Bed Mobility Assistance: Pt with incr time and effort with all movements; supervision for scooting due to imbalance, although pt able to ultimately maintain balance without assist Transfers Transfers: Sit to Stand;Stand to Sit Sit to Stand: 4: Min assist;With upper extremity assist;From bed Stand to Sit: 4: Min guard;With upper extremity assist;With armrests;To chair/3-in-1 Details for Transfer Assistance: Pt required cues for safe use of RW/hand placement with transitions; min assist for balance and due to dizziness Ambulation/Gait Ambulation/Gait Assistance: 4: Min assist Ambulation Distance (Feet): 30 Feet Assistive device: Rolling walker Ambulation/Gait Assistance Details: Pt required frequent  cues to get closer to RW; assist to steer RW Gait Pattern: Step-to pattern;Trunk flexed    Exercises     PT Diagnosis: Difficulty walking;Generalized weakness  PT Problem List: Decreased activity tolerance;Decreased balance;Decreased mobility;Decreased knowledge of use of DME;Impaired sensation PT Treatment Interventions: DME instruction;Gait training;Stair training;Functional mobility training;Therapeutic activities;Therapeutic exercise;Balance training;Patient/family education   PT Goals Acute Rehab PT Goals PT Goal Formulation: With patient Time For Goal Achievement: 05/25/12 Potential to Achieve Goals: Good Pt will go Supine/Side to Sit: with supervision;with HOB 0 degrees PT Goal: Supine/Side to Sit - Progress: Goal set today Pt will go Sit to Supine/Side: with supervision;with HOB 0 degrees PT Goal: Sit to Supine/Side - Progress: Goal set today Pt will go Sit to Stand: with supervision;with upper extremity assist PT Goal: Sit to Stand - Progress: Goal set today Pt will Ambulate: 51 - 150 feet;with supervision;with least restrictive assistive device PT Goal: Ambulate - Progress: Goal set today Pt will Go Up / Down Stairs: 1-2 stairs;with min assist;with least restrictive assistive device PT Goal: Up/Down Stairs - Progress: Goal set today  Visit Information  Last PT Received On: 05/18/12 Assistance Needed: +1    Subjective Data  Subjective: "Wow, that leg looks awful" Patient Stated Goal: To return home as independent as possible   Prior Functioning  Home Living Lives With: Spouse Available Help at Discharge: Family;Available 24 hours/day (his balance is not so good anymore) Type of Home: House Home Access: Stairs to enter [employer] of Steps: 1+1 Entrance Stairs-Rails: None Home Layout: Two level;Able to live on main level with bedroom/bathroom Bathroom Shower/Tub: Health visitor: Handicapped height Bathroom Accessibility: Yes How  Accessible: Accessible via walker Home Adaptive Equipment: Walker - rolling Prior Function Level  of Independence: Independent Able to Take Stairs?: Yes Driving: Yes Communication Communication: No difficulties Dominant Hand: Right    Cognition  Overall Cognitive Status: Appears within functional limits for tasks assessed/performed Arousal/Alertness: Awake/alert Orientation Level: Appears intact for tasks assessed Behavior During Session: [facility] for tasks performed    Extremity/Trunk Assessment Right Upper Extremity Assessment RUE ROM/Strength/Tone: [facility] for tasks assessed Left Upper Extremity Assessment LUE ROM/Strength/Tone: WFL for tasks assessed Right Lower Extremity Assessment RLE ROM/Strength/Tone: WFL for tasks assessed RLE Sensation: History of peripheral neuropathy RLE Coordination: WFL - gross motor Left Lower Extremity Assessment LLE ROM/Strength/Tone: WFL for tasks assessed LLE Sensation: History of peripheral neuropathy LLE Coordination: WFL - gross motor Trunk Assessment Trunk Assessment: Normal   Balance    End of Session PT - End of Session Equipment Utilized During Treatment: Gait belt Activity Tolerance: Other (comment) (limited by dizziness; denied vertigo) Patient left: in chair;with call bell/phone within reach Nurse Communication: Mobility status   Kamilla Hands 05/18/2012, 9:44 AM  Pager [phone]

## 2012-05-18 NOTE — Progress Notes (Signed)
Subjective:  Up in bed, no SOB  Objective:  Vital Signs in the last 24 hours: Temp:  [97.9 F (36.6 C)-98.6 F (37 C)] 98.3 F (36.8 C) (06/19 0413) Pulse Rate:  [82-94] 85  (06/19 0605) Resp:  [16-22] 18  (06/19 0605) BP: (109-146)/(44-59) 123/48 mmHg (06/19 0605) SpO2:  [96 %-100 %] 97 % (06/19 0605)  Intake/Output from previous day:  Intake/Output Summary (Last 24 hours) at 05/18/12 0820 Last data filed at 05/18/12 0700  Gross per 24 hour  Intake 2375.12 ml  Output   2725 ml  Net -349.88 ml    Physical Exam: General appearance: alert, cooperative and no distress No JVD Lungs: decreased breath sounds at bases; no wheezing Heart: regular rate and rhythm 1/6 sem BS + Skin: diffuse red rash on arms trunk, face and legs, non blanching,  Confluent but also pettecchial on arm   Rate: 88  Rhythm: normal sinus rhythm  Lab Results:  Basename 05/18/12 0300 05/17/12 0519  WBC 4.8 7.7  HGB 9.5* 11.3*  PLT 169 175    Basename 05/18/12 0300 05/17/12 2030  NA 133* 131*  K 4.5 4.7  CL 104 101  CO2 22 21  GLUCOSE 133* 178*  BUN 19 20  CREATININE 1.03 1.12*    Basename 05/16/12 0353 05/15/12 1830  TROPONINI <0.30 <0.30   Hepatic Function Panel  Basename 05/17/12 0515  PROT 4.0*  ALBUMIN 1.4*  AST 12  ALT 11  ALKPHOS 56  BILITOT 0.2*  BILIDIR <0.1  IBILI NOT CALCULATED   No results found for this basename: CHOL in the last 72 hours No results found for this basename: INR in the last 72 hours    Cardiac Studies:  Assessment/Plan:   Principal Problem:  *Cellulitis in diabetic foot  Active Problems:   Septic shock on admission  Afib, paroxysmal, with RVR at times with BBB  Acute kidney failure, resolved, pk SCr 1.89  Allergic drug rash, ? Cardizem, ?Antibiotics, ? 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 She will need follow up TSH and free T4. PO Protonix if OK with CCM.   Corine Shelter PA-C 05/18/2012, 8:20 AM   Patient seen and examined. Agree with assessment and plan. Sinus rhythm without recurrent AF.  Will switch to oral amiodarone and resume oral beta blockade. OK from cardiac status to move to stepdown.   Lennette Bihari, MD, [facility] 05/18/2012 10:07 AM

## 2012-05-19 DIAGNOSIS — L03119 Cellulitis of unspecified part of limb: Secondary | ICD-10-CM

## 2012-05-19 DIAGNOSIS — L02619 Cutaneous abscess of unspecified foot: Secondary | ICD-10-CM

## 2012-05-19 LAB — BASIC METABOLIC PANEL
BUN: 17 mg/dL (ref 6–23)
Glucose, Bld: 86 mg/dL (ref 70–99)
Sodium: 142 mEq/L (ref 135–145)

## 2012-05-19 LAB — CBC
MCH: 29.8 pg (ref 26.0–34.0)
MCV: 89.4 fL (ref 78.0–100.0)
Platelets: 179 10*3/uL (ref 150–400)
RBC: 3.22 MIL/uL — ABNORMAL LOW (ref 3.87–5.11)
RDW: 15.2 % (ref 11.5–15.5)
WBC: 5.5 10*3/uL (ref 4.0–10.5)

## 2012-05-19 LAB — CULTURE, BLOOD (ROUTINE X 2)
Culture  Setup Time: 201306140244
Culture  Setup Time: 201306140244
Culture: NO GROWTH

## 2012-05-19 LAB — GLUCOSE, CAPILLARY
Glucose-Capillary: 92 mg/dL (ref 70–99)
Glucose-Capillary: 102 mg/dL — ABNORMAL HIGH (ref 70–99)

## 2012-05-19 MED ORDER — MAGNESIUM SULFATE 40 MG/ML IJ SOLN
2.0000 g | Freq: Once | INTRAMUSCULAR | Status: AC
  Administered 2012-05-19: 2 g via INTRAVENOUS
  Filled 2012-05-19: qty 50

## 2012-05-19 MED ORDER — APIXABAN 5 MG PO TABS
5.0000 mg | ORAL_TABLET | Freq: Two times a day (BID) | ORAL | Status: DC
  Administered 2012-05-19 – 2012-05-23 (×9): 5 mg via ORAL
  Filled 2012-05-19 (×11): qty 1

## 2012-05-19 MED ORDER — FUROSEMIDE 10 MG/ML IJ SOLN
40.0000 mg | Freq: Three times a day (TID) | INTRAMUSCULAR | Status: AC
  Administered 2012-05-19 (×2): 40 mg via INTRAVENOUS
  Filled 2012-05-19 (×2): qty 4

## 2012-05-19 MED ORDER — METOPROLOL TARTRATE 25 MG PO TABS
25.0000 mg | ORAL_TABLET | Freq: Two times a day (BID) | ORAL | Status: DC
  Administered 2012-05-19 – 2012-05-20 (×3): 25 mg via ORAL
  Filled 2012-05-19 (×4): qty 1

## 2012-05-19 NOTE — Progress Notes (Signed)
Pt vomited while being pulled up in bed.  Emesis appeared brown in color.  MD has been made aware.  Antiemetic to be given as previously ordered.  Pt also had a 7 beat run of vtach on the monitor.  Pt complained of being "able to tell something was going on in chest for the first time".  MD has been made aware.  No new orders at this time.  Will continue to monitor. Nino Glow RN

## 2012-05-19 NOTE — Progress Notes (Signed)
ANTIBIOTIC CONSULT NOTE - FOLLOW UP  Pharmacy Consult: Azactam Indication:  Cellulitis   Allergies  Allergen Reactions  . Sulfa Antibiotics     unknown    Patient Measurements: Height: 5\' 5"  (165.1 cm) Weight: 195 lb 5.2 oz (88.6 kg) IBW/kg (Calculated) : 57   Vital Signs: Temp: 97.8 F (36.6 C) (06/20 0810) Temp src: Oral (06/20 0810) BP: 133/64 mmHg (06/20 0810) Pulse Rate: 90  (06/20 0810) Intake/Output from previous day: 06/19 0701 - 06/20 0700 In: 2356.8 [P.O.:1120; I.V.:832.8; IV Piggyback:404] Out: 6700 [Urine:6700] Intake/Output from this shift: Total I/O In: 43 [I.V.:43] Out: -   Labs:  Basename 05/19/12 0430 05/18/12 0300 05/17/12 2030 05/17/12 0519  WBC 5.5 4.8 -- 7.7  HGB 9.6* 9.5* -- 11.3*  PLT 179 169 -- 175  LABCREA -- -- -- --  CREATININE 0.96 1.03 1.12* --   Estimated Creatinine Clearance: 54.8 ml/min (by C-G formula based on Cr of 0.96). No results found for this basename: VANCOTROUGH:2,VANCOPEAK:2,VANCORANDOM:2,GENTTROUGH:2,GENTPEAK:2,GENTRANDOM:2,TOBRATROUGH:2,TOBRAPEAK:2,TOBRARND:2,AMIKACINPEAK:2,AMIKACINTROU:2,AMIKACIN:2, in the last 72 hours   Microbiology: Recent Results (from the past 720 hour(s))  CULTURE, BLOOD (ROUTINE X 2)     Status: Normal   Collection Time   05/12/12  9:26 PM      Component Value Range Status Comment   Specimen Description BLOOD LEFT ARM   Final    Special Requests BOTTLES DRAWN AEROBIC AND ANAEROBIC   Final    Culture  Setup Time [id number]   Final    Culture NO GROWTH 5 DAYS   Final    Report Status 05/19/2012 FINAL   Final   CULTURE, BLOOD (ROUTINE X 2)     Status: Normal   Collection Time   05/12/12  9:32 PM      Component Value Range Status Comment   Specimen Description BLOOD LEFT HAND   Final    Special Requests BOTTLES DRAWN AEROBIC ONLY   Final    Culture  Setup Time [id number]   Final    Culture NO GROWTH 5 DAYS   Final    Report Status 05/19/2012 FINAL   Final   MRSA PCR SCREENING      Status: Normal   Collection Time   05/15/12 11:46 AM      Component Value Range Status Comment   MRSA by PCR NEGATIVE  NEGATIVE Final   CULTURE, BLOOD (ROUTINE X 2)     Status: Normal (Preliminary result)   Collection Time   05/15/12 12:27 PM      Component Value Range Status Comment   Specimen Description BLOOD LEFT FOOT   Final    Special Requests BOTTLES DRAWN AEROBIC ONLY 2CC   Final    Culture  Setup Time [id number]   Final    Culture     Final    Value:        BLOOD CULTURE RECEIVED NO GROWTH TO DATE CULTURE WILL BE HELD FOR 5 DAYS BEFORE ISSUING A FINAL NEGATIVE REPORT   Report Status PENDING   Incomplete   CULTURE, BLOOD (ROUTINE X 2)     Status: Normal (Preliminary result)   Collection Time   05/15/12 12:35 PM      Component Value Range Status Comment   Specimen Description BLOOD LEFT FOOT   Final    Special Requests BOTTLES DRAWN AEROBIC ONLY [facility]   Final    Culture  Setup Time [id number]   Final    Culture     Final    Value:  BLOOD CULTURE RECEIVED NO GROWTH TO DATE CULTURE WILL BE HELD FOR 5 DAYS BEFORE ISSUING A FINAL NEGATIVE REPORT   Report Status PENDING   Incomplete        Assessment: 73 YOF with RLE cellulitis (diabetic foot ulcer) and rash.  Patient has an extensive h/o of antibiotic use due to concerns of drug-related rash.  Now on Azactam and Zyvox with plans to possibly discontinuing Azactam today and continue Zyvox for a total of 10 days.  Current dosing are appropriate.   6/13 Zosyn >> 6/16 6/13 Vancomycin >> 6/16 6/16 > Cipro, Clindamycin >>6/17 (on Cipro PTA) 6/16 Linezolid >> (plan d/c 6/26) 6/17 Azactam >> (plan d/c 6/20)  6/16: blood: NGTD 6/13: Blood: negative     Plan:  - Continue Azactam 1gm IV Q8H  - Continue Zyvox 600mg  IV Q12H - Monitor renal fxn, clinical course

## 2012-05-19 NOTE — Progress Notes (Signed)
INFECTIOUS DISEASE PROGRESS NOTE  ID: Pamela [name] is a 78 y.o. female with diabetes complicated by peripheral neuropathy and diabetic foot ulcer presents with right leg cellulitis and develops diffuse full body rash while on 48hrs of vancomycin and piptazo. Concern for drug rash vs. Toxic shock (staph or strep) related rash   Subjective: Afebrile x 3 days. Doing better, rash is improving  Abtx: abtx day #7 linezolid #5 Aztreonam #4  Medications:     . amiodarone  200 mg Oral Daily  . amiodarone  400 mg Oral BID  . amiodarone  400 mg Oral Daily  . apixaban  5 mg Oral BID  . atorvastatin  40 mg Oral q1800  . aztreonam  1 g Intravenous Q8H  . calcium carbonate  1,000 mg of elemental calcium Oral TID  . furosemide  40 mg Intravenous Q8H  . furosemide  40 mg Intravenous Q8H  . insulin aspart  0-20 Units Subcutaneous Q4H  . insulin aspart  4 Units Subcutaneous TID WC  . insulin glargine  8 Units Subcutaneous QHS  . linezolid  600 mg Intravenous Q12H  . magnesium sulfate 1 - 4 g bolus IVPB  2 g Intravenous Once  . metoprolol tartrate  25 mg Oral BID  . pantoprazole  40 mg Oral Q1200  . polyethylene glycol  17 g Oral Daily  . potassium & sodium phosphates  2 packet Oral TID AC & HS  . DISCONTD: metoprolol tartrate  12.5 mg Oral BID     Objective: Vital signs in last 24 hours: Temp:  [97.4 F (36.3 C)-97.8 F (36.6 C)] 97.7 F (36.5 C) (06/20 1615) Pulse Rate:  [70-90] 70  (06/20 1615) Resp:  [16-23] 23  (06/20 1615) BP: (123-155)/(57-72) 142/60 mmHg (06/20 1615) SpO2:  [96 %-99 %] 97 % (06/20 1615) GEN= asleep, but easily awakened, in NAD HEENT: [place]/AT, Mucous membranes clear- no evidence of oral or ocular lesions/breakdown.  PULM= CTAB, no w/c/r Derm=RLE with diffuse erythema, warmth, and 2-3+ pitting edema.  LLE : distal mild erythema and mild edema.  RUE: erythema and purpura but not as warm or diffuse as compared to LUE.  LUE: edematous erythema and warmth,  primarily on forearm. Purpuric patches on L wrist and palm   Lab Results  Basename 05/19/12 0430 05/18/12 0300  WBC 5.5 4.8  HGB 9.6* 9.5*  HCT 28.8* 28.9*  NA 142 133*  K 4.3 4.5  CL 106 104  CO2 23 22  BUN 17 19  CREATININE 0.96 1.03  GLU -- --   Liver Panel  Basename 05/17/12 0515 05/16/12 2042  PROT 4.0* 4.2*  ALBUMIN 1.4* 1.4*  AST 12 17  ALT 11 14  ALKPHOS 56 59  BILITOT 0.2* 0.2*  BILIDIR <0.1 <0.1  IBILI NOT CALCULATED NOT CALCULATED   Sedimentation Rate No results found for this basename: ESRSEDRATE in the last 72 hours C-Reactive Protein No results found for this basename: CRP:2 in the last 72 hours  Microbiology: Recent Results (from the past 240 hour(s))  CULTURE, BLOOD (ROUTINE X 2)     Status: Normal   Collection Time   05/12/12  9:26 PM      Component Value Range Status Comment   Specimen Description BLOOD LEFT ARM   Final    Special Requests BOTTLES DRAWN AEROBIC AND ANAEROBIC   Final    Culture  Setup Time [id number]   Final    Culture NO GROWTH 5 DAYS   Final  Report Status 05/19/2012 FINAL   Final   CULTURE, BLOOD (ROUTINE X 2)     Status: Normal   Collection Time   05/12/12  9:32 PM      Component Value Range Status Comment   Specimen Description BLOOD LEFT HAND   Final    Special Requests BOTTLES DRAWN AEROBIC ONLY   Final    Culture  Setup Time [id number]   Final    Culture NO GROWTH 5 DAYS   Final    Report Status 05/19/2012 FINAL   Final   MRSA PCR SCREENING     Status: Normal   Collection Time   05/15/12 11:46 AM      Component Value Range Status Comment   MRSA by PCR NEGATIVE  NEGATIVE Final   CULTURE, BLOOD (ROUTINE X 2)     Status: Normal (Preliminary result)   Collection Time   05/15/12 12:27 PM      Component Value Range Status Comment   Specimen Description BLOOD LEFT FOOT   Final    Special Requests BOTTLES DRAWN AEROBIC ONLY 2CC   Final    Culture  Setup Time [id number]   Final    Culture     Final     Value:        BLOOD CULTURE RECEIVED NO GROWTH TO DATE CULTURE WILL BE HELD FOR 5 DAYS BEFORE ISSUING A FINAL NEGATIVE REPORT   Report Status PENDING   Incomplete   CULTURE, BLOOD (ROUTINE X 2)     Status: Normal (Preliminary result)   Collection Time   05/15/12 12:35 PM      Component Value Range Status Comment   Specimen Description BLOOD LEFT FOOT   Final    Special Requests BOTTLES DRAWN AEROBIC ONLY 8CC   Final    Culture  Setup Time [id number]   Final    Culture     Final    Value:        BLOOD CULTURE RECEIVED NO GROWTH TO DATE CULTURE WILL BE HELD FOR 5 DAYS BEFORE ISSUING A FINAL NEGATIVE REPORT   Report Status PENDING   Incomplete     Studies/Results: Dg Chest Port 1 View  05/18/2012  *RADIOLOGY REPORT*  Clinical Data: Hypoxic respiratory failure.  PORTABLE CHEST - 1 VIEW  Comparison: Chest x-ray 05/17/2012.  Findings: There is a right-sided internal jugular central venous catheter with tip terminating in the mid superior vena cava. Lung volumes are low.  There is a persistent left basilar opacity, favored to represent an area of atelectasis, however, underlying airspace consolidation from infection or aspiration is difficult to exclude.  Improving aeration at the right base suggests decreasing atelectasis.  Blunting of the left costophrenic sulcus, compatible with a small left-sided pleural effusion (similar to prior).  Mild pulmonary venous congestion without frank pulmonary edema.  Heart size is borderline enlarged.  Mediastinal contours are unremarkable.  Atherosclerotic calcifications within the arch of the aorta.  IMPRESSION: 1.  Slight improving bibasilar aeration with persistent left lower lobe subsegmental atelectasis and/or consolidation. 2.  Small left-sided pleural effusion. 3.  Atherosclerosis.  Original Report Authenticated By: Florencia Reasons, M.D.     Assessment/Plan: Left leg cellulitis= still edematous, continue with linezolid and will d/c aztreonam today. Can  switch to orals linezolid. tx for 10 days with linezolid, thus only needs 5 more days.  erythamatous Rash = Chest and upper extremities is improved. Lower extremities and arms are same degree of erythema and swelling. I agree  that this is not consistent with TEN/SJS.  Continue with topical steroid cream.  Febrile = no fevers over last 3 days. Would repeat blood cx if she does have temp >100.3  Hx of SJS= would get old records from [place] to read discharge summary from 2008 to understand details of event.  Will sign off.  Rozell Theiler Infectious Diseases 05/19/2012, 5:45 PM

## 2012-05-19 NOTE — Progress Notes (Signed)
TRANSFERRED TO [id number] BY WHEELCHAIR, STABLE, BELONGINGS WITH PT.

## 2012-05-19 NOTE — Progress Notes (Signed)
Name: Pamela [name] MRN: [id number] DOB: 1934/06/27    LOS: 7  Referring Provider:  Lavera Guise Reason for Referral:  Acidosis, mental status change  PULMONARY / CRITICAL CARE MEDICINE  Brief patient description:   78 y.o admitted 6/13 w/ RLE cellulitis, sepsis, and SVT. Initially it appeared as though she was improving clinically, with the exception of slight bump in her creatinine. On 6/16 moved to ICU as found more somnolent, had developed progressive renal failure, diffuse rash and urticaria, as well as metabolic acidosis, hyperkalemia and AF w/ RVR. Her RLE appeared to have become more erythremic and swelling had progressed, also she had developed a diffuse rash. PCCM was asked to eval given concern for sepsis and progressive metabolic acidosis and also concern for possible adverse drug reaction to her antibiotic therapy.   Events Since Admission: Moved to ICU, placed on amiodarone gtt  Current Status: critical Vital Signs: Temp:  [97.4 F (36.3 C)-98.9 F (37.2 C)] 97.8 F (36.6 C) (06/20 0810) Pulse Rate:  [71-90] 90  (06/20 0810) Resp:  [16-21] 17  (06/20 0810) BP: (123-150)/(53-64) 133/64 mmHg (06/20 0810) SpO2:  [96 %-100 %] 96 % (06/20 0810)  Physical Examination: General:  Acutely ill appearing female.  Neuro:  Very easily arousable and interactive, follows commands. HEENT:  MM are dry, No JVD Cardiovascular:  Tachy irreg irreg  Lungs:  Clear, decreased Abdomen:  Soft, non-tender, No OM, hypoactive Musculoskeletal:  RLE is swollen/edematous, erythremic, warm to touch. It appears that the erythema has worsened.  Skin:  RLE is swollen, warm to touch, and erythremic. She has developed a diffuse rash over her entire body with worsening left forearm erythema.  Intake/Output Summary (Last 24 hours) at 05/19/12 1114 Last data filed at 05/19/12 1000  Gross per 24 hour  Intake   1657 ml  Output   6250 ml  Net  -4593 ml   Principal Problem:  *Cellulitis in diabetic  foot Active Problems:  DM (diabetes mellitus)  HTN (hypertension)  Hypercholesterolemia  Afib, paroxysmal, with RVR at times with BBB  Anemia  Diastolic dysfunction, left ventricle  Acute kidney failure  Allergic drug rash  Stevens-Johnson syndrome  Septic shock  ASSESSMENT AND PLAN  PULMONARY  Lab 05/15/12 1737 05/15/12 1152  PHART -- 7.325*  PCO2ART -- 32.1*  PO2ART -- 90.0  HCO3 -- 16.3*  O2SAT 85.9 95.0   Ventilator Settings:   CXR:  No infiltrates. CVL in good position ETT:    A:  No acute pulmonary pathology. Currently she is compensating for her metabolic acidosis. Done well overnight, now off O2. P:   -Titrate O2 as tolerated. For sat of 88-92%.  -ABG reviewed, no need for repeat at this time.  CARDIOVASCULAR  Lab 05/16/12 0354 05/16/12 0353 05/15/12 1830 05/15/12 1500 05/15/12 1205 05/15/12 1125 05/15/12 1015  TROPONINI -- <0.30 <0.30 <0.30 -- <0.30 --  LATICACIDVEN 2.1 -- -- 1.3 1.7 -- 3.2*  PROBNP -- -- -- -- -- -- --   ECG:  Af w/ RVR, wide complex QRS Lines: Right IJ CVL 6/16>>>  ECHO 6/16: Left ventricle: The cavity size was normal. Wall thickness was increased in a pattern of moderate to severe LVH. Theestimated ejection fraction was 65%. Wall motion was normal; there were no regional wall motion abnormalities. Doppler parameters are consistent with abnormal left ventricular relaxation (grade 1 diastolic dysfunction). - Right ventricle: The cavity size was normal. Systolic function was normal.  A: AF w/ RVR, has wide QRS. Cards following. Suspect that  this is a response to her acute illness, as she has been in and out of AF. No doubt also being driven by fever.  P:  -Amio drip changed to PO. -Central access, will keep in due to difficulty of IV access. -Keep off pradaxa for now given renal injury and continue heparin drip while actively diuresing.  A:SIRS/Sepsis. Currently has good SBP, w/ lactate boarder line at 2.1.  P: -KVO IVF. -Diureses  today. -Stopped CCB. -Cortisol level 21.9.  No need for stress dose steroids. -CT with cellulitis but no free gas or nec fasc.  RENAL  Lab 05/19/12 0430 05/18/12 0300 05/17/12 2030 05/17/12 1345 05/17/12 0740 05/17/12 0515  NA 142 133* 131* 131* 134* --  K 4.3 4.5 -- -- -- --  CL 106 104 101 100 103 --  CO2 23 22 21 20 21  --  BUN 17 19 20 21 22  --  CREATININE 0.96 1.03 1.12* 1.19* 1.14* --  CALCIUM 8.5 8.1* 7.6* 7.7* 7.2* --  MG 1.6 1.6 -- -- -- 1.3*  PHOS 4.0 2.4 -- -- -- 2.5   Intake/Output      06/19 0701 - 06/20 0700 06/20 0701 - 06/21 0700   P.O. 1120    I.V. (mL/kg) 832.8 (9.4) 129 (1.5)   IV Piggyback 404    Total Intake(mL/kg) 2356.8 (26.6) 129 (1.5)   Urine (mL/kg/hr) 6700 (3.2)    Total Output 6700    Net -4343.2 +129         Foley:    A: Acute renal Failure: unclear if this is ATN in setting of sepsis and Ace-I, due to possible nephritis s/p strep, or possibly drug induced after abx ?r/o IN,  nephro consulted.  Appreciate input.  Anticipate as the patient continues to be in sinus rhythem and BP is more stable renal function will essentially normalize.  P: -Urine sediment not indicating any renal pathology, likely pre-renal. -Renal US noted, no hydro. -Avoid nephro-toxins. -Abx as below. -Renal dose meds. -F/U chemistry in AM. -KVO IVF. -Two doses of lasix today.  A:Metabolic Acidosis Both non-anion gap/and anion gap: prob due to RVR and her AKI.  P: -D/Ced bicarb drip. -Close obs of chemistry.  A:hypokalemia P: -No need for replacement. -F/U BMET. -Replace K, Mg and Phos with diureses.  GASTROINTESTINAL  Lab 05/17/12 0515 05/16/12 2042  AST 12 17  ALT 11 14  ALKPHOS 56 59  BILITOT 0.2* 0.2*  PROT 4.0* 4.2*  ALBUMIN 1.4* 1.4*    A:  No Acute issues P:   -SUP ordered, change to PO. -Diabetic diet.  HEMATOLOGIC  Lab 05/19/12 0430 05/18/12 0300 05/17/12 0519 05/16/12 0353 05/15/12 0620 05/13/12 0555  HGB 9.6* 9.5* 11.3* 12.6 12.0 --   HCT 28.8* 28.9* 33.2* 37.4 35.8* --  PLT 179 169 175 190 214 --  INR -- -- -- -- -- 1.18  APTT -- -- -- -- -- --   A: Has moderate Anemia at baseline. Hgb of 12 may reflect hemoconcentration, as she has third spaced significantly and may still be vascularly dry. P:  -Trend CBC -Transfuse for hgb < 7, unless she develops shock  INFECTIOUS  Lab 05/19/12 0430 05/18/12 0300 05/17/12 0519 05/16/12 0353 05/15/12 1015 05/15/12 0620  WBC 5.5 4.8 7.7 15.2* -- 11.6*  PROCALCITON -- -- -- 0.28 0.23 --   Cultures: 6/13 BCX2 6/13>>>NTD BCX2 6/16>>>NTD  Antibiotics: vanc 6/13>>>6/16 (rash) Zosyn 6/13>>>6/16 (rash) clinda (cellulitis) 6/16>>>6/17 cipro (cellulitis) 6/16>>>6/17 zyvox (cellulitis) 6/16>>> Aztreonam (cellulitis) 6/17>>> A:  LE Cellulitis w/ resulting severe sepsis. Her lactate is 3.2 but currently systolic SBP holding. Need further eval of LE to r/o progression of infection.Elita Boone about strep group, r/o nec fasc P:   -See abx noted above -Cultures sent and all NTD. -PCT 0.28. -CT LE negative for gas formation, only cellulitis. -Clinda was being used for toxin inhibition but evidently linezolid does the same thing, stopped cipro. -Appreciate input from ID, if febrile will change aztreonam to cipro again and reculture prior to that.  ENDOCRINE  Lab 05/19/12 0815 05/19/12 0346 05/19/12 05/18/12 2051 05/18/12 1659  GLUCAP 102* 92 152* 140* 169*   A: DM/ Hyperglycemia P:   -ssi  NEUROLOGIC  A:  Acute Encephalopathy: prob due to sepsis, fever and renal failure.  Essentially resolved. P:   -Supportive care  Derm: Diffuse erythemic rash: ? Drugs ? Sepsis.  P: -vanc and zosyn stopped, although I am more impressed that this is sepsis related . Rash may be toxic shock etc -monitor, may need to record allergy, avoid for now -Dermatology saw patient (appreciate input) recommend topical steroids and continuation of systemic steroids but no evidence of SJS/TEN.  BEST  PRACTICE / DISPOSITION Level of Care:  icu Primary Service:  pccm Consultants:  Cards and nephro Code Status:  full Diet:  npo DVT Px:  Heparin gtt GI Px:  ppi Skin Integrity:  Diffuse rash Social / Family:  Updated by med svc  Transfer to tele and triad to pick up on 6/21.  PCCM will sign off, please call back if needed.  Alyson Reedy, M.D. [facility] Pulmonary/Critical Care Medicine. Pager: [phone]. After hours pager: [phone].

## 2012-05-19 NOTE — Progress Notes (Signed)
Pt has arrived to unit and been placed on the heart monitor.  Pt currently in NSR.  Admission vitals and weight being performed at this time.  Pt has been oriented to room and to the unit.  Pt currently resting comfortably in bed and appears in no acute distress. Nino Glow RN

## 2012-05-19 NOTE — Progress Notes (Signed)
Pt. Seen and examined. Agree with the NP/PA-C note as written.   Maintaining sinus rhythm on po amiodarone. Agree with uptitration of b-blocker. No other active cardiac issues.  Will discontinue heparin and transition to Eliquis 5 mg BID today. Would consider discontinuing central line and foley catheter per primary service today. She now has a very erythematous, confluent rash - distribution is atypical for amiodarone and may be related to antibiotics. No signs of SJS.   Chrystie Nose, MD, [facility] Attending Cardiologist [facility]

## 2012-05-19 NOTE — Progress Notes (Signed)
[facility]  Subjective: Swelling in hands improving. No palpitations  Objective: Vital signs in last 24 hours: Temp:  [97.4 F (36.3 C)-98.9 F (37.2 C)] 97.6 F (36.4 C) (06/20 0342) Pulse Rate:  [71-87] 73  (06/20 0342) Resp:  [16-21] 20  (06/20 0342) BP: (123-150)/(37-64) 132/60 mmHg (06/20 0342) SpO2:  [96 %-100 %] 98 % (06/20 0342) Last BM Date: 05/12/12  Intake/Output from previous day: 06/19 0701 - 06/20 0700 In: 2213.8 [P.O.:1020; I.V.:789.8; IV Piggyback:404] Out: 6700 [Urine:6700] Intake/Output this shift:    Medications Current Facility-Administered Medications  Medication Dose Route Frequency Provider Last Rate Last Dose  . 0.9 %  sodium chloride infusion   Intravenous Continuous PRN Nelda Bucks, MD 10 mL/hr at 05/15/12 1900    . 0.9 %  sodium chloride infusion   Intravenous Continuous Nelda Bucks, MD 20 mL/hr at 05/16/12 0700    . acetaminophen (TYLENOL) tablet 650 mg  650 mg Oral Q6H PRN Sorin Luanne Bras, MD   650 mg at 05/18/12 1351  . amiodarone (PACERONE) tablet 200 mg  200 mg Oral Daily Eda Paschal [place], [place]      . amiodarone (PACERONE) tablet 400 mg  400 mg Oral BID Eda Paschal [place], [place]   400 mg at 05/18/12 2106  . amiodarone (PACERONE) tablet 400 mg  400 mg Oral Daily Eda Paschal [place], [place]      . atorvastatin (LIPITOR) tablet 40 mg  40 mg Oral q1800 Houston Siren, MD   40 mg at 05/18/12 1805  . aztreonam (AZACTAM) 1 g in dextrose 5 % 50 mL IVPB  1 g Intravenous Q8H Benny Lennert, PHARMD   1 g at 05/19/12 0610  . calcium carbonate (OS-CAL - dosed in mg of elemental calcium) tablet 1,000 mg of elemental calcium  1,000 mg of elemental calcium Oral TID Cecille Aver, MD   1,000 mg of elemental calcium at 05/18/12 2106  . camphor-menthol (SARNA) lotion   Topical PRN Zigmund Gottron, MD      . diphenhydrAMINE (BENADRYL) injection 12.5 mg  12.5 mg Intravenous Q6H PRN Zigmund Gottron, MD   12.5 mg at 05/18/12 0621    . furosemide (LASIX) injection 40 mg  40 mg Intravenous Q8H Alyson Reedy, MD   40 mg at 05/18/12 1804  . heparin ADULT infusion 100 units/mL (25000 units/250 mL)  1,300 Units/hr Intravenous Continuous Colleen Can, PHARMD 13 mL/hr at 05/19/12 0140 1,300 Units/hr at 05/19/12 0140  . insulin aspart (novoLOG) injection 0-20 Units  0-20 Units Subcutaneous Q4H Nelda Bucks, MD   4 Units at 05/19/12 0047  . insulin aspart (novoLOG) injection 4 Units  4 Units Subcutaneous TID WC Houston Siren, MD   4 Units at 05/18/12 1804  . insulin glargine (LANTUS) injection 8 Units  8 Units Subcutaneous QHS Sorin Luanne Bras, MD   8 Units at 05/18/12 2104  . linezolid (ZYVOX) IVPB 600 mg  600 mg Intravenous Q12H Simonne Martinet, NP   600 mg at 05/18/12 2106  . magnesium sulfate IVPB 2 g 50 mL  2 g Intravenous Once Alyson Reedy, MD   2 g at 05/18/12 1130  . metoprolol tartrate (LOPRESSOR) tablet 12.5 mg  12.5 mg Oral BID Eda Paschal [place], PA   12.5 mg at 05/18/12 2106  . ondansetron (ZOFRAN) tablet 4 mg  4 mg Oral Q6H PRN Houston Siren, MD       Or  . ondansetron (  ZOFRAN) injection 4 mg  4 mg Intravenous Q6H PRN Houston Siren, MD   4 mg at 05/16/12 2035  . pantoprazole (PROTONIX) EC tablet 40 mg  40 mg Oral Q1200 Alyson Reedy, MD   40 mg at 05/18/12 1136  . polyethylene glycol (MIRALAX / GLYCOLAX) packet 17 g  17 g Oral Daily Alyson Reedy, MD   17 g at 05/18/12 1136  . potassium & sodium phosphates (PHOS-NAK) 280-160-250 MG packet 2 packet  2 packet Oral TID AC & HS Alyson Reedy, MD   2 packet at 05/18/12 2109  . zolpidem (AMBIEN) tablet 5 mg  5 mg Oral QHS PRN Alyson Reedy, MD   5 mg at 05/19/12 0051  . DISCONTD: amiodarone (NEXTERONE PREMIX) 360 mg/200 mL dextrose IV infusion  0.5 mg/min Intravenous Continuous Mihai Croitoru, MD 16.7 mL/hr at 05/18/12 0700 0.501 mg/min at 05/18/12 0700  . DISCONTD: phenylephrine (NEO-SYNEPHRINE) 40,000 mcg in dextrose 5 % 250 mL infusion  30-200 mcg/min Intravenous PRN Nelda Bucks, MD   5 mcg/min at 05/17/12 1004  . DISCONTD: sodium chloride 0.9 % bolus 500 mL  500 mL Intravenous Q30 min PRN Simonne Martinet, NP        PE: General appearance: alert, cooperative and no distress Lungs: Decreased BS on the right base.  No wheeze or rales. Heart: regular rate and rhythm and 2/6 sys MM LSB Extremities: 3+ right LEE.  1+ left LEE Pulses: Raidals 2+ and symmetric 2+ right DP, 1+ left DP. Skin: Difuse erythema, Lower extremities warm.   Lab Results:   Basename 05/19/12 0430 05/18/12 0300 05/17/12 0519  WBC 5.5 4.8 7.7  HGB 9.6* 9.5* 11.3*  HCT 28.8* 28.9* 33.2*  PLT 179 169 175   BMET  Basename 05/19/12 0430 05/18/12 0300 05/17/12 2030  NA 142 133* 131*  K 4.3 4.5 4.7  CL 106 104 101  CO2 23 22 21   GLUCOSE 86 133* 178*  BUN 17 19 20   CREATININE 0.96 1.03 1.12*  CALCIUM 8.5 8.1* 7.6*   PT/INR No results found for this basename: LABPROT:3,INR:3 in the last 72 hours Cholesterol No results found for this basename: CHOL in the last 72 hours Cardiac Enzymes No components found with this basename: TROPONIN:3, CKMB:3  Studies/Results: 05/18/12, PORTABLE CHEST - 1 VIEW  Comparison: Chest x-ray 05/17/2012.  Findings: There is a right-sided internal jugular central venous  catheter with tip terminating in the mid superior vena cava. Lung  volumes are low. There is a persistent left basilar opacity,  favored to represent an area of atelectasis, however, underlying  airspace consolidation from infection or aspiration is difficult to  exclude. Improving aeration at the right base suggests decreasing  atelectasis. Blunting of the left costophrenic sulcus, compatible  with a small left-sided pleural effusion (similar to prior). Mild  pulmonary venous congestion without frank pulmonary edema. Heart  size is borderline enlarged. Mediastinal contours are  unremarkable. Atherosclerotic calcifications within the arch of  the aorta.  IMPRESSION:  1. Slight  improving bibasilar aeration with persistent left lower  lobe subsegmental atelectasis and/or consolidation.  2. Small left-sided pleural effusion.  3. Atherosclerosis.  Assessment/Plan    Principal Problem:  *Cellulitis in diabetic foot Active Problems:  DM (diabetes mellitus)  HTN (hypertension)  Hypercholesterolemia  Afib, paroxysmal, with RVR at times with BBB  Anemia  Diastolic dysfunction, left ventricle  Acute kidney failure  Allergic drug rash  Stevens-Johnson syndrome  Septic shock  Plan:  Maintaining  NSR in the mid 90's.  IV heparin. PO amiodarone as of yesterday.  Titrate up lopressor to 25 MG bid.  ~5 liters diuresed in the last two days and a lot more to go.   LOS: 7 days    Pamela [name] 05/19/2012 7:45 AM

## 2012-05-20 DIAGNOSIS — R21 Rash and other nonspecific skin eruption: Secondary | ICD-10-CM | POA: Diagnosis not present

## 2012-05-20 DIAGNOSIS — L03119 Cellulitis of unspecified part of limb: Secondary | ICD-10-CM

## 2012-05-20 DIAGNOSIS — E1165 Type 2 diabetes mellitus with hyperglycemia: Secondary | ICD-10-CM

## 2012-05-20 DIAGNOSIS — L02419 Cutaneous abscess of limb, unspecified: Secondary | ICD-10-CM

## 2012-05-20 DIAGNOSIS — E8779 Other fluid overload: Secondary | ICD-10-CM

## 2012-05-20 DIAGNOSIS — E8770 Fluid overload, unspecified: Secondary | ICD-10-CM

## 2012-05-20 HISTORY — DX: Fluid overload, unspecified: E87.70

## 2012-05-20 LAB — CBC
MCH: 29.7 pg (ref 26.0–34.0)
MCV: 89.5 fL (ref 78.0–100.0)
Platelets: 212 10*3/uL (ref 150–400)
RDW: 15.1 % (ref 11.5–15.5)

## 2012-05-20 LAB — GLUCOSE, CAPILLARY
Glucose-Capillary: 124 mg/dL — ABNORMAL HIGH (ref 70–99)
Glucose-Capillary: 250 mg/dL — ABNORMAL HIGH (ref 70–99)
Glucose-Capillary: 117 mg/dL — ABNORMAL HIGH (ref 70–99)
Glucose-Capillary: 147 mg/dL — ABNORMAL HIGH (ref 70–99)

## 2012-05-20 LAB — MAGNESIUM: Magnesium: 1.6 mg/dL (ref 1.5–2.5)

## 2012-05-20 LAB — BASIC METABOLIC PANEL
Calcium: 9 mg/dL (ref 8.4–10.5)
Creatinine, Ser: 0.95 mg/dL (ref 0.50–1.10)
GFR calc Af Amer: 66 mL/min — ABNORMAL LOW (ref >90)

## 2012-05-20 MED ORDER — FUROSEMIDE 10 MG/ML IJ SOLN
40.0000 mg | Freq: Two times a day (BID) | INTRAMUSCULAR | Status: AC
  Administered 2012-05-20 (×2): 40 mg via INTRAVENOUS
  Filled 2012-05-20 (×2): qty 4

## 2012-05-20 MED ORDER — LINEZOLID 600 MG PO TABS
600.0000 mg | ORAL_TABLET | Freq: Two times a day (BID) | ORAL | Status: DC
  Administered 2012-05-20 – 2012-05-23 (×7): 600 mg via ORAL
  Filled 2012-05-20 (×8): qty 1

## 2012-05-20 MED ORDER — METOPROLOL TARTRATE 50 MG PO TABS
50.0000 mg | ORAL_TABLET | Freq: Two times a day (BID) | ORAL | Status: DC
  Administered 2012-05-20 – 2012-05-23 (×6): 50 mg via ORAL
  Filled 2012-05-20 (×7): qty 1

## 2012-05-20 NOTE — Progress Notes (Signed)
Subjective: Complain while sitting on side of bed of very sore bottom and being weak. Helped to rest back in bed.    Objective: Vital signs in last 24 hours: Temp:  [97.4 F (36.3 C)-98.3 F (36.8 C)] 98 F (36.7 C) (06/21 0422) Pulse Rate:  [70-130] 130  (06/21 0818) Resp:  [18-23] 20  (06/21 0422) BP: (137-164)/(57-74) 137/68 mmHg (06/21 0818) SpO2:  [96 %-99 %] 97 % (06/21 0422) Weight:  [85.9 kg (189 lb 6 oz)-86.8 kg (191 lb 5.8 oz)] 85.9 kg (189 lb 6 oz) (06/21 0422) Weight change:  Last BM Date: 05/19/12 Intake/Output from previous day: -2808 06/20 0701 - 06/21 0700 In: 1998.7 [P.O.:640; I.V.:548.7; IV Piggyback:810] Out: 4950 [Urine:4950] Intake/Output this shift: Total I/O In: -  Out: 300 [Urine:300]  PE: General:alert and oriented, pleasant affect  Heart:S1S2. RRR, no murmur Lungs:Clear without rales, rhonchi or wheezes Abd:+ BS, soft, non tender WUJ:[id number] reddish Rt. Leg with cellulitis but improved.  Continues with mild rash, improved pruritis    Lab Results:  Basename 05/20/12 0610 05/19/12 0430  WBC 7.0 5.5  HGB 10.2* 9.6*  HCT 30.7* 28.8*  PLT 212 179   BMET  Basename 05/20/12 0610 05/19/12 0430  NA 141 142  K 3.7 4.3  CL 103 106  CO2 29 23  GLUCOSE 97 86  BUN 15 17  CREATININE 0.95 0.96  CALCIUM 9.0 8.5   No results found for this basename: TROPONINI:2,CK,MB:2 in the last 72 hours  No results found for this basename: CHOL, HDL, LDLCALC, LDLDIRECT, TRIG, CHOLHDL   Lab Results  Component Value Date   HGBA1C 7.6* 05/13/2012     Lab Results  Component Value Date   TSH 2.546 05/19/2012    Hepatic Function Panel No results found for this basename: PROT,ALBUMIN,AST,ALT,ALKPHOS,BILITOT,BILIDIR,IBILI in the last 72 hours No results found for this basename: CHOL in the last 72 hours No results found for this basename: PROTIME in the last 72 hours    EKG: Orders placed during the hospital encounter of 05/12/12  . EKG 12-LEAD  . EKG  12-LEAD  . EKG 12-LEAD  . EKG 12-LEAD  . EKG 12-LEAD  . EKG 12-LEAD  . EKG 12-LEAD  . EKG 12-LEAD  . EKG  . EKG 12-LEAD  . EKG 12-LEAD  . EKG 12-LEAD  . EKG 12-LEAD    Studies/Results: No results found.  Medications: I have reviewed the patient's current medications.    Marland Kitchen amiodarone  200 mg Oral Daily  . amiodarone  400 mg Oral BID  . amiodarone  400 mg Oral Daily  . apixaban  5 mg Oral BID  . atorvastatin  40 mg Oral q1800  . calcium carbonate  1,000 mg of elemental calcium Oral TID  . furosemide  40 mg Intravenous Q8H  . furosemide  40 mg Intravenous BID  . insulin aspart  0-20 Units Subcutaneous Q4H  . insulin aspart  4 Units Subcutaneous TID WC  . insulin glargine  8 Units Subcutaneous QHS  . linezolid  600 mg Oral Q12H  . magnesium sulfate 1 - 4 g bolus IVPB  2 g Intravenous Once  . metoprolol tartrate  25 mg Oral BID  . pantoprazole  40 mg Oral Q1200  . polyethylene glycol  17 g Oral Daily  . DISCONTD: aztreonam  1 g Intravenous Q8H  . DISCONTD: linezolid  600 mg Intravenous Q12H   Assessment/Plan: Patient Active Problem List  Diagnosis  . Cellulitis in diabetic foot  . DM (  diabetes mellitus)  . HTN (hypertension)  . Hypercholesterolemia  . Afib, paroxysmal, with RVR at times with BBB  . Anemia  . Diastolic dysfunction, left ventricle  . Acute kidney failure  . Allergic drug rash  . Stevens-Johnson syndrome  . Skin rash   PLAN:  Now with tachycardia, PAF with intermittent BBB, will increase Lopressor.  Pt not aware of tachycardia, and was not with initial episode.  Plan Nuc study once recovered from acute illness.  On anticoagulation.  LOS: 8 days   INGOLD,LAURA R 05/20/2012, 9:14 AM   Agree with note written by Nada Boozer RNP  PAF currently on NSR. Getting Eliquis. On ATBX and Amio. Nl LV Fxn. Rash appears to be improving. Still has 2+ BLEE. Exam otherwise benign. No other recommendations. Will continue to follow. She did have breaf through PAF  this AM.   Runell Gess 05/20/2012 9:48 AM

## 2012-05-20 NOTE — Progress Notes (Signed)
PCP: Veneda Melter, MD  Brief HPI:  Pamela [name] is an 78 y.o. female with history of diabetes, hypertension, with right foot ulcer managed by the podiatrist, presented to the emergency room with increased redness and swelling. She has no fever or chills, headache, nausea, vomiting or any other symptomology. Evaluation in emergency room included a normal white count of 5.5 thousand, hemoglobin of 11 g per decaliter, blood sugar of 249 with normal renal function tests. Plain film of the distal tibia fibula revealed no osseous abnormality. In the emergency room, she developed atrial fibrillation with rapid ventricular rate of 150. She stated she has intermittently felt this way before. She never had any stroke or TIA symptomology. No history of congestive heart failure. She has no history of bleeding ulcer, easy bruisability, or frequent falls.  Patient subsequently developed a skin rash. Her heart rate did not respond to oral medications. She became more lethargic. She started spiking fevers up to 103F. Subsequently, she was transferred to the intensive care unit. She had fluctuations in her blood pressure as well. Cardiology was consulted and she was started on amiodarone infusion.. Infectious disease was consulted because of the fevers, cellulitis, as well as a rash. 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 She was also seen by nephrology for acute renal failure. All these issues started subsiding. Subsequently, she was transferred back to the floor.  Past medical history:  Past Medical History  Diagnosis Date  . Diabetes mellitus   . Hypertension   . Dysrhythmia     Consultants: [facility], ID. CCM, Dermatology and Nephrology has signed off.  Procedures: Central line placement (Right IJ), Foley  Subjective: Patient feels fine. Admits to overall generalized weakness. No pain. Rash is improving. No CP or shortness of  breath.  Objective: Vital signs in last 24 hours: Temp:  [97.4 F (36.3 C)-98.3 F (36.8 C)] 98 F (36.7 C) (06/21 0422) Pulse Rate:  [70-89] 77  (06/21 0422) Resp:  [18-23] 20  (06/21 0422) BP: (142-164)/(57-74) 146/57 mmHg (06/21 0422) SpO2:  [96 %-99 %] 97 % (06/21 0422) Weight:  [85.9 kg (189 lb 6 oz)-86.8 kg (191 lb 5.8 oz)] 85.9 kg (189 lb 6 oz) (06/21 0422) Weight change:  Last BM Date: 05/19/12  Intake/Output from previous day: 06/20 0701 - 06/21 0700 In: 4798.7 [P.O.:640; I.V.:548.7; IV Piggyback:810] Out: 2150 [Urine:2150] Intake/Output this shift:    General appearance: alert, cooperative, appears stated age, no distress and mildly obese Head: Normocephalic, without obvious abnormality, atraumatic Resp: Decreased air entry at the bases. No crackles. No wheezing. Cardio: regular rate and rhythm, S1, S2 normal, no murmur, click, rub or gallop GI: soft, non-tender; bowel sounds normal; no masses,  no organomegaly Extremities: The extremities show edema. Slightly more swollen on the right side. The right leg is more erythematous compared to the left. Pulses: Are poorly palpable in the dorsalis pedis bilaterally. Skin: An erythematous, macular rash is noted over her upper extremities. According to the patient this is improving. Neurologic: Alert and oriented x3. Has generalized weakness without any focal deficits. Cranial nerves are intact.  Lab Results:  Basename 05/19/12 0430 05/18/12 0300  WBC 5.5 4.8  HGB 9.6* 9.5*  HCT 28.8* 28.9*  PLT 179 169   BMET  Basename 05/19/12 0430 05/18/12 0300  NA 142 133*  K 4.3 4.5  CL 106 104  CO2 23 22  GLUCOSE 86 133*  BUN 17  19  CREATININE 0.96 1.03  CALCIUM 8.5 8.1*  ALT -- --    Studies/Results: No results found.  Medications:  Scheduled:   . amiodarone  200 mg Oral Daily  . amiodarone  400 mg Oral BID  . amiodarone  400 mg Oral Daily  . apixaban  5 mg Oral BID  . atorvastatin  40 mg Oral q1800  . calcium  carbonate  1,000 mg of elemental calcium Oral TID  . furosemide  40 mg Intravenous Q8H  . furosemide  40 mg Intravenous BID  . insulin aspart  0-20 Units Subcutaneous Q4H  . insulin aspart  4 Units Subcutaneous TID WC  . insulin glargine  8 Units Subcutaneous QHS  . linezolid  600 mg Oral Q12H  . magnesium sulfate 1 - 4 g bolus IVPB  2 g Intravenous Once  . metoprolol tartrate  25 mg Oral BID  . pantoprazole  40 mg Oral Q1200  . polyethylene glycol  17 g Oral Daily  . DISCONTD: aztreonam  1 g Intravenous Q8H  . DISCONTD: linezolid  600 mg Intravenous Q12H   Continuous:   . sodium chloride 10 mL/hr at 05/15/12 1900  . sodium chloride 20 mL/hr at 05/16/12 0700  . DISCONTD: heparin 1,300 Units/hr (05/19/12 0140)   ZOX:WRUEAV chloride, acetaminophen, camphor-menthol, diphenhydrAMINE, ondansetron (ZOFRAN) IV, ondansetron, zolpidem  Assessment/Plan:  Principal Problem:  *Cellulitis in diabetic foot Active Problems:  DM (diabetes mellitus)  HTN (hypertension)  Hypercholesterolemia  Afib, paroxysmal, with RVR at times with BBB  Anemia  Diastolic dysfunction, left ventricle  Acute kidney failure  Skin rash   Please note that the patient was transferred out of the step down unit to the floor yesterday.  Cellulitis of the right lower extremity  Patient has been on multiple antibiotics over the course of this hospitalization including vancomycin, Zosyn, clindamycin, cipro, linezolid  and aztreonam. Infectious disease has been following the patient. Yesterday's note recommended that aztreonam can be discontinued and Linezolid can be changed over to oral. We will institute these changes today. Patient remains afebrile. Blood cultures all been negative.  Atrial fibrillation with periodic RVR's Patient required intravenous rate limiting drugs initially. Subsequently, she was given amiodarone infusion. She is converted to sinus rhythm. She is on amiodarone orally along with metoprolol. She  had an episode of nonsustained VT last night. Electrolytes will be checked this morning. Cardiology has been following her. Echocardiogram has been done as well. Diastolic dysfunction has been noted with normal systolic function. She is on oral anticoagulation.  Fluid overload. She received a lot of IV fluids in the ICU. She has been given IV Lasix for diuresis. She is diuresing quite well. We'll give her 2 more doses today. And reevaluate for more intravenous Lasix in the morning. I/O has been checked and they were erroneously reported yesterday.  Anemia. This is stable. Continue to monitor.  Acute renal failure This has resolved.  Diabetes, type II. Continue with Lantus insulin and sliding scale.  Skin rash. Etiology for this remains unclear, but is thought to be secondary to sepsis. Rash is improving. Continue with topical steroids.  Code Status She is a full code   DVT Prophylaxis Is on oral anticoagulation   Disposition Patient will likely stay through the weekend in the hospital.    LOS: 8 days   [facility]  [facility] Pager [phone] 05/20/2012, 8:15 AM

## 2012-05-20 NOTE — Evaluation (Signed)
Occupational Therapy Evaluation Patient Details Name: Pamela [name] MRN: [id number] DOB: 03-30-1934 Today's Date: 05/20/2012 Time: 2841-3244 OT Time Calculation (min): 27 min  OT Assessment / Plan / Recommendation Clinical Impression  Pt is a 78 yo female who presents with LE cellulitis, DM and a fib. Pt with limited activity tolerance and is deconditioned. Skilled OT indicated to maximize independence with BADLs to min A/supervision level for safe d/c home with HHOT and 24/7 A    OT Assessment  Patient needs continued OT Services    Follow Up Recommendations  Home health OT;Supervision/Assistance - 24 hour    Barriers to Discharge      Equipment Recommendations  3 in 1 bedside comode    Recommendations for Other Services    Frequency  Min 2X/week    Precautions / Restrictions Precautions Precautions: Fall   Pertinent Vitals/Pain No pain reported. Reported mild dizziness but vitals all stable.    ADL  Grooming: Simulated;Set up Where Assessed - Grooming: Unsupported sitting Upper Body Bathing: Simulated;Set up Where Assessed - Upper Body Bathing: Unsupported sitting Lower Body Bathing: Simulated;Maximal assistance Where Assessed - Lower Body Bathing: Supported sit to stand Upper Body Dressing: Simulated;Set up Where Assessed - Upper Body Dressing: Unsupported sitting Lower Body Dressing: Simulated;Maximal assistance Where Assessed - Lower Body Dressing: Sopported sit to stand Toilet Transfer: Simulated;Minimal assistance Toilet Transfer Method: Stand pivot Toilet Transfer Equipment: Bedside commode Toileting - Clothing Manipulation and Hygiene: Performed;Maximal assistance Where Assessed - Engineer, mining and Hygiene: Sit to stand from 3-in-1 or toilet Equipment Used: Rolling walker ADL Comments: Pt fatigues quickly and requires max time and effort for all functional tasks.    OT Diagnosis: Generalized weakness  OT Problem List: Decreased activity  tolerance;Decreased knowledge of use of DME or AE;Increased edema OT Treatment Interventions: Self-care/ADL training;Therapeutic activities;DME and/or AE instruction;Patient/family education   OT Goals Acute Rehab OT Goals OT Goal Formulation: With patient Time For Goal Achievement: 06/03/12 Potential to Achieve Goals: Good ADL Goals Pt Will Perform Grooming: with supervision;Standing at sink (x 3 tasks to improve standing actvity tolerance.) ADL Goal: Grooming - Progress: Goal set today Pt Will Perform Lower Body Bathing: with supervision;Sit to stand from chair;Sit to stand from bed ADL Goal: Lower Body Bathing - Progress: Goal set today Pt Will Perform Lower Body Dressing: with supervision;Sit to stand from bed;Sit to stand from chair ADL Goal: Lower Body Dressing - Progress: Goal set today Pt Will Transfer to Toilet: with supervision;3-in-1;Comfort height toilet;Ambulation ADL Goal: Toilet Transfer - Progress: Goal set today Pt Will Perform Toileting - Clothing Manipulation: with supervision;Standing ADL Goal: Toileting - Clothing Manipulation - Progress: Goal set today Pt Will Perform Toileting - Hygiene: with supervision;Sit to stand from 3-in-1/toilet ADL Goal: Toileting - Hygiene - Progress: Goal set today  Visit Information  Last OT Received On: 05/20/12 Assistance Needed: +1    Subjective Data  Subjective: My bottom is so sore. Patient Stated Goal: Get stronger   Prior Functioning  Home Living Lives With: Spouse Available Help at Discharge: Family;Available 24 hours/day Type of Home: House Home Access: Stairs to enter [employer] of Steps: 1+1 Entrance Stairs-Rails: None Home Layout: One level Bathroom Shower/Tub:  (pt spongebathes) Bathroom Toilet: Handicapped height How Accessible: Accessible via walker Home Adaptive Equipment: Grab bars around toilet;Walker - rolling Prior Function Level of Independence: Independent Driving: Yes Vocation:  Retired Musician: No difficulties Dominant Hand: Right    Cognition  Overall Cognitive Status: Appears within functional limits for tasks  assessed/performed Arousal/Alertness: Awake/alert Orientation Level: Appears intact for tasks assessed Behavior During Session: [facility] for tasks performed    Extremity/Trunk Assessment Right Upper Extremity Assessment RUE ROM/Strength/Tone: Within functional levels Left Upper Extremity Assessment LUE ROM/Strength/Tone: Within functional levels   Mobility Bed Mobility Rolling Right: 4: Min guard Right Sidelying to Sit: 4: Min assist Sitting - Scoot to Edge of Bed: 5: Supervision Details for Bed Mobility Assistance: Increased time and min VCs for sequencing and hand placement. Transfers Sit to Stand: 4: Min assist;With upper extremity assist;From bed;From chair/3-in-1;With armrests Stand to Sit: 4: Min guard;With upper extremity assist;With armrests;To bed;To chair/3-in-1 Details for Transfer Assistance: Min VCs for hand placement and technique.   Exercise    Balance Balance Balance Assessed: Yes Static Sitting Balance Static Sitting - Balance Support: No upper extremity supported;Feet supported Static Sitting - Level of Assistance: 5: Stand by assistance Static Standing Balance Static Standing - Balance Support: Bilateral upper extremity supported;During functional activity Static Standing - Level of Assistance: 4: Min assist  End of Session OT - End of Session Activity Tolerance: Patient limited by fatigue Patient left: in bed;with call bell/phone within reach   Octavia Velador A OTR/L [phone] 05/20/2012, 3:09 PM

## 2012-05-20 NOTE — Progress Notes (Addendum)
Pt HR went up to 142, BP-138/68, pt asymptomatic on the side of bed eating breakfast.  HR is now jumping from 112-133.  Dr. Rito Ehrlich was notified and ordered to give 1000 dose of metoprolol now and to notify cardiologist.  Will carry out orders and continue to monitor.

## 2012-05-21 DIAGNOSIS — E1165 Type 2 diabetes mellitus with hyperglycemia: Secondary | ICD-10-CM

## 2012-05-21 DIAGNOSIS — R21 Rash and other nonspecific skin eruption: Secondary | ICD-10-CM

## 2012-05-21 DIAGNOSIS — E8779 Other fluid overload: Secondary | ICD-10-CM

## 2012-05-21 DIAGNOSIS — L03119 Cellulitis of unspecified part of limb: Secondary | ICD-10-CM

## 2012-05-21 DIAGNOSIS — L02419 Cutaneous abscess of limb, unspecified: Secondary | ICD-10-CM

## 2012-05-21 LAB — CULTURE, BLOOD (ROUTINE X 2)
Culture  Setup Time: 201306161657
Culture: NO GROWTH

## 2012-05-21 LAB — GLUCOSE, CAPILLARY
Glucose-Capillary: 188 mg/dL — ABNORMAL HIGH (ref 70–99)
Glucose-Capillary: 123 mg/dL — ABNORMAL HIGH (ref 70–99)
Glucose-Capillary: 181 mg/dL — ABNORMAL HIGH (ref 70–99)
Glucose-Capillary: 160 mg/dL — ABNORMAL HIGH (ref 70–99)
Glucose-Capillary: 153 mg/dL — ABNORMAL HIGH (ref 70–99)

## 2012-05-21 LAB — BASIC METABOLIC PANEL
BUN: 17 mg/dL (ref 6–23)
CO2: 29 mEq/L (ref 19–32)
Calcium: 9.1 mg/dL (ref 8.4–10.5)
Chloride: 101 mEq/L (ref 96–112)
Creatinine, Ser: 1.04 mg/dL (ref 0.50–1.10)

## 2012-05-21 MED ORDER — POLYETHYLENE GLYCOL 3350 17 G PO PACK
17.0000 g | PACK | Freq: Every day | ORAL | Status: DC | PRN
  Filled 2012-05-21: qty 1

## 2012-05-21 MED ORDER — METOCLOPRAMIDE HCL 5 MG PO TABS
5.0000 mg | ORAL_TABLET | Freq: Three times a day (TID) | ORAL | Status: DC
  Administered 2012-05-21 – 2012-05-23 (×8): 5 mg via ORAL
  Filled 2012-05-21 (×14): qty 1

## 2012-05-21 MED ORDER — AMITRIPTYLINE HCL 75 MG PO TABS: 75.0000 mg | ORAL_TABLET | Freq: Every day | ORAL | Status: DC

## 2012-05-21 MED ORDER — INSULIN ASPART 100 UNIT/ML ~~LOC~~ SOLN: 0.0000 [IU] | Freq: Every day | SUBCUTANEOUS | Status: DC

## 2012-05-21 MED ORDER — INSULIN ASPART 100 UNIT/ML ~~LOC~~ SOLN
0.0000 [IU] | Freq: Three times a day (TID) | SUBCUTANEOUS | Status: DC
  Administered 2012-05-21 – 2012-05-22 (×4): 3 [IU] via SUBCUTANEOUS
  Administered 2012-05-23: 2 [IU] via SUBCUTANEOUS
  Administered 2012-05-23: 3 [IU] via SUBCUTANEOUS

## 2012-05-21 MED ORDER — LORATADINE 10 MG PO TABS
10.0000 mg | ORAL_TABLET | Freq: Every day | ORAL | Status: DC
  Administered 2012-05-21 – 2012-05-23 (×3): 10 mg via ORAL
  Filled 2012-05-21 (×4): qty 1

## 2012-05-21 MED ORDER — FUROSEMIDE 40 MG PO TABS
40.0000 mg | ORAL_TABLET | Freq: Every day | ORAL | Status: DC
  Administered 2012-05-21 – 2012-05-23 (×3): 40 mg via ORAL
  Filled 2012-05-21 (×3): qty 1

## 2012-05-21 NOTE — Progress Notes (Signed)
Subjective: Short run of A fib during the night some with BBB, now in SR.  She did have a few pauses yesterday at 1.1sec though the one documented was appropriate after a PVC.  Objective: Vital signs in last 24 hours: Temp:  [97.2 F (36.2 C)-97.9 F (36.6 C)] 97.4 F (36.3 C) (06/22 0521) Pulse Rate:  [69-82] 69  (06/22 0521) Resp:  [18-20] 18  (06/22 0521) BP: (139-150)/(51-74) 144/51 mmHg (06/22 0521) SpO2:  [96 %-98 %] 96 % (06/22 0521) Weight:  [85.3 kg (188 lb 0.8 oz)] 85.3 kg (188 lb 0.8 oz) (06/22 0521) Weight change: -1.5 kg (-3 lb 4.9 oz) Last BM Date: 05/20/12 Intake/Output from previous day: -1960 06/21 0701 - 06/22 0700 In: 240 [P.O.:240] Out: 2200 [Urine:2200] Intake/Output this shift:    PE: General:alert and oriented complains of nausea. On bedside commode. Heart:S1S2 RRR Lungs:clear, but diminished in bases. Abd:+ BS soft nontender Ext:no edema    Lab Results:  Basename 05/20/12 0610 05/19/12 0430  WBC 7.0 5.5  HGB 10.2* 9.6*  HCT 30.7* 28.8*  PLT 212 179   BMET  Basename 05/20/12 0610 05/19/12 0430  NA 141 142  K 3.7 4.3  CL 103 106  CO2 29 23  GLUCOSE 97 86  BUN 15 17  CREATININE 0.95 0.96  CALCIUM 9.0 8.5   No results found for this basename: TROPONINI:2,CK,MB:2 in the last 72 hours  No results found for this basename: CHOL, HDL, LDLCALC, LDLDIRECT, TRIG, CHOLHDL   Lab Results  Component Value Date   HGBA1C 7.6* 05/13/2012     Lab Results  Component Value Date   TSH 2.546 05/19/2012    Hepatic Function Panel No results found for this basename: PROT,ALBUMIN,AST,ALT,ALKPHOS,BILITOT,BILIDIR,IBILI in the last 72 hours No results found for this basename: CHOL in the last 72 hours No results found for this basename: PROTIME in the last 72 hours    EKG: Orders placed during the hospital encounter of 05/12/12  . EKG 12-LEAD  . EKG 12-LEAD  . EKG 12-LEAD  . EKG 12-LEAD  . EKG 12-LEAD  . EKG 12-LEAD  . EKG 12-LEAD  . EKG 12-LEAD    . EKG  . EKG 12-LEAD  . EKG 12-LEAD  . EKG 12-LEAD  . EKG 12-LEAD    Studies/Results: No results found.  Medications: I have reviewed the patient's current medications.    Marland Kitchen amiodarone  200 mg Oral Daily  . amiodarone  400 mg Oral BID  . amiodarone  400 mg Oral Daily  . apixaban  5 mg Oral BID  . atorvastatin  40 mg Oral q1800  . calcium carbonate  1,000 mg of elemental calcium Oral TID  . furosemide  40 mg Intravenous BID  . insulin aspart  0-20 Units Subcutaneous Q4H  . insulin aspart  4 Units Subcutaneous TID WC  . insulin glargine  8 Units Subcutaneous QHS  . linezolid  600 mg Oral Q12H  . metoprolol tartrate  50 mg Oral BID  . pantoprazole  40 mg Oral Q1200  . polyethylene glycol  17 g Oral Daily  . DISCONTD: metoprolol tartrate  25 mg Oral BID   Assessment/Plan: Patient Active Problem List  Diagnosis  . Cellulitis in diabetic foot  . DM (diabetes mellitus)  . HTN (hypertension)  . Hypercholesterolemia  . Afib, paroxysmal, with RVR at times with BBB  . Anemia  . Diastolic dysfunction, left ventricle  . Acute kidney failure  . Allergic drug rash  . Stevens-Johnson syndrome  .  Skin rash  . Fluid overload   PLAN: Metoprolol increased yesterday to 50 mg BID, BP stable but still with PAF with rate dependent BBB and occ pause after PVC.  LOS: 9 days   Mataio Mele R 05/21/2012, 8:29 AM

## 2012-05-21 NOTE — Progress Notes (Signed)
PCP: Veneda Melter, MD  Brief HPI:  Pamela [name] is an 78 y.o. female with history of diabetes, hypertension, with right foot ulcer managed by the podiatrist, presented to the emergency room with increased redness and swelling. She has no fever or chills, headache, nausea, vomiting or any other symptomology. Evaluation in emergency room included a normal white count of 5.5 thousand, hemoglobin of 11 g per decaliter, blood sugar of 249 with normal renal function tests. Plain film of the distal tibia fibula revealed no osseous abnormality. In the emergency room, she developed atrial fibrillation with rapid ventricular rate of 150. She stated she has intermittently felt this way before. She never had any stroke or TIA symptomology. No history of congestive heart failure. She has no history of bleeding ulcer, easy bruisability, or frequent falls.  Patient subsequently developed a skin rash. Her heart rate did not respond to oral medications. She became more lethargic. She started spiking fevers up to 103F. Subsequently, she was transferred to the intensive care unit. She had fluctuations in her blood pressure as well. Cardiology was consulted and she was started on amiodarone infusion.. Infectious disease was consulted because of the fevers, cellulitis, as well as a rash. 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 She was also seen by nephrology for acute renal failure. All these issues started subsiding. Subsequently, she was transferred back to the floor.  Past medical history:  Past Medical History  Diagnosis Date  . Diabetes mellitus   . Hypertension   . Dysrhythmia     Consultants: [facility], ID. CCM, Dermatology and Nephrology has signed off.  Procedures: Central line placement (Right IJ), Foley  Subjective: Patient feels fine overall. Has some nausea but no vomiting. Rash is better. Having multiple loose stools and  requesting that we stop Miralax. No CP or shortness of breath. Mentions some pain in neck due to the way she slept.  Objective: Vital signs in last 24 hours: Temp:  [97.2 F (36.2 C)-97.9 F (36.6 C)] 97.4 F (36.3 C) (06/22 0521) Pulse Rate:  [69-82] 69  (06/22 0521) Resp:  [18-20] 18  (06/22 0521) BP: (139-150)/(51-74) 144/51 mmHg (06/22 0521) SpO2:  [96 %-98 %] 96 % (06/22 0521) Weight:  [85.3 kg (188 lb 0.8 oz)] 85.3 kg (188 lb 0.8 oz) (06/22 0521) Weight change: -1.5 kg (-3 lb 4.9 oz) Last BM Date: 05/21/12  Intake/Output from previous day: 06/21 0701 - 06/22 0700 In: 240 [P.O.:240] Out: 2200 [Urine:2200] Intake/Output this shift: Total I/O In: -  Out: 200 [Urine:200]  General appearance: alert, cooperative, appears stated age, no distress Head: Normocephalic, without obvious abnormality, atraumatic Resp: Decreased air entry at the bases. No crackles. No wheezing. Cardio: regular rate and rhythm, S1, S2 normal, no murmur, click, rub or gallop GI: soft, non-tender; bowel sounds normal; no masses,  no organomegaly Extremities: The extremities show edema which is slightly better. Slightly more swollen on the right side. The right leg is more erythematous compared to the left. Pulses: Are poorly palpable in the dorsalis pedis bilaterally. Skin: An erythematous, macular rash is noted over her upper extremities. According to the patient this is improving. Neurologic: Alert and oriented x3. Has generalized weakness without any focal deficits. Cranial nerves are intact.  Lab Results:  Basename 05/20/12 0610 05/19/12 0430  WBC 7.0 5.5  HGB 10.2* 9.6*  HCT 30.7* 28.8*  PLT 212 179   BMET  Basename 05/21/12 0743  05/20/12 0610  NA 141 141  K 4.0 3.7  CL 101 103  CO2 29 29  GLUCOSE 127* 97  BUN 17 15  CREATININE 1.04 0.95  CALCIUM 9.1 9.0  ALT -- --    Studies/Results: No results found.  Medications:  Scheduled:    . amiodarone  200 mg Oral Daily  . amiodarone   400 mg Oral BID  . amiodarone  400 mg Oral Daily  . amitriptyline  75 mg Oral QHS  . apixaban  5 mg Oral BID  . atorvastatin  40 mg Oral q1800  . calcium carbonate  1,000 mg of elemental calcium Oral TID  . furosemide  40 mg Intravenous BID  . insulin aspart  0-20 Units Subcutaneous Q4H  . insulin aspart  4 Units Subcutaneous TID WC  . insulin glargine  8 Units Subcutaneous QHS  . linezolid  600 mg Oral Q12H  . metoprolol tartrate  50 mg Oral BID  . pantoprazole  40 mg Oral Q1200  . polyethylene glycol  17 g Oral Daily  . DISCONTD: metoprolol tartrate  25 mg Oral BID   Continuous:    . sodium chloride 10 mL/hr at 05/15/12 1900  . sodium chloride 20 mL/hr at 05/16/12 0700   ZOX:WRUEAV chloride, acetaminophen, camphor-menthol, diphenhydrAMINE, ondansetron (ZOFRAN) IV, ondansetron, zolpidem  Assessment/Plan:  Principal Problem:  *Cellulitis in diabetic foot Active Problems:  DM (diabetes mellitus)  HTN (hypertension)  Hypercholesterolemia  Afib, paroxysmal, with RVR at times with BBB  Anemia  Diastolic dysfunction, left ventricle  Acute kidney failure  Skin rash  Fluid overload    Cellulitis of the right lower extremity  Patient has been on multiple antibiotics over the course of this hospitalization including vancomycin, Zosyn, clindamycin, cipro, linezolid  and aztreonam. Infectious disease has been following the patient. Currently patient is only on oral Linezolid. Patient remains afebrile. Blood cultures all been negative. Doppler negative for DVT.  Atrial fibrillation with periodic RVR's Patient required intravenous rate limiting drugs initially. Subsequently, she was given amiodarone infusion. She is converted to sinus rhythm. She is on amiodarone orally along with metoprolol. Still with periods of afib. Cardiology has been following her. Please see their note. Echocardiogram has been done as well. Diastolic dysfunction has been noted with normal systolic function.  She is on oral anticoagulation.  Fluid overload. She received a lot of IV fluids in the ICU. She has been given IV Lasix for diuresis. She is diuresing quite well. Start oral Lasix.  Anemia. This remains stable.  Acute renal failure This has resolved.  Diabetes, type II. Stable. CBG's well controlled. Continue with Lantus insulin and sliding scale.   Nausea Will start Reglan for nausea as she could have diabetic gastroparesis.  Skin rash. Etiology for this remains unclear, but is thought to be secondary to sepsis. Rash is improving. Continue with topical steroids.  Code Status She is a full code   DVT Prophylaxis Is on oral anticoagulation   Disposition Patient will likely stay through the weekend in the hospital. Needs home health. Is much improved. Anticipate discharge sometime this week.   LOS: 9 days   [facility]  [facility] Pager [phone] 05/21/2012, 9:20 AM

## 2012-05-21 NOTE — Progress Notes (Addendum)
Pt. Seen and examined. Agree with the NP/PA-C note as written. Short runs of p-afib. Continues to load on amiodarone and on Eliquis for anticoagulation.  Rash is improving. She does appears somewhat down.  She reports being on amitryptiline at night for 20 years. Consider restarting this on discharge. Contraindicated with linezolid.  Chrystie Nose, MD, [facility] Attending Cardiologist [facility]

## 2012-05-22 DIAGNOSIS — E8771 Transfusion associated circulatory overload: Secondary | ICD-10-CM

## 2012-05-22 DIAGNOSIS — E1165 Type 2 diabetes mellitus with hyperglycemia: Secondary | ICD-10-CM

## 2012-05-22 DIAGNOSIS — L02419 Cutaneous abscess of limb, unspecified: Secondary | ICD-10-CM

## 2012-05-22 DIAGNOSIS — L03119 Cellulitis of unspecified part of limb: Secondary | ICD-10-CM

## 2012-05-22 DIAGNOSIS — R21 Rash and other nonspecific skin eruption: Secondary | ICD-10-CM

## 2012-05-22 LAB — GLUCOSE, CAPILLARY
Glucose-Capillary: 175 mg/dL — ABNORMAL HIGH (ref 70–99)
Glucose-Capillary: 88 mg/dL (ref 70–99)

## 2012-05-22 LAB — BASIC METABOLIC PANEL
BUN: 18 mg/dL (ref 6–23)
Calcium: 8.8 mg/dL (ref 8.4–10.5)
Creatinine, Ser: 0.97 mg/dL (ref 0.50–1.10)
GFR calc Af Amer: 64 mL/min — ABNORMAL LOW (ref >90)
GFR calc non Af Amer: 55 mL/min — ABNORMAL LOW (ref >90)

## 2012-05-22 MED ORDER — POTASSIUM CHLORIDE CRYS ER 20 MEQ PO TBCR
20.0000 meq | EXTENDED_RELEASE_TABLET | Freq: Every day | ORAL | Status: DC
  Administered 2012-05-22 – 2012-05-23 (×2): 20 meq via ORAL
  Filled 2012-05-22 (×2): qty 1

## 2012-05-22 MED ORDER — OXYCODONE HCL 5 MG PO TABS
5.0000 mg | ORAL_TABLET | Freq: Four times a day (QID) | ORAL | Status: DC | PRN
  Filled 2012-05-22: qty 1

## 2012-05-22 NOTE — Progress Notes (Signed)
[facility]  DAILY PROGRESS NOTE   Subjective:  No events overnight. Complains of neck and back pain. Rash is almost totally resolved.  Objective:  Temp:  [97.8 F (36.6 C)-99 F (37.2 C)] 97.9 F (36.6 C) (06/23 0439) Pulse Rate:  [64-85] 64  (06/23 0439) Resp:  [14-16] 16  (06/23 0439) BP: (129-185)/(51-76) 145/68 mmHg (06/23 0439) SpO2:  [94 %-98 %] 96 % (06/23 0439) Weight:  [81.8 kg (180 lb 5.4 oz)] 81.8 kg (180 lb 5.4 oz) (06/23 0439) Weight change: -3.5 kg (-7 lb 11.5 oz)  Intake/Output from previous day: 06/22 0701 - 06/23 0700 In: 680 [P.O.:680] Out: 375 [Urine:375]  Intake/Output from this shift: Total I/O In: -  Out: 177 [Urine:176; Stool:1]  Medications: Current Facility-Administered Medications  Medication Dose Route Frequency Provider Last Rate Last Dose  . 0.9 %  sodium chloride infusion   Intravenous Continuous PRN Nelda Bucks, MD 10 mL/hr at 05/15/12 1900    . 0.9 %  sodium chloride infusion   Intravenous Continuous Nelda Bucks, MD 20 mL/hr at 05/16/12 0700    . acetaminophen (TYLENOL) tablet 650 mg  650 mg Oral Q6H PRN Sorin Luanne Bras, MD   650 mg at 05/22/12 1610  . amiodarone (PACERONE) tablet 200 mg  200 mg Oral Daily Eda Paschal [place], [place]      . amiodarone (PACERONE) tablet 400 mg  400 mg Oral BID Eda Paschal [place], [place]   400 mg at 05/21/12 2250  . amiodarone (PACERONE) tablet 400 mg  400 mg Oral Daily Eda Paschal [place], [place]      . apixaban (ELIQUIS) tablet 5 mg  5 mg Oral BID Chrystie Nose, MD   5 mg at 05/21/12 2250  . atorvastatin (LIPITOR) tablet 40 mg  40 mg Oral q1800 Houston Siren, MD   40 mg at 05/21/12 1650  . calcium carbonate (OS-CAL - dosed in mg of elemental calcium) tablet 1,000 mg of elemental calcium  1,000 mg of elemental calcium Oral TID Cecille Aver, MD   1,000 mg of elemental calcium at 05/21/12 2200  . camphor-menthol (SARNA) lotion   Topical PRN Zigmund Gottron, MD      . diphenhydrAMINE  (BENADRYL) injection 12.5 mg  12.5 mg Intravenous Q6H PRN Zigmund Gottron, MD   12.5 mg at 05/21/12 1825  . furosemide (LASIX) tablet 40 mg  40 mg Oral Daily Osvaldo Shipper, MD   40 mg at 05/21/12 1121  . insulin aspart (novoLOG) injection 0-15 Units  0-15 Units Subcutaneous TID WC Osvaldo Shipper, MD   3 Units at 05/22/12 0758  . insulin aspart (novoLOG) injection 0-5 Units  0-5 Units Subcutaneous QHS Osvaldo Shipper, MD      . insulin aspart (novoLOG) injection 4 Units  4 Units Subcutaneous TID WC Houston Siren, MD   4 Units at 05/22/12 0757  . insulin glargine (LANTUS) injection 8 Units  8 Units Subcutaneous QHS Sorin Luanne Bras, MD   8 Units at 05/21/12 2257  . linezolid (ZYVOX) tablet 600 mg  600 mg Oral Q12H Osvaldo Shipper, MD   600 mg at 05/21/12 2250  . loratadine (CLARITIN) tablet 10 mg  10 mg Oral Daily Osvaldo Shipper, MD   10 mg at 05/21/12 1651  . metoCLOPramide (REGLAN) tablet 5 mg  5 mg Oral TID AC & HS Osvaldo Shipper, MD   5 mg at 05/22/12 0618  . metoprolol (LOPRESSOR) tablet 50 mg  50 mg Oral  BID Nada Boozer, NP   50 mg at 05/21/12 2250  . ondansetron (ZOFRAN) tablet 4 mg  4 mg Oral Q6H PRN Houston Siren, MD       Or  . ondansetron [facility]) injection 4 mg  4 mg Intravenous Q6H PRN Houston Siren, MD   4 mg at 05/21/12 0835  . pantoprazole (PROTONIX) EC tablet 40 mg  40 mg Oral Q1200 Alyson Reedy, MD   40 mg at 05/21/12 1304  . polyethylene glycol (MIRALAX / GLYCOLAX) packet 17 g  17 g Oral Daily PRN Osvaldo Shipper, MD      . zolpidem (AMBIEN) tablet 5 mg  5 mg Oral QHS PRN Alyson Reedy, MD   5 mg at 05/19/12 0051  . DISCONTD: insulin aspart (novoLOG) injection 0-20 Units  0-20 Units Subcutaneous Q4H Nelda Bucks, MD   3 Units at 05/21/12 1116    Physical Exam: General appearance: alert and no distress Neck: no adenopathy, no carotid bruit, no JVD, supple, symmetrical, trachea midline and thyroid not enlarged, symmetric, no tenderness/mass/nodules Lungs: clear to auscultation  bilaterally Heart: regular rate and rhythm, S1, S2 normal, no murmur, click, rub or gallop Abdomen: soft, non-tender; bowel sounds normal; no masses,  no organomegaly Extremities: trace rash remains Pulses: 2+ and symmetric  Lab Results: Results for orders placed during the hospital encounter of 05/12/12 (from the past 48 hour(s))  GLUCOSE, CAPILLARY     Status: Abnormal   Collection Time   05/20/12 11:44 AM      Component Value Range Comment   Glucose-Capillary 117 (*) 70 - 99 mg/dL    Comment 1 Notify RN     GLUCOSE, CAPILLARY     Status: Abnormal   Collection Time   05/20/12  3:59 PM      Component Value Range Comment   Glucose-Capillary 147 (*) 70 - 99 mg/dL    Comment 1 Notify RN     GLUCOSE, CAPILLARY     Status: Abnormal   Collection Time   05/20/12  8:54 PM      Component Value Range Comment   Glucose-Capillary 121 (*) 70 - 99 mg/dL    Comment 1 Notify RN     GLUCOSE, CAPILLARY     Status: Abnormal   Collection Time   05/21/12 12:14 AM      Component Value Range Comment   Glucose-Capillary 188 (*) 70 - 99 mg/dL    Comment 1 Notify RN     GLUCOSE, CAPILLARY     Status: Abnormal   Collection Time   05/21/12  4:05 AM      Component Value Range Comment   Glucose-Capillary 142 (*) 70 - 99 mg/dL    Comment 1 Notify RN     BASIC METABOLIC PANEL     Status: Abnormal   Collection Time   05/21/12  7:43 AM      Component Value Range Comment   Sodium 141  135 - 145 mEq/L    Potassium 4.0  3.5 - 5.1 mEq/L    Chloride 101  96 - 112 mEq/L    CO2 29  19 - 32 mEq/L    Glucose, Bld 127 (*) 70 - 99 mg/dL    BUN 17  6 - 23 mg/dL    Creatinine, Ser 1.61  0.50 - 1.10 mg/dL    Calcium 9.1  8.4 - 09.6 mg/dL    GFR calc non Af Amer 51 (*) >90 mL/min    GFR calc  Af Amer 59 (*) >90 mL/min   GLUCOSE, CAPILLARY     Status: Abnormal   Collection Time   05/21/12  7:51 AM      Component Value Range Comment   Glucose-Capillary 123 (*) 70 - 99 mg/dL   GLUCOSE, CAPILLARY     Status: Abnormal     Collection Time   05/21/12 12:05 PM      Component Value Range Comment   Glucose-Capillary 181 (*) 70 - 99 mg/dL   GLUCOSE, CAPILLARY     Status: Abnormal   Collection Time   05/21/12  4:03 PM      Component Value Range Comment   Glucose-Capillary 160 (*) 70 - 99 mg/dL   GLUCOSE, CAPILLARY     Status: Abnormal   Collection Time   05/21/12  9:44 PM      Component Value Range Comment   Glucose-Capillary 153 (*) 70 - 99 mg/dL    Comment 1 Notify RN     BASIC METABOLIC PANEL     Status: Abnormal   Collection Time   05/22/12  6:47 AM      Component Value Range Comment   Sodium 142  135 - 145 mEq/L    Potassium 3.7  3.5 - 5.1 mEq/L    Chloride 104  96 - 112 mEq/L    CO2 27  19 - 32 mEq/L    Glucose, Bld 187 (*) 70 - 99 mg/dL    BUN 18  6 - 23 mg/dL    Creatinine, Ser 1.61  0.50 - 1.10 mg/dL    Calcium 8.8  8.4 - 09.6 mg/dL    GFR calc non Af Amer 55 (*) >90 mL/min    GFR calc Af Amer 64 (*) >90 mL/min   GLUCOSE, CAPILLARY     Status: Abnormal   Collection Time   05/22/12  7:05 AM      Component Value Range Comment   Glucose-Capillary 176 (*) 70 - 99 mg/dL     Imaging: No results found.  Assessment:  1. Principal Problem: 2.  *Cellulitis in diabetic foot 3. Active Problems: 4.  DM (diabetes mellitus) 5.  HTN (hypertension) 6.  Hypercholesterolemia 7.  Afib, paroxysmal, with RVR at times with BBB 8.  Anemia 9.  Diastolic dysfunction, left ventricle 10.  Acute kidney failure 11.  Skin rash 12.  Fluid overload 13.   Plan:  1. Telemetry shows sinus rhythm now with occasional PVC's. 3 beat NSVT of no real concern. Doing well on amiodarone and Eliquis for stroke prophylaxis. No further recommendations at this time. Will arrange follow-up after discharge in our office with Dr. Royann Shivers.  Plan amiodarone taper as ordered.  Will sign-off. Call with questions.  Time Spent Directly with Patient:  15 minutes  Length of Stay:  LOS: 10 days   Chrystie Nose, MD,  [facility] Attending Cardiologist [facility]  Roshell Brigham C 05/22/2012, 9:50 AM

## 2012-05-22 NOTE — Progress Notes (Signed)
PCP: Veneda Melter, MD  Brief HPI:  Pamela [name] is an 78 y.o. female with history of diabetes, hypertension, with right foot ulcer managed by the podiatrist, presented to the emergency room with increased redness and swelling. She has no fever or chills, headache, nausea, vomiting or any other symptomology. Evaluation in emergency room included a normal white count of 5.5 thousand, hemoglobin of 11 g per decaliter, blood sugar of 249 with normal renal function tests. Plain film of the distal tibia fibula revealed no osseous abnormality. In the emergency room, she developed atrial fibrillation with rapid ventricular rate of 150. She stated she has intermittently felt this way before. She never had any stroke or TIA symptomology. No history of congestive heart failure. She has no history of bleeding ulcer, easy bruisability, or frequent falls.  Patient subsequently developed a skin rash. Her heart rate did not respond to oral medications. She became more lethargic. She started spiking fevers up to 103F. Subsequently, she was transferred to the intensive care unit. She had fluctuations in her blood pressure as well. Cardiology was consulted and she was started on amiodarone infusion.. Infectious disease was consulted because of the fevers, cellulitis, as well as a rash. 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 She was also seen by nephrology for acute renal failure. All these issues started subsiding. Subsequently, she was transferred back to the floor.  Past medical history:  Past Medical History  Diagnosis Date  . Diabetes mellitus   . Hypertension   . Dysrhythmia     Consultants: [facility], ID. CCM, Dermatology and Nephrology has signed off.  Procedures: Central line placement (Right IJ), Foley  Subjective: Patient feels fine overall. Complaining of pain in Neck radiating to back. Is a chronic issue for her. No  tingling numbness.   Objective: Vital signs in last 24 hours: Temp:  [97.8 F (36.6 C)-99 F (37.2 C)] 97.9 F (36.6 C) (06/23 0439) Pulse Rate:  [64-85] 64  (06/23 0439) Resp:  [14-16] 16  (06/23 0439) BP: (129-185)/(51-76) 145/68 mmHg (06/23 0439) SpO2:  [94 %-98 %] 96 % (06/23 0439) Weight:  [81.8 kg (180 lb 5.4 oz)] 81.8 kg (180 lb 5.4 oz) (06/23 0439) Weight change: -3.5 kg (-7 lb 11.5 oz) Last BM Date: 05/21/12  Intake/Output from previous day: 06/22 0701 - 06/23 0700 In: 680 [P.O.:680] Out: 375 [Urine:375] Intake/Output this shift: Total I/O In: -  Out: 177 [Urine:176; Stool:1]  General appearance: alert, cooperative, appears stated age, no distress Resp: Decreased air entry at the bases. No crackles. No wheezing. Cardio: regular rate and rhythm, S1, S2 normal, no murmur, click, rub or gallop GI: soft, non-tender; bowel sounds normal; no masses,  no organomegaly Extremities: The extremities show edema which is better. Slightly more swollen on the right side. The right leg is more erythematous compared to the left but is improved as well. Pulses: Are poorly palpable in the dorsalis pedis bilaterally. Skin: An erythematous, macular rash is noted over her upper extremities. According to the patient this is improving. Much improved. Neurologic: Alert and oriented x3. Has generalized weakness without any focal deficits. Cranial nerves are intact.  Lab Results:  [facility] 05/20/12 0610  WBC 7.0  HGB 10.2*  HCT 30.7*  PLT 212   BMET  Basename 05/22/12 0647 05/21/12 0743  NA 142 141  K 3.7 4.0  CL 104 101  CO2 27 29  GLUCOSE 187* 127*  BUN 18 17  CREATININE 0.97 1.04  CALCIUM 8.8 9.1  ALT -- --    Studies/Results: No results found.  Medications:  Scheduled:    . amiodarone  200 mg Oral Daily  . amiodarone  400 mg Oral BID  . amiodarone  400 mg Oral Daily  . apixaban  5 mg Oral BID  . atorvastatin  40 mg Oral q1800  . calcium carbonate  1,000 mg of  elemental calcium Oral TID  . furosemide  40 mg Oral Daily  . insulin aspart  0-15 Units Subcutaneous TID WC  . insulin aspart  0-5 Units Subcutaneous QHS  . insulin aspart  4 Units Subcutaneous TID WC  . insulin glargine  8 Units Subcutaneous QHS  . linezolid  600 mg Oral Q12H  . loratadine  10 mg Oral Daily  . metoCLOPramide  5 mg Oral TID AC & HS  . metoprolol tartrate  50 mg Oral BID  . pantoprazole  40 mg Oral Q1200  . DISCONTD: insulin aspart  0-20 Units Subcutaneous Q4H   Continuous:    . sodium chloride 10 mL/hr at 05/15/12 1900  . sodium chloride 20 mL/hr at 05/16/12 0700   JXB:JYNWGN chloride, acetaminophen, camphor-menthol, diphenhydrAMINE, ondansetron (ZOFRAN) IV, ondansetron, polyethylene glycol, zolpidem  Assessment/Plan:  Principal Problem:  *Cellulitis in diabetic foot Active Problems:  DM (diabetes mellitus)  HTN (hypertension)  Hypercholesterolemia  Afib, paroxysmal, with RVR at times with BBB  Anemia  Diastolic dysfunction, left ventricle  Acute kidney failure  Skin rash  Fluid overload    Cellulitis of the right lower extremity  Patient has been on multiple antibiotics over the course of this hospitalization including vancomycin, Zosyn, clindamycin, cipro, linezolid  and aztreonam. Infectious disease has been following the patient. Currently patient is only on oral Linezolid. Patient remains afebrile. Blood cultures all been negative. Doppler negative for DVT.  Atrial fibrillation with periodic RVR's Patient required intravenous rate limiting drugs initially. Subsequently, she was given amiodarone infusion. She is converted to sinus rhythm. She is on amiodarone orally along with metoprolol. Dose of metoprolol increased on 6/21. Few episodes of PVC's and NSVT.  Cardiology has been following her. Echocardiogram showed normal systolic function with Diastolic dysfunction. She is on oral anticoagulation.  Fluid overload. Improved. Now on oral Lasix. She  received a lot of IV fluids in the ICU. She has been given IV Lasix with good diuresis.   Anemia. This remains stable.  Acute renal failure This has resolved.  Diabetes, type II. Stable. CBG's well controlled. Continue with Lantus insulin and sliding scale.   Nausea Improved with Reglan. She could have diabetic gastroparesis.  Skin rash. Etiology for this remains unclear, but is thought to be secondary to sepsis. Rash is improving. Continue with topical steroids.  Code Status She is a full code   DVT Prophylaxis Is on oral anticoagulation   Disposition Needs home health. Is much improved. Anticipate discharge sometime this week, possibly in 1-2 days.   LOS: 10 days   [facility]  [facility] Pager [phone] 05/22/2012, 9:50 AM

## 2012-05-23 DIAGNOSIS — E1165 Type 2 diabetes mellitus with hyperglycemia: Secondary | ICD-10-CM

## 2012-05-23 DIAGNOSIS — R21 Rash and other nonspecific skin eruption: Secondary | ICD-10-CM

## 2012-05-23 DIAGNOSIS — L02419 Cutaneous abscess of limb, unspecified: Secondary | ICD-10-CM

## 2012-05-23 DIAGNOSIS — L03119 Cellulitis of unspecified part of limb: Secondary | ICD-10-CM

## 2012-05-23 DIAGNOSIS — E8779 Other fluid overload: Secondary | ICD-10-CM

## 2012-05-23 LAB — BASIC METABOLIC PANEL
CO2: 26 mEq/L (ref 19–32)
Chloride: 105 mEq/L (ref 96–112)
Glucose, Bld: 181 mg/dL — ABNORMAL HIGH (ref 70–99)
Potassium: 4.1 mEq/L (ref 3.5–5.1)
Sodium: 142 mEq/L (ref 135–145)

## 2012-05-23 LAB — GLUCOSE, CAPILLARY
Glucose-Capillary: 162 mg/dL — ABNORMAL HIGH (ref 70–99)
Glucose-Capillary: 135 mg/dL — ABNORMAL HIGH (ref 70–99)

## 2012-05-23 MED ORDER — POLYETHYLENE GLYCOL 3350 17 G PO PACK: 17.0000 g | PACK | Freq: Every day | ORAL | Status: AC | PRN

## 2012-05-23 MED ORDER — LINEZOLID 600 MG PO TABS: 600.0000 mg | ORAL_TABLET | Freq: Two times a day (BID) | ORAL | Status: AC

## 2012-05-23 MED ORDER — POTASSIUM CHLORIDE CRYS ER 20 MEQ PO TBCR: EXTENDED_RELEASE_TABLET | ORAL | Status: DC

## 2012-05-23 MED ORDER — CALCIUM CARBONATE 1250 (500 CA) MG PO TABS: 1.0000 | ORAL_TABLET | Freq: Three times a day (TID) | ORAL | Status: DC

## 2012-05-23 MED ORDER — METOCLOPRAMIDE HCL 5 MG PO TABS: 5.0000 mg | ORAL_TABLET | Freq: Three times a day (TID) | ORAL | Status: DC

## 2012-05-23 MED ORDER — AMIODARONE HCL 200 MG PO TABS: ORAL_TABLET | ORAL | Status: DC

## 2012-05-23 MED ORDER — AMITRIPTYLINE HCL 75 MG PO TABS: 75.0000 mg | ORAL_TABLET | Freq: Every day | ORAL | Status: DC

## 2012-05-23 MED ORDER — APIXABAN 5 MG PO TABS: 5.0000 mg | ORAL_TABLET | Freq: Two times a day (BID) | ORAL | Status: DC

## 2012-05-23 MED ORDER — FUROSEMIDE 40 MG PO TABS: ORAL_TABLET | ORAL | Status: DC

## 2012-05-23 MED ORDER — METOPROLOL TARTRATE 50 MG PO TABS: 50.0000 mg | ORAL_TABLET | Freq: Two times a day (BID) | ORAL | Status: DC

## 2012-05-23 MED ORDER — CAMPHOR-MENTHOL 0.5-0.5 % EX LOTN: TOPICAL_LOTION | CUTANEOUS | Status: AC | PRN

## 2012-05-23 NOTE — Progress Notes (Signed)
RIJ triple lumen cath d/c'd intact. Vaseline gauze pressure dsg applied and pressure held for 3 minutes no bleeding noted. Instructed pt. To  Remain in bed for 30 minutes and to leave pressure dsg on for 24 hrs then remove. Colbert Ewing RN VAST.

## 2012-05-23 NOTE — Discharge Instructions (Signed)
Atrial Fibrillation Your caregiver has diagnosed you with atrial fibrillation (AFib). The heart normally beats very regularly; AFib is a type of irregular heartbeat. The heart rate may be faster or slower than normal. This can prevent your heart from pumping as well as it should. AFib can be constant (chronic) or intermittent (paroxysmal). CAUSES  Atrial fibrillation may be caused by:  Heart disease, including heart attack, coronary artery disease, heart failure, diseases of the heart valves, and others.   Blood clot in the lungs (pulmonary embolism).   Pneumonia or other infections.   Chronic lung disease.   Thyroid disease.   Toxins. These include alcohol, some medications (such as decongestant medications or diet pills), and caffeine.  In some people, no cause for AFib can be found. This is referred to as Lone Atrial Fibrillation. SYMPTOMS   Palpitations or a fluttering in your chest.   A vague sense of chest discomfort.   Shortness of breath.   Sudden onset of lightheadedness or weakness.  Sometimes, the first sign of AFib can be a complication of the condition. This could be a stroke or heart failure. DIAGNOSIS  Your description of your condition may make your caregiver suspicious of atrial fibrillation. Your caregiver will examine your pulse to determine if fibrillation is present. An EKG (electrocardiogram) will confirm the diagnosis. Further testing may help determine what caused you to have atrial fibrillation. This may include chest x-ray, echocardiogram, blood tests, or CT scans. PREVENTION  If you have previously had atrial fibrillation, your caregiver may advise you to avoid substances known to cause the condition (such as stimulant medications, and possibly caffeine or alcohol). You may be advised to use medications to prevent recurrence. Proper treatment of any underlying condition is important to help prevent recurrence. PROGNOSIS  Atrial fibrillation does tend to  become a chronic condition over time. It can cause significant complications (see below). Atrial fibrillation is not usually immediately life-threatening, but it can shorten your life expectancy. This seems to be worse in women. If you have lone atrial fibrillation and are under 60 years old, the risk of complications is very low, and life expectancy is not shortened. RISKS AND COMPLICATIONS  Complications of atrial fibrillation can include stroke, chest pain, and heart failure. Your caregiver will recommend treatments for the atrial fibrillation, as well as for any underlying conditions, to help minimize risk of complications. TREATMENT  Treatment for AFib is divided into several categories:  Treatment of any underlying condition.   Converting you out of AFib into a regular (sinus) rhythm.   Controlling rapid heart rate.   Prevention of blood clots and stroke.  Medications and procedures are available to convert your atrial fibrillation to sinus rhythm. However, recent studies have shown that this may not offer you any advantage, and cardiac experts are continuing research and debate on this topic. More important is controlling your rapid heartbeat. The rapid heartbeat causes more symptoms, and places strain on your heart. Your caregiver will advise you on the use of medications that can control your heart rate. Atrial fibrillation is a strong stroke risk. You can lessen this risk by taking blood thinning medications such as Coumadin (warfarin), or sometimes aspirin. These medications need close monitoring by your caregiver. Over-medication can cause bleeding. Too little medication may not protect against stroke. HOME CARE INSTRUCTIONS   If your caregiver prescribed medicine to make your heartbeat more normally, take as directed.   If blood thinners were prescribed by your caregiver, take EXACTLY as directed.     Perform blood tests EXACTLY as directed.   Quit smoking. Smoking increases your  cardiac and lung (pulmonary) risks.   DO NOT drink alcohol.   DO NOT drink caffeinated drinks (e.g. coffee, soda, chocolate, and leaf teas). You may drink decaffeinated coffee, soda or tea.   If you are overweight, you should choose a reduced calorie diet to lose weight. Please see a registered dietitian if you need more information about healthy weight loss. DO NOT USE DIET PILLS as they may aggravate heart problems.   If you have other heart problems that are causing AFib, you may need to eat a low salt, fat, and cholesterol diet. Your caregiver will tell you if this is necessary.   Exercise every day to improve your physical fitness. Stay active unless advised otherwise.   If your caregiver has given you a follow-up appointment, it is very important to keep that appointment. Not keeping the appointment could result in heart failure or stroke. If there is any problem keeping the appointment, you must call back to this facility for assistance.  SEEK MEDICAL CARE IF:  You notice a change in the rate, rhythm or strength of your heartbeat.   You develop an infection or any other change in your overall health status.  SEEK IMMEDIATE MEDICAL CARE IF:   You develop chest pain, abdominal pain, sweating, weakness or feel sick to your stomach (nausea).   You develop shortness of breath.   You develop swollen feet and ankles.   You develop dizziness, numbness, or weakness of your face or limbs, or any change in vision or speech.  MAKE SURE YOU:   Understand these instructions.   Will watch your condition.   Will get help right away if you are not doing well or get worse.  Document Released: 11/16/2005 Document Revised: 11/05/2011 Document Reviewed: 06/20/2008 [employer] Patient Information 2012 [employer]. 

## 2012-05-23 NOTE — Progress Notes (Signed)
   CARE MANAGEMENT NOTE 05/23/2012  Patient:  Pamela [name], Pamela [name]   Account Number:  [id number]  Date Initiated:  05/13/2012  Documentation initiated by:  SIMMONS,CRYSTAL  Subjective/Objective Assessment:   ADMITTED WITH CELLULITIS, NEW ONSET AFIB/ RVR;  LIVES AT HOME WITH HER HUSBAND- MACK; IPTA; USES [facility] IN [place]/ [place].     Action/Plan:   DISCHARGE PLANNING INITIATED.   Anticipated DC Date:  05/15/2012   Anticipated DC Plan:  HOME W HOME HEALTH SERVICES      DC Planning Services  CM consult      [facility]  HOME HEALTH   Choice offered to / List presented to:  C-1 Patient        HH arranged  HH-2 PT  HH-3 OT  HH-1 RN      [facility] agency  [facility]   Status of service:  Completed, signed off Medicare Important Message given?  NO (If response is "NO", the following Medicare IM given date fields will be blank) Date Medicare IM given:   Date Additional Medicare IM given:    Discharge Disposition:  HOME W HOME HEALTH SERVICES  Per UR Regulation:  Reviewed for med. necessity/level of care/duration of stay  If discussed at Long Length of Stay Meetings, dates discussed:    Comments:  05/23/12 1145 Spoke with pt. about [facility] services and gave pt. list of agencies.  Pt. chose [facility].  TC to Hilda Lias, with [facility], to make referral for [facility] RN, [facility] PT/OT.  Pt. anticipated to discharge home today. Tera Mater, RN, BSN [place] [phone]   05-17-12 8:50am Avie Arenas, RNBSN [phone] Became hypotensive - tx to ICU - on neo, cardizem for AFib which is now back in NSR, and IV heparin.  Renal failure which appears to have resolved and renal signed off on. Will continue to follow for dc needs.  05/13/12  0910  CRYSTAL SIMMONS RN, BSN [phone] NCM WILL FOLLOW.  CONTACTCOURTNEI RUDDELL253 [phone]

## 2012-05-23 NOTE — Progress Notes (Signed)
OT Cancellation Note  Pt politely declined OT therapy today secondary to waiting for her IV to be removed so she can go home.  Discussed her bathroom situation and she reprts feeling that she will not need a 3:1 for home because she already has handicapped toilets and a vanity beside of the toilet.  No further acute OT needs.  Harinder Romas OTR/L 05/23/2012, 2:07 PM Pager number [phone]

## 2012-05-23 NOTE — Progress Notes (Signed)
Physical Therapy Treatment Patient Details Name: Pamela [name] MRN: [id number] DOB: 06-07-1934 Today's Date: 05/23/2012 Time: 8657-8469 PT Time Calculation (min): 25 min  PT Assessment / Plan / Recommendation Comments on Treatment Session       Follow Up Recommendations  Home health PT;Supervision - Intermittent    Barriers to Discharge        Equipment Recommendations  3 in 1 bedside comode    Recommendations for Other Services    Frequency Min 3X/week   Plan Discharge plan remains appropriate;Frequency needs to be updated    Precautions / Restrictions Precautions Precautions: Fall Restrictions Weight Bearing Restrictions: No   Pertinent Vitals/Pain Pt denied pain.    Mobility  Bed Mobility Bed Mobility: Not assessed Transfers Transfers: Sit to Stand;Stand to Sit Sit to Stand: 5: Supervision;From chair/3-in-1;With armrests;With upper extremity assist Stand to Sit: 5: Supervision;To chair/3-in-1;With upper extremity assist;With armrests Details for Transfer Assistance: Min VCs for hand placement and technique. Ambulation/Gait Ambulation/Gait Assistance: 5: Supervision Ambulation Distance (Feet): 150 Feet Assistive device: Rolling walker Ambulation/Gait Assistance Details: no assistance needed.  Gait Pattern: Step-to pattern;Trunk flexed Stairs: No    Exercises     PT Diagnosis:    PT Problem List:   PT Treatment Interventions:     PT Goals Acute Rehab PT Goals PT Goal Formulation: With patient Time For Goal Achievement: 05/25/12 Potential to Achieve Goals: Good Pt will go Supine/Side to Sit: with supervision;with HOB 0 degrees PT Goal: Supine/Side to Sit - Progress: Progressing toward goal Pt will go Sit to Supine/Side: with supervision;with HOB 0 degrees Pt will go Sit to Stand: with supervision;with upper extremity assist PT Goal: Sit to Stand - Progress: Met Pt will Ambulate: 51 - 150 feet;with supervision;with least restrictive assistive device PT  Goal: Ambulate - Progress: Progressing toward goal Pt will Go Up / Down Stairs: 1-2 stairs;with min assist;with least restrictive assistive device  Visit Information  Last PT Received On: 05/23/12    Subjective Data  Subjective: I am doing much better.     Cognition  Overall Cognitive Status: Appears within functional limits for tasks assessed/performed Arousal/Alertness: Awake/alert Orientation Level: Appears intact for tasks assessed Behavior During Session: [facility] for tasks performed    Balance  Balance Balance Assessed: No  End of Session PT - End of Session Equipment Utilized During Treatment: Gait belt Activity Tolerance: Patient tolerated treatment well Patient left: in chair;with call bell/phone within reach Nurse Communication: Mobility status    Monisha Siebel 05/23/2012, 12:55 PM Jerico Grisso L. Laniqua Torrens DPT [phone]

## 2012-05-23 NOTE — Progress Notes (Signed)
Discharge instructions and prescriptions given .to pt verbalized understanding 

## 2012-05-23 NOTE — Discharge Summary (Signed)
Physician Discharge Summary  Patient ID: Pamela [name] MRN: [id number] DOB/AGE: [birth year] 78 y.o.  Admit date: 05/12/2012 Discharge date: 05/23/2012  PCP: Veneda Melter, MD  DISCHARGE DIAGNOSES:  Principal Problem:  *Cellulitis in diabetic foot Active Problems:  DM (diabetes mellitus)  HTN (hypertension)  Hypercholesterolemia  Afib, paroxysmal, with RVR at times with BBB  Anemia  Diastolic dysfunction, left ventricle  Acute kidney failure  Skin rash  Fluid overload   DISCHARGE CONDITION: fair  INITIAL HISTORY: Pamela [name] is an 78 y.o. female with history of diabetes, hypertension, with right foot ulcer managed by the podiatrist, presented to the emergency room with increased redness and swelling. She has no fever or chills, headache, nausea, vomiting or any other symptomology. Evaluation in emergency room included a normal white count of 5.5 thousand, hemoglobin of 11 g per decaliter, blood sugar of 249 with normal renal function tests. Plain film of the distal tibia fibula revealed no osseous abnormality. In the emergency room, she developed atrial fibrillation with rapid ventricular rate of 150. She stated she has intermittently felt this way before. She never had any stroke or TIA symptomology. No history of congestive heart failure. She has no history of bleeding ulcer, easy bruisability, or frequent falls.   Patient subsequently developed a skin rash. Her heart rate did not respond to oral medications. She became more lethargic. She started spiking fevers up to 103F. Subsequently, she was transferred to the intensive care unit. She had fluctuations in her blood pressure as well. Cardiology was consulted and she was started on amiodarone infusion.. Infectious disease was consulted because of the fevers, cellulitis, as well as a rash. 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 She was also seen by  nephrology for acute renal failure. All these issues started subsiding. Subsequently, she was transferred back to the floor.   Consultations during this hospitalization: Nephrology, infectious disease, critical care medicine, dermatology, cardiology   HOSPITAL COURSE:   Cellulitis of the right lower extremity  Patient has been on multiple antibiotics over the course of this hospitalization including vancomycin, Zosyn, clindamycin, cipro, linezolid and aztreonam. Infectious disease had been following the patient. The patient started improving with oral Linezolid. She be continued on it for 2 more days to complete course. Blood cultures all been negative. Doppler negative for DVT.   Atrial fibrillation with periodic RVR's  Patient required intravenous rate limiting drugs initially. Subsequently, she was given amiodarone infusion. She is converted to sinus rhythm. She is on amiodarone orally along with metoprolol. Dose of metoprolol increased on 6/21. Few episodes of PVC's and NSVT. Cardiology had been following her. Echocardiogram showed normal systolic function with Diastolic dysfunction. She is on oral anticoagulation. She will need continued follow up with cardiology.  Fluid overload.  IV fluids was given for sepsis. Patient became edematous. She was given Lasix intravenously initially. She diuresed quite well. She'll be discharged on oral Lasix for a few days. Subsequently she can take it as needed depending upon her lower extremity edema. Further management can be discussed with cardiology.   Anemia.  Hemoglobin did drop some due to hemodilution. However, has been stable. No overt bleeding has been noted.   Acute renal failure  Patient had the increase in her BUN and creatinine. This resolved with IV hydration. She was seen by nephrology during this hospitalization.   Diabetes, type II.  This has remained stable. Because of renal failure. Her  metformin was discontinued. She was put on  Lantus. At home. She may resume her metformin as his renal function is back to normal. She will need to followup with her primary care physician for further management of the same. Her HbA1c was 7.6.  Nausea  This was thought to be secondary to her acute illness. Diabetic gastroparesis. Could not be excluded. She was started on Reglan and her symptoms have improved.   Skin rash.  After patient was started on multiple antibiotics she developed an erythematous, maculopapular rash. The rash was quite significant involving her trunk and her upper and lower extremities. She has a history of Trudie Buckler syndrome and so, this was a major concern. Dermatology was consulted. However, they felt that the rash was secondary to her sepsis. She was given some creams to apply and now her rash is resolving and it persists only in a few areas on her body.   Patient has significantly improved from her hospitalization and her infection is looking much better with respect to the cellulitis. Her rash is improving as well. Patient has been ambulating with a walker with minimal difficulty. Home health has been recommended by the physical therapist, which will be arranged. She is at this time considered stable for discharge. She had a central line placed, which will be removed prior to discharge.   PERTINENT LABS:  Her BUN and creatinine climbed from normal to 33 and 1.89, respectively. Subsequently, they started trending down, and both are normal. At this time. Final level was 59. Ferritin was 12. TIBC was 319. Vitamin B 12 level was 1050. ESR was 60. TSH was 0.30 initially, subsequently, it was 2.5, 4, and free T4 was 1.2.  2D ECHOCARDIOGRAM  Study Conclusions  - Left ventricle: The cavity size was normal. Wall thickness was increased in a pattern of moderate to severe LVH. The estimated ejection fraction was 65%. Wall motion was normal; there were no regional wall motion abnormalities. Doppler parameters are  consistent with abnormal left ventricular relaxation (grade 1 diastolic dysfunction). - Right ventricle: The cavity size was normal. Systolic function was normal. - Impressions: Patient developed SVT during the study.   IMAGING STUDIES Dg Tibia/fibula Right  05/12/2012  *RADIOLOGY REPORT*  Clinical Data: Right leg swelling  RIGHT TIBIA AND FIBULA - 2 VIEW  Comparison: None.  Findings: Diffuse soft tissue swelling.  Scattered vascular calcifications.  No acute or aggressive osseous abnormality identified.  IMPRESSION: Soft tissue swelling without underlying osseous abnormality identified.  Original Report Authenticated By: Waneta Martins, M.D.   Ct Tibia Fibula Right Wo Contrast  05/15/2012  *RADIOLOGY REPORT*  Clinical Data: Worsening cellulitis.  Evaluate for abscess.  CT OF THE RIGHT TIBIA FIBULA WITHOUT CONTRAST  Comparison: 05/12/2012.  Findings: Diffuse subcutaneous edema is present in the leg extending through the ankle and hind foot.  No focal fluid collections are identified suspicious for abscess.  The anterior and posterior muscular compartments are within normal limits. Atherosclerosis of the below-the-knee small vessels.  Phleboliths are present in the soft tissues.  There is no fracture.  Moderate midfoot osteoarthritis is present.  Large subchondral geode is present in the posterior calcaneus adjacent to the subtalar joint. This appears degenerative.  Achilles tendon appears intact. Posteromedial, posterolateral and anterior tendon groups appear within normal limits.  Osteoarthritis of the knee is incidentally noted.  Significantly, there is no gas in the soft tissues to suggest necrotizing fasciitis for infection with a gas-forming organism. No osteomyelitis.  IMPRESSION: Diffuse infiltration  of the subcutaneous tissues compatible with cellulitis.  No abscess or gas in the soft tissues.  Osteoarthritis of the knee and foot.  Original Report Authenticated By: Andreas Newport, M.D.   US  Renal Port  05/16/2012  *RADIOLOGY REPORT*  Clinical Data: 78 year old female with renal failure.  History of diabetes and hypertension.  RENAL/URINARY TRACT ULTRASOUND COMPLETE  Comparison:  09/04/2009 renal ultrasound from [facility].  Findings:  Right Kidney:  The right kidney is normal in echogenicity measuring 9.6 cm.  Moderate cortical atrophy is present.  There is no evidence of hydronephrosis, solid renal mass or definite renal calculi.  Left Kidney:  The left kidney is normal in echogenicity measuring 11 cm.  Moderate cortical atrophy is present.  A 2.7 x 3.3 cm cyst within the lower left kidney is noted.  There is no evidence of hydronephrosis, solid mass or definite renal calculi.  Bladder:  A Foley catheter within the collapsed bladder is noted.  IMPRESSION: No evidence of acute abnormality.  Moderate bilateral renal cortical atrophy.  Left lower pole renal cyst again noted.  Foley catheter within the bladder.  Original Report Authenticated By: Rosendo Gros, M.D.   Dg Chest Port 1 View  05/18/2012  *RADIOLOGY REPORT*  Clinical Data: Hypoxic respiratory failure.  PORTABLE CHEST - 1 VIEW  Comparison: Chest x-ray 05/17/2012.  Findings: There is a right-sided internal jugular central venous catheter with tip terminating in the mid superior vena cava. Lung volumes are low.  There is a persistent left basilar opacity, favored to represent an area of atelectasis, however, underlying airspace consolidation from infection or aspiration is difficult to exclude.  Improving aeration at the right base suggests decreasing atelectasis.  Blunting of the left costophrenic sulcus, compatible with a small left-sided pleural effusion (similar to prior).  Mild pulmonary venous congestion without frank pulmonary edema.  Heart size is borderline enlarged.  Mediastinal contours are unremarkable.  Atherosclerotic calcifications within the arch of the aorta.  IMPRESSION: 1.  Slight improving bibasilar  aeration with persistent left lower lobe subsegmental atelectasis and/or consolidation. 2.  Small left-sided pleural effusion. 3.  Atherosclerosis.  Original Report Authenticated By: Florencia Reasons, M.D.   Dg Chest Port 1 View  05/17/2012  *RADIOLOGY REPORT*  Clinical Data: Left lower lobe infiltrate, shortness of breath.  PORTABLE CHEST - 1 VIEW  Comparison: 05/16/2012  Findings: Increasing bibasilar airspace opacities, left greater than right, atelectasis or infiltrates.  Possible small left pleural effusion.  Heart is normal size.  Right central line is unchanged.  IMPRESSION: Increasing bibasilar atelectasis or infiltrates, left greater than right.  Suspect small left effusion.  Original Report Authenticated By: Cyndie Chime, M.D.   Dg Chest Port 1 View  05/16/2012  *RADIOLOGY REPORT*  Clinical Data: Shortness of breath.  Question pneumonia.  PORTABLE CHEST - 1 VIEW  Comparison: Chest 05/15/2012.  Findings: Right IJ catheter remains in place.  Lungs appear clear. No pneumothorax or pleural fluid.  Right costophrenic angle is just off the margin of film.  Heart size normal.  IMPRESSION: No acute finding.  Original Report Authenticated By: Bernadene Bell. Maricela Curet, M.D.   Dg Chest Port 1 View  05/15/2012  *RADIOLOGY REPORT*  Clinical Data: Right IJ central line placement  PORTABLE CHEST - 1 VIEW  Comparison: None.  Findings: Normal cardiac silhouette and mediastinal contours. Minimal atherosclerotic calcification within the aortic arch. Interval placement of a right jugular approach central venous catheter with tip overlying the distal SVC.  No pneumothorax.  No focal airspace opacities.  No definite pneumothorax or pleural effusion.  Unchanged bones.  IMPRESSION: 1.  Right internal jugular approach central venous catheter tip overlies the distal SVC.  No pneumothorax. 2. No acute cardiopulmonary disease.  Original Report Authenticated By: Waynard Reeds, M.D.   Dg Foot Complete Right  05/12/2012   *RADIOLOGY REPORT*  Clinical Data: Leg swelling, right foot pain status post trauma.  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  The second through fifth digits are flexed at the PIP joints, limiting evaluation.  There is diffuse osteopenia. Diffuse soft tissue swelling. Metatarsal phalangeal degenerative changes.  Due to technique, the Lisfranc joint is poorly visualized on today's examination.  Plantar calcaneal enthesopathic change. No acute fracture identified.  IMPRESSION: Significantly limited radiograph due to positioning and flexion of the PIP joints.  Diffuse osteopenia and soft tissue swelling.  No displaced fracture identified.  The Lisfranc joint is poorly visualized. If clinical concern for a fracture persists, recommend a repeat radiograph in 5-10 days to evaluate for interval change or callus formation.  Original Report Authenticated By: Waneta Martins, M.D.    DISCHARGE EXAMINATION: Blood pressure 115/72, pulse 65, temperature 98.5 F (36.9 C), temperature source Oral, resp. rate 16, height 5\' 5"  (1.651 m), weight 80.3 kg (177 lb 0.5 oz), SpO2 96.00%. General appearance: alert, cooperative, appears stated age and no distress Resp: clear to auscultation bilaterally Cardio: regular rate and rhythm, S1, S2 normal, no murmur, click, rub or gallop GI: soft, non-tender; bowel sounds normal; no masses,  no organomegaly Extremities: She has minimal edema now. She does have cellulitis in the right leg, however, it's improving. Right leg is more enlarged than the left due to the infection. Neurologic: Alert and oriented X 3, normal strength and tone. Normal symmetric reflexes. Normal coordination and gait  DISPOSITION: Home with home health  Discharge Orders    Future Orders Please Complete By Expires   Diet Carb Modified      Increase activity slowly      Call MD for:      Comments:   For  shortness of breath, chest pain, fever, chills,   Call MD for:  persistant nausea and vomiting        Current Discharge Medication List    START taking these medications   Details  amiodarone (PACERONE) 200 MG tablet Take 2 tablets twice daily for 2 days, then take 2 tablets once daily for 7 days, then take 1 tablet once daily till seen by heart doctor Qty: 60 tablet, Refills: 1    apixaban (ELIQUIS) 5 MG TABS tablet Take 1 tablet (5 mg total) by mouth 2 (two) times daily. Qty: 60 tablet, Refills: 1    calcium carbonate (OS-CAL - DOSED IN MG OF ELEMENTAL CALCIUM) 1250 MG tablet Take 1 tablet (500 mg of elemental calcium total) by mouth 3 (three) times daily. Qty: 90 tablet, Refills: 1    camphor-menthol (SARNA) lotion Apply topically as needed for itching. Qty: 222 mL, Refills: 0    furosemide (LASIX) 40 MG tablet TAKE 1 TABLET DAILY FOR 5 DAYS AND THEN AS NEEDED FOR LEG SWELLING Qty: 30 tablet, Refills: 0    linezolid (ZYVOX) 600 MG tablet Take 1 tablet (600 mg total) by mouth every 12 (twelve) hours. FOR 2 DAYS Qty: 4 tablet, Refills: 0    metoCLOPramide (REGLAN) 5 MG tablet Take 1 tablet (5  mg total) by mouth 4 (four) times daily -  before meals and at bedtime. Qty: 120 tablet, Refills: 1    polyethylene glycol (MIRALAX / GLYCOLAX) packet Take 17 g by mouth daily as needed (CONSTIPATION). Qty: 14 each, Refills: 0    potassium chloride SA (K-DUR,KLOR-CON) 20 MEQ tablet 1 TABLET DAILY FOR 5 DAYS AND THEN TAKE ONLY WHEN YOU TAKE LASIX Qty: 30 tablet, Refills: 1      CONTINUE these medications which have CHANGED   Details  amitriptyline (ELAVIL) 75 MG tablet Take 1 tablet (75 mg total) by mouth at bedtime. MAY RESUME IN 3 DAYS, NO EARLIER    metoprolol (LOPRESSOR) 50 MG tablet Take 1 tablet (50 mg total) by mouth 2 (two) times daily. Qty: 60 tablet, Refills: 1      CONTINUE these medications which have NOT CHANGED   Details  atorvastatin (LIPITOR) 40 MG tablet Take 40 mg  by mouth daily.    esomeprazole (NEXIUM) 40 MG capsule Take 40 mg by mouth daily before breakfast.    lisinopril (PRINIVIL,ZESTRIL) 20 MG tablet Take 20 mg by mouth daily.    metFORMIN (GLUCOPHAGE) 1000 MG tablet Take 1,000 mg by mouth 2 (two) times daily.    nabumetone (RELAFEN) 500 MG tablet Take 500 mg by mouth 2 (two) times daily as needed. Pain    Tamsulosin HCl (FLOMAX) 0.4 MG CAPS Take 0.4 mg by mouth daily.      STOP taking these medications     ciprofloxacin (CIPRO) 500 MG tablet        Follow-up Information    Follow up with [facility], [place]. Schedule an appointment as soon as possible for a visit in 1 week. (post hospitalization follow up)    Contact information:   [address] [place] [address] [phone]       Follow up with Thurmon Fair, MD. Schedule an appointment as soon as possible for a visit in 2 weeks.   Contact information:   [address] [place] [address] [phone]          TOTAL DISCHARGE TIME: 45 mins  [facility]  [facility] Pager [phone]  05/23/2012, 10:53 AM

## 2012-06-01 DIAGNOSIS — N189 Chronic kidney disease, unspecified: Secondary | ICD-10-CM

## 2012-06-01 DIAGNOSIS — S81809A Unspecified open wound, unspecified lower leg, initial encounter: Secondary | ICD-10-CM

## 2012-06-01 DIAGNOSIS — S81009A Unspecified open wound, unspecified knee, initial encounter: Secondary | ICD-10-CM

## 2012-06-01 DIAGNOSIS — S91009A Unspecified open wound, unspecified ankle, initial encounter: Secondary | ICD-10-CM

## 2012-06-01 HISTORY — DX: Unspecified open wound, unspecified lower leg, initial encounter: S81.009A

## 2012-06-01 HISTORY — DX: Chronic kidney disease, unspecified: N18.9

## 2012-07-16 ENCOUNTER — Emergency Department: Payer: Self-pay | Admitting: Emergency Medicine

## 2012-07-16 LAB — URINALYSIS, COMPLETE
Bacteria: NONE SEEN
Bilirubin,UR: NEGATIVE
Nitrite: NEGATIVE
Ph: 7 (ref 4.5–8.0)
Protein: 30
RBC,UR: 27 /HPF (ref 0–5)
Specific Gravity: 1.01 (ref 1.003–1.030)

## 2012-07-29 DIAGNOSIS — R32 Unspecified urinary incontinence: Secondary | ICD-10-CM

## 2012-07-29 DIAGNOSIS — R339 Retention of urine, unspecified: Secondary | ICD-10-CM

## 2012-07-29 DIAGNOSIS — R350 Frequency of micturition: Secondary | ICD-10-CM

## 2012-07-29 HISTORY — DX: Frequency of micturition: R35.0

## 2012-07-29 HISTORY — DX: Unspecified urinary incontinence: R32

## 2012-07-29 HISTORY — DX: Retention of urine, unspecified: R33.9

## 2012-11-04 ENCOUNTER — Emergency Department: Payer: Self-pay | Admitting: Unknown Physician Specialty

## 2013-01-20 ENCOUNTER — Ambulatory Visit: Payer: Self-pay | Admitting: Internal Medicine

## 2013-06-29 DIAGNOSIS — N3020 Other chronic cystitis without hematuria: Secondary | ICD-10-CM

## 2013-06-29 HISTORY — DX: Other chronic cystitis without hematuria: N30.20

## 2013-07-07 ENCOUNTER — Observation Stay: Payer: Self-pay | Admitting: Internal Medicine

## 2013-07-07 LAB — COMPREHENSIVE METABOLIC PANEL
Albumin: 3.5 g/dL (ref 3.4–5.0)
Alkaline Phosphatase: 60 U/L (ref 50–136)
Anion Gap: 7 (ref 7–16)
BUN: 27 mg/dL — ABNORMAL HIGH (ref 7–18)
Bilirubin,Total: 0.2 mg/dL (ref 0.2–1.0)
Calcium, Total: 9.6 mg/dL (ref 8.5–10.1)
Co2: 24 mmol/L (ref 21–32)
EGFR (African American): 44 — ABNORMAL LOW
EGFR (Non-African Amer.): 38 — ABNORMAL LOW
Potassium: 4.6 mmol/L (ref 3.5–5.1)
SGOT(AST): 22 U/L (ref 15–37)
Sodium: 139 mmol/L (ref 136–145)
Total Protein: 6.7 g/dL (ref 6.4–8.2)

## 2013-07-07 LAB — CBC
MCHC: 32.9 g/dL (ref 32.0–36.0)
MCV: 90 fL (ref 80–100)
RDW: 15.9 % — ABNORMAL HIGH (ref 11.5–14.5)
WBC: 5.1 10*3/uL (ref 3.6–11.0)

## 2013-07-07 LAB — TROPONIN I
Troponin-I: <0.02 ng/mL
Troponin-I: <0.02 ng/mL

## 2013-07-07 LAB — CK TOTAL AND CKMB (NOT AT ARMC): CK, Total: 73 U/L (ref 21–215)

## 2013-07-08 LAB — CBC WITH DIFFERENTIAL/PLATELET
Basophil #: 0.1 10*3/uL (ref 0.0–0.1)
Basophil %: 2.3 %
Eosinophil #: 0.3 10*3/uL (ref 0.0–0.7)
Eosinophil %: 6.8 %
HCT: 30.5 % — ABNORMAL LOW (ref 35.0–47.0)
Lymphocyte %: 34.6 %
MCHC: 33.4 g/dL (ref 32.0–36.0)
MCV: 89 fL (ref 80–100)
Monocyte #: 0.4 x10 3/mm (ref 0.2–0.9)
Monocyte %: 8.5 %
Neutrophil #: 2.3 10*3/uL (ref 1.4–6.5)
Neutrophil %: 47.8 %
Platelet: 174 10*3/uL (ref 150–440)
RDW: 15.6 % — ABNORMAL HIGH (ref 11.5–14.5)
WBC: 4.8 10*3/uL (ref 3.6–11.0)

## 2013-07-08 LAB — BASIC METABOLIC PANEL
Anion Gap: 7 (ref 7–16)
BUN: 24 mg/dL — ABNORMAL HIGH (ref 7–18)
Chloride: 108 mmol/L — ABNORMAL HIGH (ref 98–107)
Creatinine: 1.16 mg/dL (ref 0.60–1.30)
EGFR (African American): 53 — ABNORMAL LOW
Glucose: 135 mg/dL — ABNORMAL HIGH (ref 65–99)
Osmolality: 287 (ref 275–301)
Potassium: 4.3 mmol/L (ref 3.5–5.1)

## 2013-07-08 LAB — CK TOTAL AND CKMB (NOT AT ARMC)
CK, Total: 52 U/L (ref 21–215)
CK-MB: 1.5 ng/mL (ref 0.5–3.6)

## 2013-07-08 LAB — LIPID PANEL: Triglycerides: 111 mg/dL (ref 0–200)

## 2013-11-07 ENCOUNTER — Ambulatory Visit: Payer: Self-pay | Admitting: Gastroenterology

## 2013-11-27 ENCOUNTER — Encounter: Payer: Self-pay | Admitting: Podiatry

## 2013-11-30 ENCOUNTER — Encounter: Payer: Self-pay | Admitting: Podiatry

## 2013-12-31 ENCOUNTER — Encounter: Payer: Self-pay | Admitting: Podiatry

## 2014-01-28 ENCOUNTER — Encounter: Payer: Self-pay | Admitting: Podiatry

## 2014-02-28 ENCOUNTER — Encounter: Payer: Self-pay | Admitting: Podiatry

## 2014-06-27 ENCOUNTER — Emergency Department: Payer: Self-pay | Admitting: Emergency Medicine

## 2014-06-28 ENCOUNTER — Ambulatory Visit: Payer: Self-pay | Admitting: Physician Assistant

## 2014-06-30 ENCOUNTER — Ambulatory Visit: Payer: Self-pay | Admitting: Family Medicine

## 2014-06-30 LAB — CBC WITH DIFFERENTIAL/PLATELET
Basophil #: 0.1 10*3/uL (ref 0.0–0.1)
Basophil %: 1.2 %
EOS PCT: 3.4 %
Eosinophil #: 0.2 10*3/uL (ref 0.0–0.7)
HCT: 31.8 % — ABNORMAL LOW (ref 35.0–47.0)
HGB: 10.1 g/dL — ABNORMAL LOW (ref 12.0–16.0)
LYMPHS ABS: 1.7 10*3/uL (ref 1.0–3.6)
Lymphocyte %: 28.4 %
MCH: 29.4 pg (ref 26.0–34.0)
MCHC: 31.9 g/dL — AB (ref 32.0–36.0)
MCV: 92 fL (ref 80–100)
MONO ABS: 0.5 x10 3/mm (ref 0.2–0.9)
Monocyte %: 8.7 %
Neutrophil #: 3.5 10*3/uL (ref 1.4–6.5)
Neutrophil %: 58.3 %
PLATELETS: 211 10*3/uL (ref 150–440)
RBC: 3.45 10*6/uL — ABNORMAL LOW (ref 3.80–5.20)
RDW: 16.3 % — AB (ref 11.5–14.5)
WBC: 6 10*3/uL (ref 3.6–11.0)

## 2014-07-02 ENCOUNTER — Ambulatory Visit: Payer: Self-pay

## 2014-07-02 DIAGNOSIS — R600 Localized edema: Secondary | ICD-10-CM

## 2014-07-02 HISTORY — DX: Localized edema: R60.0

## 2014-08-07 DIAGNOSIS — M7050 Other bursitis of knee, unspecified knee: Secondary | ICD-10-CM

## 2014-08-07 DIAGNOSIS — M76899 Other specified enthesopathies of unspecified lower limb, excluding foot: Secondary | ICD-10-CM

## 2014-08-07 HISTORY — DX: Other bursitis of knee, unspecified knee: M70.50

## 2014-08-07 HISTORY — DX: Other specified enthesopathies of unspecified lower limb, excluding foot: M76.899

## 2014-08-28 DIAGNOSIS — F329 Major depressive disorder, single episode, unspecified: Secondary | ICD-10-CM | POA: Insufficient documentation

## 2014-08-28 DIAGNOSIS — I4891 Unspecified atrial fibrillation: Secondary | ICD-10-CM

## 2014-08-28 DIAGNOSIS — F32A Depression, unspecified: Secondary | ICD-10-CM | POA: Insufficient documentation

## 2014-08-28 HISTORY — DX: Unspecified atrial fibrillation: I48.91

## 2014-08-28 HISTORY — DX: Depression, unspecified: F32.A

## 2014-09-27 DIAGNOSIS — R310 Gross hematuria: Secondary | ICD-10-CM

## 2014-09-27 HISTORY — DX: Gross hematuria: R31.0

## 2015-01-07 ENCOUNTER — Inpatient Hospital Stay: Payer: Self-pay | Admitting: Surgery

## 2015-01-07 LAB — COMPREHENSIVE METABOLIC PANEL
ANION GAP: 12 (ref 7–16)
AST: 28 U/L (ref 15–37)
Albumin: 2.7 g/dL — ABNORMAL LOW (ref 3.4–5.0)
Alkaline Phosphatase: 65 U/L (ref 46–116)
BUN: 58 mg/dL — AB (ref 7–18)
Bilirubin,Total: 0.2 mg/dL (ref 0.2–1.0)
CHLORIDE: 108 mmol/L — AB (ref 98–107)
CO2: 21 mmol/L (ref 21–32)
CREATININE: 2.28 mg/dL — AB (ref 0.60–1.30)
Calcium, Total: 8.7 mg/dL (ref 8.5–10.1)
GFR CALC AF AMER: 27 — AB
GFR CALC NON AF AMER: 22 — AB
GLUCOSE: 138 mg/dL — AB (ref 65–99)
Osmolality: 300 (ref 275–301)
POTASSIUM: 5.2 mmol/L — AB (ref 3.5–5.1)
SGPT (ALT): 27 U/L (ref 14–63)
Sodium: 141 mmol/L (ref 136–145)
Total Protein: 5.9 g/dL — ABNORMAL LOW (ref 6.4–8.2)

## 2015-01-07 LAB — CULTURE, BLOOD (SINGLE)

## 2015-01-07 LAB — CBC
HCT: 26.7 % — ABNORMAL LOW (ref 35.0–47.0)
HGB: 8.4 g/dL — AB (ref 12.0–16.0)
MCH: 30.6 pg (ref 26.0–34.0)
MCHC: 31.4 g/dL — ABNORMAL LOW (ref 32.0–36.0)
MCV: 98 fL (ref 80–100)
Platelet: 166 10*3/uL (ref 150–440)
RBC: 2.73 10*6/uL — AB (ref 3.80–5.20)
RDW: 17.3 % — ABNORMAL HIGH (ref 11.5–14.5)
WBC: 7.8 10*3/uL (ref 3.6–11.0)

## 2015-02-19 LAB — HM MAMMOGRAPHY

## 2015-02-19 LAB — HM PAP SMEAR

## 2015-03-22 NOTE — Consult Note (Signed)
PATIENT NAME:  Pamela [name], Pamela [name] MR#:  [id number] DATE OF BIRTH:  07/12/1934  DATE OF CONSULTATION:  07/08/2013  REFERRING PHYSICIAN:  Dr. Allena KatzPatel.  CONSULTING PHYSICIAN:  Lamar BlinksBruce J. Geovany Trudo, MD  REASON FOR CONSULTATION: Unstable angina with diabetes, hypertension, hyperlipidemia, with abnormal EKG.   CHIEF COMPLAINT: Chest pain.   HISTORY OF PRESENT ILLNESS: This is a 81 year old female with substernal chest discomfort, waxing and waning over the last 2 days radiating into her right arm with concerns of shortness of breath associated with these symptoms. The patient has had diabetes and hypertension controlled with appropriate medication with hyperlipidemia as well. Recently she has had an EKG when seen in the Emergency Room with relief of her chest discomfort with nitroglycerin. The patient's EKG shows normal sinus rhythm with left atrial enlargement, septal myocardial infarction age undetermined. Currently the patient is pain free.   REVIEW OF SYSTEMS: The remainder of the review of systems is negative for vision change, ringing in the ears, hearing loss, cough, congestion, heartburn, nausea, vomiting, diarrhea, bloody stool, stomach pain, extremity pain, leg weakness, cramping of the buttocks, known blood clots, headaches, blackouts, dizzy spells, nosebleeds, congestion, trouble swallowing, frequent urination, urination at night, muscle weakness, numbness, anxiety, depression, skin lesions or skin rashes.   PAST MEDICAL HISTORY: 1.  Hypertension.  2.  Hyperlipidemia.  3.  Diabetes.   FAMILY HISTORY: Mother had coronary artery disease at an early age.   SOCIAL HISTORY: Currently denies alcohol or tobacco use.   ALLERGIES:  As listed.   MEDICATIONS: As listed.   PHYSICAL EXAMINATION: VITAL SIGNS: Blood pressure 126/68 bilaterally, heart rate 72 upright, reclining, and regular.  GENERAL: She is a well appearing female in no acute distress.  HEENT: No icterus, thyromegaly, ulcers,  hemorrhage, or xanthelasma.  CARDIOVASCULAR: Regular rate and rhythm with normal S1, S2 with a 2 out of 6 right upper sternal border murmur, nonradiating. PMI is normal size and placement. Carotid upstroke normal without bruit. Jugular venous pressure is normal.  LUNGS: Lungs are clear to auscultation with normal respirations.  ABDOMEN: Soft, nontender, without hepatosplenomegaly or masses. Abdominal aorta is normal size without bruit.  EXTREMITIES: 2+ bilateral pulses in dorsal, pedal, radial, and femoral arteries without lower extremity edema signs of clubbing or ulcers.  NEUROLOGIC: She is oriented to time, place, and person with normal mood and affect.   ASSESSMENT: This is a 81 year old female with diabetes, hypertension, hyperlipidemia, abnormal EKG while with chest pain and no current evidence of myocardial infarction.   RECOMMENDATIONS: 1.  Treadmill Myoview to assess exercise tolerance, myocardial ischemia, chest pain and anginal symptoms.  2.  Echocardiogram for LV dysfunction, valvular heart disease and murmur undiagnosed.  3.  No change in hypertension, hyperlipidemia. Control with current medical regimen with a goal of moderate intensive cholesterol therapy and systolic pressure below 140 mmHg.  4.  Diabetes control with hemoglobin A1c below 7.  5.  Further ambulation and further evaluation and treatment options as necessary.    ____________________________ Lamar BlinksBruce J. Talmadge Ganas, MD bjk:[id number] D: 07/08/2013 11:30:25 ET T: 07/08/2013 11:45:29 ET JOB#: [id number]  cc: Lamar BlinksBruce J. Raianna Slight, MD, <Dictator> Lamar BlinksBRUCE J Dyesha Henault MD ELECTRONICALLY SIGNED 07/10/2013 7:29

## 2015-03-22 NOTE — Discharge Summary (Signed)
PATIENT NAME:  Pamela [name], Amanda P MR#:  [id number] DATE OF BIRTH:  1934/08/27  DATE OF ADMISSION:  07/07/2013 DATE OF DISCHARGE:  07/08/2013  PRESENTING COMPLAINT:  Chest tightness.   DISCHARGE DIAGNOSES: 1.  Chest pain. Ruled out myocardial infarction. Resolved.  2.  Hypertension.  3.  Chronic atrial fibrillation,  4.  Type 2 diabetes.   PROCEDURES:  Myoview stress test negative.   CODE STATUS: FULL CODE.   MEDICATIONS: 1.  Amiodarone 200 mg daily.  2.  Amitriptyline 35 mg at bedtime.  3.  Atorvastatin 40 mg at bedtime.  4.  Calcium carbonate 500 mg 3 times a day.  5.  Eliquis 5 mg b.i.d.  6.  Nexium 40 mg daily.  7.  Estring 2 mg vaginal ring every 3 months. 8.  NovoLog 70/30, 25 units in the morning and 18 units in the evening.  9.  BiferaRx. 10.  Vitamin B12 with folic acid and iron p.o. daily.  11.  Lisinopril 20 mg daily.  12.  Metformin 1000 mg b.i.d.  13.  Metoprolol 50 mg once a day.  14.  Tamsulosin 0.4 mg 1 capsule after each meal. 15.  MiraLax 17 grams once daily.   DIET: Low sodium, carbohydrate-controlled diet.   DISCHARGE INSTRUCTIONS:   1.  Follow up with Dr. Lady GaryFath in 2 to 4 weeks.  2.  Follow up with your PCP in 1 to 2 weeks.   CONSULTANTS:  Cardiology with Dr. Gwen PoundsKowalski.   LABORATORY AND DIAGNOSTICS: Myoview stress test remained essentially negative for ischemia.   Echo showed EF of 55% to 60%. Normal global left ventricular systolic function.  Mild tricuspid regurgitation. Moderately increased left ventricular posterior wall thickness.   H and H are 10.2 and 30.5. White count is 4.8. Glucose is 135, BUN 24 and creatinine 1.16. Lipid profile within normal limits. Cardiac enzymes x 3 negative.   BRIEF SUMMARY OF HOSPITAL COURSE: [name] is a 81 year old female with history of chronic A-fib, congestive heart failure, hypertension, hyperlipidemia and CKD who came to the Emergency Room after having chest pain with exertion which lasted for about 10  minutes and relieved after rest. She was admitted with:  1.  Chest pain, which resolved. The patient was ruled out with 4 sets of negative cardiac enzymes and negative Myoview stress test. The patient does not have history of CAD.  2.  Atrial fibrillation, chronic.  The patient is on Eliquis for anticoagulation and amiodarone and beta blockers by cardiologist at home.  3.  Hypertension. Continue beta blockers and lisinopril.  4.  Diabetes. Resume metformin.  5.  Hyperlipidemia. On atorvastatin.  Hospital stay otherwise remained stable. The patient remained a FULL CODE.   TIME SPENT: 40 minutes.  ____________________________ Wylie HailSona A. Allena KatzPatel, MD sap:sb D: 07/10/2013 06:38:24 ET T: 07/10/2013 10:14:14 ET JOB#: [id number]  cc: Hildur Bayer A. Allena KatzPatel, MD, <Dictator> Willow OraSONA A Dvaughn Fickle MD ELECTRONICALLY SIGNED 07/21/2013 5:40

## 2015-03-22 NOTE — H&P (Signed)
PATIENT NAME:  Pamela [name], Pamela [name] MR#:  [id number] DATE OF BIRTH:  [birth year]  DATE OF ADMISSION:  07/07/2013  PRIMARY CARE PHYSICIAN: Dr. Alison MurrayAndreassi.   PRIMARY CARDIOLOGIST: Dr. Lady GaryFath.   CHIEF COMPLAINT: Chest tightness.   HISTORY OF PRESENT ILLNESS: The patient is a 81 year old female who has past medical history of CHF, A. fib, chronic kidney disease, hypertension, hyperlipidemia, diabetes. Was working in her backyard yesterday in the evening and then walked to her mailbox, and then while coming back, she has a slope in her driveway and while walking on the slope, she started having severe chest pain, which was 8 out of 10, retrosternal and radiating to her right arm. She sat down and took rest for 10 minutes. She was also feeling shortness of breath at the same time. Taking rest for 10 minutes relieved the pain in the chest. Denies any associated palpitations or dizziness. There was no cough or fever associated with it. Her chest pain is gone, but her right arm pain continued and so she decided to come to Emergency Room today. As her pain was so typical and she has extensive cardiac history, ER physician decided to monitor her on telemetry for further work-up and contacted hospitalist team.   REVIEW OF SYSTEMS:    CONSTITUTIONAL: Negative for fever, fatigue, pain or weight loss.  EYES: No blurring or double vision, pain or redness.  EARS, NOSE, THROAT: No tinnitus, ear pain or hearing loss.  RESPIRATORY: No cough, wheezing, hemoptysis.  CARDIOVASCULAR: Has some chest pain, which was yesterday. Denies any edema on the legs, arrhythmia or palpitations.  GASTROINTESTINAL: Denied any nausea, vomiting, abdominal pain or diarrhea.  GENITOURINARY: Denies any dysuria, hematuria or increased frequency.  ENDOCRINE: Denies any polyhidria, nocturia, heat or cold intolerance.  SKIN: Denied any acne or rashes.  MUSCULOSKELETAL: Denied any pain or swelling in the joints.  NEUROLOGICAL: Denied any numbness,  weakness or tremors.  PSYCHIATRIC: Denies any anxiety, insomnia or bipolar disorder.   PAST MEDICAL HISTORY: She had A. fib, CHF, chronic kidney disease, diabetes, hypertension, high cholesterol.   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   HAD SEVERE REACTION TO SULFA DRUGS, CAUSED SJ SYNDROME AND HAD TO GET ADMITTED TO HOSPITAL FOR 10 DAYS LAST SUMMER.   SOCIAL HISTORY: Does not smoke. No alcohol use and no drugs. Lives at home. Walks without any support.   FAMILY HISTORY: Mother had angina and bypass at 79 years of age.   HOME MEDICATIONS:  1.  Amiodarone 200 mg once a day.  2.  Amitriptyline 10 mg once a day.  3.  Atorvastatin 40 mg once a day.  4.  Calcium carbonate 500 mg oral tablet 3 times a day.  5.  Eliquis 500 mg 2 times a day.  6.  Estring 2 mg vaginal ring every 3 months.  7.  Lisinopril 20 mg once a day.  8.  Metformin 1000 mg oral tablet 2 times a day.  9.  Metoprolol 50 mg once a day.  10.  Nexium 40 mg once a day.  11.  NovoLog 25 units subcutaneous once a day and 18 units in the evening. 12.  Tamsulosin 0.4 mg once a day.   PHYSICAL EXAMINATION: VITAL SIGNS: In ER, temperature 98.9, pulse 78, respirations 20, blood pressure 145/70 and pulse ox 95% on room air.  GENERAL: A 81 year old female who is appropriate for her age, fully alert and oriented to time, place and person and cooperative with history taking and  physical examination.  HEENT: Head and neck atraumatic. Conjunctivae pink. Oral mucosa moist.  NECK: Supple. No JVD.  RESPIRATORY: Bilateral clear and equal air entry.  CARDIOVASCULAR: S1, S2 present, regular. No murmur.  ABDOMEN: Soft, nontender. Bowel sounds present. No organomegaly.  SKIN: No rashes.  MUSCULOSKELETAL: Legs: No edema. Joints: No tenderness or swelling.  NEUROLOGICAL: Power 5/5. Follows commands.  PSYCHIATRIC: Does not appear in any acute psychiatric illness at this time.   LABORATORY, DIAGNOSTIC AND  RADIOLOGICAL DATA: Glucose 148, BUN 27, creatinine 1.34, sodium 139, potassium 4.6, chloride 108, CO2 of 24, calcium 9.6. Liver function tests are within normal range. Troponin less than 0.02. White cell count 5.1, hemoglobin 11.1 and platelet count 191. Chest x-ray, PA and lateral, shows no evidence of acute cardiopulmonary abnormality. EKG shows some T wave inversion in anterior chest leads, V1 and V2.   ASSESSMENT AND PLAN: A 81 year old female with past medical history of congestive heart failure, atrial fibrillation, hypertension, hyperlipidemia, chronic kidney disease, presented to Emergency Room with having chest pain which was with exertion and lasted for 10 minutes, relieved after rest.  1.  Chest pain: Will monitor her on telemetry for further work-up, as her symptoms are very typical for cardiac pain and she has extensive cardiac history. Will call cardiology consult. Will do stress test and echocardiogram tomorrow morning. Continue her beta blocker and aspirin meanwhile. 2.  Atrial fibrillation: This is chronic. Currently, she is not in atrial fibrillation but because of high risk, she is on Eliquis and amiodarone, beta blocker, by cardiologist at [facility]. Will continue the same over here and wait for cardiology consult.  3.  Hypertension: Continue beta blocker and lisinopril.  4.  Diabetes: Will not give metformin due to she has been "nothing by mouth" for stress test tomorrow morning, but keep on insulin sliding scale coverage.  5.  Hyperlipidemia: Continue atorvastatin.  CODE STATUS: Full code.   TOTAL TIME SPENT: 50 minutes in this admission.    ____________________________ Hope Pigeon Elisabeth Pigeon, MD vgv:jm D: 07/07/2013 19:45:04 ET T: 07/07/2013 21:02:44 ET JOB#: [id number]  cc: Hope Pigeon. Elisabeth Pigeon, MD, <Dictator> Altamese Dilling MD ELECTRONICALLY SIGNED 07/18/2013 11:16

## 2015-03-29 ENCOUNTER — Ambulatory Visit
Admit: 2015-03-29 | Disposition: A | Discharge status: Home / Self Care | Payer: Self-pay | Attending: Infectious Diseases | Admitting: Infectious Diseases

## 2015-03-31 NOTE — Consult Note (Signed)
Brief Consult Note: Diagnosis: RLE hematoma on anticoagulation.   Patient was seen by consultant.   Recommend further assessment or treatment.   Comments: No acute indication for operative intervention., will follow.  Electronic Signatures: Natale LayBird, Maxton Noreen (MD)  (Signed 09-Feb-16 14:32)  Authored: Brief Consult Note   Last Updated: 09-Feb-16 14:32 by Natale LayBird, Teng Decou (MD)

## 2015-03-31 NOTE — Discharge Summary (Signed)
PATIENT NAME:  Pamela [name], [name] MR#:  [id number] DATE OF BIRTH:  [birth year]  DATE OF ADMISSION:  01/07/2015 DATE OF DISCHARGE:  01/09/2015  PRESENTING COMPLAINT: Right lower extremity swelling and pain.   DISCHARGE DIAGNOSES: 1.  Acute posthemorrhagic anemia secondary to acute hematoma of right lower extremity. The patient was on Eliquis, now off it.  2.  Hypertension.  3.  Type 2 diabetes.  4.  Acute on chronic renal failure secondary to dehydration, improved.   CODE STATUS: Full code.   MEDICATIONS AT DISCHARGE: 1.  Amiodarone 200 mg daily.  2.  Atorvastatin 40 mg at bedtime.  3.  Lisinopril 20 mg daily.  4.  Metoprolol 50 mg b.i.d.  5.  NovoLog 70/30 with 18 units in the evening and 20 units in the morning.  6.  Tamsulosin 0.4 mg at bedtime.  7.  Amitriptyline 50 mg at bedtime.  8.  Protonix 40 mg daily.  9.  Ferrous sulfate 325 daily.  10.   Calcium carbonate 500 mg 1 tablet daily.  11.   Mag-Ox 1 tablet daily.  12.   Cefuroxime 500 mg b.i.d. for 4 more days.  13.   Tylenol 325 two tablets every 4 hours as needed.  14.   Acetaminophen/hydrocodone 325/5, one tablet every 4 hours as needed.  15.   Docusate 100 mg b.i.d.  16.   Patient advised not to take Eliquis.    DISCHARGE INSTRUCTIONS:  1.  Physical therapy at rehabilitation.   2.  Follow up with Dr. Excell Seltzerooper, surgery, Monday 15th of February.  3.  Follow up with Dr. Sampson GoonFitzgerald, primary care physician, in 1 to 2 weeks.   LABORATORY DATA: Hemoglobin at discharge is 7.9. Creatinine is 1.33. CT of the tibia, lower right leg shows no acute osseous injury. An 8.3 x 4.2 x 11.5 cm hematoma along the anterolateral aspect of the proximal right lower leg at the level of proximal right fibular metadiaphysis. Generalized soft tissue edema within the subcutaneous fat of the right lower leg which is likely reactive versus secondary to cellulitis. Blood cultures negative in 48 hours. White count is 7.8. Creatinine on admission was 2.28.    Surgery consultation with Dr. Natale LayMark Bird, recommends close followup as an outpatient.   HOSPITAL COURSE: The patient is a 81 year old Caucasian female with history of chronic atrial fibrillation on Eliquis, hypercholesterolemia, gastroesophageal reflux disease, diabetes, hypertension, comes in with:  1.  Acute hematoma, right lower extremity secondary to status post fall at home. Was on Eliquis. She had significant hematoma with pain. She was seen by Dr. Egbert GaribaldiBird from surgery who recommends close followup. Physical therapy saw patient and recommends rehabilitation. She will discontinue her Eliquis. The patient understands the risk of CVA.   2.  Acute on chronic renal failure appears secondary to dehydration. Creatinine on admission was 2.2. Weight at discharge is 1.33. She got IV fluids for hydration.  3.  Acute posthemorrhagic anemia status post 1 unit of blood transfusion. The patient came in with hemoglobin around 8.4 to 9. It dropped down to 7.0 secondary to acute hematoma and received 1 unit blood transfusion. Hemoglobin is up to 7.9. She has history of chronic anemia. Her iron supplements have been continued.  4.  Type 2 diabetes. Resume NovoLog 70/30 along with sliding scale, and her metformin dose was decreased to 500 mg p.o. once a day.  5.  History of chronic lower extremity edema along with diabetic foot. Follows up with Dr. Orland Jarredroxler. He had recently started  her on Ceftin. She has 4 more days to complete the treatment.  6.  Deep vein thrombosis prophylaxis. The patient was kept on TEDs and SCDs. No antiplatelet agents were given in the setting of acute hematoma.   Overall, hospital stay otherwise remained stable. She remained a full code.   TIME SPENT: 40 minutes.    ____________________________ Rachelle Hora, MD mge:at D: 01/09/2015 10:28:27 ET T: 01/09/2015 10:47:29 ET JOB#: [id number]  cc: Rachelle Hora, MD, <Dictator> Richard E. Excell Seltzer, MD Stann Mainland. Sampson Goon, MD

## 2015-03-31 NOTE — H&P (Signed)
PATIENT NAME:  Pamela [name], Pamela [name] MR#:  [id number] DATE OF BIRTH:  1934-08-01  DATE OF ADMISSION:  01/07/2015  PRIMARY CARE PHYSICIAN:  Dr. Sampson Goon.   CHIEF COMPLAINT:  Right lower extremity swelling and pain after a mechanical fall on Saturday.   HISTORY OF PRESENT ILLNESS:  Pamela [name] is a very pleasant Caucasian female, well known to our service from previous admission, has history of atrial fibrillation on chronic anticoagulation with Eliquis, CKD stage II, hypertension, hyperlipidemia, and diabetes, comes into the Emergency Room after she had a mechanical fall on Saturday and started noticing over the weekend that her right lower extremity was very tender, swollen, and was extremely bruised. The patient was found to have a significant sized hematoma around her tibia-fibula. She also was found to be dehydrated. Her creatinine is 2.28, baseline creatinine is 1.1. She is being admitted with an acute hematoma right lower extremity, acute renal failure, and status post mechanical fall.   PAST MEDICAL HISTORY:  1.  Chronic atrial fibrillation on Coumadin.  2.  History of CHF.   3.  CKD stage II, baseline creatinine 1.16 in 2014.  4.  Diabetes.  5.  Hyperlipidemia.  6.  Hypertension.  7.  Chronic leg swelling along with edema and cellulitis currently on cefuroxime, for that she follows up with Dr. Orland Jarred.   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   ALLERGIES: SULFA CAUSES STEVENS-JOHNSON SYNDROME. HER OTHER ALLERGIES INCLUDE CIPRO, HYZAAR, LEVAQUIN, AND BACTRIM.   SOCIAL HISTORY: Does not smoke. No alcohol or any drug use. Lives at home, walks with a walker and/or a cane.    FAMILY HISTORY: Mother had coronary artery disease at age 64.   CURRENT MEDICATIONS:  1. Tamsulosin 0.4 mg p.o. daily at bedtime.  2. Protonix 40 mg daily.  3. NovoLog 70/30, 20 units daily in the morning and 18 units in the evening.  4. Metoprolol tartrate 50 mg 1 tablet b.i.d.   5. Metformin 1000 mg 1 tablet b.i.d.  6. Magnesium oxide 1 tablet daily.  7. Lisinopril 20 mg daily.  8. Ferrous sulfate 325 mg p.o. daily.  9. Eliquis 5 mg b.i.d.  10. Cefuroxime 500 mg b.i.d., this is for her lower extremity cellulitis, it was started about 5 days ago.  11. Calcium carbonate 500 mg 1 tablet p.o. daily.  12. Atorvastatin 40 mg p.o. daily.  13. Amitriptyline 50 mg daily at bedtime.  14. Amiodarone 200 mg p.o. daily.   REVIEW OF SYSTEMS:    CONSTITUTIONAL: Positive for fatigue, weakness.  EYES: No blurred or double vision, glaucoma.  EARS, NOSE, AND THROAT: No tinnitus, ear pain, epistaxis.  RESPIRATORY: No cough, wheeze, hemoptysis, or shortness of breath.  CARDIOVASCULAR: No chest pain, orthopnea, edema. Positive for hypertension.  GASTROINTESTINAL: No nausea, vomiting, diarrhea, abdominal pain.  GENITOURINARY: No dysuria, hematuria, or frequency.  ENDOCRINE: No polyuria, nocturia, or thyroid problems.  HEMATOLOGY: No anemia. Positive for bruising.  SKIN: No acne, rash. Positive for some bruises.  MUSCULOSKELETAL: Positive for arthritis. No swelling.  No gout.  NEUROLOGIC: No CVA, TIA, dysarthria, or dementia.  PSYCHIATRIC: No anxiety or depression.   All other systems reviewed and negative.   PHYSICAL EXAMINATION:  GENERAL: The patient is awake, alert, oriented x 3, not in acute distress.  VITAL SIGNS: Afebrile. Pulse is 72, blood pressure is 160/98. HEENT: Atraumatic, normocephalic. Pupils PERRLA.  EOM intact. Oral mucosa is moist.  NECK: Supple. No JVD. No carotid bruit.  LUNGS: Clear to auscultation  bilaterally. No rales, rhonchi, respiratory distress, or labored breathing.  CARDIOVASCULAR: Both heart sounds are normal. Rate and rhythm regular. PMI not lateralized. Chest nontender.  EXTREMITIES: Good pedal pulses, good femoral pulses. There is 2 + pitting edema in both lower extremities. The patient does have a large hematoma on the right tibial shin along  with bruising. She does have some minimal cellulitis, redness of her right lower extremity with 2 + pitting edema. She also has ecchymosis over the right elbow with some swelling and scab which is healing at the elbow.  NEUROLOGIC:  Grossly intact cranial nerves II through XII. No motor or sensory deficit.  PSYCHIATRIC: The patient is awake, alert, oriented x 3.   LABORATORY DATA: White count is 7.8, H and H is 8.4 and 26.7, platelet count is 166,000. Creatinine is 2.28, potassium is 5.2, chloride is 108. CT of the tibia and lower extremity on the right shows 8 x 4 x 11 cm hematoma on the anterolateral aspect of the proximal right lower leg at the level of the proximal tibia-fibular metaphysis, generalized soft tissue edema within the subcutaneous fat of the right lower leg which is reactive versus secondary cellulitis.   ASSESSMENT: A 81 year old, Pamela [name] with history of chronic atrial fibrillation on Coumadin, hypercholesterolemia, gastroesophageal reflux disease, diabetes, hypertension, comes in with:   1.  Acute hematoma in the right lower extremity secondary to status post fall at home, the patient on Eliquis. She has a significant hematoma with pain and bruising. We will give p.r.n. Tylenol along with p.o. Norco as needed for pain control. Physical therapy will see the patient for gait, ambulation, and training. We will also have surgery see the patient and evaluate her CT scan. Will hold off on her Eliquis, monitor hemoglobin closely.  2.  Acute renal failure, appears secondary to dehydration. The patient's creatinine baseline is around 1.16.  We will give her IV fluids. She came in with creatinine of 2.28, follow metabolic panel, avoid nephrotoxins, monitor Is and Os.  3.  Hyperlipidemia. Continue atorvastatin.  4.  History on amiodarone and metoprolol which will continue.  I will hold off on Eliquis since the patient has hematoma.  5.  Type 2 diabetes. Resume NovoLog at 70/30 along with  sliding scale.  6.  Gastroesophageal reflux disease. Continue Protonix.   7.  History of chronic anemia. Continue iron supplementation.   8.  History of chronic lower extremity edema along with diabetic foot. The patient recently was started on Ceftin by Dr. Orland Jarredroxler, we will continue, she has 5 more days to go.   9.  Deep vein thrombosis prophylaxis. TEDs and SCDs. The patient has hematoma, will avoid any antiplatelet agents and hold off on Coumadin and Eliquis at this time.   Above was discussed with the patient, the patient's husband who was present in the room.   TIME SPENT: 50 minutes.     ____________________________ Wylie HailSona A. Allena KatzPatel, MD sap:bu D: 01/07/2015 13:16:50 ET T: 01/07/2015 13:56:48 ET JOB#: [id number]  cc: Jamarie Joplin A. Allena KatzPatel, MD, <Dictator> Stann Mainlandavid P. Sampson GoonFitzgerald, MD Rhona RaiderMatthew G. Troxler, DPM  Willow OraSONA A Brandolyn Shortridge MD ELECTRONICALLY SIGNED 01/29/2015 17:27

## 2015-03-31 NOTE — Discharge Summary (Signed)
PATIENT NAME:  Pamela [name], Elenor P MR#:  [id number] DATE OF BIRTH:  1934/11/06  DATE OF ADMISSION:  01/07/2015 DATE OF DISCHARGE:  01/09/2015  PRESENTING COMPLAINT: Right lower extremity swelling and pain.   DISCHARGE DIAGNOSES: 1. Acute posthemorrhagic anemia secondary to acute hematoma of right lower extremity. The patient was on Eliquis, now off it.  2. Hypertension.  3. Type 2 diabetes.  4. Acute on chronic renal failure secondary to dehydration, improved.   CODE STATUS: Full code.   MEDICATIONS AT DISCHARGE: 1.  Amiodarone 200 mg daily.  2.  Atorvastatin 40 mg at bedtime.  3.  Lisinopril 20 mg daily.  4.  Metoprolol 50 mg b.i.d.  5.  NovoLog 70/30 with 18 units in the evening and 20 units in the morning.  6.  Tamsulosin 0.4 mg at bedtime.  7.  Amitriptyline 50 mg at bedtime.  8.  Protonix 40 mg daily.  9.  Ferrous sulfate 325 daily.  10.   Calcium carbonate 500 mg 1 tablet daily.  11.   Mag-Ox 1 tablet daily.  12.   Cefuroxime 500 mg b.i.d. for 4 more days.  13.   Tylenol 325 two tablets every 4 hours as needed.  14.   Acetaminophen/hydrocodone 325/5, one tablet every 4 hours as needed.  15.   Docusate 100 mg b.i.d.  16.   Patient advised not to take Eliquis.    DISCHARGE INSTRUCTIONS:  1.  Physical therapy at rehabilitation.   2.  Follow up with Dr. Excell Seltzerooper, surgery, Monday 15th of February.  3.  Follow up with Dr. Sampson GoonFitzgerald, primary care physician, in 1 to 2 weeks.   LABORATORY DATA: Hemoglobin at discharge is 7.9. Creatinine is 1.33. CT of the tibia, lower right leg shows no acute osseous injury. An 8.3 x 4.2 x 11.5 cm hematoma along the anterolateral aspect of the proximal right lower leg at the level of proximal right fibular metadiaphysis. Generalized soft tissue edema within the subcutaneous fat of the right lower leg which is likely reactive versus secondary to cellulitis. Blood cultures negative in 48 hours. White count is 7.8. Creatinine on admission was 2.28.    Surgery consultation with Dr. Natale LayMark Bird, recommends close followup as an outpatient.   HOSPITAL COURSE: The patient is a 81 year old Caucasian female with history of chronic atrial fibrillation on Eliquis, hypercholesterolemia, gastroesophageal reflux disease, diabetes, hypertension, comes in with:  1.  Acute hematoma, right lower extremity secondary to status post fall at home. Was on Eliquis. She had significant hematoma with pain. She was seen by Dr. Egbert GaribaldiBird from surgery who recommends close followup. Physical therapy saw patient and recommends rehabilitation. She will discontinue her Eliquis. The patient understands the risk of CVA.   2.  Acute on chronic renal failure appears secondary to dehydration. Creatinine on admission was 2.2. Weight at discharge is 1.33. She got IV fluids for hydration.  3.  Acute posthemorrhagic anemia status post 1 unit of blood transfusion. The patient came in with hemoglobin around 8.4 to 9. It dropped down to 7.0 secondary to acute hematoma and received 1 unit blood transfusion. Hemoglobin is up to 7.9. She has history of chronic anemia. Her iron supplements have been continued.  4.  Type 2 diabetes. Resume NovoLog 70/30 along with sliding scale, and her metformin dose was decreased to 500 mg p.o. once a day.  5.  History of chronic lower extremity edema along with diabetic foot. Follows up with Dr. Orland Jarredroxler. He had recently started her on Ceftin. She  has 4 more days to complete the treatment.  6.  Deep vein thrombosis prophylaxis. The patient was kept on TEDs and SCDs. No antiplatelet agents were given in the setting of acute hematoma.   Overall, hospital stay otherwise remained stable. She remained a full code.   TIME SPENT: 40 minutes.   ____________________________ Wylie Hail Allena Katz, MD sap:at D: 01/09/2015 10:28:00 ET T: 01/09/2015 10:47:29 ET JOB#: [id number]  cc: Adah Salvage. Excell Seltzer, MD Stann Mainland. Sampson Goon, MD Akhilesh Sassone A. Allena Katz, MD, <Dictator>     Willow Ora  MD ELECTRONICALLY SIGNED 01/29/2015 17:27

## 2015-05-08 DIAGNOSIS — R279 Unspecified lack of coordination: Secondary | ICD-10-CM | POA: Insufficient documentation

## 2015-05-08 DIAGNOSIS — R270 Ataxia, unspecified: Secondary | ICD-10-CM

## 2015-05-08 HISTORY — DX: Ataxia, unspecified: R27.0

## 2015-06-15 ENCOUNTER — Ambulatory Visit
Admission: EM | Admit: 2015-06-15 | Discharge: 2015-06-15 | Disposition: A | Discharge status: Home / Self Care | Payer: Medicare Other | Attending: Family Medicine | Admitting: Family Medicine

## 2015-06-15 DIAGNOSIS — R350 Frequency of micturition: Secondary | ICD-10-CM | POA: Diagnosis present

## 2015-06-15 DIAGNOSIS — N390 Urinary tract infection, site not specified: Secondary | ICD-10-CM

## 2015-06-15 DIAGNOSIS — R300 Dysuria: Secondary | ICD-10-CM | POA: Diagnosis present

## 2015-06-15 HISTORY — DX: Pure hypercholesterolemia, unspecified: E78.00

## 2015-06-15 LAB — URINALYSIS COMPLETE WITH MICROSCOPIC (ARMC ONLY)
Bilirubin Urine: NEGATIVE
Glucose, UA: NEGATIVE mg/dL
Nitrite: NEGATIVE
Protein, ur: >300 mg/dL — AB
Specific Gravity, Urine: 1.025 (ref 1.005–1.030)
pH: 6 (ref 5.0–8.0)

## 2015-06-15 MED ORDER — AMOXICILLIN-POT CLAVULANATE 500-125 MG PO TABS: 1.0000 | ORAL_TABLET | Freq: Three times a day (TID) | ORAL | Status: DC

## 2015-06-15 NOTE — ED Provider Notes (Signed)
CSN: [id number]     Arrival date & time 06/15/15  1319 History   First MD Initiated Contact with Patient 06/15/15 1357     Chief Complaint  Patient presents with  . Urinary Frequency    Reports 2 weeks of urinary frequency and burning with urination. Denies back pain or blood in urine, but sx consistent with previous UTIs. Has been feeling tired.    (Consider location/radiation/quality/duration/timing/severity/associated sxs/prior Treatment) HPI Comments: Patient states allergic to sulfa drugs and intolerant to cipro. H/o renal insufficiency.  Patient is a 81 y.o. female presenting with dysuria. The history is provided by the patient.  Dysuria Pain quality:  Burning Pain severity:  Mild Onset quality:  Sudden Duration:  2 weeks Timing:  Constant Progression:  Waxing and waning Chronicity:  New Recent urinary tract infections: no   Ineffective treatments:  None tried Urinary symptoms: frequent urination and incontinence   Associated symptoms: no abdominal pain, no fever, no flank pain and no vomiting     Past Medical History  Diagnosis Date  . Diabetes mellitus   . Hypertension   . Dysrhythmia   . High cholesterol    Past Surgical History  Procedure Laterality Date  . Toe amputation    . Tonsillectomy    . Abdominal hysterectomy     No family history on file. History  Substance Use Topics  . Smoking status: Never Smoker   . Smokeless tobacco: Not on file  . Alcohol Use: No   OB History    No data available     Review of Systems  Constitutional: Negative for fever.  Gastrointestinal: Negative for vomiting and abdominal pain.  Genitourinary: Positive for dysuria. Negative for flank pain.    Allergies  Sulfa antibiotics  Home Medications   Prior to Admission medications   Medication Sig Start Date End Date Taking? Authorizing Provider  amiodarone (PACERONE) 200 MG tablet Take 2 tablets twice daily for 2 days, then take 2 tablets once daily for 7 days, then  take 1 tablet once daily till seen by heart doctor 05/23/12  Yes Osvaldo ShipperGokul Krishnan, MD  amitriptyline (ELAVIL) 75 MG tablet Take 1 tablet (75 mg total) by mouth at bedtime. MAY RESUME IN 3 DAYS, NO EARLIER Patient taking differently: 50 mg at bedtime. MAY RESUME IN 3 DAYS, NO EARLIER 05/23/12  Yes Osvaldo ShipperGokul Krishnan, MD  atorvastatin (LIPITOR) 40 MG tablet Take 40 mg by mouth daily.   Yes Historical Provider, MD  calcium carbonate (OS-CAL - DOSED IN MG OF ELEMENTAL CALCIUM) 1250 MG tablet Take 1 tablet (500 mg of elemental calcium total) by mouth 3 (three) times daily. 05/23/12 06/15/15 Yes Osvaldo ShipperGokul Krishnan, MD  lisinopril (PRINIVIL,ZESTRIL) 20 MG tablet Take 20 mg by mouth daily.   Yes Historical Provider, MD  metFORMIN (GLUCOPHAGE) 1000 MG tablet Take 1,000 mg by mouth 2 (two) times daily.   Yes Historical Provider, MD  metoprolol (LOPRESSOR) 50 MG tablet Take 1 tablet (50 mg total) by mouth 2 (two) times daily. 05/23/12  Yes Osvaldo ShipperGokul Krishnan, MD  pantoprazole (PROTONIX) 40 MG tablet Take 40 mg by mouth daily.   Yes Historical Provider, MD  Tamsulosin HCl (FLOMAX) 0.4 MG CAPS Take 0.4 mg by mouth daily.   Yes Historical Provider, MD  amoxicillin-clavulanate (AUGMENTIN) 500-125 MG per tablet Take 1 tablet (500 mg total) by mouth 3 (three) times daily. 06/15/15   Payton Mccallumrlando Kourtnei Rauber, MD  apixaban (ELIQUIS) 5 MG TABS tablet Take 1 tablet (5 mg total) by mouth 2 (two) times  daily. 05/23/12   Osvaldo Shipper, MD  esomeprazole (NEXIUM) 40 MG capsule Take 40 mg by mouth daily before breakfast.    Historical Provider, MD  furosemide (LASIX) 40 MG tablet TAKE 1 TABLET DAILY FOR 5 DAYS AND THEN AS NEEDED FOR LEG SWELLING 05/23/12   Osvaldo Shipper, MD  metoCLOPramide (REGLAN) 5 MG tablet Take 1 tablet (5 mg total) by mouth 4 (four) times daily -  before meals and at bedtime. 05/23/12 06/02/12  Osvaldo Shipper, MD  nabumetone (RELAFEN) 500 MG tablet Take 500 mg by mouth 2 (two) times daily as needed. Pain    Historical Provider, MD   potassium chloride SA (K-DUR,KLOR-CON) 20 MEQ tablet 1 TABLET DAILY FOR 5 DAYS AND THEN TAKE ONLY WHEN YOU TAKE LASIX 05/23/12   Osvaldo Shipper, MD   BP 161/62 mmHg  Pulse 53  Temp(Src) 98.4 F (36.9 C) (Tympanic)  Resp 16  Ht  (1.626 m)  Wt 174 lb (78.926 kg)  BMI 29.85 kg/m2  SpO2 97% Physical Exam  Constitutional: She appears well-developed and well-nourished. No distress.  Abdominal: Soft. Bowel sounds are normal. She exhibits no distension and no mass. There is no tenderness. There is no rebound and no guarding.  Skin: She is not diaphoretic.  Nursing note and vitals reviewed.   ED Course  Procedures (including critical care time) Labs Review Labs Reviewed  URINALYSIS COMPLETEWITH MICROSCOPIC ([facility] ONLY) - Abnormal; Notable for the following:    APPearance CLOUDY (*)    Ketones, ur TRACE (*)    Hgb urine dipstick 3+ (*)    Protein, ur >300 (*)    Leukocytes, UA 3+ (*)    Bacteria, UA MANY (*)    Squamous Epithelial / LPF 0-5 (*)    All other components within normal limits  URINE CULTURE    Imaging Review No results found.   MDM   1. UTI (lower urinary tract infection)    Discharge Medication List as of 06/15/2015  2:35 PM    START taking these medications   Details  amoxicillin-clavulanate (AUGMENTIN) 500-125 MG per tablet Take 1 tablet (500 mg total) by mouth 3 (three) times daily., Starting 06/15/2015, Until Discontinued, Normal        Plan: 1. diagnosis reviewed with patient 2. rx as per orders; risks, benefits, potential side effects reviewed with patient 3. Recommend supportive treatment with increased fluids  4. F/u prn if symptoms worsen or don't improve   Payton Mccallum, MD 06/15/15 [phone]

## 2015-06-19 LAB — URINE CULTURE: Culture: >=100000

## 2015-07-23 ENCOUNTER — Encounter: Payer: Self-pay | Admitting: *Deleted

## 2015-07-30 ENCOUNTER — Ambulatory Visit: Payer: Self-pay | Admitting: Urology

## 2015-08-08 ENCOUNTER — Ambulatory Visit (INDEPENDENT_AMBULATORY_CARE_PROVIDER_SITE_OTHER): Payer: Medicare Other | Admitting: Urology

## 2015-08-08 ENCOUNTER — Encounter: Payer: Self-pay | Admitting: Urology

## 2015-08-08 VITALS — BP 154/81 | HR 71 | Ht 65.0 in | Wt 173.8 lb

## 2015-08-08 DIAGNOSIS — N952 Postmenopausal atrophic vaginitis: Secondary | ICD-10-CM

## 2015-08-08 DIAGNOSIS — W19XXXA Unspecified fall, initial encounter: Secondary | ICD-10-CM

## 2015-08-08 DIAGNOSIS — N390 Urinary tract infection, site not specified: Secondary | ICD-10-CM

## 2015-08-08 LAB — URINALYSIS, COMPLETE
BILIRUBIN UA: NEGATIVE
Glucose, UA: NEGATIVE
Ketones, UA: NEGATIVE
NITRITE UA: POSITIVE — AB
PH UA: 5.5 (ref 5.0–7.5)
Specific Gravity, UA: 1.01 (ref 1.005–1.030)
UUROB: 0.2 mg/dL (ref 0.2–1.0)

## 2015-08-08 LAB — MICROSCOPIC EXAMINATION

## 2015-08-08 LAB — BLADDER SCAN AMB NON-IMAGING: SCAN RESULT: 135

## 2015-08-08 MED ORDER — AMOXICILLIN-POT CLAVULANATE 500-125 MG PO TABS: 1.0000 | ORAL_TABLET | Freq: Three times a day (TID) | ORAL | Status: DC

## 2015-08-08 NOTE — Progress Notes (Signed)
08/08/2015 7:41 PM   Pamela [name] 1934/11/07 [id number]  Referring provider: [name], MD [address] [place], [place] [address]  Chief Complaint  Patient presents with  . Establish Care    old patient from [facility]   . Urinary Tract Infection    urinary frequency    HPI: Patient is a 81 year old white female who presents today to establish care with a local urologist since her former urologist has relocated. She was being seen and treated for recurrent urinary tract infections.    Today, she is having the symptoms of urinary tract infection. She states she is feeling horrible and has no energy. She is also noted an increase in her urinary frequency and urgency.  Her UA today is suspicious for infection.  Her baseline symptomatology are nocturia, mild urinary leakage, intermittency, hesitancy, a weak urinary stream, frequent urination and urgency.  She underwent renal ultrasound and cystoscopic examination last year and no abnormalities were identified. She is currently on tamsulosin 0.4 mg 1 capsule daily.  She denies any gross hematuria, dysuria or suprapubic pain. She also denies any fevers, chills, nausea or vomiting.  Of note, patient admitted that she fell in our parking lot and hit her head.  She is not experiencing any head pain nor confusion. She is not currently on blood thinners.   PMH: Past Medical History  Diagnosis Date  . Diabetes mellitus   . Hypertension   . Dysrhythmia   . High cholesterol   . A-fib 08/28/2014    Overview:  Overview:  Diastolic dysfunction on ECHO 04/2012-done at [facility]   . Abnormal gait 02/08/2012  . Abscess or cellulitis of leg 04/18/2012  . Absence of bladder continence 07/29/2012  . Acid reflux 02/08/2012  . Acute kidney failure 05/15/2012  . Afib, paroxysmal, with RVR at times with BBB 05/13/2012  . Allergic drug rash 05/15/2012  . Anemia 05/13/2012  . 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 Benign essential HTN 02/08/2012  .  Bladder infection, chronic 06/29/2013  . BP (high blood pressure) 05/12/2012  . Bursitis of knee 08/07/2014  . Cellulitis in diabetic foot 05/12/2012  . Chronic kidney disease 06/01/2012    Overview:  Overview:   12/2012- Renal US, simple L cyst. UIEP normal   . Clinical depression 08/28/2014  . CN (constipation) 03/17/2012  . Dermatitis due to drug reaction 05/15/2012  . Diastolic dysfunction, left ventricle 05/14/2012  . DM (diabetes mellitus) 05/12/2012  . Edema leg 07/02/2014  . Enthesopathy of knee 08/07/2014  . Excess fluid volume 05/20/2012  . FOM (frequency of micturition) 07/29/2012  . Frank hematuria 09/27/2014  . HTN (hypertension) 05/12/2012  . Hypercholesterolemia 05/12/2012  . Incomplete bladder emptying 07/29/2012  . Open wound of knee, leg (except thigh), and ankle 06/01/2012  . Stevens-Johnson syndrome 05/15/2012    Overview:  Overview:  Several years ago, allergic reaction to sulfa antibiotic Several years ago, allergic reaction to sulfa antibiotic   . Gangrene     Surgical History: Past Surgical History  Procedure Laterality Date  . Toe amputation    . Tonsillectomy    . Abdominal hysterectomy    . Cataract surgery    . Endovenous ablation saphenous vein w/ laser      Home Medications:    Medication List       This list is accurate as of: 08/08/15 11:59 PM.  Always use your most recent med list.  ALIGN 4 MG Caps  Take by mouth.     amiodarone 200 MG tablet  Commonly known as:  PACERONE  Take 2 tablets twice daily for 2 days, then take 2 tablets once daily for 7 days, then take 1 tablet once daily till seen by heart doctor     amitriptyline 75 MG tablet  Commonly known as:  ELAVIL  Take 1 tablet (75 mg total) by mouth at bedtime. MAY RESUME IN 3 DAYS, NO EARLIER     amoxicillin-clavulanate 500-125 MG per tablet  Commonly known as:  AUGMENTIN  Take 1 tablet (500 mg total) by mouth 3 (three) times daily.     apixaban 5 MG Tabs tablet  Commonly known as:   ELIQUIS  Take 1 tablet (5 mg total) by mouth 2 (two) times daily.     atorvastatin 40 MG tablet  Commonly known as:  LIPITOR  Take 40 mg by mouth daily.     calcium carbonate 1250 (500 CA) MG tablet  Commonly known as:  OS-CAL - dosed in mg of elemental calcium  Take 1 tablet (500 mg of elemental calcium total) by mouth 3 (three) times daily.     esomeprazole 40 MG capsule  Commonly known as:  NEXIUM  Take 40 mg by mouth daily before breakfast.     furosemide 40 MG tablet  Commonly known as:  LASIX  TAKE 1 TABLET DAILY FOR 5 DAYS AND THEN AS NEEDED FOR LEG SWELLING     glucose blood test strip     HUMALOG MIX 75/25 (75-25) 100 UNIT/ML Susp injection  Generic drug:  insulin lispro protamine-lispro     LEADER INSULIN SYRINGE 30G X 5/16" 0.5 ML Misc  Generic drug:  Insulin Syringe-Needle U-100  Use as directed.     lisinopril 20 MG tablet  Commonly known as:  PRINIVIL,ZESTRIL  Take 20 mg by mouth daily.     metFORMIN 1000 MG tablet  Commonly known as:  GLUCOPHAGE  Take 1,000 mg by mouth 2 (two) times daily.     metoCLOPramide 5 MG tablet  Commonly known as:  REGLAN  Take 1 tablet (5 mg total) by mouth 4 (four) times daily -  before meals and at bedtime.     metoprolol 50 MG tablet  Commonly known as:  LOPRESSOR  Take 1 tablet (50 mg total) by mouth 2 (two) times daily.     nabumetone 500 MG tablet  Commonly known as:  RELAFEN  Take 500 mg by mouth 2 (two) times daily as needed. Pain     NOVOLOG MIX 70/30 FLEXPEN (70-30) 100 UNIT/ML FlexPen  Generic drug:  insulin aspart protamine - aspart  INJECT 20 UNITS SUBQ EVERY MORNING AND 18 UNITS DAILY AT 5PM     pantoprazole 40 MG tablet  Commonly known as:  PROTONIX  Take 40 mg by mouth daily.     PHARMACIST CHOICE LANCETS Misc  USE 2 TIMES DAILY FOR DIABETIC TESTING     polyethylene glycol packet  Commonly known as:  MIRALAX / GLYCOLAX  Take by mouth.     potassium chloride SA 20 MEQ tablet  Commonly known as:   K-DUR,KLOR-CON  1 TABLET DAILY FOR 5 DAYS AND THEN TAKE ONLY WHEN YOU TAKE LASIX     tamsulosin 0.4 MG Caps capsule  Commonly known as:  FLOMAX  Take 0.4 mg by mouth daily.        Allergies:  Allergies  Allergen Reactions  . Ciprofloxacin Anaphylaxis  .  Sulfamethoxazole-Trimethoprim Other (See Comments) and Rash    Went into [employer].  . Levofloxacin Other (See Comments)  . Losartan Potassium-Hctz Other (See Comments)    Other reaction(s): Unknown  . Nitrofurantoin Other (See Comments)  . Sulfa Antibiotics Other (See Comments)    Levonne Spiller Steven's Johnsons  . Sulfasalazine     unknown    Family History: Family History  Problem Relation Age of Onset  . Kidney disease Neg Hx   . Prostate cancer Neg Hx   . Bladder Cancer Neg Hx   . Diabetes Mellitus II Mother   . Diabetes Paternal Grandmother   . Diabetes Maternal Grandmother   . Lung cancer Father   . Heart disease Mother     Social History:  reports that she has never smoked. She does not have any smokeless tobacco history on file. She reports that she does not drink alcohol or use illicit drugs.  ROS: UROLOGY Frequent Urination?: Yes Hard to postpone urination?: Yes Burning/pain with urination?: No Get up at night to urinate?: Yes Leakage of urine?: Yes Urine stream starts and stops?: Yes Trouble starting stream?: Yes Do you have to strain to urinate?: No Blood in urine?: No Urinary tract infection?: Yes Sexually transmitted disease?: No Injury to kidneys or bladder?: No Painful intercourse?: No Weak stream?: Yes Currently pregnant?: No Vaginal bleeding?: No Last menstrual period?: n  Gastrointestinal Nausea?: No Vomiting?: No Indigestion/heartburn?: Yes Diarrhea?: No Constipation?: Yes  Constitutional Fever: No Night sweats?: No Weight loss?: No Fatigue?: Yes  Skin Skin rash/lesions?: No Itching?: No  Eyes Blurred vision?: No Double vision?:  No  Ears/Nose/Throat Sore throat?: No Sinus problems?: No  Hematologic/Lymphatic Swollen glands?: No Easy bruising?: Yes  Cardiovascular Leg swelling?: No Chest pain?: No  Respiratory Cough?: No Shortness of breath?: No  Endocrine Excessive thirst?: Yes  Musculoskeletal Back pain?: No Joint pain?: No  Neurological Headaches?: No Dizziness?: No  Psychologic Depression?: No Anxiety?: No  Physical Exam: BP 154/81 mmHg  Pulse 71  Ht 5\' 5"  (1.651 m)  Wt 173 lb 12.8 oz (78.835 kg)  BMI 28.92 kg/m2  Constitutional:  Alert and oriented, No acute distress. HEENT: [place] AT, moist mucus membranes.  Trachea midline, no masses. No facial bruising.  No head trauma noted. Patient is wearing a thick wig which may have protected her head during her fall. Cardiovascular: No clubbing, cyanosis, or edema. Respiratory: Normal respiratory effort, no increased work of breathing. GI: Abdomen is soft, nontender, nondistended, no abdominal masses GU: No CVA tenderness.  Skin: No rashes, bruises or suspicious lesions. Lymph: No cervical or inguinal adenopathy. Neurologic: Grossly intact, no focal deficits, moving all 4 extremities. Psychiatric: Normal mood and affect.  Laboratory Data: Lab Results  Component Value Date   WBC 7.8 01/07/2015   HGB 8.4* 01/07/2015   HCT 26.7* 01/07/2015   MCV 98 01/07/2015   PLT 166 01/07/2015    Lab Results  Component Value Date   CREATININE 2.28* 01/07/2015   Lab Results  Component Value Date   HGBA1C 7.6* 05/13/2012    Urinalysis: Results for orders placed or performed in visit on 08/08/15  CULTURE, URINE COMPREHENSIVE  Result Value Ref Range   Urine Culture, Comprehensive Final report (A)    Result 1 Comment (A)    Result 2 Proteus mirabilis (A)    ANTIMICROBIAL SUSCEPTIBILITY Comment   Microscopic Examination  Result Value Ref Range   WBC, UA >30 (H) 0 -  5 /hpf   RBC, UA >30 (  H) 0 -  2 /hpf   Epithelial Cells (non renal) >10 (H)  0 - 10 /hpf   Bacteria, UA Many (A) None seen/Few  Urinalysis, Complete  Result Value Ref Range   Specific Gravity, UA 1.010 1.005 - 1.030   pH, UA 5.5 5.0 - 7.5   Color, UA Yellow Yellow   Appearance Ur Cloudy (A) Clear   Leukocytes, UA 3+ (A) Negative   Protein, UA 1+ (A) Negative/Trace   Glucose, UA Negative Negative   Ketones, UA Negative Negative   RBC, UA 2+ (A) Negative   Bilirubin, UA Negative Negative   Urobilinogen, Ur 0.2 0.2 - 1.0 mg/dL   Nitrite, UA Positive (A) Negative   Microscopic Examination See below:   BLADDER SCAN AMB NON-IMAGING  Result Value Ref Range   Scan Result 135     Assessment & Plan:    1. Recurrent UTI:   Patient has a history of recurrent UTI's. Her UA was suspicious for infection today. I have prescribed her Augmentin and I have sent her urine for culture.  We'll adjust antibiotic coverage as appropriate once sensitivities are available.  - Urinalysis, Complete - BLADDER SCAN AMB NON-IMAGING  2. Atrophic vaginitis:   Patient had been on vaginal estrogen cream in the past. I will restart her on this medication. She will return in 2 weeks for vaginal exam and symptom recheck.  3. Fall:   The incident was recorded in the safety zone portal. The patient was encouraged to go to the emergency room, but she declined. She states she falls all the time.  She did stay our office waiting room for over 30 minutes waiting for her husband to pick her up from his doctor's exam.  She did not exhibit any confusion or bruising of her head.  Return in about 2 weeks (around 08/22/2015) for exam.  Michiel Cowboy, [facility]  [facility] [address], [address] [place], [place] [address] [phone]

## 2015-08-11 DIAGNOSIS — N952 Postmenopausal atrophic vaginitis: Secondary | ICD-10-CM | POA: Insufficient documentation

## 2015-08-11 DIAGNOSIS — N390 Urinary tract infection, site not specified: Secondary | ICD-10-CM | POA: Insufficient documentation

## 2015-08-11 LAB — CULTURE, URINE COMPREHENSIVE

## 2015-08-12 ENCOUNTER — Telehealth: Payer: Self-pay

## 2015-08-12 NOTE — Telephone Encounter (Signed)
Spoke with pt in reference to infection. Reinforced with pt to complete abx. Pt stated she is feeling much better thus far.

## 2015-08-12 NOTE — Telephone Encounter (Signed)
-----   Message from Harle Battiest, PA-C sent at 08/11/2015  5:55 PM EDT ----- Patient has a +UCx.  They need to continue the Augmentin for seven days.

## 2015-08-22 ENCOUNTER — Ambulatory Visit: Payer: Medicare Other | Admitting: Urology

## 2015-08-27 ENCOUNTER — Ambulatory Visit (INDEPENDENT_AMBULATORY_CARE_PROVIDER_SITE_OTHER): Payer: Medicare Other | Admitting: Urology

## 2015-08-27 ENCOUNTER — Encounter: Payer: Self-pay | Admitting: Urology

## 2015-08-27 VITALS — BP 133/71 | HR 54 | Ht 65.0 in | Wt 173.1 lb

## 2015-08-27 DIAGNOSIS — N390 Urinary tract infection, site not specified: Secondary | ICD-10-CM | POA: Diagnosis not present

## 2015-08-27 DIAGNOSIS — N952 Postmenopausal atrophic vaginitis: Secondary | ICD-10-CM

## 2015-08-27 LAB — URINALYSIS, COMPLETE
Bilirubin, UA: NEGATIVE
GLUCOSE, UA: NEGATIVE
KETONES UA: NEGATIVE
NITRITE UA: POSITIVE — AB
Specific Gravity, UA: 1.015 (ref 1.005–1.030)
UUROB: 0.2 mg/dL (ref 0.2–1.0)
pH, UA: 5.5 (ref 5.0–7.5)

## 2015-08-27 LAB — MICROSCOPIC EXAMINATION: RBC MICROSCOPIC, UA: NONE SEEN /HPF (ref 0–?)

## 2015-08-27 NOTE — Progress Notes (Signed)
In and Out Catheterization  Patient is present today for a I & O catheterization due to recurrent uti. Patient was cleaned and prepped in a sterile fashion with betadine and Lidocaine 2% jelly was instilled into the urethra.  A 14FR cath was inserted no complications were noted , of urine return was noted, urine was clear and yellow in color. A clean urine sample was collected for clean catch UA. Bladder was drained  and catheter was removed with out difficulty.    Preformed by: Dallas Schimke CMA

## 2015-08-27 NOTE — Progress Notes (Signed)
08/27/2015 12:58 PM   Pamela [name] 07/25/1934 [id number]  Referring provider: [name], MD [address] [place], [place] [address]  Chief Complaint  Patient presents with  . Recurrent UTI    2 week follow up  . Vaginitis    HPI: Patient is a 81 year old white female who presents today for 2 week follow-up after he placed on vaginal estrogen cream for her atrophic vaginitis.  Previous history: Patient is a 81 year old white female who presents today to establish care with a local urologist since her former urologist has relocated. She was being seen and treated for recurrent urinary tract infections. Her baseline symptomatology are nocturia, mild urinary leakage, intermittency, hesitancy, a weak urinary stream, frequent urination and urgency.  She underwent renal ultrasound and cystoscopic examination last year and no abnormalities were identified. She is currently on tamsulosin 0.4 mg 1 capsule daily.  She has not been using the vaginal estrogen cream as prescribed. She forgot about the sample.  She does not complain of dysuria, gross hematuria or suprapubic pain at this visit. She also denies any fevers, chills, nausea or vomiting.  At her visit with [place] on September 8, she did have a positive urine culture with Escherichia coli and Proteus. She is prescribed the appropriate antibiotic.  Her UA today is suspicious for infection, but she is not having urinary symptoms at this time.  I will culture the urine in case she develops symptoms in the future.    PMH: Past Medical History  Diagnosis Date  . Diabetes mellitus   . Hypertension   . Dysrhythmia   . High cholesterol   . A-fib 08/28/2014    Overview:  Overview:  Diastolic dysfunction on ECHO 04/2012-done at [facility]   . Abnormal gait 02/08/2012  . Abscess or cellulitis of leg 04/18/2012  . Absence of bladder continence 07/29/2012  . Acid reflux 02/08/2012  . Acute kidney failure 05/15/2012  . Afib, paroxysmal,  with RVR at times with BBB 05/13/2012  . Allergic drug rash 05/15/2012  . Anemia 05/13/2012  . 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 Benign essential HTN 02/08/2012  . Bladder infection, chronic 06/29/2013  . BP (high blood pressure) 05/12/2012  . Bursitis of knee 08/07/2014  . Cellulitis in diabetic foot 05/12/2012  . Chronic kidney disease 06/01/2012    Overview:  Overview:   12/2012- Renal US, simple L cyst. UIEP normal   . Clinical depression 08/28/2014  . CN (constipation) 03/17/2012  . Dermatitis due to drug reaction 05/15/2012  . Diastolic dysfunction, left ventricle 05/14/2012  . DM (diabetes mellitus) 05/12/2012  . Edema leg 07/02/2014  . Enthesopathy of knee 08/07/2014  . Excess fluid volume 05/20/2012  . FOM (frequency of micturition) 07/29/2012  . Frank hematuria 09/27/2014  . HTN (hypertension) 05/12/2012  . Hypercholesterolemia 05/12/2012  . Incomplete bladder emptying 07/29/2012  . Open wound of knee, leg (except thigh), and ankle 06/01/2012  . Stevens-Johnson syndrome 05/15/2012    Overview:  Overview:  Several years ago, allergic reaction to sulfa antibiotic Several years ago, allergic reaction to sulfa antibiotic   . Gangrene     Surgical History: Past Surgical History  Procedure Laterality Date  . Toe amputation    . Tonsillectomy    . Abdominal hysterectomy    . Cataract surgery    . Endovenous ablation saphenous vein w/ laser      Home Medications:    Medication List       This list is accurate as of: 08/27/15  11:59 PM.  Always use your most recent med list.               ALIGN 4 MG Caps  Take by mouth.     amiodarone 200 MG tablet  Commonly known as:  PACERONE  Take 2 tablets twice daily for 2 days, then take 2 tablets once daily for 7 days, then take 1 tablet once daily till seen by heart doctor     amitriptyline 75 MG tablet  Commonly known as:  ELAVIL  Take 1 tablet (75 mg total) by mouth at bedtime. MAY RESUME IN 3 DAYS, NO EARLIER     amoxicillin-clavulanate  500-125 MG tablet  Commonly known as:  AUGMENTIN  Take 1 tablet (500 mg total) by mouth 3 (three) times daily.     apixaban 5 MG Tabs tablet  Commonly known as:  ELIQUIS  Take 1 tablet (5 mg total) by mouth 2 (two) times daily.     atorvastatin 40 MG tablet  Commonly known as:  LIPITOR  Take 40 mg by mouth daily.     calcium carbonate 1250 (500 CA) MG tablet  Commonly known as:  OS-CAL - dosed in mg of elemental calcium  Take 1 tablet (500 mg of elemental calcium total) by mouth 3 (three) times daily.     esomeprazole 40 MG capsule  Commonly known as:  NEXIUM  Take 40 mg by mouth daily before breakfast.     furosemide 40 MG tablet  Commonly known as:  LASIX  TAKE 1 TABLET DAILY FOR 5 DAYS AND THEN AS NEEDED FOR LEG SWELLING     glucose blood test strip     HUMALOG MIX 75/25 (75-25) 100 UNIT/ML Susp injection  Generic drug:  insulin lispro protamine-lispro     LEADER INSULIN SYRINGE 30G X 5/16" 0.5 ML Misc  Generic drug:  Insulin Syringe-Needle U-100  Use as directed.     lisinopril 20 MG tablet  Commonly known as:  PRINIVIL,ZESTRIL  Take 20 mg by mouth daily.     metFORMIN 1000 MG tablet  Commonly known as:  GLUCOPHAGE  Take 1,000 mg by mouth 2 (two) times daily.     metoCLOPramide 5 MG tablet  Commonly known as:  REGLAN  Take 1 tablet (5 mg total) by mouth 4 (four) times daily -  before meals and at bedtime.     metoprolol 50 MG tablet  Commonly known as:  LOPRESSOR  Take 1 tablet (50 mg total) by mouth 2 (two) times daily.     nabumetone 500 MG tablet  Commonly known as:  RELAFEN  Take 500 mg by mouth 2 (two) times daily as needed. Pain     NOVOLOG MIX 70/30 FLEXPEN (70-30) 100 UNIT/ML FlexPen  Generic drug:  insulin aspart protamine - aspart  INJECT 20 UNITS SUBQ EVERY MORNING AND 18 UNITS DAILY AT 5PM     pantoprazole 40 MG tablet  Commonly known as:  PROTONIX  Take 40 mg by mouth daily.     PHARMACIST CHOICE LANCETS Misc  USE 2 TIMES DAILY FOR  DIABETIC TESTING     polyethylene glycol packet  Commonly known as:  MIRALAX / GLYCOLAX  Take by mouth.     potassium chloride SA 20 MEQ tablet  Commonly known as:  K-DUR,KLOR-CON  1 TABLET DAILY FOR 5 DAYS AND THEN TAKE ONLY WHEN YOU TAKE LASIX     tamsulosin 0.4 MG Caps capsule  Commonly known as:  FLOMAX  Take 0.4  mg by mouth daily.        Allergies:  Allergies  Allergen Reactions  . Ciprofloxacin Anaphylaxis  . Sulfamethoxazole-Trimethoprim Other (See Comments) and Rash    Went into [employer].  . Levofloxacin Other (See Comments)  . Losartan Potassium-Hctz Other (See Comments)    Other reaction(s): Unknown  . Nitrofurantoin Other (See Comments)  . Sulfa Antibiotics Other (See Comments)    Levonne Spiller Steven's Johnsons  . Sulfasalazine     unknown    Family History: Family History  Problem Relation Age of Onset  . Kidney disease Neg Hx   . Prostate cancer Neg Hx   . Bladder Cancer Neg Hx   . Diabetes Mellitus II Mother   . Diabetes Paternal Grandmother   . Diabetes Maternal Grandmother   . Lung cancer Father   . Heart disease Mother     Social History:  reports that she has never smoked. She does not have any smokeless tobacco history on file. She reports that she does not drink alcohol or use illicit drugs.  ROS: UROLOGY Frequent Urination?: Yes Hard to postpone urination?: Yes Burning/pain with urination?: No Get up at night to urinate?: Yes Leakage of urine?: Yes Urine stream starts and stops?: No Trouble starting stream?: No Do you have to strain to urinate?: No Blood in urine?: No Urinary tract infection?: No Sexually transmitted disease?: No Injury to kidneys or bladder?: No Painful intercourse?: No Weak stream?: No Currently pregnant?: No Vaginal bleeding?: No Last menstrual period?: n  Gastrointestinal Nausea?: No Vomiting?: No Indigestion/heartburn?: No Diarrhea?: No Constipation?: No  Constitutional Fever:  No Night sweats?: No Weight loss?: No Fatigue?: No  Skin Skin rash/lesions?: No Itching?: No  Eyes Blurred vision?: No Double vision?: No  Ears/Nose/Throat Sore throat?: No Sinus problems?: No  Hematologic/Lymphatic Swollen glands?: No Easy bruising?: No  Cardiovascular Leg swelling?: No Chest pain?: No  Respiratory Cough?: No Shortness of breath?: No  Endocrine Excessive thirst?: No  Musculoskeletal Back pain?: No Joint pain?: No  Neurological Headaches?: No Dizziness?: No  Psychologic Depression?: No Anxiety?: No  Physical Exam: BP 133/71 mmHg  Pulse 54  Ht 5\' 5"  (1.651 m)  Wt 173 lb 1.6 oz (78.518 kg)  BMI 28.81 kg/m2   Laboratory Data: Lab Results  Component Value Date   WBC 7.8 01/07/2015   HGB 8.4* 01/07/2015   HCT 26.7* 01/07/2015   MCV 98 01/07/2015   PLT 166 01/07/2015   Lab Results  Component Value Date   CREATININE 2.28* 01/07/2015   Lab Results  Component Value Date   HGBA1C 7.6* 05/13/2012   Urinalysis Results for orders placed or performed in visit on 08/27/15  CULTURE, URINE COMPREHENSIVE  Result Value Ref Range   Urine Culture, Comprehensive Final report (A)    Result 1 Escherichia coli (A)    ANTIMICROBIAL SUSCEPTIBILITY Comment   Microscopic Examination  Result Value Ref Range   WBC, UA >30 (H) 0 -  5 /hpf   RBC, UA None seen 0 -  2 /hpf   Epithelial Cells (non renal) 0-10 0 - 10 /hpf   Crystals Present (A) N/A   Crystal Type Amorphous Sediment N/A   Bacteria, UA Many (A) None seen/Few  Urinalysis, Complete  Result Value Ref Range   Specific Gravity, UA 1.015 1.005 - 1.030   pH, UA 5.5 5.0 - 7.5   Color, UA Yellow Yellow   Appearance Ur Cloudy (A) Clear   Leukocytes, UA 1+ (A) Negative  Protein, UA 1+ (A) Negative/Trace   Glucose, UA Negative Negative   Ketones, UA Negative Negative   RBC, UA Trace (A) Negative   Bilirubin, UA Negative Negative   Urobilinogen, Ur 0.2 0.2 - 1.0 mg/dL   Nitrite, UA  Positive (A) Negative   Microscopic Examination See below:    Assessment & Plan:    1. Recurrent UTI:   Patient's urine is suspicious for infection today, but she is not having any urinary tract symptoms at this time. I will send the urine for culture, but I will not prescribe antibiotics until she becomes symptomatic.  - Urinalysis, Complete - CULTURE, URINE COMPREHENSIVE  2. Atrophic vaginitis: Patient did not start her vaginal estrogen cream. She will return in 2 weeks for vaginal exam and symptom recheck.   Return in about 2 weeks (around 09/10/2015) for exam.  Michiel Cowboy, [facility] [address] [place], [place] [address] [phone]

## 2015-08-29 LAB — CULTURE, URINE COMPREHENSIVE

## 2015-08-30 ENCOUNTER — Other Ambulatory Visit: Payer: Self-pay

## 2015-08-30 DIAGNOSIS — N952 Postmenopausal atrophic vaginitis: Secondary | ICD-10-CM

## 2015-08-30 MED ORDER — ESTROGENS, CONJUGATED 0.625 MG/GM VA CREA: 1.0000 | TOPICAL_CREAM | Freq: Every day | VAGINAL | Status: DC

## 2015-09-19 ENCOUNTER — Ambulatory Visit: Payer: Medicare Other | Admitting: Urology

## 2015-09-20 ENCOUNTER — Ambulatory Visit: Payer: Medicare Other | Admitting: Urology

## 2015-09-26 ENCOUNTER — Ambulatory Visit (INDEPENDENT_AMBULATORY_CARE_PROVIDER_SITE_OTHER): Payer: Medicare Other | Admitting: Urology

## 2015-09-26 VITALS — BP 151/70 | HR 63 | Ht 65.0 in | Wt 174.0 lb

## 2015-09-26 DIAGNOSIS — N390 Urinary tract infection, site not specified: Secondary | ICD-10-CM

## 2015-09-26 DIAGNOSIS — N952 Postmenopausal atrophic vaginitis: Secondary | ICD-10-CM

## 2015-09-26 LAB — MICROSCOPIC EXAMINATION: Renal Epithel, UA: NONE SEEN /hpf

## 2015-09-26 LAB — URINALYSIS, COMPLETE
BILIRUBIN UA: NEGATIVE
GLUCOSE, UA: NEGATIVE
NITRITE UA: NEGATIVE
Specific Gravity, UA: 1.02 (ref 1.005–1.030)
UUROB: 0.2 mg/dL (ref 0.2–1.0)
pH, UA: 5.5 (ref 5.0–7.5)

## 2015-09-26 MED ORDER — AMOXICILLIN-POT CLAVULANATE 875-125 MG PO TABS: 1.0000 | ORAL_TABLET | Freq: Two times a day (BID) | ORAL | Status: DC

## 2015-09-26 NOTE — Progress Notes (Signed)
10:03 AM   Pamela [name] 02/10/1934 [id number]  Referring provider: [name], MD [address] [facility] [address] [facility] - INFECTIOUS DISEASE [place], [place] [address]  Chief Complaint  Patient presents with  . Follow-up  . Urinary Tract Infection    HPI: Patient is a 81 year old white female who presents today for 2 week follow-up after being placed on vaginal estrogen cream for her atrophic vaginitis.  Previous history: Patient is a 81 year old white female who presents today to establish care with a local urologist since her former urologist has relocated. She was being seen and treated for recurrent urinary tract infections. Her baseline symptomatology are nocturia, mild urinary leakage, intermittency, hesitancy, a weak urinary stream, frequent urination and urgency.  She underwent renal ultrasound and cystoscopic examination last year and no abnormalities were identified. She is currently on tamsulosin 0.4 mg 1 capsule daily.  She has not been using the vaginal estrogen cream as prescribed. She stated that the cream was never prescribed.  Today, she is having dysuria.  No gross hematuria or suprapubic pain at this visit. She also denies any fevers, chills, nausea or vomiting.  At her visit with [place] on September 8, she did have a positive urine culture with Escherichia coli and Proteus. She is prescribed the appropriate antibiotic.    Her UA is suspicious for infection.  I will send it for culture.     PMH: Past Medical History  Diagnosis Date  . Diabetes mellitus   . Hypertension   . Dysrhythmia   . High cholesterol   . A-fib 08/28/2014    Overview:  Overview:  Diastolic dysfunction on ECHO 04/2012-done at [facility]   . Abnormal gait 02/08/2012  . Abscess or cellulitis of leg 04/18/2012  . Absence of bladder continence 07/29/2012  . Acid reflux 02/08/2012  . Acute kidney failure 05/15/2012  . Afib, paroxysmal, with RVR at times with BBB 05/13/2012  . Allergic drug  rash 05/15/2012  . Anemia 05/13/2012  . 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 Benign essential HTN 02/08/2012  . Bladder infection, chronic 06/29/2013  . BP (high blood pressure) 05/12/2012  . Bursitis of knee 08/07/2014  . Cellulitis in diabetic foot 05/12/2012  . Chronic kidney disease 06/01/2012    Overview:  Overview:   12/2012- Renal US, simple L cyst. UIEP normal   . Clinical depression 08/28/2014  . CN (constipation) 03/17/2012  . Dermatitis due to drug reaction 05/15/2012  . Diastolic dysfunction, left ventricle 05/14/2012  . DM (diabetes mellitus) 05/12/2012  . Edema leg 07/02/2014  . Enthesopathy of knee 08/07/2014  . Excess fluid volume 05/20/2012  . FOM (frequency of micturition) 07/29/2012  . Frank hematuria 09/27/2014  . HTN (hypertension) 05/12/2012  . Hypercholesterolemia 05/12/2012  . Incomplete bladder emptying 07/29/2012  . Open wound of knee, leg (except thigh), and ankle 06/01/2012  . Stevens-Johnson syndrome 05/15/2012    Overview:  Overview:  Several years ago, allergic reaction to sulfa antibiotic Several years ago, allergic reaction to sulfa antibiotic   . Gangrene     Surgical History: Past Surgical History  Procedure Laterality Date  . Toe amputation    . Tonsillectomy    . Abdominal hysterectomy    . Cataract surgery    . Endovenous ablation saphenous vein w/ laser      Home Medications:    Medication List       This list is accurate as of: 09/26/15 11:59 PM.  Always use your most recent med list.  ALIGN 4 MG Caps  Take by mouth.     amitriptyline 75 MG tablet  Commonly known as:  ELAVIL  Take 1 tablet (75 mg total) by mouth at bedtime. MAY RESUME IN 3 DAYS, NO EARLIER     amitriptyline 50 MG tablet  Commonly known as:  ELAVIL     amoxicillin-clavulanate 875-125 MG tablet  Commonly known as:  AUGMENTIN  Take 1 tablet by mouth every 12 (twelve) hours.     atorvastatin 40 MG tablet  Commonly known as:  LIPITOR  Take 40 mg by mouth daily.      calcium carbonate 1250 (500 CA) MG tablet  Commonly known as:  OS-CAL - dosed in mg of elemental calcium  Take 1 tablet (500 mg of elemental calcium total) by mouth 3 (three) times daily.     conjugated estrogens vaginal cream  Commonly known as:  PREMARIN  Place 1 Applicatorful vaginally daily.     glucose blood test strip     HUMALOG MIX 75/25 (75-25) 100 UNIT/ML Susp injection  Generic drug:  insulin lispro protamine-lispro     LEADER INSULIN SYRINGE 30G X 5/16" 0.5 ML Misc  Generic drug:  Insulin Syringe-Needle U-100  Use as directed.     lisinopril 20 MG tablet  Commonly known as:  PRINIVIL,ZESTRIL  Take 20 mg by mouth daily.     magnesium 30 MG tablet  Take 30 mg by mouth 2 (two) times daily.     metFORMIN 1000 MG tablet  Commonly known as:  GLUCOPHAGE  Take 1,000 mg by mouth 2 (two) times daily.     metoCLOPramide 5 MG tablet  Commonly known as:  REGLAN  Take 1 tablet (5 mg total) by mouth 4 (four) times daily -  before meals and at bedtime.     metoprolol 50 MG tablet  Commonly known as:  LOPRESSOR  Take 1 tablet (50 mg total) by mouth 2 (two) times daily.     nabumetone 500 MG tablet  Commonly known as:  RELAFEN  Take 500 mg by mouth 2 (two) times daily as needed. Pain     NOVOLOG MIX 70/30 FLEXPEN (70-30) 100 UNIT/ML FlexPen  Generic drug:  insulin aspart protamine - aspart  INJECT 20 UNITS SUBQ EVERY MORNING AND 18 UNITS DAILY AT 5PM     pantoprazole 40 MG tablet  Commonly known as:  PROTONIX  Take 40 mg by mouth daily.     PHARMACIST CHOICE LANCETS Misc  USE 2 TIMES DAILY FOR DIABETIC TESTING     polyethylene glycol packet  Commonly known as:  MIRALAX / GLYCOLAX  Take by mouth.     tamsulosin 0.4 MG Caps capsule  Commonly known as:  FLOMAX  Take 0.4 mg by mouth daily.        Allergies:  Allergies  Allergen Reactions  . Ciprofloxacin Anaphylaxis  . Sulfamethoxazole-Trimethoprim Other (See Comments) and Rash    Went into [employer].  . Levofloxacin Other (See Comments)  . Losartan Potassium-Hctz Other (See Comments)    Other reaction(s): Unknown  . Nitrofurantoin Other (See Comments)  . Sulfa Antibiotics Other (See Comments)    Levonne Spiller Steven's Johnsons  . Sulfasalazine     unknown    Family History: Family History  Problem Relation Age of Onset  . Kidney disease Neg Hx   . Prostate cancer Neg Hx   . Bladder Cancer Neg Hx   . Diabetes Mellitus II Mother   . Diabetes Paternal  Grandmother   . Diabetes Maternal Grandmother   . Lung cancer Father   . Heart disease Mother     Social History:  reports that she has never smoked. She does not have any smokeless tobacco history on file. She reports that she does not drink alcohol or use illicit drugs.  ROS: UROLOGY Frequent Urination?: Yes Hard to postpone urination?: Yes Burning/pain with urination?: Yes Get up at night to urinate?: Yes Leakage of urine?: Yes Urine stream starts and stops?: Yes Trouble starting stream?: Yes Do you have to strain to urinate?: Yes Blood in urine?: No Urinary tract infection?: Yes Sexually transmitted disease?: No Injury to kidneys or bladder?: No Painful intercourse?: No Weak stream?: No Currently pregnant?: No Vaginal bleeding?: No Last menstrual period?: n  Gastrointestinal Nausea?: No Vomiting?: No Indigestion/heartburn?: No Diarrhea?: No Constipation?: Yes  Constitutional Fever: No Night sweats?: No Weight loss?: No Fatigue?: Yes  Skin Skin rash/lesions?: No Itching?: No  Eyes Blurred vision?: No Double vision?: No  Ears/Nose/Throat Sore throat?: No Sinus problems?: No  Hematologic/Lymphatic Swollen glands?: No Easy bruising?: Yes  Cardiovascular Leg swelling?: Yes Chest pain?: No  Respiratory Cough?: No Shortness of breath?: No  Endocrine Excessive thirst?: No  Musculoskeletal Back pain?: No Joint pain?: No  Neurological Headaches?: No Dizziness?:  No  Psychologic Depression?: No Anxiety?: No  Physical Exam: BP 151/70 mmHg  Pulse 63  Ht 5\' 5"  (1.651 m)  Wt 174 lb (78.926 kg)  BMI 28.96 kg/m2   Laboratory Data: Lab Results  Component Value Date   WBC 7.8 01/07/2015   HGB 8.4* 01/07/2015   HCT 26.7* 01/07/2015   MCV 98 01/07/2015   PLT 166 01/07/2015   Lab Results  Component Value Date   CREATININE 2.28* 01/07/2015   Lab Results  Component Value Date   HGBA1C 7.6* 05/13/2012   Urinalysis: Results for orders placed or performed in visit on 09/26/15  CULTURE, URINE COMPREHENSIVE  Result Value Ref Range   Urine Culture, Comprehensive Final report (A)    Result 1 Escherichia coli (A)    Result 2 Lactobacillus species    ANTIMICROBIAL SUSCEPTIBILITY Comment   Microscopic Examination  Result Value Ref Range   WBC, UA >30 (A) 0 -  5 /hpf   RBC, UA 3-10 (A) 0 -  2 /hpf   Epithelial Cells (non renal) 0-10 0 - 10 /hpf   Renal Epithel, UA None seen None seen /hpf   Bacteria, UA Many (A) None seen/Few  Urinalysis, Complete  Result Value Ref Range   Specific Gravity, UA 1.020 1.005 - 1.030   pH, UA 5.5 5.0 - 7.5   Color, UA Yellow Yellow   Appearance Ur Cloudy (A) Clear   Leukocytes, UA 3+ (A) Negative   Protein, UA 2+ (A) Negative/Trace   Glucose, UA Negative Negative   Ketones, UA Trace (A) Negative   RBC, UA 1+ (A) Negative   Bilirubin, UA Negative Negative   Urobilinogen, Ur 0.2 0.2 - 1.0 mg/dL   Nitrite, UA Negative Negative   Microscopic Examination See below:    Assessment & Plan:    1. Recurrent UTI:   Patient's urine is suspicious for infection today and  she is not having  urinary tract symptoms at this time. I will send the urine for culture and have prescribed Augmentin empirically.  Adjust antibiotic as necessary when sensitives are available.    - Urinalysis, Complete - CULTURE, URINE COMPREHENSIVE  2. Atrophic vaginitis: Patient did not start her vaginal  estrogen cream. She stated it was  never called into her pharmacy.   We have given her samples of estrogen cream today.  She will return in 3 months for vaginal exam and symptom recheck.   Return in about 3 months (around 12/27/2015) for exam and UA.  Michiel Cowboy, PA-C  [facility] [address], [address] [place], [place] [address] [phone]

## 2015-09-26 NOTE — Progress Notes (Signed)
In and Out Catheterization  Patient is present today for a I & O catheterization due to possible UTI. Patient was cleaned and prepped in a sterile fashion with betadine and Lidocaine 2% jelly was instilled into the urethra.  A 14FR cath was inserted no complications were noted , 100ml of urine return was noted, urine was cloudy and yellow in color. A clean urine sample was collected for u/a and cx. Bladder was drained  And catheter was removed with out difficulty.    Preformed by: Rupert Stackshelsea Watkins, LPN

## 2015-09-29 LAB — CULTURE, URINE COMPREHENSIVE

## 2015-09-30 ENCOUNTER — Encounter: Payer: Self-pay | Admitting: Urology

## 2015-09-30 ENCOUNTER — Telehealth: Payer: Self-pay

## 2015-09-30 DIAGNOSIS — N390 Urinary tract infection, site not specified: Secondary | ICD-10-CM

## 2015-09-30 NOTE — Telephone Encounter (Signed)
Spoke with pt in reference to +ucx. Pt stated she has not picked up abx yet. Reinforced the need for the abx. Pt voiced understanding. Pt stated she would start today and RTC 10/11/15 for cath specimen.

## 2015-09-30 NOTE — Telephone Encounter (Signed)
-----   Message from Harle BattiestShannon A McGowan, PA-C sent at 09/29/2015 10:29 PM EDT ----- Patient has a +UCx.  They need to continue Augmentin  one  twice daily for seven days and then we need to check a CATH specimen in 3 to 5 days after they complete their antibiotics.

## 2015-10-11 ENCOUNTER — Ambulatory Visit (INDEPENDENT_AMBULATORY_CARE_PROVIDER_SITE_OTHER): Payer: Medicare Other | Admitting: *Deleted

## 2015-10-11 DIAGNOSIS — N390 Urinary tract infection, site not specified: Secondary | ICD-10-CM

## 2015-10-11 LAB — URINALYSIS, COMPLETE
BILIRUBIN UA: NEGATIVE
Glucose, UA: NEGATIVE
Ketones, UA: NEGATIVE
Nitrite, UA: NEGATIVE
PH UA: 7.5 (ref 5.0–7.5)
Specific Gravity, UA: 1.02 (ref 1.005–1.030)
Urobilinogen, Ur: 0.2 mg/dL (ref 0.2–1.0)

## 2015-10-11 LAB — MICROSCOPIC EXAMINATION
EPITHELIAL CELLS (NON RENAL): NONE SEEN /HPF (ref 0–10)
RBC, UA: NONE SEEN /hpf (ref 0–?)

## 2015-10-11 NOTE — Progress Notes (Signed)
In and Out Catheterization  Patient is present today for a I & O catheterization due to recurrent uti. Patient was cleaned and prepped in a sterile fashion with betadine and Lidocaine 2% jelly was instilled into the urethra.  A 14FR cath was inserted no complications were noted , 253ml of urine return was noted, urine was dark yellow in color. A clean urine sample was collected for UA and culture. Bladder was drained and catheter was removed with out difficulty.    Preformed by: Dallas Schimkeamona Cary Lothrop CMA

## 2015-10-14 LAB — CULTURE, URINE COMPREHENSIVE

## 2015-10-15 ENCOUNTER — Telehealth: Payer: Self-pay

## 2015-10-15 NOTE — Telephone Encounter (Signed)
-----   Message from Harle BattiestShannon A McGowan, PA-C sent at 10/14/2015  4:38 PM EST ----- I would like to request further identification on this patient's yeast.

## 2015-10-15 NOTE — Telephone Encounter (Signed)
Spoke with Morrie [facility]ldonAshley at [facility] and added on [employer] identification.

## 2015-10-17 LAB — ORGANISM IDENTIFICATION, YEAST

## 2015-10-18 ENCOUNTER — Telehealth: Payer: Self-pay

## 2015-10-18 DIAGNOSIS — B379 Candidiasis, unspecified: Secondary | ICD-10-CM

## 2015-10-18 MED ORDER — FLUCONAZOLE 150 MG PO TABS: ORAL_TABLET | ORAL | Status: DC

## 2015-10-18 NOTE — Telephone Encounter (Signed)
Spoke with pt in reference to infection. Pt voiced understanding. Pt will RTC 10/28/15 for cath specimen.

## 2015-10-18 NOTE — Telephone Encounter (Signed)
-----   Message from Harle BattiestShannon A McGowan, PA-C sent at 10/18/2015  8:50 AM EST ----- Please call in Diflucan 150 mg po for three days.  Then we need to check a CATH specimen in 3 to 5 days after finishing the antifungal.

## 2015-10-28 ENCOUNTER — Encounter: Payer: Self-pay | Admitting: Obstetrics and Gynecology

## 2015-10-28 ENCOUNTER — Ambulatory Visit: Payer: Medicare Other | Admitting: Obstetrics and Gynecology

## 2015-10-30 ENCOUNTER — Encounter: Payer: Self-pay | Admitting: Obstetrics and Gynecology

## 2015-10-30 ENCOUNTER — Ambulatory Visit (INDEPENDENT_AMBULATORY_CARE_PROVIDER_SITE_OTHER): Payer: Medicare Other | Admitting: Obstetrics and Gynecology

## 2015-10-30 VITALS — BP 185/92 | HR 59 | Resp 16 | Ht 65.0 in | Wt 171.0 lb

## 2015-10-30 DIAGNOSIS — R350 Frequency of micturition: Secondary | ICD-10-CM | POA: Diagnosis not present

## 2015-10-30 DIAGNOSIS — N390 Urinary tract infection, site not specified: Secondary | ICD-10-CM | POA: Diagnosis not present

## 2015-10-30 LAB — URINALYSIS, COMPLETE
BILIRUBIN UA: NEGATIVE
Glucose, UA: NEGATIVE
KETONES UA: NEGATIVE
Nitrite, UA: POSITIVE — AB
PH UA: 7.5 (ref 5.0–7.5)
Specific Gravity, UA: 1.02 (ref 1.005–1.030)
UUROB: 0.2 mg/dL (ref 0.2–1.0)

## 2015-10-30 LAB — MICROSCOPIC EXAMINATION: RBC, UA: NONE SEEN /hpf (ref 0–?)

## 2015-10-30 NOTE — Patient Instructions (Signed)

## 2015-10-30 NOTE — Progress Notes (Signed)
10/30/2015 3:42 PM   Pamela [name] 05-Nov-1934 [id number]  Referring provider: [name], MD [address] [facility] [address] [name] WEST - INFECTIOUS DISEASE [place], [place] [address]  Chief Complaint  Patient presents with  . Recurrent UTI    HPI: Patient is a 81 year old female with a history of recurrent urinary tract infections. Most recently her urine culture was positive for yeast. She was appropriately treated and she is presenting today to provide a catheterized urine specimen for culture and symptoms recheck.  Continued urinary frequency. Dysuria has resolved.  She states that she was taking Doxycycline cellulitis on her left leg and finished her antifungal.  No flank pain or fevers.    She has been using the vaginal estrogen cream as directed x 2 weeks.    PMH: Past Medical History  Diagnosis Date  . Diabetes mellitus   . Hypertension   . Dysrhythmia   . High cholesterol   . A-fib (HCC) 08/28/2014    Overview:  Overview:  Diastolic dysfunction on ECHO 04/2012-done at [facility]   . Abnormal gait 02/08/2012  . Abscess or cellulitis of leg 04/18/2012  . Absence of bladder continence 07/29/2012  . Acid reflux 02/08/2012  . Acute kidney failure (HCC) 05/15/2012  . Afib, paroxysmal, with RVR at times with BBB 05/13/2012  . Allergic drug rash 05/15/2012  . Anemia 05/13/2012  . 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 Benign essential HTN 02/08/2012  . Bladder infection, chronic 06/29/2013  . BP (high blood pressure) 05/12/2012  . Bursitis of knee 08/07/2014  . Cellulitis in diabetic foot (HCC) 05/12/2012  . Chronic kidney disease 06/01/2012    Overview:  Overview:   12/2012- Renal US, simple L cyst. UIEP normal   . Clinical depression 08/28/2014  . CN (constipation) 03/17/2012  . Dermatitis due to drug reaction 05/15/2012  . Diastolic dysfunction, left ventricle 05/14/2012  . DM (diabetes mellitus) (HCC) 05/12/2012  . Edema leg 07/02/2014  . Enthesopathy of knee 08/07/2014  . Excess fluid volume  05/20/2012  . FOM (frequency of micturition) 07/29/2012  . Frank hematuria 09/27/2014  . HTN (hypertension) 05/12/2012  . Hypercholesterolemia 05/12/2012  . Incomplete bladder emptying 07/29/2012  . Open wound of knee, leg (except thigh), and ankle 06/01/2012  . 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 Gangrene [facility])     Surgical History: Past Surgical History  Procedure Laterality Date  . Toe amputation    . Tonsillectomy    . Abdominal hysterectomy    . Cataract surgery    . Endovenous ablation saphenous vein w/ laser      Home Medications:    Medication List       This list is accurate as of: 10/30/15  3:42 PM.  Always use your most recent med list.               ALIGN 4 MG Caps  Take by mouth.     amitriptyline 75 MG tablet  Commonly known as:  ELAVIL  Take 1 tablet (75 mg total) by mouth at bedtime. MAY RESUME IN 3 DAYS, NO EARLIER     amitriptyline 50 MG tablet  Commonly known as:  ELAVIL     atorvastatin 40 MG tablet  Commonly known as:  LIPITOR  Take 40 mg by mouth daily.     calcium carbonate 1250 (500 CA) MG tablet  Commonly known  as:  OS-CAL - dosed in mg of elemental calcium  Take 1 tablet (500 mg of elemental calcium total) by mouth 3 (three) times daily.     conjugated estrogens vaginal cream  Commonly known as:  PREMARIN  Place 1 Applicatorful vaginally daily.     fluconazole 150 MG tablet  Commonly known as:  DIFLUCAN  Once for 3 days     glucose blood test strip     HUMALOG MIX 75/25 (75-25) 100 UNIT/ML Susp injection  Generic drug:  insulin lispro protamine-lispro     LEADER INSULIN SYRINGE 30G X 5/16" 0.5 ML Misc  Generic drug:  Insulin Syringe-Needle U-100  Use as directed.     lisinopril 20 MG tablet  Commonly known as:  PRINIVIL,ZESTRIL  Take 20 mg by mouth daily.     magnesium 30 MG tablet   Take 30 mg by mouth 2 (two) times daily.     metFORMIN 1000 MG tablet  Commonly known as:  GLUCOPHAGE  Take 1,000 mg by mouth 2 (two) times daily.     metoCLOPramide 5 MG tablet  Commonly known as:  REGLAN  Take 1 tablet (5 mg total) by mouth 4 (four) times daily -  before meals and at bedtime.     metoprolol 50 MG tablet  Commonly known as:  LOPRESSOR  Take 1 tablet (50 mg total) by mouth 2 (two) times daily.     nabumetone 500 MG tablet  Commonly known as:  RELAFEN  Take 500 mg by mouth 2 (two) times daily as needed. Pain     NOVOLOG MIX 70/30 FLEXPEN (70-30) 100 UNIT/ML FlexPen  Generic drug:  insulin aspart protamine - aspart  INJECT 20 UNITS SUBQ EVERY MORNING AND 18 UNITS DAILY AT 5PM     pantoprazole 40 MG tablet  Commonly known as:  PROTONIX  Take 40 mg by mouth daily.     PHARMACIST CHOICE LANCETS Misc  USE 2 TIMES DAILY FOR DIABETIC TESTING     polyethylene glycol packet  Commonly known as:  MIRALAX / GLYCOLAX  Take by mouth.     tamsulosin 0.4 MG Caps capsule  Commonly known as:  FLOMAX  Take 0.4 mg by mouth daily.        Allergies:  Allergies  Allergen Reactions  . Ciprofloxacin Anaphylaxis  . Sulfamethoxazole-Trimethoprim Other (See Comments) and Rash    Went into [employer].  . Levofloxacin Other (See Comments)  . Losartan Potassium-Hctz Other (See Comments)    Other reaction(s): Unknown  . Nitrofurantoin Other (See Comments)  . Sulfa Antibiotics Other (See Comments)    Levonne Spiller Steven's Johnsons  . Sulfasalazine     unknown    Family History: Family History  Problem Relation Age of Onset  . Kidney disease Neg Hx   . Prostate cancer Neg Hx   . Bladder Cancer Neg Hx   . Diabetes Mellitus II Mother   . Diabetes Paternal Grandmother   . Diabetes Maternal Grandmother   . Lung cancer Father   . Heart disease Mother     Social History:  reports that she has never smoked. She does not have any smokeless tobacco history  on file. She reports that she does not drink alcohol or use illicit drugs.  ROS: UROLOGY Frequent Urination?: Yes Hard to postpone urination?: Yes Burning/pain with urination?: No Get up at night to urinate?: Yes Leakage of urine?: Yes Urine stream starts and stops?: No Trouble starting stream?: No Do you have to  strain to urinate?: No Blood in urine?: No Urinary tract infection?: Yes Sexually transmitted disease?: No Injury to kidneys or bladder?: No Painful intercourse?: No Weak stream?: No Currently pregnant?: No Vaginal bleeding?: No Last menstrual period?: n  Gastrointestinal Nausea?: No Vomiting?: No Indigestion/heartburn?: No Diarrhea?: No Constipation?: No  Constitutional Fever: No Night sweats?: No Weight loss?: No Fatigue?: No  Skin Skin rash/lesions?: No Itching?: No  Eyes Blurred vision?: No Double vision?: No  Ears/Nose/Throat Sore throat?: No Sinus problems?: No  Hematologic/Lymphatic Swollen glands?: No Easy bruising?: No  Cardiovascular Leg swelling?: No Chest pain?: No  Respiratory Cough?: No Shortness of breath?: No  Endocrine Excessive thirst?: No  Musculoskeletal Back pain?: No Joint pain?: No  Neurological Headaches?: No Dizziness?: No  Psychologic Depression?: No Anxiety?: No  Physical Exam: BP 185/92 mmHg  Pulse 59  Resp 16  Ht 5\' 5"  (1.651 m)  Wt 171 lb (77.565 kg)  BMI 28.46 kg/m2  Constitutional:  Alert and oriented, No acute distress. HEENT: [place] AT, moist mucus membranes.  Trachea midline, no masses. Cardiovascular: No clubbing, cyanosis, or edema. Respiratory: Normal respiratory effort, no increased work of breathing. Skin: No rashes, bruises or suspicious lesions. Neurologic: Grossly intact, no focal deficits, moving all 4 extremities. Psychiatric: Normal mood and affect.  Laboratory Data:   Urinalysis Results for orders placed or performed in visit on 10/30/15  Microscopic Examination  Result  Value Ref Range   WBC, UA >30 (H) 0 -  5 /hpf   RBC, UA None seen 0 -  2 /hpf   Epithelial Cells (non renal) 0-10 0 - 10 /hpf   Renal Epithel, UA 0-10 (A) None seen /hpf   Bacteria, UA Many (A) None seen/Few  Urinalysis, Complete  Result Value Ref Range   Specific Gravity, UA 1.020 1.005 - 1.030   pH, UA 7.5 5.0 - 7.5   Color, UA Yellow Yellow   Appearance Ur Cloudy (A) Clear   Leukocytes, UA 2+ (A) Negative   Protein, UA 1+ (A) Negative/Trace   Glucose, UA Negative Negative   Ketones, UA Negative Negative   RBC, UA Trace (A) Negative   Bilirubin, UA Negative Negative   Urobilinogen, Ur 0.2 0.2 - 1.0 mg/dL   Nitrite, UA Positive (A) Negative   Microscopic Examination See below:     Pertinent Imaging:  Assessment & Plan:    1. Recurrent UTI-  In and out catheter specimen obtained today. UA this patient's for inspection. Specimen sent for culture. UTI prevention strategies discussed.  Good perineal hygiene reviewed. Patient is encouraged to increase daily water intake, start cranberry supplements to prevent invasive colonization along the urinary tract and probiotics, especially lactobacillus to restore normal vaginal flora. - Urinalysis, Complete -Urine Culture  2.  Urinary Frequency- Recheck in 4 weeks.  If UC negative may consider OAB medications.   Return in about 4 weeks (around 11/27/2015) for recheck recurrent UTI; pelvic exam.  These notes generated with voice recognition software. I apologize for typographical errors.  Earlie LouLindsay Lai Hendriks, FNP  [facility] [address], [address] [place], [place] [address] [phone]

## 2015-11-07 LAB — SPECIMEN STATUS REPORT

## 2015-11-28 ENCOUNTER — Ambulatory Visit: Payer: Medicare Other | Admitting: Obstetrics and Gynecology

## 2015-12-04 ENCOUNTER — Encounter: Payer: Self-pay | Admitting: Urology

## 2015-12-04 ENCOUNTER — Ambulatory Visit (INDEPENDENT_AMBULATORY_CARE_PROVIDER_SITE_OTHER): Payer: Medicare Other | Admitting: Urology

## 2015-12-04 VITALS — BP 136/87 | HR 53 | Temp 97.8°F | Resp 16 | Ht 65.0 in | Wt 167.0 lb

## 2015-12-04 DIAGNOSIS — R32 Unspecified urinary incontinence: Secondary | ICD-10-CM | POA: Diagnosis not present

## 2015-12-04 DIAGNOSIS — N952 Postmenopausal atrophic vaginitis: Secondary | ICD-10-CM

## 2015-12-04 DIAGNOSIS — N8110 Cystocele, unspecified: Secondary | ICD-10-CM | POA: Diagnosis not present

## 2015-12-04 DIAGNOSIS — B356 Tinea cruris: Secondary | ICD-10-CM

## 2015-12-04 DIAGNOSIS — IMO0002 Reserved for concepts with insufficient information to code with codable children: Secondary | ICD-10-CM

## 2015-12-04 DIAGNOSIS — N390 Urinary tract infection, site not specified: Secondary | ICD-10-CM

## 2015-12-04 LAB — URINALYSIS, COMPLETE
Bilirubin, UA: NEGATIVE
GLUCOSE, UA: NEGATIVE
KETONES UA: NEGATIVE
NITRITE UA: POSITIVE — AB
SPEC GRAV UA: 1.015 (ref 1.005–1.030)
UUROB: 1 mg/dL (ref 0.2–1.0)
pH, UA: 8.5 — ABNORMAL HIGH (ref 5.0–7.5)

## 2015-12-04 LAB — MICROSCOPIC EXAMINATION
Epithelial Cells (non renal): NONE SEEN /hpf (ref 0–10)
RBC MICROSCOPIC, UA: NONE SEEN /HPF (ref 0–?)

## 2015-12-04 MED ORDER — NYSTATIN 100000 UNIT/GM EX CREA: 1.0000 "application " | TOPICAL_CREAM | Freq: Two times a day (BID) | CUTANEOUS | Status: DC

## 2015-12-04 NOTE — Progress Notes (Signed)
12/04/2015 2:47 PM   Pamela [name] Nov 22, 1934 [id number]  Referring provider: [name], MD [address] [facility] [address] [facility] WEST - INFECTIOUS DISEASE [place], [place] [address]  Chief Complaint  Patient presents with  . Follow-up  . Recurrent UTI    HPI: Patient is an 82 year old Caucasian female with a history of recurrent urinary tract infection who presents today for a one month follow-up.  Previous history: Patient is a 82 year old white female who presents today to establish care with a local urologist since her former urologist has relocated. She was being seen and treated for recurrent urinary tract infections. Her baseline symptomatology are nocturia, mild urinary leakage, intermittency, hesitancy, a weak urinary stream, frequent urination and urgency.  She underwent renal ultrasound and cystoscopic examination last year and no abnormalities were identified. She is currently on tamsulosin 0.4 mg 1 capsule daily.  Today, she states that she is experiencing urinary frequency and dysuria. His has been occurring for the last week. She is not had gross hematuria or suprapubic pain. She also denies fevers, chills, nausea or vomiting.  She does suffer from constipation, but denies episodes of diarrhea.  She has had multiple urinary tract infections, with multiple organisms with variable resistance patterns.  It seems that she has a urinary tract infection about once a month.  She has not had any recent imaging studies.    She is using her vaginal estrogen cream as prescribed.  Her UA is suspicious for infection on today's exam.  She experiences continuous urinary leakage and wears depends daily.    She also complains of itching on her mons pubis and in between her legs.  PMH: Past Medical History  Diagnosis Date  . Diabetes mellitus   . Hypertension   . Dysrhythmia   . High cholesterol   . A-fib (HCC) 08/28/2014    Overview:  Overview:  Diastolic dysfunction on  ECHO 01/864-[facility] at [facility]   . Abnormal gait 02/08/2012  . Abscess or cellulitis of leg 04/18/2012  . Absence of bladder continence 07/29/2012  . Acid reflux 02/08/2012  . Acute kidney failure (HCC) 05/15/2012  . Afib, paroxysmal, with RVR at times with BBB 05/13/2012  . Allergic drug rash 05/15/2012  . Anemia 05/13/2012  . 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 Benign essential HTN 02/08/2012  . Bladder infection, chronic 06/29/2013  . BP (high blood pressure) 05/12/2012  . Bursitis of knee 08/07/2014  . Cellulitis in diabetic foot (HCC) 05/12/2012  . Chronic kidney disease 06/01/2012    Overview:  Overview:   12/2012- Renal US, simple L cyst. UIEP normal   . Clinical depression 08/28/2014  . CN (constipation) 03/17/2012  . Dermatitis due to drug reaction 05/15/2012  . Diastolic dysfunction, left ventricle 05/14/2012  . DM (diabetes mellitus) (HCC) 05/12/2012  . Edema leg 07/02/2014  . Enthesopathy of knee 08/07/2014  . Excess fluid volume 05/20/2012  . FOM (frequency of micturition) 07/29/2012  . Frank hematuria 09/27/2014  . HTN (hypertension) 05/12/2012  . Hypercholesterolemia 05/12/2012  . Incomplete bladder emptying 07/29/2012  . Open wound of knee, leg (except thigh), and ankle 06/01/2012  . 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 Gangrene [facility])     Surgical History: Past Surgical History  Procedure Laterality Date  . Toe amputation    . Tonsillectomy    . Abdominal hysterectomy    .  Cataract surgery    . Endovenous ablation saphenous vein w/ laser      Home Medications:    Medication List       This list is accurate as of: 12/04/15 11:59 PM.  Always use your most recent med list.               ALIGN 4 MG Caps  Take by mouth.     amitriptyline 75 MG tablet  Commonly known as:  ELAVIL  Take 1 tablet (75 mg total) by mouth at bedtime. MAY RESUME  IN 3 DAYS, NO EARLIER     amitriptyline 50 MG tablet  Commonly known as:  ELAVIL     atorvastatin 40 MG tablet  Commonly known as:  LIPITOR  Take 40 mg by mouth daily.     calcium carbonate 1250 (500 Ca) MG tablet  Commonly known as:  OS-CAL - dosed in mg of elemental calcium  Take 1 tablet (500 mg of elemental calcium total) by mouth 3 (three) times daily.     conjugated estrogens vaginal cream  Commonly known as:  PREMARIN  Place 1 Applicatorful vaginally daily.     fluconazole 150 MG tablet  Commonly known as:  DIFLUCAN  Once for 3 days     glucose blood test strip     HUMALOG MIX 75/25 (75-25) 100 UNIT/ML Susp injection  Generic drug:  insulin lispro protamine-lispro     LEADER INSULIN SYRINGE 30G X 5/16" 0.5 ML Misc  Generic drug:  Insulin Syringe-Needle U-100  Use as directed.     lisinopril 20 MG tablet  Commonly known as:  PRINIVIL,ZESTRIL  Take 20 mg by mouth daily.     magnesium 30 MG tablet  Take 30 mg by mouth 2 (two) times daily.     metFORMIN 1000 MG tablet  Commonly known as:  GLUCOPHAGE  Take 1,000 mg by mouth 2 (two) times daily.     metoCLOPramide 5 MG tablet  Commonly known as:  REGLAN  Take 1 tablet (5 mg total) by mouth 4 (four) times daily -  before meals and at bedtime.     metoprolol 50 MG tablet  Commonly known as:  LOPRESSOR  Take 1 tablet (50 mg total) by mouth 2 (two) times daily.     nabumetone 500 MG tablet  Commonly known as:  RELAFEN  Take 500 mg by mouth 2 (two) times daily as needed. Pain     NOVOLOG MIX 70/30 FLEXPEN (70-30) 100 UNIT/ML FlexPen  Generic drug:  insulin aspart protamine - aspart  INJECT 20 UNITS SUBQ EVERY MORNING AND 18 UNITS DAILY AT 5PM     nystatin cream  Commonly known as:  MYCOSTATIN  Apply 1 application topically 2 (two) times daily.     pantoprazole 40 MG tablet  Commonly known as:  PROTONIX  Take 40 mg by mouth daily.     PHARMACIST CHOICE LANCETS Misc  USE 2 TIMES DAILY FOR DIABETIC TESTING       polyethylene glycol packet  Commonly known as:  MIRALAX / GLYCOLAX  Take by mouth.     tamsulosin 0.4 MG Caps capsule  Commonly known as:  FLOMAX  Take 0.4 mg by mouth daily.        Allergies:  Allergies  Allergen Reactions  . Ciprofloxacin Anaphylaxis  . Sulfamethoxazole-Trimethoprim Other (See Comments) and Rash    Went into [employer].  . Levofloxacin Other (See Comments)  . Losartan Potassium-Hctz Other (See Comments)  Other reaction(s): Unknown  . Nitrofurantoin Other (See Comments)  . Sulfa Antibiotics Other (See Comments)    Levonne Spiller Steven's Johnsons  . Sulfasalazine     unknown    Family History: Family History  Problem Relation Age of Onset  . Kidney disease Neg Hx   . Prostate cancer Neg Hx   . Bladder Cancer Neg Hx   . Diabetes Mellitus II Mother   . Diabetes Paternal Grandmother   . Diabetes Maternal Grandmother   . Lung cancer Father   . Heart disease Mother     Social History:  reports that she has never smoked. She does not have any smokeless tobacco history on file. She reports that she does not drink alcohol or use illicit drugs.  ROS: UROLOGY Frequent Urination?: Yes Hard to postpone urination?: Yes Burning/pain with urination?: Yes Get up at night to urinate?: Yes Leakage of urine?: Yes Urine stream starts and stops?: Yes Trouble starting stream?: Yes Do you have to strain to urinate?: Yes Blood in urine?: No Urinary tract infection?: Yes Sexually transmitted disease?: No Injury to kidneys or bladder?: No Painful intercourse?: No Weak stream?: No Currently pregnant?: No Vaginal bleeding?: No Last menstrual period?: n  Gastrointestinal Nausea?: No Vomiting?: No Indigestion/heartburn?: No Diarrhea?: No Constipation?: Yes  Constitutional Fever: No Night sweats?: No Weight loss?: Yes Fatigue?: No  Skin Skin rash/lesions?: No Itching?: No  Eyes Blurred vision?: No Double vision?:  No  Ears/Nose/Throat Sore throat?: No Sinus problems?: No  Hematologic/Lymphatic Swollen glands?: No Easy bruising?: No  Cardiovascular Leg swelling?: No Chest pain?: No  Respiratory Cough?: No Shortness of breath?: Yes  Endocrine Excessive thirst?: Yes  Musculoskeletal Back pain?: No Joint pain?: No  Neurological Headaches?: No Dizziness?: No  Psychologic Depression?: No Anxiety?: No  Physical Exam: BP 136/87 mmHg  Pulse 53  Temp(Src) 97.8 F (36.6 C)  Resp 16  Ht 5\' 5"  (1.651 m)  Wt 167 lb (75.751 kg)  BMI 27.79 kg/m2  GU:  A trophic  external genitalia, normal pubic hair distribution.  Tinea cruris present.  Normal urethral meatus, no lesions, no prolapse, no discharge.   No urethral masses, tenderness and/or tenderness. No bladder fullness, tenderness or masses. Atrophic  vagina mucosa, poor estrogen effect, no discharge, no lesions, poor pelvic support, pessary in place.  Cervix and uterus are surgically absent.    No  pelvic masses are noted. Anus and perineum are without rashes or lesions.     Laboratory Data: Lab Results  Component Value Date   WBC 7.8 01/07/2015   HGB 8.4* 01/07/2015   HCT 26.7* 01/07/2015   MCV 98 01/07/2015   PLT 166 01/07/2015    Lab Results  Component Value Date   CREATININE 2.28* 01/07/2015   Lab Results  Component Value Date   HGBA1C 7.6* 05/13/2012    Lab Results  Component Value Date   TSH 2.546 05/19/2012   Urinalysis Results for orders placed or performed in visit on 12/04/15  CULTURE, URINE COMPREHENSIVE  Result Value Ref Range   Urine Culture, Comprehensive Final report (A)    Result 1 Proteus mirabilis (A)    ANTIMICROBIAL SUSCEPTIBILITY Comment   Microscopic Examination  Result Value Ref Range   WBC, UA >30 (H) 0 -  5 /hpf   RBC, UA None seen 0 -  2 /hpf   Epithelial Cells (non renal) None seen 0 - 10 /hpf   Bacteria, UA Many (A) None seen/Few  Urinalysis, Complete  Result Value Ref  Range    Specific Gravity, UA 1.015 1.005 - 1.030   pH, UA 8.5 (H) 5.0 - 7.5   Color, UA Yellow Yellow   Appearance Ur Turbid (A) Clear   Leukocytes, UA 2+ (A) Negative   Protein, UA 2+ (A) Negative/Trace   Glucose, UA Negative Negative   Ketones, UA Negative Negative   RBC, UA Trace (A) Negative   Bilirubin, UA Negative Negative   Urobilinogen, Ur 1.0 0.2 - 1.0 mg/dL   Nitrite, UA Positive (A) Negative   Microscopic Examination See below:     Pertinent Imaging: Results for DEVOTA, VIRUET (MRN [id number]) as of 12/07/2015 14:32  Ref. Range 08/08/2015 14:34  Scan Result Unknown 135    Assessment & Plan:    1. Recurrent urinary tract infections:    Patient is had several urinary tract infections over the last year. She has not had any recent imaging studies, so I obtain a renal ultrasound and KUB to look for urinary stones as a nidus for these infections.   She will follow-up for the renal ultrasound and KUB reports. Her urinalysis is suspicious for infections again and I have sent it for culture. She will be contacted with those results and placed on an antibiotic if appropriate.   2. Vaginal atrophy:   Patient is using her vaginal estrogen cream as prescribed.  She will continue applying the vaginal estrogen cream twice weekly.   3. Cystocele:   Patient is managing her cystocele with a pessary and is followed by gynecology.   4. Incontinence:  She is not a candidate for anti-cholinergics due to her moderate PVRs and mild dementia. She is also not a candidate for Myrbetriq due to her high blood pressure. She would like to undergo PTNS treatment to see if they can improve her incontinence.  5. Tinea cruris:  Patient will be prescribed nystatin cream to be applied twice daily. We will recheck the areas when she returns to the office for her first PTNS.  Return for PTNS, RUS and KUB reports.  Michiel Cowboy, PA-C  [facility] [address] [place], [place]  [address] [phone]

## 2015-12-06 ENCOUNTER — Telehealth: Payer: Self-pay | Admitting: Urology

## 2015-12-06 ENCOUNTER — Telehealth: Payer: Self-pay | Admitting: *Deleted

## 2015-12-06 DIAGNOSIS — N390 Urinary tract infection, site not specified: Secondary | ICD-10-CM

## 2015-12-06 LAB — CULTURE, URINE COMPREHENSIVE

## 2015-12-06 NOTE — Telephone Encounter (Signed)
Tried to contact patient about setting her PTNS treatments up. No answer no machine. Setting up treatments with Carollee HerterShannon on Wednesdays at 2:15.

## 2015-12-06 NOTE — Telephone Encounter (Signed)
Pamela HarmanDana spoke to patient and confirmed appointments and sent a schedule of appointments.Patient ok with plan.

## 2015-12-06 NOTE — Telephone Encounter (Signed)
I have contacted the patient about her antibiotic.

## 2015-12-07 DIAGNOSIS — IMO0002 Reserved for concepts with insufficient information to code with codable children: Secondary | ICD-10-CM | POA: Insufficient documentation

## 2015-12-07 DIAGNOSIS — R32 Unspecified urinary incontinence: Secondary | ICD-10-CM | POA: Insufficient documentation

## 2015-12-07 DIAGNOSIS — B356 Tinea cruris: Secondary | ICD-10-CM | POA: Insufficient documentation

## 2015-12-07 NOTE — Telephone Encounter (Signed)
I would also like the patient to undergo a renal ultrasound and a KUB for further evaluation of her recurrent urinary tract infections.  I placed orders in her chart.

## 2015-12-10 ENCOUNTER — Telehealth: Payer: Self-pay

## 2015-12-10 MED ORDER — AMOXICILLIN-POT CLAVULANATE 875-125 MG PO TABS: 1.0000 | ORAL_TABLET | Freq: Two times a day (BID) | ORAL | Status: AC

## 2015-12-10 NOTE — Telephone Encounter (Signed)
-----   Message from Harle BattiestShannon A McGowan, PA-C sent at 12/06/2015  5:02 PM EST ----- Please tell the patient she has a positive urine culture. I sent a prescription of Augmentin 875/125 one tablet twice daily for 7 days to [facility] pharmacy, [facility] [address].  We will need to recheck a CATH UA 3-5 days after she completes her antibiotics.

## 2015-12-10 NOTE — Telephone Encounter (Signed)
Spoke to pt in reference to KUB and renal u/s. Pt stated she was supposed to have an abx. Nurse resent rx. Made son aware.

## 2015-12-10 NOTE — Telephone Encounter (Signed)
LMOM

## 2015-12-18 ENCOUNTER — Ambulatory Visit (INDEPENDENT_AMBULATORY_CARE_PROVIDER_SITE_OTHER): Payer: Medicare Other | Admitting: Urology

## 2015-12-18 ENCOUNTER — Encounter: Payer: Self-pay | Admitting: Urology

## 2015-12-18 VITALS — BP 160/67 | HR 60 | Ht 63.5 in | Wt 168.2 lb

## 2015-12-18 DIAGNOSIS — R32 Unspecified urinary incontinence: Secondary | ICD-10-CM

## 2015-12-18 LAB — PTNS-PERCUTANEOUS TIBIAL NERVE STIMULATION: Scan Result: 19

## 2015-12-18 NOTE — Progress Notes (Signed)
Chief Complaint:  Chief Complaint  Patient presents with  . Urinary Incontinence    PTNS     HPI: Patient is an 82 year old Caucasian female with a history of recurrent UTI's and urinary incontinence who presents today for her first PTNS.  Her symptoms began several years ago.   The incontinence is not related to a specific event.  She experiences large leaks.  The leakage occurs day and night.  She experiences leakage with coughing, sneezing, or laughing.  She is experiencing leakage 5 times daily.  She is experiencing daytime frequency of 10 times daily.  She is experiencing nighttime frequency of 3 times nightly.  She is not having pain or burning with urination.   She is having a mild urgency.  She uses heavy pads for protection.  She is wearing two to three pads daily.  She is consuming one cup of coffee daily.        Previous Therapy: She has not tried any previous therapies.  She is not a candidate for anticholinergics due to her memory loss and age.  She is not a candidate for Myrbetriq due to her uncontrolled hypertension.  BP 160/67 mmHg  Pulse 60  Ht 5' 3.5" (1.613 m)  Wt 168 lb 3.2 oz (76.295 kg)  BMI 29.32 kg/m2  She does not have contraindications present for PTNS, such as:       Pacemaker      Implantable defibrillator      History of abnormal bleeding      History of neuropathies or nerve damage  Discussed with patient possible complications of procedure, such as discomfort, bleeding at insertion/stimulation site, procedure consent signed  Patient goals: Her goals for therapy are a reduction in incontinence.  PTNS treatment: The needle electrode was inserted into the lower inner aspect of the patient's left leg. The surface electrode was placed on the inside arch of the foot on the treatment leg. The lead set was connected to the stimulator and the needle electrode clip was connected to the needle electrode. The stimulator that produces an adjustable electrical pulse  that travels to the sacral nerve plexus via the tibial nerve was increased to 19 and until the patient noted a sensory response.  Treatment Plan:   Patient tolerated the PTNS therapy for 30 minutes. The electrode was removed without difficulty to the patient from the left leg. She will return in 1 week for her second PTNS

## 2015-12-25 ENCOUNTER — Ambulatory Visit (INDEPENDENT_AMBULATORY_CARE_PROVIDER_SITE_OTHER): Payer: Medicare Other | Admitting: Urology

## 2015-12-25 ENCOUNTER — Encounter: Payer: Self-pay | Admitting: Urology

## 2015-12-25 VITALS — BP 96/56 | HR 64 | Ht 63.5 in | Wt 167.4 lb

## 2015-12-25 DIAGNOSIS — R350 Frequency of micturition: Secondary | ICD-10-CM | POA: Diagnosis not present

## 2015-12-25 LAB — PTNS-PERCUTANEOUS TIBIAL NERVE STIMULATION: SCAN RESULT: 18

## 2015-12-26 DIAGNOSIS — R350 Frequency of micturition: Secondary | ICD-10-CM | POA: Insufficient documentation

## 2015-12-26 NOTE — Progress Notes (Signed)
Chief Complaint:  Chief Complaint  Patient presents with  . Urinary Incontinence    PTNS     HPI: Patient is an 82 year old Caucasian female with a history of recurrent UTI's and urinary incontinence who presents today for her 2nd PTNS.  Her symptoms began several years ago.   The incontinence is not related to a specific event.  She experiences large leaks.  The leakage occurs day and night.  She experiences leakage with coughing, sneezing, or laughing.  She is experiencing leakage 5 times daily.  She is experiencing daytime frequency of 10 times daily.  She is experiencing nighttime frequency of 3 times nightly.  She is not having pain or burning with urination.   She is having a mild urgency.  She uses heavy pads for protection.  She is wearing two to three pads daily.  She is consuming one cup of coffee daily.    After her last treatment, she is experiencing 10 daytime voids, 2 night time voids and 5 incontinence episodes daily.        Previous Therapy: She has not tried any previous therapies.  She is not a candidate for anticholinergics due to her memory loss and age.  She is not a candidate for Myrbetriq due to her uncontrolled hypertension.  BP 96/56 mmHg  Pulse 64  Ht 5' 3.5" (1.613 m)  Wt 167 lb 6.4 oz (75.932 kg)  BMI 29.18 kg/m2  She does not have contraindications present for PTNS, such as:       Pacemaker      Implantable defibrillator      History of abnormal bleeding      History of neuropathies or nerve damage  Discussed with patient possible complications of procedure, such as discomfort, bleeding at insertion/stimulation site, procedure consent signed  Patient goals: Her goals for therapy are a reduction in incontinence.  PTNS treatment: The needle electrode was inserted into the lower inner aspect of the patient's left leg. The surface electrode was placed on the inside arch of the foot on the treatment leg. The lead set was connected to the stimulator and the  needle electrode clip was connected to the needle electrode. The stimulator that produces an adjustable electrical pulse that travels to the sacral nerve plexus via the tibial nerve was increased to 18 and until the patient noted a toe flex.  Treatment Plan:   Patient tolerated the PTNS therapy for 30 minutes. The electrode was removed without difficulty to the patient from the left leg. She will return in 1 week for her 3rd  PTNS

## 2015-12-30 ENCOUNTER — Ambulatory Visit: Payer: Medicare Other | Admitting: Urology

## 2016-01-01 ENCOUNTER — Encounter: Payer: Self-pay | Admitting: Urology

## 2016-01-01 ENCOUNTER — Ambulatory Visit (INDEPENDENT_AMBULATORY_CARE_PROVIDER_SITE_OTHER): Payer: Medicare Other | Admitting: Urology

## 2016-01-01 VITALS — BP 131/70 | HR 56 | Ht 63.5 in | Wt 171.4 lb

## 2016-01-01 DIAGNOSIS — R32 Unspecified urinary incontinence: Secondary | ICD-10-CM | POA: Diagnosis not present

## 2016-01-01 LAB — PTNS-PERCUTANEOUS TIBIAL NERVE STIMULATION: SCAN RESULT: 18

## 2016-01-01 NOTE — Progress Notes (Signed)
Chief Complaint:  Chief Complaint  Patient presents with  . Urinary Incontinence    PTNS     HPI: Patient is an 82 year old Caucasian female with a history of recurrent UTI's and urinary incontinence who presents today for her 3rd  PTNS.  Her symptoms began several years ago.   The incontinence is not related to a specific event.  She experiences large leaks.  The leakage occurs day and night.  She experiences leakage with coughing, sneezing, or laughing.  She is experiencing leakage 5 times daily.  She is experiencing daytime frequency of 10 times daily.  She is experiencing nighttime frequency of 3 times nightly.  She is not having pain or burning with urination.   She is having a mild urgency.  She uses heavy pads for protection.  She is wearing two to three pads daily.  She is consuming one cup of coffee daily.    After her last treatment, she is experiencing 10 daytime voids (stable), 2 night time voids (stable) and 5 incontinence episodes daily (stable).        Previous Therapy: She has not tried any previous therapies.  She is not a candidate for anticholinergics due to her memory loss and age.  She is not a candidate for Myrbetriq due to her uncontrolled hypertension.  BP 131/70 mmHg  Pulse 56  Ht 5' 3.5" (1.613 m)  Wt 171 lb 6.4 oz (77.747 kg)  BMI 29.88 kg/m2  She does not have contraindications present for PTNS, such as:       Pacemaker      Implantable defibrillator      History of abnormal bleeding      History of neuropathies or nerve damage  Discussed with patient possible complications of procedure, such as discomfort, bleeding at insertion/stimulation site, procedure consent signed  Patient goals: Her goals for therapy are a reduction in incontinence.  PTNS treatment: The needle electrode was inserted into the lower inner aspect of the patient's left leg. The surface electrode was placed on the inside arch of the foot on the treatment leg. The lead set was connected  to the stimulator and the needle electrode clip was connected to the needle electrode. The stimulator that produces an adjustable electrical pulse that travels to the sacral nerve plexus via the tibial nerve was increased to 18 and until the patient noted a toe flex.  Treatment Plan:   Patient tolerated the PTNS therapy for 30 minutes. The electrode was removed without difficulty to the patient from the left leg. She will return in 1 week for her 4th  PTNS

## 2016-01-08 ENCOUNTER — Ambulatory Visit: Payer: Medicare Other | Admitting: Urology

## 2016-01-15 ENCOUNTER — Encounter: Payer: Self-pay | Admitting: Urology

## 2016-01-15 ENCOUNTER — Ambulatory Visit (INDEPENDENT_AMBULATORY_CARE_PROVIDER_SITE_OTHER): Payer: Medicare Other | Admitting: Urology

## 2016-01-15 VITALS — BP 120/65 | HR 60 | Ht 63.5 in | Wt 167.7 lb

## 2016-01-15 DIAGNOSIS — R32 Unspecified urinary incontinence: Secondary | ICD-10-CM

## 2016-01-15 LAB — PTNS-PERCUTANEOUS TIBIAL NERVE STIMULATION: Scan Result: 18

## 2016-01-15 NOTE — Progress Notes (Signed)
Chief Complaint:  Chief Complaint  Patient presents with  . Urinary Incontinence    PTNS     HPI: Patient is an 82 year old Caucasian female with a history of recurrent UTI's and urinary incontinence who presents today for her 4th  PTNS.  Her symptoms began several years ago.   The incontinence is not related to a specific event.  She experiences large leaks.  The leakage occurs day and night.  She experiences leakage with coughing, sneezing, or laughing.  She is experiencing leakage 5 times daily.  She is experiencing daytime frequency of 10 times daily.  She is experiencing nighttime frequency of 3 times nightly.  She is not having pain or burning with urination.   She is having a mild urgency.  She uses heavy pads for protection.  She is wearing two to three pads daily.  She is consuming one cup of coffee daily.    After her last treatment, she is experiencing 10 daytime voids (stable), 1 night time voids (improved) and 5 incontinence episodes daily (stable).        Previous Therapy: She has not tried any previous therapies.  She is not a candidate for anticholinergics due to her memory loss and age.  She is not a candidate for Myrbetriq due to her uncontrolled hypertension.  BP 120/65 mmHg  Pulse 60  Ht 5' 3.5" (1.613 m)  Wt 167 lb 11.2 oz (76.068 kg)  BMI 29.24 kg/m2  She does not have contraindications present for PTNS, such as:       Pacemaker      Implantable defibrillator      History of abnormal bleeding      History of neuropathies or nerve damage  Discussed with patient possible complications of procedure, such as discomfort, bleeding at insertion/stimulation site, procedure consent signed  Patient goals: Her goals for therapy are a reduction in incontinence.  PTNS treatment: The needle electrode was inserted into the lower inner aspect of the patient's left leg. The surface electrode was placed on the inside arch of the foot on the treatment leg. The lead set was  connected to the stimulator and the needle electrode clip was connected to the needle electrode. The stimulator that produces an adjustable electrical pulse that travels to the sacral nerve plexus via the tibial nerve was increased to 18 and until the patient noted a sensory response.  Treatment Plan:   Patient tolerated the PTNS therapy for 30 minutes. The electrode was removed without difficulty to the patient from the left leg. She will return in 1 week for her 5th  PTNS

## 2016-01-22 ENCOUNTER — Ambulatory Visit (INDEPENDENT_AMBULATORY_CARE_PROVIDER_SITE_OTHER): Payer: Medicare Other | Admitting: Urology

## 2016-01-22 VITALS — BP 174/71 | HR 55 | Ht 63.5 in | Wt 168.7 lb

## 2016-01-22 DIAGNOSIS — R32 Unspecified urinary incontinence: Secondary | ICD-10-CM

## 2016-01-22 NOTE — Progress Notes (Signed)
PTNS  Session # 5  Health & Social Factors: same Caffeine: 1 Alcohol: 0 Daytime voids #per day: 9 Night-time voids #per night: 2 Urgency: mild-strong Incontinence Episodes #per day: 5 Ankle used: left  Treatment Setting: 18 Feeling/ Response: toe flex Comments: none  Preformed By: Michiel Cowboy, PAC Assistant: Eligha Bridegroom, CMA  Follow Up: 1week for PTNS #6

## 2016-01-24 ENCOUNTER — Encounter: Payer: Self-pay | Admitting: Urology

## 2016-01-24 NOTE — Progress Notes (Signed)
Chief Complaint:  Chief Complaint  Patient presents with  . PTNS     HPI: Patient is an 82 year old Caucasian female with a history of recurrent UTI's and urinary incontinence who presents today for her 5th  PTNS.  Her symptoms began several years ago.   The incontinence is not related to a specific event.  She experiences large leaks.  The leakage occurs day and night.  She experiences leakage with coughing, sneezing, or laughing.  She is experiencing leakage 5 times daily.  She is experiencing daytime frequency of 10 times daily.  She is experiencing nighttime frequency of 3 times nightly.  She is not having pain or burning with urination.   She is having a mild urgency.  She uses heavy pads for protection.  She is wearing two to three pads daily.  She is consuming one cup of coffee daily.    After her last treatment, she is experiencing 9 daytime voids (improvement), 2 night time voids (worsening) and 5 incontinence episodes daily (stable).        Previous Therapy: She has not tried any previous therapies.  She is not a candidate for anticholinergics due to her memory loss and age.  She is not a candidate for Myrbetriq due to her uncontrolled hypertension.  BP 174/71 mmHg  Pulse 55  Ht 5' 3.5" (1.613 m)  Wt 168 lb 11.2 oz (76.522 kg)  BMI 29.41 kg/m2  She does not have contraindications present for PTNS, such as:       Pacemaker      Implantable defibrillator      History of abnormal bleeding      History of neuropathies or nerve damage  Discussed with patient possible complications of procedure, such as discomfort, bleeding at insertion/stimulation site, procedure consent signed  Patient goals: Her goals for therapy are a reduction in incontinence.  PTNS treatment: The needle electrode was inserted into the lower inner aspect of the patient's left leg. The surface electrode was placed on the inside arch of the foot on the treatment leg. The lead set was connected to the  stimulator and the needle electrode clip was connected to the needle electrode. The stimulator that produces an adjustable electrical pulse that travels to the sacral nerve plexus via the tibial nerve was increased to 18 and until the patient noted a toe flex response.  Treatment Plan:   Patient tolerated the PTNS therapy for 30 minutes. The electrode was removed without difficulty to the patient from the left leg. She will return in 1 week for her 6th  PTNS

## 2016-01-29 ENCOUNTER — Encounter: Payer: Self-pay | Admitting: Urology

## 2016-01-29 ENCOUNTER — Ambulatory Visit (INDEPENDENT_AMBULATORY_CARE_PROVIDER_SITE_OTHER): Payer: Medicare Other | Admitting: Urology

## 2016-01-29 VITALS — BP 137/68 | HR 65 | Ht 65.0 in | Wt 168.8 lb

## 2016-01-29 DIAGNOSIS — R32 Unspecified urinary incontinence: Secondary | ICD-10-CM | POA: Diagnosis not present

## 2016-01-29 LAB — PTNS-PERCUTANEOUS TIBIAL NERVE STIMULATION: SCAN RESULT: 18

## 2016-01-30 NOTE — Progress Notes (Signed)
Chief Complaint:  Chief Complaint  Patient presents with  . Urinary Incontinence    PTNS     HPI: Patient is an 82 year old Caucasian female with a history of recurrent UTI's and urinary incontinence who presents today for her 6th  PTNS.  Her symptoms began several years ago.   The incontinence is not related to a specific event.  She experiences large leaks.  The leakage occurs day and night.  She experiences leakage with coughing, sneezing, or laughing.  She is experiencing leakage 5 times daily.  She is experiencing daytime frequency of 10 times daily.  She is experiencing nighttime frequency of 3 times nightly.  She is not having pain or burning with urination.   She is having a mild urgency.  She uses heavy pads for protection.  She is wearing two to three pads daily.  She is consuming one cup of coffee daily.    After her last treatment, she is experiencing 9 daytime voids (unchanged), 2 night time voids (unchanged) and 5 incontinence episodes daily (worse).        Previous Therapy: She has not tried any previous therapies.  She is not a candidate for anticholinergics due to her memory loss and age.  She is not a candidate for Myrbetriq due to her uncontrolled hypertension.  BP 137/68 mmHg  Pulse 65  Ht  (1.651 m)  Wt 168 lb 12.8 oz (76.567 kg)  BMI 28.09 kg/m2  She does not have contraindications present for PTNS, such as:       Pacemaker      Implantable defibrillator      History of abnormal bleeding      History of neuropathies or nerve damage  Discussed with patient possible complications of procedure, such as discomfort, bleeding at insertion/stimulation site, procedure consent signed  Patient goals: Her goals for therapy are a reduction in incontinence.  PTNS treatment: The needle electrode was inserted into the lower inner aspect of the patient's left leg. The surface electrode was placed on the inside arch of the foot on the treatment leg. The lead set was  connected to the stimulator and the needle electrode clip was connected to the needle electrode. The stimulator that produces an adjustable electrical pulse that travels to the sacral nerve plexus via the tibial nerve was increased to 18 and until the patient noted a toe flex response.  Treatment Plan:   Patient tolerated the PTNS therapy for 30 minutes. The electrode was removed without difficulty to the patient from the left leg. She will return in 1 week for her 7th  PTNS

## 2016-02-05 ENCOUNTER — Encounter: Payer: Self-pay | Admitting: Obstetrics and Gynecology

## 2016-02-05 ENCOUNTER — Ambulatory Visit (INDEPENDENT_AMBULATORY_CARE_PROVIDER_SITE_OTHER): Payer: Medicare Other | Admitting: Obstetrics and Gynecology

## 2016-02-05 VITALS — BP 164/70 | HR 58 | Resp 16 | Ht 64.0 in | Wt 169.7 lb

## 2016-02-05 DIAGNOSIS — R32 Unspecified urinary incontinence: Secondary | ICD-10-CM | POA: Diagnosis not present

## 2016-02-05 LAB — PTNS-PERCUTANEOUS TIBIAL NERVE STIMULATION: SCAN RESULT: 13

## 2016-02-05 NOTE — Progress Notes (Signed)
PTNS  Session # 7  Health & Social Factors: No Change Caffeine: 1 Alcohol: 0 Daytime voids #per day: 11 Night-time voids #per night: 1 Urgency: Strong Incontinence Episodes #per day: 3 Ankle used: Right Treatment Setting: 13 Feeling/ Response: Sensory Comments: n/a  Preformed By: Earlie LouLindsay Overton, FNP  Assistant: Natividad BroodSteve Sameeha Rockefeller, CMA  Follow Up: 02/12/2016

## 2016-02-06 NOTE — Progress Notes (Signed)
Chief Complaint:  Chief Complaint  Patient presents with  . Urinary Incontinence  . PTNS     HPI: Patient is an 82 year old Caucasian female with a history of recurrent UTI's and urinary incontinence who presents today for her 6th  PTNS.  Her symptoms began several years ago.   The incontinence is not related to a specific event.  She experiences large leaks.  The leakage occurs day and night.  She experiences leakage with coughing, sneezing, or laughing.  She is experiencing leakage 5 times daily.  She is experiencing daytime frequency of 10 times daily.  She is experiencing nighttime frequency of 3 times nightly.  She is not having pain or burning with urination.   She is having a mild urgency.  She uses heavy pads for protection.  She is wearing two to three pads daily.  She is consuming one cup of coffee daily.    After her last treatment, she is experiencing 11 daytime voids (increased), 1 night time voids (decreased) and 3 incontinence episodes daily (improved).        Previous Therapy: She has not tried any previous therapies.  She is not a candidate for anticholinergics due to her memory loss and age.  She is not a candidate for Myrbetriq due to her uncontrolled hypertension.  BP 164/70 mmHg  Pulse 58  Resp 16  Ht 5\' 4"  (1.626 m)  Wt 169 lb 11.2 oz (76.975 kg)  BMI 29.11 kg/m2  She does not have contraindications present for PTNS, such as:       Pacemaker      Implantable defibrillator      History of abnormal bleeding      History of neuropathies or nerve damage  Discussed with patient possible complications of procedure, such as discomfort, bleeding at insertion/stimulation site, procedure consent signed  Patient goals: Her goals for therapy are a reduction in incontinence.  PTNS treatment: The needle electrode was inserted into the lower inner aspect of the patient's left leg. The surface electrode was placed on the inside arch of the foot on the treatment leg. The lead  set was connected to the stimulator and the needle electrode clip was connected to the needle electrode. The stimulator that produces an adjustable electrical pulse that travels to the sacral nerve plexus via the tibial nerve was increased to 13 and until the patient noted a toe flex response.  Treatment Plan:   Patient tolerated the PTNS therapy for 30 minutes. The electrode was removed without difficulty to the patient from the left leg. She will return in 1 week for her 8th  PTNS

## 2016-02-12 ENCOUNTER — Encounter: Payer: Self-pay | Admitting: Urology

## 2016-02-12 ENCOUNTER — Ambulatory Visit (INDEPENDENT_AMBULATORY_CARE_PROVIDER_SITE_OTHER): Payer: Medicare Other | Admitting: Urology

## 2016-02-12 VITALS — BP 145/69 | HR 68 | Ht 65.0 in | Wt 168.3 lb

## 2016-02-12 DIAGNOSIS — R350 Frequency of micturition: Secondary | ICD-10-CM

## 2016-02-12 NOTE — Progress Notes (Signed)
PTNS  Session # 8  Health & Social Factors: no change Caffeine: 1 Alcohol: 0 Daytime voids #per day: 9 Night-time voids #per night: 1 Urgency: strong Incontinence Episodes #per day: none Ankle used: left Treatment Setting: 19 Feeling/ Response: sensory  Preformed By: Michiel CowboyShannon McGowan, PA  Assistant: Rupert Stackshelsea Watkins, LPN

## 2016-02-13 NOTE — Progress Notes (Signed)
Chief Complaint:  Chief Complaint  Patient presents with  . PTNS     HPI: Patient is an 82 year old Caucasian female with a history of recurrent UTI's and urinary incontinence who presents today for her 8th  PTNS.  Her symptoms began several years ago.   The incontinence is not related to a specific event.  She experiences large leaks.  The leakage occurs day and night.  She experiences leakage with coughing, sneezing, or laughing.  She is experiencing leakage 5 times daily.  She is experiencing daytime frequency of 10 times daily.  She is experiencing nighttime frequency of 3 times nightly.  She is not having pain or burning with urination.   She is having a mild urgency.  She uses heavy pads for protection.  She is wearing two to three pads daily.  She is consuming one cup of coffee daily.    After her last treatment, she is experiencing 9 daytime voids (unchanged), 1 night time voids (improved) and 5 incontinence episodes daily (unchanged).        Previous Therapy: She has not tried any previous therapies.  She is not a candidate for anticholinergics due to her memory loss and age.  She is not a candidate for Myrbetriq due to her uncontrolled hypertension.  BP 145/69 mmHg  Pulse 68  Ht 5\' 5"  (1.651 m)  Wt 168 lb 4.8 oz (76.34 kg)  BMI 28.01 kg/m2  She does not have contraindications present for PTNS, such as:       Pacemaker      Implantable defibrillator      History of abnormal bleeding      History of neuropathies or nerve damage  Discussed with patient possible complications of procedure, such as discomfort, bleeding at insertion/stimulation site, procedure consent signed  Patient goals: Her goals for therapy are a reduction in incontinence.  PTNS treatment: The needle electrode was inserted into the lower inner aspect of the patient's left leg. The surface electrode was placed on the inside arch of the foot on the treatment leg. The lead set was connected to the stimulator  and the needle electrode clip was connected to the needle electrode. The stimulator that produces an adjustable electrical pulse that travels to the sacral nerve plexus via the tibial nerve was increased to 19 and until the patient noted a sensory response.  Treatment Plan:   Patient tolerated the PTNS therapy for 30 minutes. The electrode was removed without difficulty to the patient from the left leg. She will return in 1 week for her 9th  PTNS

## 2016-02-19 ENCOUNTER — Encounter: Payer: Self-pay | Admitting: Urology

## 2016-02-19 ENCOUNTER — Ambulatory Visit (INDEPENDENT_AMBULATORY_CARE_PROVIDER_SITE_OTHER): Payer: Medicare Other | Admitting: Urology

## 2016-02-19 VITALS — BP 175/74 | HR 59 | Ht 65.0 in | Wt 169.1 lb

## 2016-02-19 DIAGNOSIS — R32 Unspecified urinary incontinence: Secondary | ICD-10-CM | POA: Diagnosis not present

## 2016-02-19 LAB — PTNS-PERCUTANEOUS TIBIAL NERVE STIMULATION: SCAN RESULT: 11

## 2016-02-19 NOTE — Progress Notes (Signed)
Chief Complaint:  Chief Complaint  Patient presents with  . Urinary Incontinence    PTNS     HPI: Patient is an 82 year old Caucasian female with a history of recurrent UTI's and urinary incontinence who presents today for her 9th PTNS.  Her symptoms began several years ago.   The incontinence is not related to a specific event.  She experiences large leaks.  The leakage occurs day and night.  She experiences leakage with coughing, sneezing, or laughing.  She is experiencing leakage 5 times daily.  She is experiencing daytime frequency of 10 times daily.  She is experiencing nighttime frequency of 3 times nightly.  She is not having pain or burning with urination.   She is having a mild urgency.  She uses heavy pads for protection.  She is wearing two to three pads daily.  She is consuming one cup of coffee daily.    After her last treatment, she is experiencing 10 daytime voids (worse), 1 night time voids (stable) and 10 incontinence episodes daily (worsening).        Previous Therapy: She has not tried any previous therapies.  She is not a candidate for anticholinergics due to her memory loss and age.  She is not a candidate for Myrbetriq due to her uncontrolled hypertension.  BP 175/74 mmHg  Pulse 59  Ht 5\' 5"  (1.651 m)  Wt 169 lb 1.6 oz (76.703 kg)  BMI 28.14 kg/m2  She does not have contraindications present for PTNS, such as:       Pacemaker      Implantable defibrillator      History of abnormal bleeding      History of neuropathies or nerve damage  Discussed with patient possible complications of procedure, such as discomfort, bleeding at insertion/stimulation site, procedure consent signed  Patient goals: Her goals for therapy are a reduction in incontinence.  PTNS treatment: The needle electrode was inserted into the lower inner aspect of the patient's left leg. The surface electrode was placed on the inside arch of the foot on the treatment leg. The lead set was connected  to the stimulator and the needle electrode clip was connected to the needle electrode. The stimulator that produces an adjustable electrical pulse that travels to the sacral nerve plexus via the tibial nerve was increased to 11 and until the patient noted a sensory response.  Treatment Plan:   Patient tolerated the PTNS therapy for 30 minutes. The electrode was removed without difficulty to the patient from the left leg. She will return in 1 week for her 10th  PTNS

## 2016-02-26 ENCOUNTER — Ambulatory Visit (INDEPENDENT_AMBULATORY_CARE_PROVIDER_SITE_OTHER): Payer: Medicare Other | Admitting: Urology

## 2016-02-26 ENCOUNTER — Encounter: Payer: Self-pay | Admitting: Urology

## 2016-02-26 VITALS — BP 104/62 | HR 59 | Ht 60.0 in | Wt 165.7 lb

## 2016-02-26 DIAGNOSIS — R32 Unspecified urinary incontinence: Secondary | ICD-10-CM | POA: Diagnosis not present

## 2016-02-26 DIAGNOSIS — R300 Dysuria: Secondary | ICD-10-CM | POA: Diagnosis not present

## 2016-02-26 LAB — PTNS-PERCUTANEOUS TIBIAL NERVE STIMULATION: SCAN RESULT: 19

## 2016-02-27 NOTE — Progress Notes (Signed)
Chief Complaint:  Chief Complaint  Patient presents with  . Urinary Incontinence    PTNS     HPI: Patient is an 82 year old Caucasian female with a history of recurrent UTI's and urinary incontinence who presents today for her 10th PTNS.  Her symptoms began several years ago.   The incontinence is not related to a specific event.  She experiences large leaks.  The leakage occurs day and night.  She experiences leakage with coughing, sneezing, or laughing.  She is experiencing leakage 5 times daily.  She is experiencing daytime frequency of 10 times daily.  She is experiencing nighttime frequency of 3 times nightly.  She is not having pain or burning with urination.   She is having a mild urgency.  She uses heavy pads for protection.  She is wearing two to three pads daily.  She is consuming one cup of coffee daily.    After her last treatment, she is experiencing 15 daytime voids (worse), 3 night time voids (stable) and 12 incontinence episodes daily (worsening).        Previous Therapy: She has not tried any previous therapies.  She is not a candidate for anticholinergics due to her memory loss and age.  She is not a candidate for Myrbetriq due to her uncontrolled hypertension.  BP 104/62 mmHg  Pulse 59  Ht 5' (1.524 m)  Wt 165 lb 11.2 oz (75.161 kg)  BMI 32.36 kg/m2  She does not have contraindications present for PTNS, such as:       Pacemaker      Implantable defibrillator      History of abnormal bleeding      History of neuropathies or nerve damage  Discussed with patient possible complications of procedure, such as discomfort, bleeding at insertion/stimulation site, procedure consent signed  Patient goals: Her goals for therapy are a reduction in incontinence.  PTNS treatment: The needle electrode was inserted into the lower inner aspect of the patient's right leg. The surface electrode was placed on the inside arch of the foot on the treatment leg. The lead set was connected  to the stimulator and the needle electrode clip was connected to the needle electrode. The stimulator that produces an adjustable electrical pulse that travels to the sacral nerve plexus via the tibial nerve was increased to 19 and until the patient noted a sensory response.  Treatment Plan:   Patient tolerated the PTNS therapy for 30 minutes. The electrode was removed without difficulty to the patient from the left leg. She will return in 1 week for her 12th  PTNS

## 2016-02-28 LAB — CULTURE, URINE COMPREHENSIVE

## 2016-03-02 ENCOUNTER — Other Ambulatory Visit: Payer: Self-pay | Admitting: Urology

## 2016-03-02 ENCOUNTER — Ambulatory Visit (INDEPENDENT_AMBULATORY_CARE_PROVIDER_SITE_OTHER): Payer: Medicare Other

## 2016-03-02 ENCOUNTER — Telehealth: Payer: Self-pay

## 2016-03-02 DIAGNOSIS — N390 Urinary tract infection, site not specified: Secondary | ICD-10-CM

## 2016-03-02 MED ORDER — AMOXICILLIN-POT CLAVULANATE 875-125 MG PO TABS: 1.0000 | ORAL_TABLET | Freq: Two times a day (BID) | ORAL | Status: DC

## 2016-03-02 NOTE — Telephone Encounter (Signed)
-----   Message from Harle BattiestShannon A McGowan, PA-C sent at 03/01/2016 10:18 AM EDT ----- Patient will need a cath specimen for repeat UA and cultures.  We can obtain one when she returns for her next PTNS.

## 2016-03-02 NOTE — Telephone Encounter (Signed)
Patient will need to come in for a cath specimen for UA and culture.

## 2016-03-02 NOTE — Telephone Encounter (Signed)
Spoke with pt in reference to dysuria. Pt will RTC today for cath specimen.

## 2016-03-02 NOTE — Telephone Encounter (Signed)
Spoke with pt husband in reference to needing a cath specimen. Husband stated that pt is in severe discomfort and has cried all weekend. Husband stated that all weekend pt was not able to urinate well due to the burning on urination. Can we do a cath specimen today? Please advise.

## 2016-03-02 NOTE — Progress Notes (Signed)
In and Out Catheterization  Patient is present today for a I & O catheterization due to possible UTI. Patient was cleaned and prepped in a sterile fashion with betadine and Lidocaine 2% jelly was instilled into the urethra.  A 14FR cath was inserted no complications were noted , 50ml of urine return was noted, urine was cloudy and orange in color. A clean urine sample was collected for u/a and cx. Bladder was drained  And catheter was removed with out difficulty.    Preformed by: Rupert Stackshelsea Sharee Sturdy, LPN   Follow up/ Additional notes: pt will f/u in wed for PTNS. Pt is having severe dysuria.

## 2016-03-03 LAB — URINALYSIS, COMPLETE
BILIRUBIN UA: NEGATIVE
Nitrite, UA: POSITIVE — AB
SPEC GRAV UA: 1.02 (ref 1.005–1.030)
UUROB: 1 mg/dL (ref 0.2–1.0)
pH, UA: 6.5 (ref 5.0–7.5)

## 2016-03-03 LAB — MICROSCOPIC EXAMINATION
Epithelial Cells (non renal): NONE SEEN /hpf (ref 0–10)
RBC, UA: NONE SEEN /hpf (ref 0–?)

## 2016-03-04 ENCOUNTER — Ambulatory Visit (INDEPENDENT_AMBULATORY_CARE_PROVIDER_SITE_OTHER): Payer: Medicare Other | Admitting: Urology

## 2016-03-04 ENCOUNTER — Encounter: Payer: Self-pay | Admitting: Urology

## 2016-03-04 VITALS — BP 154/80 | HR 67 | Ht 65.0 in | Wt 165.0 lb

## 2016-03-04 DIAGNOSIS — L03115 Cellulitis of right lower limb: Secondary | ICD-10-CM | POA: Diagnosis not present

## 2016-03-04 DIAGNOSIS — R32 Unspecified urinary incontinence: Secondary | ICD-10-CM | POA: Diagnosis not present

## 2016-03-04 NOTE — Progress Notes (Signed)
03/04/2016 4:56 PM   Pamela [name] 04-18-1934 [id number]  Referring provider: Mick Sell, MD [address] [place], [place] [address]  Chief Complaint  Patient presents with  . Urinary Incontinence    PTNS    HPI: Patient is an 82 year old Caucasian female who presented today for a PTNS therapy.  When she took off her stockings, it was noted that her right lower leg was erythematous.  She states it has been that way for 2 days.  She had slipped on some olive oil in her kitchen 2 days ago and feels she may have injured her leg.  She is not having fevers, chills, nausea or vomiting.    After her last visit, she called back and stated she was having intense dysuria.  Her cath UA on 03/02/2016 was nitrite positive.  It was sent for culture and results are pending at this time.  She is currently on Augmentin 875/125 empirically while we are waiting on sensitives.     PMH: Past Medical History  Diagnosis Date  . Diabetes mellitus   . Hypertension   . Dysrhythmia   . High cholesterol   . A-fib (HCC) 08/28/2014    Overview:  Overview:  Diastolic dysfunction on ECHO 04/2012-done at [facility]   . Abnormal gait 02/08/2012  . Abscess or cellulitis of leg 04/18/2012  . Absence of bladder continence 07/29/2012  . Acid reflux 02/08/2012  . Acute kidney failure (HCC) 05/15/2012  . Afib, paroxysmal, with RVR at times with BBB 05/13/2012  . Allergic drug rash 05/15/2012  . Anemia 05/13/2012  . 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 Benign essential HTN 02/08/2012  . Bladder infection, chronic 06/29/2013  . BP (high blood pressure) 05/12/2012  . Bursitis of knee 08/07/2014  . Cellulitis in diabetic foot (HCC) 05/12/2012  . Chronic kidney disease 06/01/2012    Overview:  Overview:   12/2012- Renal US, simple L cyst. UIEP normal   . Clinical depression 08/28/2014  . CN (constipation) 03/17/2012  . Dermatitis due to drug reaction 05/15/2012  . Diastolic dysfunction, left ventricle 05/14/2012  . DM  (diabetes mellitus) (HCC) 05/12/2012  . Edema leg 07/02/2014  . Enthesopathy of knee 08/07/2014  . Excess fluid volume 05/20/2012  . FOM (frequency of micturition) 07/29/2012  . Frank hematuria 09/27/2014  . HTN (hypertension) 05/12/2012  . Hypercholesterolemia 05/12/2012  . Incomplete bladder emptying 07/29/2012  . Open wound of knee, leg (except thigh), and ankle 06/01/2012  . 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 Gangrene [facility])     Surgical History: Past Surgical History  Procedure Laterality Date  . Toe amputation    . Tonsillectomy    . Abdominal hysterectomy    . Cataract surgery    . Endovenous ablation saphenous vein w/ laser      Home Medications:    Medication List       This list is accurate as of: 03/04/16  4:56 PM.  Always use your most recent med list.               ALIGN 4 MG Caps  Take by mouth. Reported on 02/26/2016     amiodarone 200 MG tablet  Commonly known as:  PACERONE  TAKE 1 TABLET (200 MG TOTAL) BY MOUTH ONCE DAILY.     amitriptyline 10 MG tablet  Commonly known as:  ELAVIL     amoxicillin-clavulanate 875-125 MG tablet  Commonly known as:  AUGMENTIN  Take 1 tablet by mouth every 12 (twelve) hours.     atorvastatin 40 MG tablet  Commonly known as:  LIPITOR  Take 40 mg by mouth daily.     calcium carbonate 1250 (500 Ca) MG tablet  Commonly known as:  OS-CAL - dosed in mg of elemental calcium  Take 1 tablet (500 mg of elemental calcium total) by mouth 3 (three) times daily.     conjugated estrogens vaginal cream  Commonly known as:  PREMARIN  Place 1 Applicatorful vaginally daily.     glucose blood test strip  Reported on 12/18/2015     HUMALOG MIX 75/25 (75-25) 100 UNIT/ML Susp injection  Generic drug:  insulin lispro protamine-lispro     LEADER INSULIN SYRINGE 30G X 5/16" 0.5 ML Misc  Generic  drug:  Insulin Syringe-Needle U-100  Use as directed.     lisinopril 20 MG tablet  Commonly known as:  PRINIVIL,ZESTRIL  Take 20 mg by mouth daily.     magnesium 30 MG tablet  Take 30 mg by mouth 2 (two) times daily.     metFORMIN 1000 MG tablet  Commonly known as:  GLUCOPHAGE  Take 1,000 mg by mouth 2 (two) times daily.     metoCLOPramide 5 MG tablet  Commonly known as:  REGLAN  Take 1 tablet (5 mg total) by mouth 4 (four) times daily -  before meals and at bedtime.     metoprolol 50 MG tablet  Commonly known as:  LOPRESSOR  Take 1 tablet (50 mg total) by mouth 2 (two) times daily.     NOVOLOG MIX 70/30 FLEXPEN (70-30) 100 UNIT/ML FlexPen  Generic drug:  insulin aspart protamine - aspart  INJECT 20 UNITS SUBQ EVERY MORNING AND 18 UNITS DAILY AT 5PM     nystatin cream  Commonly known as:  MYCOSTATIN  Apply 1 application topically 2 (two) times daily.     pantoprazole 40 MG tablet  Commonly known as:  PROTONIX  Take 40 mg by mouth daily.     PHARMACIST CHOICE LANCETS Misc  USE 2 TIMES DAILY FOR DIABETIC TESTING     polyethylene glycol packet  Commonly known as:  MIRALAX / GLYCOLAX  Take by mouth. Reported on 12/18/2015     tamsulosin 0.4 MG Caps capsule  Commonly known as:  FLOMAX  Take 0.4 mg by mouth daily. Reported on 01/15/2016        Allergies:  Allergies  Allergen Reactions  . Ciprofloxacin Anaphylaxis  . Sulfamethoxazole-Trimethoprim Other (See Comments) and Rash    Went into [employer].  . Levofloxacin Other (See Comments)  . Losartan Potassium-Hctz Other (See Comments)    Other reaction(s): Unknown  . Nitrofurantoin Other (See Comments)  . Sulfa Antibiotics Other (See Comments)    Levonne Spiller Steven's Johnsons  . Sulfasalazine     unknown    Family History: Family History  Problem Relation Age of Onset  . Kidney disease Neg Hx   . Prostate cancer Neg Hx   . Bladder Cancer Neg Hx   . Diabetes Mellitus II Mother   . Diabetes  Paternal Grandmother   . Diabetes Maternal Grandmother   . Lung cancer Father   . Heart disease Mother     Social History:  reports that she has never smoked. She does not have any smokeless tobacco history on file. She reports that she does not drink  alcohol or use illicit drugs.  ROS: UROLOGY Frequent Urination?: Yes Hard to postpone urination?: Yes Burning/pain with urination?: No Get up at night to urinate?: Yes Leakage of urine?: Yes Urine stream starts and stops?: No Trouble starting stream?: No Do you have to strain to urinate?: No Blood in urine?: No Urinary tract infection?: Yes Sexually transmitted disease?: No Injury to kidneys or bladder?: No Painful intercourse?: No Weak stream?: Yes Currently pregnant?: No Vaginal bleeding?: No Last menstrual period?: n  Gastrointestinal Nausea?: No Vomiting?: No Indigestion/heartburn?: No Diarrhea?: No Constipation?: Yes  Constitutional Fever: No Night sweats?: No Weight loss?: No Fatigue?: Yes  Skin Skin rash/lesions?: No Itching?: No  Eyes Blurred vision?: No Double vision?: No  Ears/Nose/Throat Sore throat?: No Sinus problems?: No  Hematologic/Lymphatic Swollen glands?: No Easy bruising?: Yes  Cardiovascular Leg swelling?: No Chest pain?: No  Respiratory Cough?: No Shortness of breath?: No  Endocrine Excessive thirst?: Yes  Musculoskeletal Back pain?: No Joint pain?: No  Neurological Headaches?: No Dizziness?: No  Psychologic Depression?: No Anxiety?: No  Physical Exam: BP 154/80 mmHg  Pulse 67  Ht 5\' 5"  (1.651 m)  Wt 165 lb (74.844 kg)  BMI 27.46 kg/m2  Constitutional: Well nourished. Alert and oriented, No acute distress. HEENT: [place] AT, moist mucus membranes. Trachea midline, no masses. Cardiovascular: No clubbing, cyanosis, or edema. Respiratory: Normal respiratory effort, no increased work of breathing. GI: Abdomen is soft, non tender, non distended, no abdominal masses.  Liver and spleen not palpable.  No hernias appreciated.  Stool sample for occult testing is not indicated.   GU: No CVA tenderness.  No bladder fullness or masses.   Skin: Patient with extensive, brawny, painful, sharply defined, erythematous edema of the lower right leg and ankle.  The portal of entry may be from a scratch located on the lateral side of the malleolus. Lymph: No cervical or inguinal adenopathy. Neurologic: Grossly intact, no focal deficits, moving all 4 extremities. Psychiatric: Normal mood and affect.  Laboratory Data: Lab Results  Component Value Date   WBC 7.8 01/07/2015   HGB 8.4* 01/07/2015   HCT 26.7* 01/07/2015   MCV 98 01/07/2015   PLT 166 01/07/2015    Lab Results  Component Value Date   CREATININE 2.28* 01/07/2015    Lab Results  Component Value Date   HGBA1C 7.6* 05/13/2012    Lab Results  Component Value Date   TSH 2.546 05/19/2012       Component Value Date/Time   CHOL 119 07/08/2013 0739   HDL 58 07/08/2013 0739   VLDL 22 07/08/2013 0739   LDLCALC 39 07/08/2013 0739    Lab Results  Component Value Date   AST 28 01/07/2015   Lab Results  Component Value Date   ALT 27 01/07/2015      Assessment & Plan:    1. Urinary incontinence:   PTNS was not performed today due to the cellulitis.  We may need to postpone the therapy at this time until the cellulitis resolves or use the other leg.    2. Cellulitis:   Patient has a history of gangrene.  I have spoken with Gearldine BienenstockBrandy at Dr. Jarrett AblesFitzgerald's office.  She is seeing [facility] Orland Jarredroxler [facility] in [place] and Dr. Sampson GoonFitzgerald will look at the patient while she is in his office.     Return for follow up pending Dr. Jarrett AblesFitzgerald's recommendations.  These notes generated with voice recognition software. I apologize for typographical errors.  Michiel CowboySHANNON Luciel Brickman, PA-C  [facility]  [address] [place], [place] [address] [phone]

## 2016-03-05 ENCOUNTER — Telehealth: Payer: Self-pay | Admitting: Urology

## 2016-03-05 NOTE — Telephone Encounter (Signed)
Would you call the patient and see if she saw Dr. Sampson GoonFitzgerald yesterday?

## 2016-03-05 NOTE — Telephone Encounter (Signed)
No answer

## 2016-03-06 ENCOUNTER — Telehealth: Payer: Self-pay

## 2016-03-06 LAB — CULTURE, URINE COMPREHENSIVE

## 2016-03-06 NOTE — Telephone Encounter (Signed)
This was a cath specimen.

## 2016-03-06 NOTE — Telephone Encounter (Signed)
Okay.  We will need to check it again when she returns.  She is on Augmentin according to the chart, unless Dr. Sampson GoonFitzgerald changed it to treat her cellulitis.

## 2016-03-06 NOTE — Telephone Encounter (Signed)
-----   Message from Harle BattiestShannon A McGowan, PA-C sent at 03/06/2016 12:53 PM EDT ----- Did patient give us a catheter specimen?  If not, she will need to come in for a catheter UA to be sent for culture.

## 2016-03-11 ENCOUNTER — Ambulatory Visit (INDEPENDENT_AMBULATORY_CARE_PROVIDER_SITE_OTHER): Payer: Medicare Other | Admitting: Urology

## 2016-03-11 ENCOUNTER — Encounter: Payer: Self-pay | Admitting: Urology

## 2016-03-11 VITALS — BP 149/67 | HR 57 | Ht 65.0 in | Wt 167.0 lb

## 2016-03-11 DIAGNOSIS — R32 Unspecified urinary incontinence: Secondary | ICD-10-CM

## 2016-03-11 LAB — PTNS-PERCUTANEOUS TIBIAL NERVE STIMULATION: Scan Result: 19

## 2016-03-11 NOTE — Progress Notes (Signed)
3:03 PM  03/11/2016   Pamela [name] 12/17/1933 [id number]  Referring provider: Mick Sellavid P Fitzgerald, MD [address] [place], [place] [id number]  Chief Complaint  Patient presents with  . Urinary Incontinence    PTNS    HPI: 82 year old Caucasian female who presented today for a PTNS therapy.  She is being treated for left cellultis wearing an Radio broadcast assistantUnna boot.    Her PTNS last week was rescheduled due to cellulitis.    She was also treated in the interim for a UTI with Augmentin.     This will be her last treatment today.  PMH: Past Medical History  Diagnosis Date  . Diabetes mellitus   . Hypertension   . Dysrhythmia   . High cholesterol   . A-fib (HCC) 08/28/2014    Overview:  Overview:  Diastolic dysfunction on ECHO 04/2012-[facility]   . Abnormal gait 02/08/2012  . Abscess or cellulitis of leg 04/18/2012  . Absence of bladder continence 07/29/2012  . Acid reflux 02/08/2012  . Acute kidney failure (HCC) 05/15/2012  . Afib, paroxysmal, with RVR at times with BBB 05/13/2012  . Allergic drug rash 05/15/2012  . Anemia 05/13/2012  . 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 Benign essential HTN 02/08/2012  . Bladder infection, chronic 06/29/2013  . BP (high blood pressure) 05/12/2012  . Bursitis of knee 08/07/2014  . Cellulitis in diabetic foot (HCC) 05/12/2012  . Chronic kidney disease 06/01/2012    Overview:  Overview:   12/2012- Renal US, simple L cyst. UIEP normal   . Clinical depression 08/28/2014  . CN (constipation) 03/17/2012  . Dermatitis due to drug reaction 05/15/2012  . Diastolic dysfunction, left ventricle 05/14/2012  . DM (diabetes mellitus) (HCC) 05/12/2012  . Edema leg 07/02/2014  . Enthesopathy of knee 08/07/2014  . Excess fluid volume 05/20/2012  . FOM (frequency of micturition) 07/29/2012  . Frank hematuria 09/27/2014  . HTN (hypertension) 05/12/2012  . Hypercholesterolemia 05/12/2012  . Incomplete bladder emptying 07/29/2012  . Open wound of knee, leg (except thigh), and  ankle 06/01/2012  . 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 Gangrene [facility](HCC)     Surgical History: Past Surgical History  Procedure Laterality Date  . Toe amputation    . Tonsillectomy    . Abdominal hysterectomy    . Cataract surgery    . Endovenous ablation saphenous vein w/ laser      Home Medications:    Medication List       This list is accurate as of: 03/11/16  3:03 PM.  Always use your most recent med list.               ALIGN 4 MG Caps  Take by mouth. Reported on 02/26/2016     amiodarone 200 MG tablet  Commonly known as:  PACERONE  TAKE 1 TABLET (200 MG TOTAL) BY MOUTH ONCE DAILY.     amitriptyline 10 MG tablet  Commonly known as:  ELAVIL     amoxicillin-clavulanate 875-125 MG tablet  Commonly known as:  AUGMENTIN  Take 1 tablet by mouth every 12 (twelve) hours.     atorvastatin 40 MG tablet  Commonly known as:  LIPITOR  Take 40 mg by mouth daily.     calcium carbonate 1250 (500 Ca) MG tablet  Commonly known as:  OS-CAL - dosed in mg  of elemental calcium  Take 1 tablet (500 mg of elemental calcium total) by mouth 3 (three) times daily.     conjugated estrogens vaginal cream  Commonly known as:  PREMARIN  Place 1 Applicatorful vaginally daily.     glucose blood test strip  Reported on 12/18/2015     HUMALOG MIX 75/25 (75-25) 100 UNIT/ML Susp injection  Generic drug:  insulin lispro protamine-lispro     LEADER INSULIN SYRINGE 30G X 5/16" 0.5 ML Misc  Generic drug:  Insulin Syringe-Needle U-100  Use as directed.     lisinopril 20 MG tablet  Commonly known as:  PRINIVIL,ZESTRIL  Take 20 mg by mouth daily.     magnesium 30 MG tablet  Take 30 mg by mouth 2 (two) times daily.     metFORMIN 1000 MG tablet  Commonly known as:  GLUCOPHAGE  Take 1,000 mg by mouth 2 (two) times daily.     metoCLOPramide 5 MG  tablet  Commonly known as:  REGLAN  Take 1 tablet (5 mg total) by mouth 4 (four) times daily -  before meals and at bedtime.     metoprolol 50 MG tablet  Commonly known as:  LOPRESSOR  Take 1 tablet (50 mg total) by mouth 2 (two) times daily.     NOVOLOG MIX 70/30 FLEXPEN (70-30) 100 UNIT/ML FlexPen  Generic drug:  insulin aspart protamine - aspart  INJECT 20 UNITS SUBQ EVERY MORNING AND 18 UNITS DAILY AT 5PM     nystatin cream  Commonly known as:  MYCOSTATIN  Apply 1 application topically 2 (two) times daily.     pantoprazole 40 MG tablet  Commonly known as:  PROTONIX  Take 40 mg by mouth daily.     PHARMACIST CHOICE LANCETS Misc  USE 2 TIMES DAILY FOR DIABETIC TESTING     polyethylene glycol packet  Commonly known as:  MIRALAX / GLYCOLAX  Take by mouth. Reported on 12/18/2015        Allergies:  Allergies  Allergen Reactions  . Ciprofloxacin Anaphylaxis  . Sulfamethoxazole-Trimethoprim Other (See Comments) and Rash    Went into [employer].  . Levofloxacin Other (See Comments)  . Losartan Potassium-Hctz Other (See Comments)    Other reaction(s): Unknown  . Nitrofurantoin Other (See Comments)  . Sulfa Antibiotics Other (See Comments)    Levonne Spiller Steven's Johnsons  . Sulfasalazine     unknown    Family History: Family History  Problem Relation Age of Onset  . Kidney disease Neg Hx   . Prostate cancer Neg Hx   . Bladder Cancer Neg Hx   . Diabetes Mellitus II Mother   . Diabetes Paternal Grandmother   . Diabetes Maternal Grandmother   . Lung cancer Father   . Heart disease Mother     Social History:  reports that she has never smoked. She does not have any smokeless tobacco history on file. She reports that she does not drink alcohol or use illicit drugs.  ROS: UROLOGY Frequent Urination?: Yes Hard to postpone urination?: Yes Burning/pain with urination?: No Get up at night to urinate?: Yes Leakage of urine?: Yes Urine stream starts  and stops?: No Trouble starting stream?: No Do you have to strain to urinate?: No Blood in urine?: No Urinary tract infection?: No Sexually transmitted disease?: No Injury to kidneys or bladder?: No Painful intercourse?: No Weak stream?: No Currently pregnant?: No Vaginal bleeding?: No Last menstrual period?: n  Gastrointestinal Nausea?: No Vomiting?: No Indigestion/heartburn?: No Diarrhea?:  No Constipation?: Yes  Constitutional Fever: No Night sweats?: No Weight loss?: No Fatigue?: Yes  Skin Skin rash/lesions?: No Itching?: No  Eyes Blurred vision?: No Double vision?: No  Ears/Nose/Throat Sore throat?: No Sinus problems?: No  Hematologic/Lymphatic Swollen glands?: No Easy bruising?: No  Cardiovascular Leg swelling?: No Chest pain?: No  Respiratory Cough?: No Shortness of breath?: No  Endocrine Excessive thirst?: No  Musculoskeletal Back pain?: No Joint pain?: No  Neurological Headaches?: No Dizziness?: No  Psychologic Depression?: No Anxiety?: No  Physical Exam: BP 149/67 mmHg  Pulse 57  Ht  (1.651 m)  Wt 167 lb (75.751 kg)  BMI 27.79 kg/m2  Constitutional: Well nourished. Alert and oriented, No acute distress. HEENT: [place] AT, moist mucus membranes. Trachea midline, no masses. Cardiovascular: No clubbing, cyanosis, or edema. Respiratory: Normal respiratory effort, no increased work of breathing. GU: No CVA tenderness.  No bladder fullness or masses.   Skin: Wearing left Unna boot.   Neurologic: Grossly intact, no focal deficits, moving all 4 extremities. Psychiatric: Normal mood and affect.  Laboratory Data: Lab Results  Component Value Date   WBC 7.8 01/07/2015   HGB 8.4* 01/07/2015   HCT 26.7* 01/07/2015   MCV 98 01/07/2015   PLT 166 01/07/2015    Lab Results  Component Value Date   CREATININE 2.28* 01/07/2015    Lab Results  Component Value Date   HGBA1C 7.6* 05/13/2012    Lab Results  Component Value Date     TSH 2.546 05/19/2012       Component Value Date/Time   CHOL 119 07/08/2013 0739   HDL 58 07/08/2013 0739   VLDL 22 07/08/2013 0739   LDLCALC 39 07/08/2013 0739    Lab Results  Component Value Date   AST 28 01/07/2015   Lab Results  Component Value Date   ALT 27 01/07/2015     PROCEDURE:  She does not have contraindications present for PTNS, such as:   Pacemaker  Implantable defibrillator  History of abnormal bleeding  History of neuropathies or nerve damage  Discussed with patient possible complications of procedure, such as discomfort, bleeding at insertion/stimulation site, procedure consent signed  Patient goals: Her goals for therapy are a reduction in incontinence.  PTNS treatment: The needle electrode was inserted into the lower inner aspect of the patient's left leg. The surface electrode was placed on the inside arch of the foot on the treatment leg. The lead set was connected to the stimulator and the needle electrode clip was connected to the needle electrode. The stimulator that produces an adjustable electrical pulse that travels to the sacral nerve plexus via the tibial nerve was increased to 19 and until the patient noted a sensory response.  Treatment Plan:  Patient tolerated the PTNS therapy for 30 minutes. The electrode was removed without difficulty to the patient from the left leg.           Assessment & Plan:    1. Urinary incontinence:   S/p PTNS x 12 Plan for follow up with Carollee Herter in 1 month to reassess/ maintainance  2. Cellulitis:   Being treated.    Vanna Scotland, MD  [facility] [address], [address] [place], [place] [phone]

## 2016-03-11 NOTE — Progress Notes (Signed)
PTNS  Session # 11  Health & Social Factors: No Change Caffeine: 1 Alcohol: 0 Daytime voids #per day: 18 Night-time voids #per night: 2 Urgency: Strong Incontinence Episodes #per day: 12 Ankle used: Left Treatment Setting: 19 Feeling/ Response: Sensory Comments: Patient does not feel anything because of neuropathy in legs  Preformed By: Vanna ScotlandAshley Brandon MD  Assistant: Dallas Schimkeamona Williams CMA  Follow Up: One Month

## 2016-04-03 ENCOUNTER — Ambulatory Visit (INDEPENDENT_AMBULATORY_CARE_PROVIDER_SITE_OTHER): Payer: Medicare Other

## 2016-04-03 DIAGNOSIS — N390 Urinary tract infection, site not specified: Secondary | ICD-10-CM | POA: Diagnosis not present

## 2016-04-03 MED ORDER — AMOXICILLIN-POT CLAVULANATE 875-125 MG PO TABS
1.0000 | ORAL_TABLET | Freq: Two times a day (BID) | ORAL | Status: AC
Start: 2016-04-03 — End: 2016-04-10

## 2016-04-03 NOTE — Progress Notes (Signed)
In and Out Catheterization  Patient is present today for a I & O catheterization due to recurrent UTI. Patient was cleaned and prepped in a sterile fashion with betadine and Lidocaine 2% jelly was instilled into the urethra.  A 14FR cath was inserted no complications were noted , 50ml of urine return was noted, urine was cloudy and yellow in color. A clean urine sample was collected for u/a and cx. Bladder was drained  And catheter was removed with out difficulty.    Preformed by: Rupert Stackshelsea Dulcey Riederer, LPN     Pt called stating she has been burning on urination since Monday and when she attempts to urinate she only produces a couple of drops.  Per Dr. Apolinar JunesBrandon augmentin bid x3 days was given to pt. Once ucx results are back results will be evaluated.

## 2016-04-04 LAB — URINALYSIS, COMPLETE
Bilirubin, UA: NEGATIVE
Glucose, UA: NEGATIVE
NITRITE UA: POSITIVE — AB
PH UA: 7.5 (ref 5.0–7.5)
Specific Gravity, UA: 1.025 (ref 1.005–1.030)
UUROB: 1 mg/dL (ref 0.2–1.0)

## 2016-04-04 LAB — MICROSCOPIC EXAMINATION
EPITHELIAL CELLS (NON RENAL): NONE SEEN /HPF (ref 0–10)
RBC MICROSCOPIC, UA: NONE SEEN /HPF (ref 0–?)
WBC, UA: >30 /hpf — AB (ref 0–?)

## 2016-04-05 LAB — CULTURE, URINE COMPREHENSIVE

## 2016-04-08 ENCOUNTER — Ambulatory Visit (INDEPENDENT_AMBULATORY_CARE_PROVIDER_SITE_OTHER): Payer: Medicare Other | Admitting: Urology

## 2016-04-08 ENCOUNTER — Encounter: Payer: Self-pay | Admitting: Urology

## 2016-04-08 VITALS — BP 166/77 | HR 78 | Ht 66.0 in | Wt 168.8 lb

## 2016-04-08 DIAGNOSIS — R32 Unspecified urinary incontinence: Secondary | ICD-10-CM | POA: Diagnosis not present

## 2016-04-08 LAB — PTNS-PERCUTANEOUS TIBIAL NERVE STIMULATION: SCAN RESULT: 15

## 2016-04-08 NOTE — Progress Notes (Signed)
Chief Complaint:  Chief Complaint  Patient presents with  . Urinary Incontinence    PTNS     HPI: Patient is an 82 year old Caucasian female with a history of recurrent UTI's and urinary incontinence who presents today for a maintenance PTNS. Her symptoms began several years ago.   The incontinence is not related to a specific event.  She experiences large leaks.  The leakage occurs day and night.  She experiences leakage with coughing, sneezing, or laughing.  She is experiencing leakage 5 times daily.  She is experiencing daytime frequency of 10 times daily.  She is experiencing nighttime frequency of 3 times nightly.  She is not having pain or burning with urination.   She is having a mild urgency.  She uses heavy pads for protection.  She is wearing two to three pads daily.  She is consuming one cup of coffee daily.    After her last treatment, she is experiencing 18 daytime voids (worse), 2 night time voids (improvement) and 12 incontinence episodes daily (stable).        Previous Therapy: She has not tried any previous therapies.  She is not a candidate for anticholinergics due to her memory loss and age.  She is not a candidate for Myrbetriq due to her uncontrolled hypertension.  BP 166/77 mmHg  Pulse 78  Ht 5\' 6"  (1.676 m)  Wt 168 lb 12.8 oz (76.567 kg)  BMI 27.26 kg/m2  She does not have contraindications present for PTNS, such as:       Pacemaker      Implantable defibrillator      History of abnormal bleeding      History of neuropathies or nerve damage  Discussed with patient possible complications of procedure, such as discomfort, bleeding at insertion/stimulation site, procedure consent signed  Patient goals: Her goals for therapy are a reduction in incontinence.  PTNS treatment: The needle electrode was inserted into the lower inner aspect of the patient's light leg. The surface electrode was placed on the inside arch of the foot on the treatment leg. The lead set was  connected to the stimulator and the needle electrode clip was connected to the needle electrode. The stimulator that produces an adjustable electrical pulse that travels to the sacral nerve plexus via the tibial nerve was increased to 15 and until the patient noted a sensory response.  Treatment Plan:   Patient tolerated the PTNS therapy for 30 minutes. The electrode was removed without difficulty to the patient from the left leg. She will return in 1 month for her maintenance  PTNS

## 2016-05-05 ENCOUNTER — Ambulatory Visit (INDEPENDENT_AMBULATORY_CARE_PROVIDER_SITE_OTHER): Payer: Medicare Other

## 2016-05-05 VITALS — BP 126/69 | HR 54 | Temp 97.6°F | Wt 168.2 lb

## 2016-05-05 DIAGNOSIS — N390 Urinary tract infection, site not specified: Secondary | ICD-10-CM

## 2016-05-05 LAB — URINALYSIS, COMPLETE
BILIRUBIN UA: NEGATIVE
Glucose, UA: NEGATIVE
NITRITE UA: POSITIVE — AB
SPEC GRAV UA: 1.025 (ref 1.005–1.030)
Urobilinogen, Ur: 1 mg/dL (ref 0.2–1.0)
pH, UA: 7.5 (ref 5.0–7.5)

## 2016-05-05 LAB — MICROSCOPIC EXAMINATION
Epithelial Cells (non renal): NONE SEEN /hpf (ref 0–10)
WBC, UA: >30 /hpf — AB (ref 0–?)

## 2016-05-05 NOTE — Progress Notes (Signed)
In and Out Catheterization  Patient is present today for a I & O catheterization due to a possible UTI. Patient was cleaned and prepped in a sterile fashion with betadine and Lidocaine 2% jelly was instilled into the urethra.  A 14FR cath was inserted no complications were noted , 100ml of urine return was noted, urine was milky in color. A clean urine sample was collected for u/a and cx. Bladder was drained  And catheter was removed with out difficulty.    Preformed by: Rupert Stackshelsea Watkins, LPN

## 2016-05-07 LAB — CULTURE, URINE COMPREHENSIVE

## 2016-05-08 ENCOUNTER — Telehealth: Payer: Self-pay

## 2016-05-08 NOTE — Telephone Encounter (Signed)
-----   Message from Harle BattiestShannon A McGowan, PA-C sent at 05/07/2016  4:51 PM EDT ----- Patient has a positive urine culture.  She needs to see ID.  All the antibiotics that she is allergic to cause anaphylaxis or Andria MeuseStevens - Johnson syndrome.

## 2016-05-08 NOTE — Telephone Encounter (Signed)
Spoke with Gearldine BienenstockBrandy, Dr. Floreen ComberFitzgerlad's nurse, referral was made. Ucx results were faxed.

## 2016-05-13 ENCOUNTER — Encounter: Payer: Medicare Other | Admitting: Urology

## 2016-05-13 ENCOUNTER — Encounter: Payer: Self-pay | Admitting: Urology

## 2016-05-13 VITALS — BP 149/68 | HR 71 | Ht 65.0 in | Wt 169.4 lb

## 2016-05-13 DIAGNOSIS — N3941 Urge incontinence: Secondary | ICD-10-CM

## 2016-05-18 NOTE — Progress Notes (Signed)
This encounter was created in error - please disregard.

## 2016-05-19 ENCOUNTER — Encounter: Payer: Self-pay | Admitting: Emergency Medicine

## 2016-05-19 ENCOUNTER — Emergency Department
Admission: EM | Admit: 2016-05-19 | Discharge: 2016-05-19 | Disposition: A | Discharge status: Home / Self Care | Payer: Medicare Other | Attending: Emergency Medicine | Admitting: Emergency Medicine

## 2016-05-19 ENCOUNTER — Other Ambulatory Visit: Payer: Self-pay

## 2016-05-19 ENCOUNTER — Emergency Department: Payer: Medicare Other

## 2016-05-19 DIAGNOSIS — Y929 Unspecified place or not applicable: Secondary | ICD-10-CM | POA: Insufficient documentation

## 2016-05-19 DIAGNOSIS — E1122 Type 2 diabetes mellitus with diabetic chronic kidney disease: Secondary | ICD-10-CM | POA: Insufficient documentation

## 2016-05-19 DIAGNOSIS — F329 Major depressive disorder, single episode, unspecified: Secondary | ICD-10-CM | POA: Insufficient documentation

## 2016-05-19 DIAGNOSIS — Y999 Unspecified external cause status: Secondary | ICD-10-CM | POA: Insufficient documentation

## 2016-05-19 DIAGNOSIS — N189 Chronic kidney disease, unspecified: Secondary | ICD-10-CM | POA: Diagnosis not present

## 2016-05-19 DIAGNOSIS — Y939 Activity, unspecified: Secondary | ICD-10-CM | POA: Diagnosis not present

## 2016-05-19 DIAGNOSIS — I129 Hypertensive chronic kidney disease with stage 1 through stage 4 chronic kidney disease, or unspecified chronic kidney disease: Secondary | ICD-10-CM | POA: Diagnosis not present

## 2016-05-19 DIAGNOSIS — Z794 Long term (current) use of insulin: Secondary | ICD-10-CM | POA: Insufficient documentation

## 2016-05-19 DIAGNOSIS — Z7984 Long term (current) use of oral hypoglycemic drugs: Secondary | ICD-10-CM | POA: Insufficient documentation

## 2016-05-19 DIAGNOSIS — I480 Paroxysmal atrial fibrillation: Secondary | ICD-10-CM | POA: Insufficient documentation

## 2016-05-19 DIAGNOSIS — Z79899 Other long term (current) drug therapy: Secondary | ICD-10-CM | POA: Diagnosis not present

## 2016-05-19 DIAGNOSIS — W1830XA Fall on same level, unspecified, initial encounter: Secondary | ICD-10-CM | POA: Diagnosis not present

## 2016-05-19 DIAGNOSIS — S6992XA Unspecified injury of left wrist, hand and finger(s), initial encounter: Secondary | ICD-10-CM | POA: Diagnosis present

## 2016-05-19 DIAGNOSIS — S63592A Other specified sprain of left wrist, initial encounter: Secondary | ICD-10-CM | POA: Diagnosis not present

## 2016-05-19 DIAGNOSIS — W19XXXA Unspecified fall, initial encounter: Secondary | ICD-10-CM

## 2016-05-19 DIAGNOSIS — S63502A Unspecified sprain of left wrist, initial encounter: Secondary | ICD-10-CM

## 2016-05-19 NOTE — Discharge Instructions (Signed)
Cryotherapy °Cryotherapy means treatment with cold. Ice or gel packs can be used to reduce both pain and swelling. Ice is the most helpful within the first 24 to 48 hours after an injury or flare-up from overusing a muscle or joint. Sprains, strains, spasms, burning pain, shooting pain, and aches can all be eased with ice. Ice can also be used when recovering from surgery. Ice is effective, has very few side effects, and is safe for most people to use. °PRECAUTIONS  °Ice is not a safe treatment option for people with: °· Raynaud phenomenon. This is a condition affecting small blood vessels in the extremities. Exposure to cold may cause your problems to return. °· Cold hypersensitivity. There are many forms of cold hypersensitivity, including: °· Cold urticaria. Red, itchy hives appear on the skin when the tissues begin to warm after being iced. °· Cold erythema. This is a red, itchy rash caused by exposure to cold. °· Cold hemoglobinuria. Red blood cells break down when the tissues begin to warm after being iced. The hemoglobin that carry oxygen are passed into the urine because they cannot combine with blood proteins fast enough. °· Numbness or altered sensitivity in the area being iced. °If you have any of the following conditions, do not use ice until you have discussed cryotherapy with your caregiver: °· Heart conditions, such as arrhythmia, angina, or chronic heart disease. °· High blood pressure. °· Healing wounds or open skin in the area being iced. °· Current infections. °· Rheumatoid arthritis. °· Poor circulation. °· Diabetes. °Ice slows the blood flow in the region it is applied. This is beneficial when trying to stop inflamed tissues from spreading irritating chemicals to surrounding tissues. However, if you expose your skin to cold temperatures for too long or without the proper protection, you can damage your skin or nerves. Watch for signs of skin damage due to cold. °HOME CARE INSTRUCTIONS °Follow  these tips to use ice and cold packs safely. °· Place a dry or damp towel between the ice and skin. A damp towel will cool the skin more quickly, so you may need to shorten the time that the ice is used. °· For a more rapid response, add gentle compression to the ice. °· Ice for no more than 10 to 20 minutes at a time. The bonier the area you are icing, the less time it will take to get the benefits of ice. °· Check your skin after 5 minutes to make sure there are no signs of a poor response to cold or skin damage. °· Rest 20 minutes or more between uses. °· Once your skin is numb, you can end your treatment. You can test numbness by very lightly touching your skin. The touch should be so light that you do not see the skin dimple from the pressure of your fingertip. When using ice, most people will feel these normal sensations in this order: cold, burning, aching, and numbness. °· Do not use ice on someone who cannot communicate their responses to pain, such as small children or people with dementia. °HOW TO MAKE AN ICE PACK °Ice packs are the most common way to use ice therapy. Other methods include ice massage, ice baths, and cryosprays. Muscle creams that cause a cold, tingly feeling do not offer the same benefits that ice offers and should not be used as a substitute unless recommended by your caregiver. °To make an ice pack, do one of the following: °· Place crushed ice or a   bag of frozen vegetables in a sealable plastic bag. Squeeze out the excess air. Place this bag inside another plastic bag. Slide the bag into a pillowcase or place a damp towel between your skin and the bag. °· Mix 3 parts water with 1 part rubbing alcohol. Freeze the mixture in a sealable plastic bag. When you remove the mixture from the freezer, it will be slushy. Squeeze out the excess air. Place this bag inside another plastic bag. Slide the bag into a pillowcase or place a damp towel between your skin and the bag. °SEEK MEDICAL CARE  IF: °· You develop white spots on your skin. This may give the skin a blotchy (mottled) appearance. °· Your skin turns blue or pale. °· Your skin becomes waxy or hard. °· Your swelling gets worse. °MAKE SURE YOU:  °· Understand these instructions. °· Will watch your condition. °· Will get help right away if you are not doing well or get worse. °  °This information is not intended to replace advice given to you by your health care provider. Make sure you discuss any questions you have with your health care provider. °  °Document Released: 07/13/2011 Document Revised: 12/07/2014 Document Reviewed: 07/13/2011 °Elsevier Interactive Patient Education ©2016 [employer] °Wrist Sprain °A wrist sprain is a stretch or tear in the strong, fibrous tissues (ligaments) that connect your wrist bones. The ligaments of your wrist may be easily sprained. There are three types of wrist sprains. °· Grade 1. The ligament is not stretched or torn, but the sprain causes pain. °· Grade 2. The ligament is stretched or partially torn. You may be able to move your wrist, but not very much. °· Grade 3. The ligament or muscle completely tears. You may find it difficult or extremely painful to move your wrist even a little. °CAUSES °Often, wrist sprains are a result of a fall or an injury. The force of the impact causes the fibers of your ligament to stretch too much or tear. Common causes of wrist sprains include: °· Overextending your wrist while catching a ball with your hands. °· Repetitive or strenuous extension or bending of your wrist. °· Landing on your hand during a fall. °RISK FACTORS °· Having previous wrist injuries. °· Playing contact sports, such as boxing or wrestling. °· Participating in activities in which falling is common. °· Having poor wrist strength and flexibility. °SIGNS AND SYMPTOMS °· Wrist pain. °· Wrist tenderness. °· Inflammation or bruising of the wrist area. °· Hearing a "pop" or feeling a tear at the time of the  injury. °· Decreased wrist movement due to pain, stiffness, or weakness. °DIAGNOSIS °Your health care provider will examine your wrist. In some cases, an X-ray will be taken to make sure you did not break any bones. If your health care provider thinks that you tore a ligament, he or she may order an MRI of your wrist. °TREATMENT °Treatment involves resting and icing your wrist. You may also need to take pain medicines to help lessen pain and inflammation. Your health care provider may recommend keeping your wrist still (immobilized) with a splint to help your sprain heal. When the splint is no longer necessary, you may need to perform strengthening and stretching exercises. These exercises help you to regain strength and full range of motion in your wrist. Surgery is not usually needed for wrist sprains unless the ligament completely tears. °HOME CARE INSTRUCTIONS °· Rest your wrist. Do not do things that cause pain. °· Wear   your wrist splint as directed by your health care provider. °· Take medicines only as directed by your health care provider. °· To ease pain and swelling, apply ice to the injured area. °¨ Put ice in a plastic bag. °¨ Place a towel between your skin and the bag. °¨ Leave the ice on for 20 minutes, 2-3 times a day. °SEEK MEDICAL CARE IF: °· Your pain, discomfort, or swelling gets worse even with treatment. °· You feel sudden numbness in your hand. °  °This information is not intended to replace advice given to you by your health care provider. Make sure you discuss any questions you have with your health care provider. °  °Document Released: 07/20/2014 Document Reviewed: 07/20/2014 °Elsevier Interactive Patient Education ©2016 Elsevier Inc. ° °

## 2016-05-19 NOTE — ED Notes (Signed)
Discharge instructions reviewed with patient. Questions fielded by this RN. Patient verbalizes understanding of instructions. Patient discharged home in stable condition per Beers PA. No acute distress noted at time of discharge.   

## 2016-05-19 NOTE — ED Notes (Signed)
Pt arrived to the ED accompanied by her husband for a left wrist injury secondary to a mechanical fall. Pt reports that she lost her balance due to her neuropathy and fell. Pt denies LOC. Pt is AOx4 in no apparent distress.

## 2016-05-19 NOTE — ED Provider Notes (Signed)
[facility] Emergency Department Provider Note  ____________________________________________  Time seen: Approximately 7:40 PM  I have reviewed the triage vital signs and the nursing notes.   HISTORY  Chief Complaint Wrist Injury    HPI Pamela [name] is a 82 y.o. female presents for evaluation of left wrist injury secondary to falling prior to arrival. Patient states that she just lost her balance secondary to neuropathy and landing on an outstretched arm. Past medical history significant as noted below. Pain is about a 7/10 nonradiating.   Past Medical History  Diagnosis Date  . Diabetes mellitus   . Hypertension   . Dysrhythmia   . High cholesterol   . A-fib (HCC) 08/28/2014    Overview:  Overview:  Diastolic [facility] E[facility]/2012-[facility]   . Abnormal gait 02/08/2012  . Abscess or cellulitis of leg 04/18/2012  . Absence of bladder continence 07/29/2012  . Acid reflux 02/08/2012  . Acute kidney failure (HCC) 05/15/2012  . Afib, paroxysmal, with RVR at times with BBB 05/13/2012  . Allergic drug rash 05/15/2012  . Anemia 05/13/2012  . 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 Benign essential HTN 02/08/2012  . Bladder infection, chronic 06/29/2013  . BP (high blood pressure) 05/12/2012  . Bursitis of knee 08/07/2014  . Cellulitis in diabetic foot (HCC) 05/12/2012  . Chronic kidney disease 06/01/2012    Overview:  Overview:   12/2012- Renal US, simple L cyst. UIEP normal   . Clinical depression 08/28/2014  . CN (constipation) 03/17/2012  . Dermatitis due to drug reaction 05/15/2012  . Diastolic dysfunction, left ventricle 05/14/2012  . DM (diabetes mellitus) (HCC) 05/12/2012  . Edema leg 07/02/2014  . Enthesopathy of knee 08/07/2014  . Excess fluid volume 05/20/2012  . FOM (frequency of micturition) 07/29/2012  . Frank hematuria 09/27/2014  . HTN (hypertension) 05/12/2012  . Hypercholesterolemia 05/12/2012  . Incomplete bladder emptying 07/29/2012  . Open wound of  knee, leg (except thigh), and ankle 06/01/2012  . 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 Gangrene [facility](HCC)     Patient Active Problem List   Diagnosis Date Noted  . Urinary frequency 12/26/2015  . Incontinence 12/07/2015  . Cystocele 12/07/2015  . Tinea cruris 12/07/2015  . Recurrent UTI 08/11/2015  . Atrophic vaginitis 08/11/2015  . Fall 08/11/2015  . 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 Discoordination 05/08/2015  . Frank hematuria 09/27/2014  . A-fib (HCC) 08/28/2014  . Clinical depression 08/28/2014  . Atrial fibrillation (HCC) 08/28/2014  . Major depressive disorder with single episode (HCC) 08/28/2014  . Bursitis of knee 08/07/2014  . Enthesopathy of knee 08/07/2014  . Edema leg 07/02/2014  . Bladder infection, chronic 06/29/2013  . Incomplete bladder emptying 07/29/2012  . FOM (frequency of micturition) 07/29/2012  . Absence of bladder continence 07/29/2012  . Chronic kidney disease 06/01/2012  . Open wound of knee, leg (except thigh), and ankle 06/01/2012  . Skin rash 05/20/2012  . Fluid overload 05/20/2012  . Excess fluid volume 05/20/2012  . Acute kidney failure (HCC) 05/15/2012  . Allergic drug rash 05/15/2012  . 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  . Dermatitis due to drug taken internally 05/15/2012  . Dermatitis due to drug reaction 05/15/2012  . Acute renal failure (HCC) 05/15/2012  . Diastolic dysfunction, left ventricle 05/14/2012  . Afib, paroxysmal, with RVR at times with BBB 05/13/2012  . Anemia  05/13/2012  . Cellulitis in diabetic foot (HCC) 05/12/2012  . DM (diabetes mellitus) (HCC) 05/12/2012  . HTN (hypertension) 05/12/2012  . Hypercholesterolemia 05/12/2012  . BP (high blood pressure) 05/12/2012  . Abscess or cellulitis of leg 04/18/2012  . Diabetes mellitus (HCC) 04/18/2012  . CN (constipation)  03/17/2012  . Abnormal gait 02/08/2012  . Benign essential HTN 02/08/2012  . Acid reflux 02/08/2012    Past Surgical History  Procedure Laterality Date  . Toe amputation    . Tonsillectomy    . Abdominal hysterectomy    . Cataract surgery    . Endovenous ablation saphenous vein w/ laser      Current Outpatient Rx  Name  Route  Sig  Dispense  Refill  . amiodarone (PACERONE) 200 MG tablet      TAKE 1 TABLET (200 MG TOTAL) BY MOUTH ONCE DAILY.         Marland Kitchen amitriptyline (ELAVIL) 50 MG tablet      TAKE 1 TABLET (50 MG TOTAL) BY MOUTH NIGHTLY.      11   . atorvastatin (LIPITOR) 40 MG tablet   Oral   Take 40 mg by mouth daily.         Marland Kitchen EXPIRED: calcium carbonate (OS-CAL - DOSED IN MG OF ELEMENTAL CALCIUM) 1250 MG tablet   Oral   Take 1 tablet (500 mg of elemental calcium total) by mouth 3 (three) times daily.   90 tablet   1   . conjugated estrogens (PREMARIN) vaginal cream   Vaginal   Place 1 Applicatorful vaginally daily. Patient not taking: Reported on 02/26/2016   42.5 g   12   . glucose blood test strip      Reported on 12/18/2015         . insulin lispro protamine-lispro (HUMALOG MIX 75/25) (75-25) 100 UNIT/ML SUSP injection               . Insulin Syringe-Needle U-100 (LEADER INSULIN SYRINGE) 30G X 5/16" 0.5 ML MISC      Use as directed.         Marland Kitchen lisinopril (PRINIVIL,ZESTRIL) 20 MG tablet   Oral   Take 20 mg by mouth daily.         . magnesium 30 MG tablet   Oral   Take 30 mg by mouth 2 (two) times daily.         . metFORMIN (GLUCOPHAGE) 1000 MG tablet   Oral   Take 1,000 mg by mouth 2 (two) times daily.         Marland Kitchen EXPIRED: metoCLOPramide (REGLAN) 5 MG tablet   Oral   Take 1 tablet (5 mg total) by mouth 4 (four) times daily -  before meals and at bedtime.   120 tablet   1   . metoprolol (LOPRESSOR) 50 MG tablet   Oral   Take 1 tablet (50 mg total) by mouth 2 (two) times daily.   60 tablet   1   . NOVOLOG MIX 70/30 FLEXPEN  (70-30) 100 UNIT/ML FlexPen      INJECT 20 UNITS SUBQ EVERY MORNING AND 18 UNITS DAILY AT 5PM      2     Dispense as written.   . nystatin cream (MYCOSTATIN)   Topical   Apply 1 application topically 2 (two) times daily. Patient not taking: Reported on 03/04/2016   30 g   0   . pantoprazole (PROTONIX) 40 MG tablet   Oral   Take 40  mg by mouth daily.         Marland Kitchen PHARMACIST CHOICE LANCETS MISC      USE 2 TIMES DAILY FOR DIABETIC TESTING         . polyethylene glycol (MIRALAX / GLYCOLAX) packet   Oral   Take by mouth. Reported on 12/18/2015         . Probiotic Product (ALIGN) 4 MG CAPS   Oral   Take by mouth. Reported on 02/26/2016         . tamsulosin (FLOMAX) 0.4 MG CAPS capsule   Oral   Take by mouth.           Allergies Ciprofloxacin; Sulfamethoxazole-trimethoprim; Augmentin; Levofloxacin; Losartan potassium-hctz; Nitrofurantoin; Sulfa antibiotics; and Sulfasalazine  Family History  Problem Relation Age of Onset  . Kidney disease Neg Hx   . Prostate cancer Neg Hx   . Bladder Cancer Neg Hx   . Diabetes Mellitus II Mother   . Diabetes Paternal Grandmother   . Diabetes Maternal Grandmother   . Lung cancer Father   . Heart disease Mother     Social History Social History  Substance Use Topics  . Smoking status: Never Smoker   . Smokeless tobacco: None  . Alcohol Use: No    Review of Systems Constitutional: No fever/chills Cardiovascular: Denies chest pain. Respiratory: Denies shortness of breath. Musculoskeletal: Positive for left wrist pain. Skin: Negative for rash. Neurological: Negative for headaches, focal weakness or numbness.  10-point ROS otherwise negative.  ____________________________________________   PHYSICAL EXAM:  VITAL SIGNS: ED Triage Vitals  Enc Vitals Group     BP 05/19/16 1925 162/59 mmHg     Pulse Rate 05/19/16 1925 63     Resp 05/19/16 1925 20     Temp 05/19/16 1925 98.3 F (36.8 C)     Temp Source 05/19/16 1925  Oral     SpO2 05/19/16 1925 98 %     Weight 05/19/16 1925 165 lb (74.844 kg)     Height 05/19/16 1925  (1.651 m)     Head Cir --      Peak Flow --      Pain Score 05/19/16 1932 7     Pain Loc --      Pain Edu? --      Excl. in GC? --     Constitutional: Alert and oriented. Well appearing and in no acute distress. Head: Atraumatic. Nose: No congestion/rhinnorhea. Mouth/Throat: Mucous membranes are moist.  Oropharynx non-erythematous. Neck: No stridor.   Cardiovascular: Normal rate, regular rhythm. Grossly normal heart sounds.  Good peripheral circulation. Respiratory: Normal respiratory effort.  No retractions. Lungs CTAB. Musculoskeletal: Left wrist with full range of motion positive ecchymosis and bruising distally neurovascularly intact,  Neurologic:  Normal speech and language. No gross focal neurologic deficits are appreciated. No gait instability. Skin:  Skin is warm, dry and intact. No rash noted. Psychiatric: Mood and affect are normal. Speech and behavior are normal.  ____________________________________________   LABS (all labs ordered are listed, but only abnormal results are displayed)  Labs Reviewed - No data to display ____________________________________________  EKG   ____________________________________________  RADIOLOGY  FINDINGS: Four views of the left wrist submitted. No acute fracture or subluxation. There is old fracture deformity distal aspect of fifth metacarpal. Mild degenerative narrowing radiocarpal joint. Mild degenerative changes first carpometacarpal joint.  IMPRESSION: No acute fracture or subluxation. Mild degenerative changes. ____________________________________________   PROCEDURES  Procedure(s) performed: None  Critical Care performed: No  ____________________________________________  INITIAL IMPRESSION / ASSESSMENT AND PLAN / ED COURSE  Pertinent labs & imaging results that were available during my care of the  patient were reviewed by me and considered in my medical decision making (see chart for details).  Status post fall with acute left wrist contusion. Patient provided with a cockup wrist splint to wear as needed for comfort. Rx given encouraged Tylenol over-the-counter as needed for pain control. ____________________________________________   FINAL CLINICAL IMPRESSION(S) / ED DIAGNOSES  Final diagnoses:  Fall, initial encounter  Wrist sprain, left, initial encounter     This chart was dictated using voice recognition software/Dragon. Despite best efforts to proofread, errors can occur which can change the meaning. Any change was purely unintentional.   Evangeline Dakin, PA-C 05/19/16 2005  Rockne Menghini, MD 05/19/16 2148

## 2016-05-28 ENCOUNTER — Ambulatory Visit: Payer: Medicare Other | Admitting: Urology

## 2016-06-18 ENCOUNTER — Ambulatory Visit: Payer: Medicare Other | Admitting: Urology

## 2016-06-22 ENCOUNTER — Emergency Department
Admission: EM | Admit: 2016-06-22 | Discharge: 2016-06-23 | Disposition: A | Discharge status: Home / Self Care | Payer: Medicare Other | Attending: Emergency Medicine | Admitting: Emergency Medicine

## 2016-06-22 ENCOUNTER — Encounter: Payer: Self-pay | Admitting: Emergency Medicine

## 2016-06-22 DIAGNOSIS — I129 Hypertensive chronic kidney disease with stage 1 through stage 4 chronic kidney disease, or unspecified chronic kidney disease: Secondary | ICD-10-CM | POA: Insufficient documentation

## 2016-06-22 DIAGNOSIS — E1122 Type 2 diabetes mellitus with diabetic chronic kidney disease: Secondary | ICD-10-CM | POA: Insufficient documentation

## 2016-06-22 DIAGNOSIS — N3091 Cystitis, unspecified with hematuria: Secondary | ICD-10-CM | POA: Insufficient documentation

## 2016-06-22 DIAGNOSIS — R1030 Lower abdominal pain, unspecified: Secondary | ICD-10-CM

## 2016-06-22 DIAGNOSIS — N3001 Acute cystitis with hematuria: Secondary | ICD-10-CM

## 2016-06-22 DIAGNOSIS — N189 Chronic kidney disease, unspecified: Secondary | ICD-10-CM | POA: Insufficient documentation

## 2016-06-22 DIAGNOSIS — N390 Urinary tract infection, site not specified: Secondary | ICD-10-CM | POA: Diagnosis present

## 2016-06-22 DIAGNOSIS — Z794 Long term (current) use of insulin: Secondary | ICD-10-CM | POA: Insufficient documentation

## 2016-06-22 DIAGNOSIS — R319 Hematuria, unspecified: Secondary | ICD-10-CM

## 2016-06-22 LAB — URINALYSIS COMPLETE WITH MICROSCOPIC (ARMC ONLY)
Bilirubin Urine: NEGATIVE
Glucose, UA: NEGATIVE mg/dL
NITRITE: POSITIVE — AB
PROTEIN: 100 mg/dL — AB
SPECIFIC GRAVITY, URINE: 1.011 (ref 1.005–1.030)
Squamous Epithelial / LPF: NONE SEEN
pH: 7 (ref 5.0–8.0)

## 2016-06-22 LAB — CBC WITH DIFFERENTIAL/PLATELET
BASOS PCT: 1 %
Basophils Absolute: 0.1 10*3/uL (ref 0–0.1)
EOS ABS: 0 10*3/uL (ref 0–0.7)
Eosinophils Relative: 0 %
HCT: 31.1 % — ABNORMAL LOW (ref 35.0–47.0)
HEMOGLOBIN: 10.3 g/dL — AB (ref 12.0–16.0)
Lymphocytes Relative: 11 %
Lymphs Abs: 0.9 10*3/uL — ABNORMAL LOW (ref 1.0–3.6)
MCH: 31.9 pg (ref 26.0–34.0)
MCHC: 33.2 g/dL (ref 32.0–36.0)
MCV: 96.3 fL (ref 80.0–100.0)
MONOS PCT: 10 %
Monocytes Absolute: 0.8 10*3/uL (ref 0.2–0.9)
NEUTROS PCT: 78 %
Neutro Abs: 6.4 10*3/uL (ref 1.4–6.5)
PLATELETS: 246 10*3/uL (ref 150–440)
RBC: 3.23 MIL/uL — ABNORMAL LOW (ref 3.80–5.20)
RDW: 13.5 % (ref 11.5–14.5)
WBC: 8.3 10*3/uL (ref 3.6–11.0)

## 2016-06-22 LAB — COMPREHENSIVE METABOLIC PANEL
ALBUMIN: 2.5 g/dL — AB (ref 3.5–5.0)
ALK PHOS: 71 U/L (ref 38–126)
ALT: 20 U/L (ref 14–54)
ANION GAP: 10 (ref 5–15)
AST: 21 U/L (ref 15–41)
BUN: 31 mg/dL — ABNORMAL HIGH (ref 6–20)
CHLORIDE: 105 mmol/L (ref 101–111)
CO2: 23 mmol/L (ref 22–32)
Calcium: 8.7 mg/dL — ABNORMAL LOW (ref 8.9–10.3)
Creatinine, Ser: 1.45 mg/dL — ABNORMAL HIGH (ref 0.44–1.00)
GFR calc Af Amer: 38 mL/min — ABNORMAL LOW (ref >60)
GFR calc non Af Amer: 33 mL/min — ABNORMAL LOW (ref >60)
GLUCOSE: 127 mg/dL — AB (ref 65–99)
POTASSIUM: 4.5 mmol/L (ref 3.5–5.1)
SODIUM: 138 mmol/L (ref 135–145)
Total Bilirubin: 0.7 mg/dL (ref 0.3–1.2)
Total Protein: 5.7 g/dL — ABNORMAL LOW (ref 6.5–8.1)

## 2016-06-22 LAB — LIPASE, BLOOD: Lipase: 13 U/L (ref 11–51)

## 2016-06-22 MED ORDER — DEXTROSE 5 % IV SOLN
1.0000 g | Freq: Once | INTRAVENOUS | Status: AC
  Administered 2016-06-23: 1 g via INTRAVENOUS
  Filled 2016-06-22: qty 10

## 2016-06-22 MED ORDER — SODIUM CHLORIDE 0.9 % IV BOLUS (SEPSIS)
500.0000 mL | Freq: Once | INTRAVENOUS | Status: AC
  Administered 2016-06-22: 500 mL via INTRAVENOUS

## 2016-06-22 NOTE — ED Notes (Signed)
Patient's son to bedside.

## 2016-06-22 NOTE — ED Provider Notes (Signed)
[facility] Emergency Department Provider Note   ____________________________________________  Time seen: Approximately 9:10 PM  I have reviewed the triage vital signs and the nursing notes.   HISTORY  Chief Complaint Recurrent UTI and Fall  HPI Pamela [name] is a 81 y.o. female with a history of recurrent UTI who is presenting to the emergency department today with blood in her urine. She says that she also had a fall this afternoon where she bent down to pick something up and then fell onto her left wrist. However, says that she has frequent falls due to her neuropathy and that this is not an abnormal occurrence. She said that she initially fell on her left wrist but is not having any left wrist pain or problems moving her left wrist. She is not concerned about the fall but more concerned about the blood in her urine. She sees urology and was just diagnosed urinary tract infection on July 3 and prescribed a course of Keflex.Patient denies hitting her head or losing consciousness after the fall.   Past Medical History:  Diagnosis Date  . A-fib (HCC) 08/28/2014   Overview:  Overview:  Diastolic dysfunction on ECHO 04/2012-done at [facility]   . Abnormal gait 02/08/2012  . Abscess or cellulitis of leg 04/18/2012  . Absence of bladder continence 07/29/2012  . Acid reflux 02/08/2012  . Acute kidney failure (HCC) 05/15/2012  . Afib, paroxysmal, with RVR at times with BBB 05/13/2012  . Allergic drug rash 05/15/2012  . Anemia 05/13/2012  . 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 Benign essential HTN 02/08/2012  . Bladder infection, chronic 06/29/2013  . BP (high blood pressure) 05/12/2012  . Bursitis of knee 08/07/2014  . Cellulitis in diabetic foot (HCC) 05/12/2012  . Chronic kidney disease 06/01/2012   Overview:  Overview:   12/2012- Renal US, simple L cyst. UIEP normal   . Clinical depression 08/28/2014  . CN (constipation) 03/17/2012  . Dermatitis due to drug reaction 05/15/2012    . Diabetes mellitus   . Diastolic dysfunction, left ventricle 05/14/2012  . DM (diabetes mellitus) (HCC) 05/12/2012  . Dysrhythmia   . Edema leg 07/02/2014  . Enthesopathy of knee 08/07/2014  . Excess fluid volume 05/20/2012  . FOM (frequency of micturition) 07/29/2012  . Frank hematuria 09/27/2014  . Gangrene (HCC)   . High cholesterol   . HTN (hypertension) 05/12/2012  . Hypercholesterolemia 05/12/2012  . Hypertension   . Incomplete bladder emptying 07/29/2012  . Open wound of knee, leg (except thigh), and ankle 06/01/2012  . 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   Overview:  Overview:  Several years ago, allergic reaction to sulfa antibiotic Several years ago, allergic reaction to sulfa antibiotic     Patient Active Problem List   Diagnosis Date Noted  . Urinary frequency 12/26/2015  . Incontinence 12/07/2015  . Cystocele 12/07/2015  . Tinea cruris 12/07/2015  . Recurrent UTI 08/11/2015  . Atrophic vaginitis 08/11/2015  . Fall 08/11/2015  . 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 Discoordination 05/08/2015  . Frank hematuria 09/27/2014  . A-fib (HCC) 08/28/2014  . Clinical depression 08/28/2014  . Atrial fibrillation (HCC) 08/28/2014  . Major depressive disorder with single episode (HCC) 08/28/2014  . Bursitis of knee 08/07/2014  . Enthesopathy of knee 08/07/2014  . Edema leg 07/02/2014  . Bladder infection, chronic 06/29/2013  . Incomplete bladder emptying 07/29/2012  . FOM (frequency of micturition) 07/29/2012  . Absence of bladder continence 07/29/2012  . Chronic kidney disease 06/01/2012  . Open  wound of knee, leg (except thigh), and ankle 06/01/2012  . Skin rash 05/20/2012  . Fluid overload 05/20/2012  . Excess fluid volume 05/20/2012  . Acute kidney failure (HCC) 05/15/2012  . Allergic drug rash 05/15/2012  . 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  . Dermatitis due to drug taken internally 05/15/2012  . Dermatitis due to drug reaction 05/15/2012  . Acute renal  failure (HCC) 05/15/2012  . Diastolic dysfunction, left ventricle 05/14/2012  . Afib, paroxysmal, with RVR at times with BBB 05/13/2012  . Anemia 05/13/2012  . Cellulitis in diabetic foot (HCC) 05/12/2012  . DM (diabetes mellitus) (HCC) 05/12/2012  . HTN (hypertension) 05/12/2012  . Hypercholesterolemia 05/12/2012  . BP (high blood pressure) 05/12/2012  . Abscess or cellulitis of leg 04/18/2012  . Diabetes mellitus (HCC) 04/18/2012  . CN (constipation) 03/17/2012  . Abnormal gait 02/08/2012  . Benign essential HTN 02/08/2012  . Acid reflux 02/08/2012    Past Surgical History:  Procedure Laterality Date  . ABDOMINAL HYSTERECTOMY    . cataract surgery    . ENDOVENOUS ABLATION SAPHENOUS VEIN W/ LASER    . TOE AMPUTATION    . TONSILLECTOMY      Current Outpatient Rx  . Order #: [id number] Class: Historical Med  . Order #: [id number] Class: Historical Med  . Order #: [id number] Class: Historical Med  . Order #: [id number] Class: Print  . Order #: [id number] Class: Normal  . Order #: [id number] Class: Historical Med  . Order #: [id number] Class: Historical Med  . Order #: [id number] Class: Historical Med  . Order #: [id number] Class: Historical Med  . Order #: [id number] Class: Historical Med  . Order #: [id number] Class: Print  . Order #: [id number] Class: Print  . Order #: [id number] Class: Historical Med  . Order #: [id number] Class: Normal  . Order #: [id number] Class: Historical Med  . Order #: [id number] Class: Historical Med  . Order #: [id number] Class: Historical Med  . Order #: [id number] Class: Historical Med  . Order #: [id number] Class: Historical Med    Allergies Ciprofloxacin; Sulfamethoxazole-trimethoprim; Augmentin [amoxicillin-pot clavulanate]; Levofloxacin; Losartan potassium-hctz; Nitrofurantoin; Sulfa antibiotics; and Sulfasalazine  Family History  Problem Relation Age of Onset  . Diabetes Mellitus II Mother   . Heart disease Mother   . Lung cancer Father   . Diabetes Paternal Grandmother     . Diabetes Maternal Grandmother   . Kidney disease Neg Hx   . Prostate cancer Neg Hx   . Bladder Cancer Neg Hx     Social History Social History  Substance Use Topics  . Smoking status: Never Smoker  . Smokeless tobacco: Never Used  . Alcohol use No    Review of Systems Constitutional: No fever/chills Eyes: No visual changes. ENT: No sore throat. Cardiovascular: Denies chest pain. Respiratory: Denies shortness of breath. Gastrointestinal: No abdominal pain.  No nausea, no vomiting.  No diarrhea.  No constipation. Genitourinary: Negative for dysuria. Musculoskeletal: Negative for back pain. Skin: Negative for rash. Neurological: Negative for headaches, focal weakness or numbness.  10-point ROS otherwise negative.  ____________________________________________   PHYSICAL EXAM:  VITAL SIGNS: ED Triage Vitals  Enc Vitals Group     BP 06/22/16 2019 (!) 134/56     Pulse Rate 06/22/16 2019 88     Resp 06/22/16 2019 18     Temp 06/22/16 2019 100.3 F (37.9 C)     Temp Source 06/22/16 2019 Oral     SpO2 06/22/16 2019 96 %     Weight 06/22/16 2019 165 lb (74.8 kg)     Height 06/22/16 2019  (  1.651 m)     Head Circumference --      Peak Flow --      Pain Score 06/22/16 2020 7     Pain Loc --      Pain Edu? --      Excl. in GC? --     Constitutional: Alert and oriented. Well appearing and in no acute distress. Eyes: Conjunctivae are normal. PERRL. EOMI. Head: Atraumatic. Nose: No congestion/rhinnorhea. Mouth/Throat: Mucous membranes are moist.   Neck: No stridor.   Cardiovascular: Normal rate, regular rhythm. Grossly normal heart sounds.  Good peripheral circulation. Respiratory: Normal respiratory effort.  No retractions. Lungs CTAB. Gastrointestinal: Soft With mild lower abdominal tenderness to palpation. No distention.  No CVA tenderness. Musculoskeletal: No lower extremity tenderness nor edema.  No joint effusions. Neurologic:  Normal speech and language.  No gross focal neurologic deficits are appreciated.  Skin:  Skin is warm, dry and intact. No rash noted. Psychiatric: Mood and affect are normal. Speech and behavior are normal.  ____________________________________________   LABS (all labs ordered are listed, but only abnormal results are displayed)  Labs Reviewed  CBC WITH DIFFERENTIAL/PLATELET - Abnormal; Notable for the following:       Result Value   RBC 3.23 (*)    Hemoglobin 10.3 (*)    HCT 31.1 (*)    Lymphs Abs 0.9 (*)    All other components within normal limits  COMPREHENSIVE METABOLIC PANEL - Abnormal; Notable for the following:    Glucose, Bld 127 (*)    BUN 31 (*)    Creatinine, Ser 1.45 (*)    Calcium 8.7 (*)    Total Protein 5.7 (*)    Albumin 2.5 (*)    GFR calc non Af Amer 33 (*)    GFR calc Af Amer 38 (*)    All other components within normal limits  URINE CULTURE  LIPASE, BLOOD  URINALYSIS COMPLETEWITH MICROSCOPIC ([facility] ONLY)  URINALYSIS COMPLETEWITH MICROSCOPIC ([facility] ONLY)   ____________________________________________  EKG  ED ECG REPORT I, Arelia Longest, the attending physician, personally viewed and interpreted this ECG.   Date: 06/22/2016  EKG Time: 2025  Rate: 96  Rhythm: normal sinus rhythm  Axis: Normal  Intervals:left bundle branch block which is seen on old EKGs.  ST&T Change: Does not meet criteria for Sgarbossa.   ____________________________________________  RADIOLOGY   ____________________________________________   PROCEDURES   Procedures   ____________________________________________   INITIAL IMPRESSION / ASSESSMENT AND PLAN / ED COURSE  Pertinent labs & imaging results that were available during my care of the patient were reviewed by me and considered in my medical decision making (see chart for details).  Patient says that she has had previous abdominal pain which is similar with other UTIs.  Clinical Course    ----------------------------------------- 11:26 PM on 06/22/2016 -----------------------------------------  Only minimal amount of urine able to be collected and not enough for urinalysis. Urine was then re-collected. Signed out to Dr. Zenda Alpers for follow-up the urinalysis. Likely imaging of urine is negative for infection.  ____________________________________________   FINAL CLINICAL IMPRESSION(S) / ED DIAGNOSES  Hematuria. Lower abdominal pain.    NEW MEDICATIONS STARTED DURING THIS VISIT:  New Prescriptions   No medications on file     Note:  This document was prepared using Dragon voice recognition software and may include unintentional dictation errors.    Myrna Blazer, MD 06/22/16 [phone]

## 2016-06-22 NOTE — ED Notes (Signed)
Given verbal order (and VORB given) by MD for in and out cath and bladder scan.  This RN will enter orders.

## 2016-06-22 NOTE — ED Provider Notes (Signed)
-----------------------------------------   11:55 PM on 06/22/2016 -----------------------------------------   Blood pressure (!) 158/60, pulse 86, temperature 99.9 F (37.7 C), temperature source Oral, resp. rate 18, height 5\' 5"  (1.651 m), weight 165 lb (74.8 kg), SpO2 96 %.  Assuming care from Dr. Pershing Proud.  In short, Pamela [name] is a 82 y.o. female with a chief complaint of Recurrent UTI and Fall .  Refer to the original H&P for additional details.  The current plan of care is to follow up the results of the urinalysis.  Urinalysis    Component Value Date/Time   COLORURINE YELLOW (A) 06/22/2016 2258   APPEARANCEUR TURBID (A) 06/22/2016 2258   APPEARANCEUR Turbid (A) 05/05/2016 1418   LABSPEC 1.011 06/22/2016 2258   LABSPEC 1.010 07/16/2012 0655   PHURINE 7.0 06/22/2016 2258   GLUCOSEU NEGATIVE 06/22/2016 2258   GLUCOSEU 50 mg/dL 00/86/7619 5093   HGBUR 2+ (A) 06/22/2016 2258   BILIRUBINUR NEGATIVE 06/22/2016 2258   BILIRUBINUR Negative 05/05/2016 1418   BILIRUBINUR Negative 07/16/2012 0655   KETONESUR TRACE (A) 06/22/2016 2258   PROTEINUR 100 (A) 06/22/2016 2258   UROBILINOGEN 0.2 05/15/2012 0952   NITRITE POSITIVE (A) 06/22/2016 2258   LEUKOCYTESUR 2+ (A) 06/22/2016 2258   LEUKOCYTESUR 1+ (A) 05/05/2016 1418   LEUKOCYTESUR Trace 07/16/2012 0655    I will give the patient a dose of ceftriaxone and reassess the patient. If she continues to feel well and appear I will discharge her to home and have her follow-up with her primary care physician.  The patient is sitting up on the bed and she is eating. The patient feels cold but otherwise feels well. She will follow-up with her primary care physician. The patient nor her significant other have any other questions at this time.     Rebecka Apley, MD 06/23/16 [phone]

## 2016-06-23 MED ORDER — CEFUROXIME AXETIL 500 MG PO TABS: 500.0000 mg | ORAL_TABLET | Freq: Two times a day (BID) | ORAL | 0 refills | Status: AC

## 2016-06-23 NOTE — ED Notes (Signed)
MD at bedside. 

## 2016-06-23 NOTE — ED Notes (Signed)
Patient given a box lunch and milk.

## 2016-06-24 LAB — URINE CULTURE

## 2016-07-02 ENCOUNTER — Ambulatory Visit (INDEPENDENT_AMBULATORY_CARE_PROVIDER_SITE_OTHER): Payer: Medicare Other | Admitting: Urology

## 2016-07-02 ENCOUNTER — Encounter: Payer: Self-pay | Admitting: Urology

## 2016-07-02 VITALS — BP 165/68 | HR 63 | Ht 65.0 in | Wt 163.5 lb

## 2016-07-02 DIAGNOSIS — R32 Unspecified urinary incontinence: Secondary | ICD-10-CM | POA: Diagnosis not present

## 2016-07-02 LAB — PTNS-PERCUTANEOUS TIBIAL NERVE STIMULATION: Scan Result: 9

## 2016-07-03 NOTE — Progress Notes (Signed)
Chief Complaint:  Chief Complaint  Patient presents with  . Urinary Incontinence    PTNS     HPI: Patient is an 82 year old Caucasian female with a history of recurrent UTI's and urinary incontinence who presents today for a maintenance PTNS. Her symptoms began several years ago.   The incontinence is not related to a specific event.  She experiences large leaks.  The leakage occurs day and night.  She experiences leakage with coughing, sneezing, or laughing.  She is experiencing leakage 5 times daily.  She is experiencing daytime frequency of 10 times daily.  She is experiencing nighttime frequency of 3 times nightly.  She is not having pain or burning with urination.   She is having a mild urgency.  She uses heavy pads for protection.  She is wearing two to three pads daily.  She is consuming one cup of coffee daily.    After her last treatment, she is experiencing 20 daytime voids (stable), 2 night time voids (stable) and 12 incontinence episodes daily (stable).        Previous Therapy: She has not tried any previous therapies.  She is not a candidate for anticholinergics due to her memory loss and age.  She is not a candidate for Myrbetriq due to her uncontrolled hypertension.  BP (!) 165/68   Pulse 63   Ht 5\' 5"  (1.651 m)   Wt 163 lb 8 oz (74.2 kg)   BMI 27.21 kg/m   She does not have contraindications present for PTNS, such as:       Pacemaker - NO      Implantable defibrillator - NO      History of abnormal bleeding - NO      History of neuropathies or nerve damage - NO  Discussed with patient possible complications of procedure, such as discomfort, bleeding at insertion/stimulation site, procedure consent signed  Patient goals: Her goals for therapy are a reduction in incontinence.  PTNS treatment: The needle electrode was inserted into the lower inner aspect of the patient's light leg. The surface electrode was placed on the inside arch of the foot on the treatment leg.  The lead set was connected to the stimulator and the needle electrode clip was connected to the needle electrode. The stimulator that produces an adjustable electrical pulse that travels to the sacral nerve plexus via the tibial nerve was increased to 9 and until the patient noted a sensory response.  Treatment Plan:   Patient tolerated the PTNS therapy for 30 minutes. The electrode was removed without difficulty to the patient from the left leg. She will return in 1 month for her maintenance  PTNS

## 2016-08-04 ENCOUNTER — Ambulatory Visit: Payer: Medicare Other | Admitting: Urology

## 2016-08-14 ENCOUNTER — Encounter (INDEPENDENT_AMBULATORY_CARE_PROVIDER_SITE_OTHER): Payer: Self-pay

## 2016-09-11 ENCOUNTER — Ambulatory Visit (INDEPENDENT_AMBULATORY_CARE_PROVIDER_SITE_OTHER): Payer: Medicare Other | Admitting: Vascular Surgery

## 2016-09-21 ENCOUNTER — Encounter: Payer: Self-pay | Admitting: Urology

## 2016-09-21 ENCOUNTER — Ambulatory Visit (INDEPENDENT_AMBULATORY_CARE_PROVIDER_SITE_OTHER): Payer: Medicare Other | Admitting: Urology

## 2016-09-21 VITALS — BP 146/70 | HR 65 | Ht 65.0 in | Wt 158.7 lb

## 2016-09-21 DIAGNOSIS — N281 Cyst of kidney, acquired: Secondary | ICD-10-CM

## 2016-09-21 DIAGNOSIS — N390 Urinary tract infection, site not specified: Secondary | ICD-10-CM | POA: Diagnosis not present

## 2016-09-21 DIAGNOSIS — N3946 Mixed incontinence: Secondary | ICD-10-CM

## 2016-09-21 DIAGNOSIS — N3289 Other specified disorders of bladder: Secondary | ICD-10-CM | POA: Diagnosis not present

## 2016-09-21 LAB — URINALYSIS, COMPLETE
BILIRUBIN UA: NEGATIVE
Glucose, UA: NEGATIVE
Nitrite, UA: POSITIVE — AB
PH UA: 7 (ref 5.0–7.5)
Specific Gravity, UA: 1.02 (ref 1.005–1.030)
Urobilinogen, Ur: 0.2 mg/dL (ref 0.2–1.0)

## 2016-09-21 LAB — MICROSCOPIC EXAMINATION
EPITHELIAL CELLS (NON RENAL): NONE SEEN /HPF (ref 0–10)
WBC, UA: >30 /hpf — AB (ref 0–?)

## 2016-09-21 NOTE — Patient Instructions (Signed)

## 2016-09-21 NOTE — Progress Notes (Signed)
09/21/2016 11:10 AM   Pamela [name] [birth year] [id number]  Referring provider: Mick Sell, MD [address] [facility] [address] [facility] - INFECTIOUS DISEASE [place], [place] [address]  Chief Complaint  Patient presents with  . Follow-up    Incontinence and new referral from [facility] for bladder wall thickening    HPI: Patient is an 82 year old Caucasian female who is referred to [place] by Dr. Sampson Goon for bladder thickening and incontinence.    Bladder wall thickening A CT of the pelvis was performed on 04/14/2012 and it noted no hydronephrosis, but irregular thickened bladder wall with debris within the bladder and left renal cysts.  Patient underwent a cystoscopy on 09/27/2014 with Dr. Lonna Cobb and no abnormalities were noted.    Recurrent UTI Patient states that she has had at least five urinary tract infections over the last year.  Her symptoms with a urinary tract infection consist of urgency, hesitancy and dysuria.  She denies gross hematuria, suprapubic pain, back pain, abdominal pain or flank pain.  She has not had any recent fevers, chills, nausea or vomiting.   She states she had a sling procedure with at [facility].  She does not have a history of nephrolithiasis or GU trauma.  Reviewing her records,  she has had 3 documented UTI's.   She had two positive for Proteus mirabilis and Klebsiella.  She is not sexually active.  She is post menopausal.  She is applying vaginal estrogen cream.  She admits to constipation.  She does engage in good perineal hygiene. She does not take tub baths.  She has incontinence.  She is using incontinence pads.  She is going through 7 to 8 pads daily.   She is not having pain with bladder filling.  She has not had any recent imaging studies.  She is drinking two to three 8oz bottles of water daily.  She is also taking Vitamin C, cranberry juice and probiotics.  CATH UA today noted nitrite positive and > 30 WBC's.  She is currently on  Keflex.    Incontinence Patient was not a candidate for anticholinergics and she was not a candidate for Myrbetriq due to her HTN.  She did go through a series of 12 weekly PTNS, but she has missed several maintenance PTNS.  Her baseline urinary symptoms consist of frequency, urgency, nocturia, straining to urinate, hesitancy and a weak urinary stream.     PMH: Past Medical History:  Diagnosis Date  . A-fib (HCC) 08/28/2014   Overview:  Overview:  Diastolic dysfunction on ECHO 04/2012-done at [facility]   . Abnormal gait 02/08/2012  . Abscess or cellulitis of leg 04/18/2012  . Absence of bladder continence 07/29/2012  . Acid reflux 02/08/2012  . Acute kidney failure (HCC) 05/15/2012  . Afib, paroxysmal, with RVR at times with BBB 05/13/2012  . Allergic drug rash 05/15/2012  . Anemia 05/13/2012  . 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 Benign essential HTN 02/08/2012  . Bladder infection, chronic 06/29/2013  . BP (high blood pressure) 05/12/2012  . Bursitis of knee 08/07/2014  . Cellulitis in diabetic foot (HCC) 05/12/2012  . Chronic kidney disease 06/01/2012   Overview:  Overview:   12/2012- Renal US, simple L cyst. UIEP normal   . Clinical depression 08/28/2014  . CN (constipation) 03/17/2012  . Dermatitis due to drug reaction 05/15/2012  . Diabetes mellitus   . Diastolic dysfunction, left ventricle 05/14/2012  . DM (diabetes mellitus) (HCC) 05/12/2012  . Dysrhythmia   .  Edema leg 07/02/2014  . Enthesopathy of knee 08/07/2014  . Excess fluid volume 05/20/2012  . FOM (frequency of micturition) 07/29/2012  . Frank hematuria 09/27/2014  . Gangrene (HCC)   . High cholesterol   . HTN (hypertension) 05/12/2012  . Hypercholesterolemia 05/12/2012  . Hypertension   . Incomplete bladder emptying 07/29/2012  . Open wound of knee, leg (except thigh), and ankle 06/01/2012  . 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   Overview:  Overview:  Several years ago, allergic reaction to sulfa antibiotic Several years ago, allergic  reaction to sulfa antibiotic     Surgical History: Past Surgical History:  Procedure Laterality Date  . ABDOMINAL HYSTERECTOMY    . cataract surgery    . ENDOVENOUS ABLATION SAPHENOUS VEIN W/ LASER    . TOE AMPUTATION    . TONSILLECTOMY      Home Medications:    Medication List       Accurate as of 09/21/16 11:10 AM. Always use your most recent med list.          ALIGN 4 MG Caps Take by mouth. Reported on 02/26/2016   amiodarone 200 MG tablet Commonly known as:  PACERONE TAKE 1 TABLET (200 MG TOTAL) BY MOUTH ONCE DAILY.   amitriptyline 50 MG tablet Commonly known as:  ELAVIL TAKE 1 TABLET (50 MG TOTAL) BY MOUTH NIGHTLY.   atorvastatin 40 MG tablet Commonly known as:  LIPITOR Take 40 mg by mouth daily.   calcium carbonate 1250 (500 Ca) MG tablet Commonly known as:  OS-CAL - dosed in mg of elemental calcium Take 1 tablet (500 mg of elemental calcium total) by mouth 3 (three) times daily.   cephALEXin 500 MG capsule Commonly known as:  KEFLEX Take by mouth.   estradiol 0.1 MG/GM vaginal cream Commonly known as:  ESTRACE Place 1/2 Gram (pea size amount) daily at urethral opening daily for 2 weeks, then use 3 times per week.   glucose blood test strip Reported on 12/18/2015   HUMALOG MIX 75/25 (75-25) 100 UNIT/ML Susp injection Generic drug:  insulin lispro protamine-lispro   lisinopril 20 MG tablet Commonly known as:  PRINIVIL,ZESTRIL Take 20 mg by mouth daily.   magnesium 30 MG tablet Take 30 mg by mouth 2 (two) times daily.   metFORMIN 1000 MG tablet Commonly known as:  GLUCOPHAGE Take 1,000 mg by mouth 2 (two) times daily.   metoCLOPramide 5 MG tablet Commonly known as:  REGLAN Take 1 tablet (5 mg total) by mouth 4 (four) times daily -  before meals and at bedtime.   metoprolol 50 MG tablet Commonly known as:  LOPRESSOR Take 1 tablet (50 mg total) by mouth 2 (two) times daily.   NOVOLOG MIX 70/30 FLEXPEN (70-30) 100 UNIT/ML FlexPen Generic  drug:  insulin aspart protamine - aspart INJECT 20 UNITS SUBQ EVERY MORNING AND 18 UNITS DAILY AT 5PM   nystatin cream Commonly known as:  MYCOSTATIN Apply 1 application topically 2 (two) times daily.   pantoprazole 40 MG tablet Commonly known as:  PROTONIX Take 40 mg by mouth daily.   PHARMACIST CHOICE LANCETS Misc USE 2 TIMES DAILY FOR DIABETIC TESTING   polyethylene glycol packet Commonly known as:  MIRALAX / GLYCOLAX Take by mouth. Reported on 12/18/2015   tamsulosin 0.4 MG Caps capsule Commonly known as:  FLOMAX Take by mouth.       Allergies:  Allergies  Allergen Reactions  . Ciprofloxacin Anaphylaxis  . Sulfamethoxazole-Trimethoprim Other (See Comments) and Rash    Micah Flesher  into [employer] [place].  . Augmentin [Amoxicillin-Pot Clavulanate] Hives  . Levofloxacin Other (See Comments)  . Losartan Potassium-Hctz Other (See Comments)    Other reaction(s): Unknown  . Nitrofurantoin Other (See Comments)  . Sulfa Antibiotics Other (See Comments)    Levonne Spiller Steven's Johnsons  . Sulfasalazine     unknown  . Levaquin [Levofloxacin In D5w]     Family History: Family History  Problem Relation Age of Onset  . Diabetes Mellitus II Mother   . Heart disease Mother   . Lung cancer Father   . Diabetes Paternal Grandmother   . Diabetes Maternal Grandmother   . Kidney disease Neg Hx   . Prostate cancer Neg Hx   . Bladder Cancer Neg Hx     Social History:  reports that she has never smoked. She has never used smokeless tobacco. She reports that she does not drink alcohol or use drugs.  ROS: UROLOGY Frequent Urination?: Yes Hard to postpone urination?: Yes Burning/pain with urination?: Yes Get up at night to urinate?: Yes Leakage of urine?: Yes Urine stream starts and stops?: No Trouble starting stream?: Yes Do you have to strain to urinate?: Yes Blood in urine?: No Urinary tract infection?: Yes Sexually transmitted disease?: No Injury to kidneys or  bladder?: No Painful intercourse?: No Weak stream?: Yes Currently pregnant?: No Vaginal bleeding?: No Last menstrual period?: n  Gastrointestinal Nausea?: No Vomiting?: No Indigestion/heartburn?: Yes Diarrhea?: No Constipation?: Yes  Constitutional Fever: No Night sweats?: No Weight loss?: No Fatigue?: Yes  Skin Skin rash/lesions?: No Itching?: No  Eyes Blurred vision?: No Double vision?: No  Ears/Nose/Throat Sore throat?: No Sinus problems?: No  Hematologic/Lymphatic Swollen glands?: No Easy bruising?: Yes  Cardiovascular Leg swelling?: No Chest pain?: No  Respiratory Cough?: No Shortness of breath?: No  Endocrine Excessive thirst?: Yes  Musculoskeletal Back pain?: No Joint pain?: No  Neurological Headaches?: No Dizziness?: No  Psychologic Depression?: Yes Anxiety?: No  Physical Exam: BP (!) 146/70   Pulse 65   Ht 5\' 5"  (1.651 m)   Wt 158 lb 11.2 oz (72 kg)   BMI 26.41 kg/m   Constitutional: Well nourished. Alert and oriented, No acute distress. HEENT: [place] AT, moist mucus membranes. Trachea midline, no masses. Cardiovascular: No clubbing, cyanosis, or edema. Respiratory: Normal respiratory effort, no increased work of breathing. GI: Abdomen is soft, non tender, non distended, no abdominal masses. Liver and spleen not palpable.  No hernias appreciated.  Stool sample for occult testing is not indicated.   GU: No CVA tenderness.  No bladder fullness or masses.   Skin: No rashes, bruises or suspicious lesions. Lymph: No cervical or inguinal adenopathy. Neurologic: Grossly intact, no focal deficits, moving all 4 extremities. Psychiatric: Normal mood and affect.  Laboratory Data: Lab Results  Component Value Date   WBC 8.3 06/22/2016   HGB 10.3 (L) 06/22/2016   HCT 31.1 (L) 06/22/2016   MCV 96.3 06/22/2016   PLT 246 06/22/2016    Lab Results  Component Value Date   CREATININE 1.45 (H) 06/22/2016    Lab Results  Component Value  Date   HGBA1C 7.6 (H) 05/13/2012    Lab Results  Component Value Date   TSH 2.546 05/19/2012       Component Value Date/Time   CHOL 119 07/08/2013 0739   HDL 58 07/08/2013 0739   VLDL 22 07/08/2013 0739   LDLCALC 39 07/08/2013 0739    Lab Results  Component Value Date   AST 21 06/22/2016  Lab Results  Component Value Date   ALT 20 06/22/2016     Urinalysis Nitrite positive and > 30 WBC's.  See EPIC.    Assessment & Plan:    1. Bladder wall irregularity  - seen on CT in 2013  - cystoscopy performed in 2015 showed no abnormality  - no gross hematuria  - if abnormality is seen in bladder on RUS, will consider cystoscopy in the future   2. Recurrent UTI's  - CATH UA today demonstrated nitrite positive and > 30 WBC's, will send for culture  - Patient is instructed to increase her water intake until the urine is pale yellow or clear.  I have advised her to take probiotics (yogurt, oral pills or vaginal suppositories), take cranberry pills or drink the juice and use the estrogen cream.  She is to take Vitamin C 1,000 mg daily to acidify the urine.   She should also avoid soaking in tubs and wipe front to back after urinating.  She may benefit from core strengthening exercises.  We can refer her to PT if she desires.    - Because of her history of recurrent UTI's, I have asked the patient to contact our office if she should experience symptoms of urinary tract infection so that we can CATH her for an urine specimen for urinalysis and culture. This is to prevent a skin contaminant from showing up in the urine culture.  If she should have her symptoms after hours or cannot get to our office, she should notify her other providers that she needs a catheterized specimen for UA and culture.   - I reviewed the symptoms of a urinary tract infection, such as a worsening of urinary urgency and frequency, dysuria, which is painful urination and not the pain of urine hitting sensitive perineal  skin, hematuria, foul-smelling urine, suprapubic pain or mental status changes. Fevers, chills, nausea and or vomiting can also be signs of a possible UTI.  Positive urinalyses and positive urine cultures that are not associated with urinary symptoms should not be treated with antibiotics.    - I explained to the patient that being exposed to unnecessary antibiotics can put her at risk for increasing resistance of the bacteria to antibiotics, C. difficile and the side effects of the antibiotics.    3. Incontinence  - resume PTNS   4. Renal cyst  - history of left renal cyst  - obtain RUS to evaluate for possible hydronephrosis and/or nephrolithiasis                           Return for renal ultrasound report and schedule PTNS on separate day.  These notes generated with voice recognition software. I apologize for typographical errors.  Michiel CowboySHANNON Julius Boniface, PA-C  [facility] [address], [address] [place], [place] [address] [phone]

## 2016-09-21 NOTE — Progress Notes (Signed)
In and Out Catheterization  Patient is present today for a I & O catheterization due to recurrent UTI. Patient was cleaned and prepped in a sterile fashion with betadine.  A 14 FR cath was inserted no complications were noted , 100 ml of urine return was noted, urine was cloudy yellow in color. A clean urine sample was collected for UA.  Bladder was drained  And catheter was removed with out difficulty.    Preformed by: K.Chanelle Hodsdon, CMA

## 2016-09-23 LAB — CULTURE, URINE COMPREHENSIVE

## 2016-09-29 ENCOUNTER — Ambulatory Visit
Admission: RE | Admit: 2016-09-29 | Discharge: 2016-09-29 | Disposition: A | Discharge status: Home / Self Care | Payer: Medicare Other | Source: Ambulatory Visit | Attending: Urology | Admitting: Urology

## 2016-09-29 DIAGNOSIS — R9341 Abnormal radiologic findings on diagnostic imaging of renal pelvis, ureter, or bladder: Secondary | ICD-10-CM | POA: Insufficient documentation

## 2016-09-29 DIAGNOSIS — N281 Cyst of kidney, acquired: Secondary | ICD-10-CM | POA: Diagnosis present

## 2016-10-01 ENCOUNTER — Telehealth: Payer: Self-pay | Admitting: Urology

## 2016-10-01 ENCOUNTER — Ambulatory Visit: Payer: Medicare Other | Admitting: Urology

## 2016-10-01 ENCOUNTER — Encounter: Payer: Self-pay | Admitting: Urology

## 2016-10-01 NOTE — Telephone Encounter (Signed)
Please contact Mrs. Pamela [name] and have her reschedule her appointment. Her ultrasound is abnormal and we need to discuss our next steps.

## 2016-10-05 NOTE — Telephone Encounter (Signed)
Called the patient, no answer, she does have an appt for PTNS on the 7th   Pamela [name]

## 2016-10-06 ENCOUNTER — Ambulatory Visit (INDEPENDENT_AMBULATORY_CARE_PROVIDER_SITE_OTHER): Payer: Medicare Other | Admitting: Urology

## 2016-10-06 ENCOUNTER — Encounter: Payer: Self-pay | Admitting: Urology

## 2016-10-06 VITALS — BP 148/75 | HR 70 | Ht 66.0 in | Wt 159.9 lb

## 2016-10-06 DIAGNOSIS — N3942 Incontinence without sensory awareness: Secondary | ICD-10-CM

## 2016-10-06 DIAGNOSIS — R300 Dysuria: Secondary | ICD-10-CM

## 2016-10-06 DIAGNOSIS — N390 Urinary tract infection, site not specified: Secondary | ICD-10-CM

## 2016-10-06 DIAGNOSIS — N3289 Other specified disorders of bladder: Secondary | ICD-10-CM | POA: Diagnosis not present

## 2016-10-06 DIAGNOSIS — N281 Cyst of kidney, acquired: Secondary | ICD-10-CM | POA: Diagnosis not present

## 2016-10-06 LAB — URINALYSIS, COMPLETE

## 2016-10-06 LAB — MICROSCOPIC EXAMINATION
EPITHELIAL CELLS (NON RENAL): NONE SEEN /HPF (ref 0–10)
RBC, UA: NONE SEEN /hpf (ref 0–?)
WBC, UA: >30 /hpf — AB (ref 0–?)

## 2016-10-06 NOTE — Progress Notes (Signed)
In and Out Catheterization  Patient is present today for a I & O catheterization due to dysuria with a history of recurrent uti's. Patient was cleaned and prepped in a sterile fashion with bedtadine and Lidocaine 2% jelly was instilled into the urethra.  A 14FR cath was inserted no complications were noted , 155ml of urine return was noted, urine was orange  in color due to patient taking Azo. A clean urine sample was collected for Urinalysis. Bladder was drained and catheter was removed with out difficulty.    Preformed by: Dallas Schimkeamona Williams CMA

## 2016-10-06 NOTE — Progress Notes (Signed)
10/06/2016 2:05 PM   Pamela [name] August 21, 1934 [id number]  Referring provider: Mick Sell, MD [address] [facility] [address] [facility] - INFECTIOUS DISEASE [place], [place] [address]  Chief Complaint  Patient presents with  . Results    [place]  . Dysuria    patient states having burning    HPI: Patient is an 82 year old Caucasian female who is referred to [place] by Dr. Sampson Goon for bladder thickening and incontinence.    Bladder wall thickening A CT of the pelvis was performed on 04/14/2012 and it noted no hydronephrosis, but irregular thickened bladder wall with debris within the bladder and left renal cysts.  Patient underwent a cystoscopy on 09/27/2014 with Dr. Lonna Cobb and no abnormalities were noted.  Renal ultrasound completed on 09/29/2016 noted that the urinary bladder wall is irregularly thickened with increased vascularity questioning significant inflammatory change versus possible neoplastic infiltration within the urinary bladder. And a left renal cyst which appears slightly larger than previous study.  I have independently reviewed the films.  Recurrent UTI Patient states that she has had at least five urinary tract infections over the last year.  Her symptoms with a urinary tract infection consist of urgency, hesitancy and dysuria.  She denies gross hematuria, suprapubic pain, back pain, abdominal pain or flank pain.  She has not had any recent fevers, chills, nausea or vomiting.   She states she had a sling procedure with at [facility].  She does not have a history of nephrolithiasis or GU trauma.  Reviewing her records,  she has had 3 documented UTI's.   She had two positive for Proteus mirabilis and Klebsiella.  She is not sexually active.  She is post menopausal.  She is applying vaginal estrogen cream.  She admits to constipation.  She does engage in good perineal hygiene. She does not take tub baths.  She has incontinence.  She is using incontinence pads.  She is  going through 7 to 8 pads daily.   She is not having pain with bladder filling.  She has not had any recent imaging studies.  She is drinking two to three 8oz bottles of water daily.  She is also taking Vitamin C, cranberry juice and probiotics.  CATH UA from 09/21/2016 noted nitrite positive and > 30 WBC's.  She was on Keflex.  That culture grew out more than 3 organisms.  She is cathed again today and this UA demonstrates >30WBC's, moderate bacteria and it is nitrite positive.  She is taking Pyridium.    Incontinence Patient was not a candidate for anticholinergics and she was not a candidate for Myrbetriq due to her HTN.  She did go through a series of 12 weekly PTNS, but she has missed several maintenance PTNS.  Her baseline urinary symptoms consist of frequency, urgency, nocturia, straining to urinate, hesitancy and a weak urinary stream.  She would like to restart the PTNS once her bladder abnormality is evaluated.    PMH: Past Medical History:  Diagnosis Date  . A-fib (HCC) 08/28/2014   Overview:  Overview:  Diastolic dysfunction on ECHO 04/2012-done at [facility]   . Abnormal gait 02/08/2012  . Abscess or cellulitis of leg 04/18/2012  . Absence of bladder continence 07/29/2012  . Acid reflux 02/08/2012  . Acute kidney failure (HCC) 05/15/2012  . Afib, paroxysmal, with RVR at times with BBB 05/13/2012  . Allergic drug rash 05/15/2012  . Anemia 05/13/2012  . 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 Benign essential HTN  02/08/2012  . Bladder infection, chronic 06/29/2013  . BP (high blood pressure) 05/12/2012  . Bursitis of knee 08/07/2014  . Cellulitis in diabetic foot (HCC) 05/12/2012  . Chronic kidney disease 06/01/2012   Overview:  Overview:   12/2012- Renal US, simple L cyst. UIEP normal   . Clinical depression 08/28/2014  . CN (constipation) 03/17/2012  . Dermatitis due to drug reaction 05/15/2012  . Diabetes mellitus   . Diastolic dysfunction, left ventricle 05/14/2012  . DM (diabetes mellitus) (HCC)  05/12/2012  . Dysrhythmia   . Edema leg 07/02/2014  . Enthesopathy of knee 08/07/2014  . Excess fluid volume 05/20/2012  . FOM (frequency of micturition) 07/29/2012  . Frank hematuria 09/27/2014  . Gangrene (HCC)   . High cholesterol   . HTN (hypertension) 05/12/2012  . Hypercholesterolemia 05/12/2012  . Hypertension   . Incomplete bladder emptying 07/29/2012  . Open wound of knee, leg (except thigh), and ankle 06/01/2012  . 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   Overview:  Overview:  Several years ago, allergic reaction to sulfa antibiotic Several years ago, allergic reaction to sulfa antibiotic     Surgical History: Past Surgical History:  Procedure Laterality Date  . ABDOMINAL HYSTERECTOMY    . cataract surgery    . ENDOVENOUS ABLATION SAPHENOUS VEIN W/ LASER    . TOE AMPUTATION    . TONSILLECTOMY      Home Medications:    Medication List       Accurate as of 10/06/16  2:05 PM. Always use your most recent med list.          ALIGN 4 MG Caps Take by mouth. Reported on 02/26/2016   amiodarone 200 MG tablet Commonly known as:  PACERONE TAKE 1 TABLET (200 MG TOTAL) BY MOUTH ONCE DAILY.   amitriptyline 50 MG tablet Commonly known as:  ELAVIL TAKE 1 TABLET (50 MG TOTAL) BY MOUTH NIGHTLY.   atorvastatin 40 MG tablet Commonly known as:  LIPITOR Take 40 mg by mouth daily.   calcium carbonate 1250 (500 Ca) MG tablet Commonly known as:  OS-CAL - dosed in mg of elemental calcium Take 1 tablet (500 mg of elemental calcium total) by mouth 3 (three) times daily.   estradiol 0.1 MG/GM vaginal cream Commonly known as:  ESTRACE Place 1/2 Gram (pea size amount) daily at urethral opening daily for 2 weeks, then use 3 times per week.   glucose blood test strip Reported on 12/18/2015   HUMALOG MIX 75/25 (75-25) 100 UNIT/ML Susp injection Generic drug:  insulin lispro protamine-lispro   lisinopril 20 MG tablet Commonly known as:  PRINIVIL,ZESTRIL Take 20 mg by mouth daily.     magnesium 30 MG tablet Take 30 mg by mouth 2 (two) times daily.   metFORMIN 1000 MG tablet Commonly known as:  GLUCOPHAGE Take 1,000 mg by mouth 2 (two) times daily.   metoCLOPramide 5 MG tablet Commonly known as:  REGLAN Take 1 tablet (5 mg total) by mouth 4 (four) times daily -  before meals and at bedtime.   metoprolol 50 MG tablet Commonly known as:  LOPRESSOR Take 1 tablet (50 mg total) by mouth 2 (two) times daily.   NOVOLOG MIX 70/30 FLEXPEN (70-30) 100 UNIT/ML FlexPen Generic drug:  insulin aspart protamine - aspart INJECT 20 UNITS SUBQ EVERY MORNING AND 18 UNITS DAILY AT 5PM   nystatin cream Commonly known as:  MYCOSTATIN Apply 1 application topically 2 (two) times daily.   pantoprazole 40 MG tablet Commonly known as:  PROTONIX Take 40 mg by mouth daily.   PHARMACIST CHOICE LANCETS Misc USE 2 TIMES DAILY FOR DIABETIC TESTING   polyethylene glycol packet Commonly known as:  MIRALAX / GLYCOLAX Take by mouth. Reported on 12/18/2015   tamsulosin 0.4 MG Caps capsule Commonly known as:  FLOMAX Take by mouth.       Allergies:  Allergies  Allergen Reactions  . Ciprofloxacin Anaphylaxis  . Sulfamethoxazole-Trimethoprim Other (See Comments) and Rash    Went into [employer].  . Augmentin [Amoxicillin-Pot Clavulanate] Hives  . Levofloxacin Other (See Comments)  . Losartan Potassium-Hctz Other (See Comments)    Other reaction(s): Unknown  . Nitrofurantoin Other (See Comments)  . Sulfa Antibiotics Other (See Comments)    Levonne Spiller Steven's Johnsons  . Sulfasalazine     unknown  . Levaquin [Levofloxacin In D5w]     Family History: Family History  Problem Relation Age of Onset  . Diabetes Mellitus II Mother   . Heart disease Mother   . Lung cancer Father   . Diabetes Paternal Grandmother   . Diabetes Maternal Grandmother   . Kidney disease Neg Hx   . Prostate cancer Neg Hx   . Bladder Cancer Neg Hx     Social History:  reports that  she has never smoked. She has never used smokeless tobacco. She reports that she does not drink alcohol or use drugs.  ROS: UROLOGY Frequent Urination?: Yes Hard to postpone urination?: Yes Burning/pain with urination?: Yes Get up at night to urinate?: Yes Leakage of urine?: Yes Urine stream starts and stops?: Yes Trouble starting stream?: Yes Do you have to strain to urinate?: Yes Blood in urine?: No Urinary tract infection?: Yes Sexually transmitted disease?: No Injury to kidneys or bladder?: No Painful intercourse?: No Weak stream?: Yes Currently pregnant?: No Vaginal bleeding?: No Last menstrual period?: n  Gastrointestinal Nausea?: No Vomiting?: No Indigestion/heartburn?: No Diarrhea?: Yes Constipation?: Yes  Constitutional Fever: No Night sweats?: No Weight loss?: Yes Fatigue?: Yes  Skin Skin rash/lesions?: No Itching?: Yes  Eyes Blurred vision?: No Double vision?: No  Ears/Nose/Throat Sore throat?: No Sinus problems?: No  Hematologic/Lymphatic Swollen glands?: No Easy bruising?: Yes  Cardiovascular Leg swelling?: No Chest pain?: No  Respiratory Cough?: No Shortness of breath?: Yes  Endocrine Excessive thirst?: No  Musculoskeletal Back pain?: No Joint pain?: No  Neurological Headaches?: No Dizziness?: No  Psychologic Depression?: No Anxiety?: No  Physical Exam: BP (!) 148/75   Pulse 70   Ht 5\' 6"  (1.676 m)   Wt 159 lb 14.4 oz (72.5 kg)   BMI 25.81 kg/m   Constitutional: Well nourished. Alert and oriented, No acute distress. HEENT: [place] AT, moist mucus membranes. Trachea midline, no masses. Cardiovascular: No clubbing, cyanosis, or edema. Respiratory: Normal respiratory effort, no increased work of breathing. GI: Abdomen is soft, non tender, non distended, no abdominal masses. Liver and spleen not palpable.  No hernias appreciated.  Stool sample for occult testing is not indicated.   GU: No CVA tenderness.  No bladder fullness  or masses.  Atrophic external genitalia, normal pubic hair distribution, no lesions.  Normal urethral meatus, no lesions, no prolapse, no discharge.   No urethral masses, tenderness and/or tenderness. No bladder fullness, tenderness or masses. Pale vagina mucosa, poor estrogen effect, no discharge, no lesions, good pelvic support, no cystocele or rectocele noted.  Pessary is in place.  Cervix and uterus are surgically absent.  No adnexal/parametria masses or tenderness noted.  Anus and perineum are without  rashes or lesions.    Skin: No rashes, bruises or suspicious lesions. Lymph: No cervical or inguinal adenopathy. Neurologic: Grossly intact, no focal deficits, moving all 4 extremities. Psychiatric: Normal mood and affect.  Laboratory Data: Lab Results  Component Value Date   WBC 8.3 06/22/2016   HGB 10.3 (L) 06/22/2016   HCT 31.1 (L) 06/22/2016   MCV 96.3 06/22/2016   PLT 246 06/22/2016    Lab Results  Component Value Date   CREATININE 1.45 (H) 06/22/2016    Lab Results  Component Value Date   HGBA1C 7.6 (H) 05/13/2012    Lab Results  Component Value Date   TSH 2.546 05/19/2012       Component Value Date/Time   CHOL 119 07/08/2013 0739   HDL 58 07/08/2013 0739   VLDL 22 07/08/2013 0739   LDLCALC 39 07/08/2013 0739    Lab Results  Component Value Date   AST 21 06/22/2016   Lab Results  Component Value Date   ALT 20 06/22/2016     Urinalysis Nitrite positive, moderate bacteria and > 30 WBC's.  Patient on Pyridium.  See EPIC.    Assessment & Plan:    1. Bladder wall irregularity  - seen on CT in 2013  - cystoscopy performed in 2015 showed no abnormality  - no gross hematuria  - abnormality is present on recent RUS, schedule cystoscopy   2. Recurrent UTI's  - CATH UA today demonstrated nitrite positive, moderate bacteria and > 30 WBC's, will send for culture  - Patient is instructed to increase her water intake until the urine is pale yellow or clear.  I  have advised her to take probiotics (yogurt, oral pills or vaginal suppositories), take cranberry pills or drink the juice and use the estrogen cream.  She is to take Vitamin C 1,000 mg daily to acidify the urine.   She should also avoid soaking in tubs and wipe front to back after urinating.  She may benefit from core strengthening exercises.  We can refer her to PT if she desires.    - Because of her history of recurrent UTI's, I have asked the patient to contact our office if she should experience symptoms of urinary tract infection so that we can CATH her for an urine specimen for urinalysis and culture. This is to prevent a skin contaminant from showing up in the urine culture.  If she should have her symptoms after hours or cannot get to our office, she should notify her other providers that she needs a catheterized specimen for UA and culture.   - I reviewed the symptoms of a urinary tract infection, such as a worsening of urinary urgency and frequency, dysuria, which is painful urination and not the pain of urine hitting sensitive perineal skin, hematuria, foul-smelling urine, suprapubic pain or mental status changes. Fevers, chills, nausea and or vomiting can also be signs of a possible UTI.  Positive urinalyses and positive urine cultures that are not associated with urinary symptoms should not be treated with antibiotics.    - I explained to the patient that being exposed to unnecessary antibiotics can put her at risk for increasing resistance of the bacteria to antibiotics, C. difficile and the side effects of the antibiotics.    3. Incontinence  - resume PTNS, once bladder abnormality is evaluated  4. Renal cyst  - left renal cyst slightly larger than on 2013 study  Return for cystoscopy.  These notes generated with voice recognition software. I apologize for typographical errors.  Michiel Cowboy, PA-C  [facility] [address] [place], [place] [address] [phone]

## 2016-10-10 LAB — CULTURE, URINE COMPREHENSIVE

## 2016-10-12 ENCOUNTER — Telehealth: Payer: Self-pay

## 2016-10-12 ENCOUNTER — Other Ambulatory Visit: Payer: Self-pay | Admitting: Urology

## 2016-10-12 DIAGNOSIS — N390 Urinary tract infection, site not specified: Secondary | ICD-10-CM

## 2016-10-12 MED ORDER — CEPHALEXIN 500 MG PO CAPS: 500.0000 mg | ORAL_CAPSULE | Freq: Two times a day (BID) | ORAL | 0 refills | Status: DC

## 2016-10-12 NOTE — Telephone Encounter (Signed)
Spoke with pt husband in reference to +ucx. Made aware abx were sent to pharmacy. Husband voiced understanding.  

## 2016-10-12 NOTE — Telephone Encounter (Signed)
-----   Message from Harle BattiestShannon A McGowan, PA-C sent at 10/12/2016 12:04 AM EST ----- Patient has a +UCx.  They need to start Keflex 500 mg, one tablet twice daily for seven days.  They also need to take a probiotic with the antibiotic course.  The dosage is listed below:  L. acidophilus and L. casei (25 x 109 CFU/day for 2 days, then 50 x 109 CFU/day for duration of the antibiotic course)  I have e scribed [facility] [address]

## 2016-10-18 ENCOUNTER — Telehealth: Payer: Self-pay | Admitting: Urology

## 2016-10-18 NOTE — Telephone Encounter (Signed)
I would have Mrs. Ebony CargoClayton come after Thanksgiving (Monday or Tuesday) to obtain a CATH UA to make sure she doesn't have an infection for her cystoscopy on the 4th.

## 2016-10-19 ENCOUNTER — Ambulatory Visit: Payer: Self-pay

## 2016-10-19 NOTE — Telephone Encounter (Signed)
Spoke with pt in reference to needing a cath specimen after thanksgiving. Pt was added to nurse schedule.

## 2016-10-26 ENCOUNTER — Ambulatory Visit (INDEPENDENT_AMBULATORY_CARE_PROVIDER_SITE_OTHER): Payer: Medicare Other

## 2016-10-26 VITALS — BP 151/73 | HR 74 | Ht 65.0 in | Wt 161.7 lb

## 2016-10-26 DIAGNOSIS — N390 Urinary tract infection, site not specified: Secondary | ICD-10-CM | POA: Diagnosis not present

## 2016-10-26 LAB — URINALYSIS, COMPLETE
BILIRUBIN UA: NEGATIVE
GLUCOSE, UA: NEGATIVE
NITRITE UA: POSITIVE — AB
SPEC GRAV UA: 1.025 (ref 1.005–1.030)
Urobilinogen, Ur: 1 mg/dL (ref 0.2–1.0)
pH, UA: 7 (ref 5.0–7.5)

## 2016-10-26 LAB — MICROSCOPIC EXAMINATION
Epithelial Cells (non renal): NONE SEEN /hpf (ref 0–10)
RBC, UA: NONE SEEN /hpf (ref 0–?)
WBC, UA: >30 /hpf — AB (ref 0–?)

## 2016-10-26 NOTE — Progress Notes (Signed)
In and Out Catheterization  Patient is present today for a I & O catheterization due to recurrent UTI. Patient was cleaned and prepped in a sterile fashion with betadine and Lidocaine 2% jelly was instilled into the urethra.  A 14FR cath was inserted no complications were noted , 150ml of urine return was noted, urine was cloudy yellow in color. A clean urine sample was collected for u/aand cx. Bladder was drained  And catheter was removed with out difficulty.    Preformed by: Rupert Stackshelsea Bricelyn Freestone, LPN   Blood pressure (!) 151/73, pulse 74, height 5\' 5"  (1.651 m), weight 161 lb 11.2 oz (73.3 kg).

## 2016-10-29 ENCOUNTER — Telehealth: Payer: Self-pay

## 2016-10-29 DIAGNOSIS — N390 Urinary tract infection, site not specified: Secondary | ICD-10-CM

## 2016-10-29 LAB — CULTURE, URINE COMPREHENSIVE

## 2016-10-29 MED ORDER — CEPHALEXIN 500 MG PO CAPS: 500.0000 mg | ORAL_CAPSULE | Freq: Two times a day (BID) | ORAL | 0 refills | Status: DC

## 2016-10-29 NOTE — Telephone Encounter (Signed)
Line busy

## 2016-10-29 NOTE — Telephone Encounter (Signed)
-----   Message from Harle BattiestShannon A McGowan, PA-C sent at 10/29/2016  1:04 PM EST ----- Please have the patient continue her Keflex until her cystoscopy.

## 2016-11-02 ENCOUNTER — Ambulatory Visit (INDEPENDENT_AMBULATORY_CARE_PROVIDER_SITE_OTHER): Payer: Medicare Other | Admitting: Urology

## 2016-11-02 VITALS — BP 150/69 | HR 52 | Ht 62.0 in | Wt 163.6 lb

## 2016-11-02 DIAGNOSIS — R350 Frequency of micturition: Secondary | ICD-10-CM

## 2016-11-02 DIAGNOSIS — N3020 Other chronic cystitis without hematuria: Secondary | ICD-10-CM | POA: Diagnosis not present

## 2016-11-02 LAB — URINALYSIS, COMPLETE
Bilirubin, UA: NEGATIVE
GLUCOSE, UA: NEGATIVE
NITRITE UA: POSITIVE — AB
SPEC GRAV UA: 1.025 (ref 1.005–1.030)
Urobilinogen, Ur: 0.2 mg/dL (ref 0.2–1.0)
pH, UA: 7 (ref 5.0–7.5)

## 2016-11-02 LAB — MICROSCOPIC EXAMINATION: RBC, UA: NONE SEEN /hpf (ref 0–?)

## 2016-11-02 MED ORDER — LIDOCAINE HCL 2 % EX GEL: 1.0000 "application " | Freq: Once | CUTANEOUS | Status: DC

## 2016-11-02 NOTE — Progress Notes (Signed)
82 year old female with a long history of urologic issues.She had a cystocele repair more than 20 years ago by Dr. Cassell [name]. She has since had treatment for recurrent urinary tract infections includingnitrofurantoin on a nightly basis for many years. In addition, she has been given estrogen therapy and a estrogen ring which is still being prescribed. She has recently had a series of Klebsiella  UTIs hat do not appear to have been completely treated. She was last prescribed this last week, she has not startreatment yet.  She has been prescribed Keflex.  She feels that she does not completely empty her bladder.she is complaining of pain at thetip of her bladder. She denies any fevers. She does have worsening incontinence.  The patient has had many cystoscopies in the past. It was noted that she had trabeculations as well as diverticulum on her last cystoscopy by Dr. Patsi [name] in 2010.   Current Outpatient Prescriptions on File Prior to Visit  Medication Sig Dispense Refill  . amiodarone (PACERONE) 200 MG tablet TAKE 1 TABLET (200 MG TOTAL) BY MOUTH ONCE DAILY.    Marland Kitchen. amitriptyline (ELAVIL) 50 MG tablet TAKE 1 TABLET (50 MG TOTAL) BY MOUTH NIGHTLY.  11  . atorvastatin (LIPITOR) 40 MG tablet Take 40 mg by mouth daily.    Marland Kitchen. estradiol (ESTRACE) 0.1 MG/GM vaginal cream Place 1/2 Gram (pea size amount) daily at urethral opening daily for 2 weeks, then use 3 times per week.    Marland Kitchen. glucose blood test strip Reported on 12/18/2015    . insulin lispro protamine-lispro (HUMALOG MIX 75/25) (75-25) 100 UNIT/ML SUSP injection     . lisinopril (PRINIVIL,ZESTRIL) 20 MG tablet Take 20 mg by mouth daily.    . magnesium 30 MG tablet Take 30 mg by mouth 2 (two) times daily.    . metFORMIN (GLUCOPHAGE) 1000 MG tablet Take 1,000 mg by mouth 2 (two) times daily.    . metoprolol (LOPRESSOR) 50 MG tablet Take 1 tablet (50 mg total) by mouth 2 (two) times daily. 60 tablet 1  . NOVOLOG MIX 70/30 FLEXPEN (70-30) 100 UNIT/ML FlexPen  INJECT 20 UNITS SUBQ EVERY MORNING AND 18 UNITS DAILY AT 5PM  2  . pantoprazole (PROTONIX) 40 MG tablet Take 40 mg by mouth daily.    Marland Kitchen. PHARMACIST CHOICE LANCETS MISC USE 2 TIMES DAILY FOR DIABETIC TESTING    . polyethylene glycol (MIRALAX / GLYCOLAX) packet Take by mouth. Reported on 12/18/2015    . Probiotic Product (ALIGN) 4 MG CAPS Take by mouth. Reported on 02/26/2016    . tamsulosin (FLOMAX) 0.4 MG CAPS capsule Take by mouth.    . calcium carbonate (OS-CAL - DOSED IN MG OF ELEMENTAL CALCIUM) 1250 MG tablet Take 1 tablet (500 mg of elemental calcium total) by mouth 3 (three) times daily. 90 tablet 1  . cephALEXin (KEFLEX) 500 MG capsule Take 1 capsule (500 mg total) by mouth 2 (two) times daily. (Patient not taking: Reported on 11/02/2016) 14 capsule 0  . metoCLOPramide (REGLAN) 5 MG tablet Take 1 tablet (5 mg total) by mouth 4 (four) times daily -  before meals and at bedtime. 120 tablet 1  . nystatin cream (MYCOSTATIN) Apply 1 application topically 2 (two) times daily. (Patient not taking: Reported on 11/02/2016) 30 g 0  . Phenazopyridine HCl (AZO-STANDARD PO) Take by mouth.     No current facility-administered medications on file prior to visit.    PLAN: Past Surgical History:  Procedure Laterality Date  . ABDOMINAL HYSTERECTOMY    .  cataract surgery    . ENDOVENOUS ABLATION SAPHENOUS VEIN W/ LASER    . TOE AMPUTATION    . TONSILLECTOMY     PE Vitals:   11/02/16 1008  BP: (!) 150/69  Pulse: (!) 52   NAD  UA - grossly positive for infection based on dip.  Imp: 82 year old female with recurrent urinary tract infections and along history of incomplete bladder emptying. She currently has an infection that has been incompletely treated.  The patient will return in 2 weeks after completing the course of antibiotics as prescribed. At that point, we can di or not cystoscopy is necessary for further workup of her recurrent UTIs. I did recommend she start taking cranberry tablets 2  tablets twice daily as well as lactobacillus probiotic.

## 2016-11-02 NOTE — Telephone Encounter (Signed)
Pt was seen today.

## 2016-11-06 ENCOUNTER — Telehealth: Payer: Self-pay

## 2016-11-06 NOTE — Telephone Encounter (Signed)
Pt called stating she was confused about the abx she was supposed to be taking. Pt then stated that on Monday, 11/02/16, she picked up a 7 day supply of bactrim and only has 2 pills left. Pt stated that she has been taking 3-4 daily. Reinforced with pt she is taking to much at one time. Reinforced with pt to take one pill this morning and the last one tonight. Pt will RTC Monday for a cath specimen. Pt voiced understanding of whole conversation.

## 2016-11-09 ENCOUNTER — Ambulatory Visit (INDEPENDENT_AMBULATORY_CARE_PROVIDER_SITE_OTHER): Payer: Medicare Other

## 2016-11-09 VITALS — BP 166/43 | HR 60 | Temp 97.4°F | Wt 157.0 lb

## 2016-11-09 DIAGNOSIS — N390 Urinary tract infection, site not specified: Secondary | ICD-10-CM | POA: Diagnosis not present

## 2016-11-09 LAB — URINALYSIS, COMPLETE
BILIRUBIN UA: NEGATIVE
GLUCOSE, UA: NEGATIVE
NITRITE UA: POSITIVE — AB
SPEC GRAV UA: 1.025 (ref 1.005–1.030)
Urobilinogen, Ur: 0.2 mg/dL (ref 0.2–1.0)
pH, UA: 7 (ref 5.0–7.5)

## 2016-11-09 LAB — MICROSCOPIC EXAMINATION
EPITHELIAL CELLS (NON RENAL): NONE SEEN /HPF (ref 0–10)
RBC MICROSCOPIC, UA: NONE SEEN /HPF (ref 0–?)
WBC, UA: >30 /hpf — AB (ref 0–?)

## 2016-11-09 NOTE — Progress Notes (Signed)
In and Out Catheterization  Patient is present today for a I & O catheterization due to recurrent UTI. Patient was cleaned and prepped in a sterile fashion with betadine and Lidocaine 2% jelly was instilled into the urethra.  A 12 FR cath was inserted no complications were noted , 20 ml of urine return was noted, urine was cloudy yellow in color. A clean urine sample was collected for urine culture.  Bladder was drained  And catheter was removed with out difficulty.    Preformed by: K.Macenzie Burford,CMA  Follow up/ Additional notes: as directed  Blood pressure (!) 166/43, pulse 60, temperature 97.4 F (36.3 C), weight 157 lb (71.2 kg).

## 2016-11-12 ENCOUNTER — Telehealth: Payer: Self-pay

## 2016-11-12 DIAGNOSIS — N390 Urinary tract infection, site not specified: Secondary | ICD-10-CM

## 2016-11-12 LAB — CULTURE, URINE COMPREHENSIVE

## 2016-11-12 MED ORDER — CEPHALEXIN 500 MG PO CAPS: 500.0000 mg | ORAL_CAPSULE | Freq: Two times a day (BID) | ORAL | 0 refills | Status: DC

## 2016-11-12 NOTE — Telephone Encounter (Signed)
LMOM-medication sent to pharmacy 

## 2016-11-12 NOTE — Telephone Encounter (Signed)
Spoke with pt in reference to +ucx and abx. Reinforced with pt to only take 1 tab in the morning and night. Pt voiced understanding.

## 2016-11-12 NOTE — Telephone Encounter (Signed)
-----   Message from Harle BattiestShannon A McGowan, PA-C sent at 11/12/2016  8:08 AM EST ----- Patient has a +UCx.  They need to start Keflex 250 mg, one tablet twice daily for seven days.  They also need to take a probiotic with the antibiotic course.  The dosage is listed below:  L. acidophilus and L. casei (25 x 109 CFU/day for 2 days, then 50 x 109 CFU/day for duration of the antibiotic course)

## 2016-11-20 ENCOUNTER — Other Ambulatory Visit: Payer: Self-pay

## 2016-11-20 ENCOUNTER — Ambulatory Visit (INDEPENDENT_AMBULATORY_CARE_PROVIDER_SITE_OTHER): Payer: Medicare Other

## 2016-11-20 ENCOUNTER — Other Ambulatory Visit: Payer: Medicare Other

## 2016-11-20 VITALS — BP 150/75 | HR 70 | Ht 66.0 in | Wt 159.0 lb

## 2016-11-20 DIAGNOSIS — N390 Urinary tract infection, site not specified: Secondary | ICD-10-CM

## 2016-11-20 LAB — MICROSCOPIC EXAMINATION
EPITHELIAL CELLS (NON RENAL): NONE SEEN /HPF (ref 0–10)
RBC MICROSCOPIC, UA: NONE SEEN /HPF (ref 0–?)
WBC, UA: >30 /hpf — AB (ref 0–?)

## 2016-11-20 LAB — URINALYSIS, COMPLETE
Bilirubin, UA: NEGATIVE
Glucose, UA: NEGATIVE
NITRITE UA: POSITIVE — AB
PH UA: 7 (ref 5.0–7.5)
SPEC GRAV UA: 1.025 (ref 1.005–1.030)
Urobilinogen, Ur: 0.2 mg/dL (ref 0.2–1.0)

## 2016-11-20 NOTE — Progress Notes (Signed)
Pt presents today with c/o urinary frequency and urgency, hard to postpone urination, and leakage of urine. Pt has been on abx several times in the last 30 days for a UTI. A CATH specimen was obtained for u/a and cx.   Blood pressure (!) 150/75, pulse 70, height 5\' 6"  (1.676 m), weight 159 lb (72.1 kg).

## 2016-11-24 LAB — CULTURE, URINE COMPREHENSIVE

## 2016-11-26 ENCOUNTER — Telehealth: Payer: Self-pay

## 2016-11-26 NOTE — Telephone Encounter (Signed)
Pamela LaserBrian James Budzyn, MD  Pamela Latchhelsea C Watkins, LPN        Please let patient know she has UTI. She is allergic to almost everything it is sensitive to. She will need to come to office for 3 consecutive days for gentamycin 80 mg shot. Not sure how this will logistically work since she wouldn't be able to get her first shot until tomorrow. Maybe wait to next week if minimally symptomatic.    Spoke with pt in reference to needing gentamycin injections. Reinforced with pt the importance of having injections. Pt voiced understanding. appts were made for next week.

## 2016-12-01 ENCOUNTER — Ambulatory Visit (INDEPENDENT_AMBULATORY_CARE_PROVIDER_SITE_OTHER): Payer: Medicare Other

## 2016-12-01 VITALS — BP 121/76 | HR 91 | Ht 65.0 in | Wt 157.2 lb

## 2016-12-01 DIAGNOSIS — N390 Urinary tract infection, site not specified: Secondary | ICD-10-CM | POA: Diagnosis not present

## 2016-12-01 MED ORDER — GENTAMICIN SULFATE 40 MG/ML IJ SOLN
80.0000 mg | Freq: Once | INTRAMUSCULAR | Status: AC
  Administered 2016-12-01: 80 mg via INTRAMUSCULAR

## 2016-12-01 NOTE — Progress Notes (Signed)
IM Injection  Patient is present today for an IM Injection for treatment of UTI Drug: Gentamicin Dose:80mg  Location:left gluteal region Lot: [id number] Exp:12/2017 Patient tolerated well, no complications were noted  Preformed by: Rupert Stackshelsea Jilliam Bellmore, LPN   Additional notes/ Follow up: pt will f/u tomorrow for next injection.   Blood pressure 121/76, pulse 91, height 5\' 5"  (1.651 m), weight 157 lb 3.2 oz (71.3 kg).

## 2016-12-02 ENCOUNTER — Ambulatory Visit (INDEPENDENT_AMBULATORY_CARE_PROVIDER_SITE_OTHER): Payer: Medicare Other

## 2016-12-02 VITALS — BP 120/70 | HR 90 | Ht 66.0 in | Wt 156.2 lb

## 2016-12-02 DIAGNOSIS — N390 Urinary tract infection, site not specified: Secondary | ICD-10-CM | POA: Diagnosis not present

## 2016-12-02 MED ORDER — GENTAMICIN SULFATE 40 MG/ML IJ SOLN
80.0000 mg | Freq: Once | INTRAMUSCULAR | Status: AC
  Administered 2016-12-02: 80 mg via INTRAMUSCULAR

## 2016-12-02 NOTE — Progress Notes (Signed)
IM Injection  Patient is present today for an IM Injection for treatment of UTI Drug: Gentamicin Dose:80mg  Location:right upper gluteal region Lot: [id number] Exp:12/2017 Patient tolerated well, no complications were noted  Preformed by: Rupert Stackshelsea Watkins, LPN   Additional notes/ Follow up: tomorrow  Blood pressure 120/70, pulse 90, height 5\' 6"  (1.676 m), weight 156 lb 3.2 oz (70.9 kg).

## 2016-12-03 ENCOUNTER — Ambulatory Visit (INDEPENDENT_AMBULATORY_CARE_PROVIDER_SITE_OTHER): Payer: Medicare Other

## 2016-12-03 VITALS — BP 161/69 | HR 101 | Ht 67.0 in | Wt 158.8 lb

## 2016-12-03 DIAGNOSIS — N390 Urinary tract infection, site not specified: Secondary | ICD-10-CM | POA: Diagnosis not present

## 2016-12-03 MED ORDER — GENTAMICIN SULFATE 40 MG/ML IJ SOLN
80.0000 mg | Freq: Once | INTRAMUSCULAR | Status: AC
  Administered 2016-12-03: 80 mg via INTRAMUSCULAR

## 2016-12-03 NOTE — Progress Notes (Signed)
IM Injection  Patient is present today for an IM Injection for treatment of UTI Drug: gentamicin Dose:80mg  Location:left gluteal region Lot: [id number] Exp:12/2017 Patient tolerated well, no complications were noted  Preformed by: Rupert Stackshelsea Watkins, LPN   Blood pressure (!) 161/69, pulse (!) 101, height 5\' 7"  (1.702 m), weight 158 lb 12.8 oz (72 kg).

## 2016-12-14 ENCOUNTER — Telehealth: Payer: Self-pay | Admitting: Family Medicine

## 2016-12-14 MED ORDER — TERCONAZOLE 0.8 % VA CREA: 1.0000 | TOPICAL_CREAM | Freq: Every day | VAGINAL | 0 refills | Status: DC

## 2016-12-14 MED ORDER — TERCONAZOLE 0.8 % VA CREA: 1.0000 | TOPICAL_CREAM | VAGINAL | 0 refills | Status: DC

## 2016-12-14 NOTE — Telephone Encounter (Signed)
Patient called today stating she is having vaginal itching. The itching started yesterday. She does not have any urinary symptoms. She recently did a 3 day  Of Gentamycin for the urinary tract infection. She is wanting to know if she can get a cream for the vagina to help with the itching.

## 2016-12-14 NOTE — Telephone Encounter (Signed)
Patient notified

## 2016-12-14 NOTE — Telephone Encounter (Signed)
She can have Terazol cream three nights weekly.

## 2016-12-31 ENCOUNTER — Telehealth: Payer: Self-pay

## 2016-12-31 NOTE — Telephone Encounter (Signed)
Pt called stating Carollee HerterShannon gave her a vaginal insert for a yeast infection at the beginning of Jan. Pt stated that she was unaware there were 3 suppositories therefore she only took 1 dosage. Pt stated that her itching is much worse now. Front desk offered her an appt for next Wednesday. Pt stated that she needed help before then. Reinforced with pt can get OTC antifungal vaginal suppositories that should help take care of itching. Pt was added to [place] schedule for next Wednesday as a follow up. Pt voiced understanding of whole conversation.

## 2017-01-06 ENCOUNTER — Ambulatory Visit (INDEPENDENT_AMBULATORY_CARE_PROVIDER_SITE_OTHER): Payer: Medicare Other | Admitting: Urology

## 2017-01-06 ENCOUNTER — Encounter: Payer: Self-pay | Admitting: Urology

## 2017-01-06 VITALS — BP 171/71 | HR 68 | Ht 65.0 in | Wt 152.4 lb

## 2017-01-06 DIAGNOSIS — N390 Urinary tract infection, site not specified: Secondary | ICD-10-CM

## 2017-01-06 DIAGNOSIS — N3946 Mixed incontinence: Secondary | ICD-10-CM

## 2017-01-06 DIAGNOSIS — N281 Cyst of kidney, acquired: Secondary | ICD-10-CM | POA: Diagnosis not present

## 2017-01-06 DIAGNOSIS — N3289 Other specified disorders of bladder: Secondary | ICD-10-CM

## 2017-01-06 LAB — URINALYSIS, COMPLETE
Bilirubin, UA: NEGATIVE
GLUCOSE, UA: NEGATIVE
NITRITE UA: NEGATIVE
Specific Gravity, UA: 1.025 (ref 1.005–1.030)
Urobilinogen, Ur: 1 mg/dL (ref 0.2–1.0)
pH, UA: 7 (ref 5.0–7.5)

## 2017-01-06 LAB — MICROSCOPIC EXAMINATION: WBC, UA: >30 /hpf — ABNORMAL HIGH (ref 0–?)

## 2017-01-06 MED ORDER — GENTAMICIN SULFATE 40 MG/ML IJ SOLN
80.0000 mg | Freq: Once | INTRAMUSCULAR | Status: AC
  Administered 2017-01-06: 80 mg via INTRAMUSCULAR

## 2017-01-06 NOTE — Progress Notes (Signed)
01/06/2017 4:10 PM   Pamela [name] 11/09/1934 [id number]  Referring provider: Mick Sellavid P Fitzgerald, MD [address] [facility] [place], [place] [id number]  Chief Complaint  Patient presents with  . Follow-up    wants to talk to [facility] about yeast infection    HPI: Patient is an 83 year old Caucasian female who is referred to us by Dr. Sampson GoonFitzgerald for bladder thickening and incontinence.    Bladder wall thickening A CT of the pelvis was performed on 04/14/2012 and it noted no hydronephrosis, but irregular thickened bladder wall with debris within the bladder and left renal cysts.  Patient underwent a cystoscopy on 09/27/2014 with Dr. Lonna CobbStoioff and no abnormalities were noted.  Renal ultrasound completed on 09/29/2016 noted that the urinary bladder wall is irregularly thickened with increased vascularity questioning significant inflammatory change versus possible neoplastic infiltration within the urinary bladder. And a left renal cyst which appears slightly larger than previous study.  She has been unable to have a cystoscopy due to recurring infections.    Recurrent UTI Patient has had > 5 UTI's over the last year.  Risks for UTI's are age, incontinence and vaginal atrophy.  She continues to complain of burning, but due to her increasing forgetfulness, it is difficult to ascertain whether it is dysuria or vaginal burning.  Her CATH UA was positive for > 30 WBC's and > 30 RBC's with many bacteria.    Incontinence Patient was not a candidate for anticholinergics and she was not a candidate for Myrbetriq due to her HTN.  She did go through a series of 12 weekly PTNS, but she has missed several maintenance PTNS.  Her baseline urinary symptoms consist of frequency, urgency, nocturia, straining to urinate, hesitancy and a weak urinary stream.  She would like to restart the PTNS once her bladder abnormality is evaluated.  She is going through 7 to 8 pads daily.      PMH: Past Medical History:  Diagnosis Date  . A-fib (HCC) 08/28/2014   Overview:  Overview:  Diastolic dysfunction on E[facility] 04/2012-[facility]   . Abnormal gait 02/08/2012  . Abscess or cellulitis of leg 04/18/2012  . Absence of bladder continence 07/29/2012  . Acid reflux 02/08/2012  . Acute kidney failure (HCC) 05/15/2012  . Afib, paroxysmal, with RVR at times with BBB 05/13/2012  . Allergic drug rash 05/15/2012  . Anemia 05/13/2012  . 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 Benign essential HTN 02/08/2012  . Bladder infection, chronic 06/29/2013  . BP (high blood pressure) 05/12/2012  . Bursitis of knee 08/07/2014  . Cellulitis in diabetic foot (HCC) 05/12/2012  . Chronic kidney disease 06/01/2012   Overview:  Overview:   12/2012- Renal US, simple L cyst. UIEP normal   . Clinical depression 08/28/2014  . CN (constipation) 03/17/2012  . Dermatitis due to drug reaction 05/15/2012  . Diabetes mellitus   . Diastolic dysfunction, left ventricle 05/14/2012  . DM (diabetes mellitus) (HCC) 05/12/2012  . Dysrhythmia   . Edema leg 07/02/2014  . Enthesopathy of knee 08/07/2014  . Excess fluid volume 05/20/2012  . FOM (frequency of micturition) 07/29/2012  . Frank hematuria 09/27/2014  . Gangrene (HCC)   . High cholesterol   . HTN (hypertension) 05/12/2012  . Hypercholesterolemia 05/12/2012  . Hypertension   . Incomplete bladder emptying 07/29/2012  . Open wound of knee, leg (except thigh), and ankle 06/01/2012  . 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   Overview:  Overview:  Several years ago, allergic reaction to sulfa antibiotic Several years ago, allergic reaction to sulfa antibiotic     Surgical History: Past Surgical History:  Procedure Laterality Date  . ABDOMINAL HYSTERECTOMY    . cataract surgery    . ENDOVENOUS ABLATION SAPHENOUS VEIN W/ LASER    . TOE AMPUTATION    . TONSILLECTOMY      Home Medications:  Allergies as of 01/06/2017      Reactions   Ciprofloxacin Anaphylaxis    Sulfamethoxazole-trimethoprim Other (See Comments), Rash   Went into [employer] [place].   Augmentin [amoxicillin-pot Clavulanate] Hives   Levofloxacin Other (See Comments)   Losartan Potassium-hctz Other (See Comments)   Other reaction(s): Unknown   Nitrofurantoin Other (See Comments)   Sulfa Antibiotics Other (See Comments)   Levonne Spiller Steven's Johnsons   Sulfasalazine    unknown   Levaquin [levofloxacin In D5w]       Medication List       Accurate as of 01/06/17  4:10 PM. Always use your most recent med list.          ALIGN 4 MG Caps Take by mouth. Reported on 02/26/2016   amiodarone 200 MG tablet Commonly known as:  PACERONE TAKE 1 TABLET (200 MG TOTAL) BY MOUTH ONCE DAILY.   amitriptyline 50 MG tablet Commonly known as:  ELAVIL TAKE 1 TABLET (50 MG TOTAL) BY MOUTH NIGHTLY.   atorvastatin 40 MG tablet Commonly known as:  LIPITOR Take 40 mg by mouth daily.   AZO-STANDARD PO Take by mouth.   calcium carbonate 1250 (500 Ca) MG tablet Commonly known as:  OS-CAL - dosed in mg of elemental calcium Take 1 tablet (500 mg of elemental calcium total) by mouth 3 (three) times daily.   cephALEXin 500 MG capsule Commonly known as:  KEFLEX Take 1 capsule (500 mg total) by mouth 2 (two) times daily.   estradiol 0.1 MG/GM vaginal cream Commonly known as:  ESTRACE Place 1/2 Gram (pea size amount) daily at urethral opening daily for 2 weeks, then use 3 times per week.   glucose blood test strip Reported on 12/18/2015   HUMALOG MIX 75/25 (75-25) 100 UNIT/ML Susp injection Generic drug:  insulin lispro protamine-lispro   lisinopril 20 MG tablet Commonly known as:  PRINIVIL,ZESTRIL Take 20 mg by mouth daily.   magnesium 30 MG tablet Take 30 mg by mouth 2 (two) times daily.   metFORMIN 1000 MG tablet Commonly known as:  GLUCOPHAGE Take 1,000 mg by mouth 2 (two) times daily.   metoCLOPramide 5 MG tablet Commonly known as:  REGLAN Take 1 tablet (5 mg total)  by mouth 4 (four) times daily -  before meals and at bedtime.   metoprolol 50 MG tablet Commonly known as:  LOPRESSOR Take 1 tablet (50 mg total) by mouth 2 (two) times daily.   NOVOLOG MIX 70/30 FLEXPEN (70-30) 100 UNIT/ML FlexPen Generic drug:  insulin aspart protamine - aspart INJECT 20 UNITS SUBQ EVERY MORNING AND 18 UNITS DAILY AT 5PM   nystatin cream Commonly known as:  MYCOSTATIN Apply 1 application topically 2 (two) times daily.   pantoprazole 40 MG tablet Commonly known as:  PROTONIX Take 40 mg by mouth daily.   PHARMACIST CHOICE LANCETS Misc USE 2 TIMES DAILY FOR DIABETIC TESTING   polyethylene glycol packet Commonly known as:  MIRALAX / GLYCOLAX Take by mouth. Reported on 12/18/2015   tamsulosin 0.4 MG Caps capsule Commonly known as:  FLOMAX Take by mouth.   terconazole  0.8 % vaginal cream Commonly known as:  TERAZOL 3 Place 1 applicator vaginally 3 (three) times a week. 3 nights weekly       Allergies:  Allergies  Allergen Reactions  . Ciprofloxacin Anaphylaxis  . Sulfamethoxazole-Trimethoprim Other (See Comments) and Rash    Went into [employer].  . Augmentin [Amoxicillin-Pot Clavulanate] Hives  . Levofloxacin Other (See Comments)  . Losartan Potassium-Hctz Other (See Comments)    Other reaction(s): Unknown  . Nitrofurantoin Other (See Comments)  . Sulfa Antibiotics Other (See Comments)    Levonne Spiller Steven's Johnsons  . Sulfasalazine     unknown  . Levaquin [Levofloxacin In D5w]     Family History: Family History  Problem Relation Age of Onset  . Diabetes Mellitus II Mother   . Heart disease Mother   . Lung cancer Father   . Diabetes Paternal Grandmother   . Diabetes Maternal Grandmother   . Kidney disease Neg Hx   . Prostate cancer Neg Hx   . Bladder Cancer Neg Hx     Social History:  reports that she has never smoked. She has never used smokeless tobacco. She reports that she does not drink alcohol or use  drugs.  ROS: UROLOGY Frequent Urination?: No Hard to postpone urination?: No Burning/pain with urination?: No Get up at night to urinate?: No Leakage of urine?: No Urine stream starts and stops?: No Trouble starting stream?: No Do you have to strain to urinate?: No Blood in urine?: No Urinary tract infection?: No Sexually transmitted disease?: No Injury to kidneys or bladder?: No Painful intercourse?: No Weak stream?: No Currently pregnant?: No Vaginal bleeding?: No Last menstrual period?: n  Gastrointestinal Nausea?: No Vomiting?: No Indigestion/heartburn?: No Diarrhea?: No Constipation?: No  Constitutional Fever: No Night sweats?: No Weight loss?: No Fatigue?: No  Skin Skin rash/lesions?: No Itching?: No  Eyes Blurred vision?: No Double vision?: No  Ears/Nose/Throat Sore throat?: No Sinus problems?: No  Hematologic/Lymphatic Swollen glands?: No Easy bruising?: Yes  Cardiovascular Leg swelling?: No Chest pain?: No  Respiratory Cough?: No Shortness of breath?: No  Endocrine Excessive thirst?: Yes  Musculoskeletal Back pain?: No Joint pain?: No  Neurological Headaches?: No Dizziness?: Yes  Psychologic Depression?: No Anxiety?: No  Physical Exam: BP (!) 171/71   Pulse 68   Ht 5\' 5"  (1.651 m)   Wt 152 lb 6.4 oz (69.1 kg)   BMI 25.36 kg/m   Constitutional: Well nourished. Alert and oriented, No acute distress. HEENT: [place] AT, moist mucus membranes. Trachea midline, no masses. Cardiovascular: No clubbing, cyanosis, or edema. Respiratory: Normal respiratory effort, no increased work of breathing. GI: Abdomen is soft, non tender, non distended, no abdominal masses. Liver and spleen not palpable.  No hernias appreciated.  Stool sample for occult testing is not indicated.   GU: No CVA tenderness.  No bladder fullness or masses.  Atrophic external genitalia, normal pubic hair distribution, no lesions.  Normal urethral meatus, no lesions, no  prolapse, no discharge.   No urethral masses, tenderness and/or tenderness. No bladder fullness, tenderness or masses. Pale vagina mucosa, poor estrogen effect, no discharge, no lesions, good pelvic support, no cystocele or rectocele noted.  Pessary is in place.  Cervix and uterus are surgically absent.  No adnexal/parametria masses or tenderness noted.  Anus and perineum are without rashes or lesions.    Skin: No rashes, bruises or suspicious lesions. Lymph: No cervical or inguinal adenopathy. Neurologic: Grossly intact, no focal deficits, moving all 4 extremities. Psychiatric: Normal  mood and affect.  Laboratory Data: Lab Results  Component Value Date   WBC 8.3 06/22/2016   HGB 10.3 (L) 06/22/2016   HCT 31.1 (L) 06/22/2016   MCV 96.3 06/22/2016   PLT 246 06/22/2016    Lab Results  Component Value Date   CREATININE 1.45 (H) 06/22/2016    Lab Results  Component Value Date   HGBA1C 7.6 (H) 05/13/2012    Lab Results  Component Value Date   TSH 2.546 05/19/2012       Component Value Date/Time   CHOL 119 07/08/2013 0739   HDL 58 07/08/2013 0739   VLDL 22 07/08/2013 0739   LDLCALC 39 07/08/2013 0739    Lab Results  Component Value Date   AST 21 06/22/2016   Lab Results  Component Value Date   ALT 20 06/22/2016     Urinalysis > 30 WBC's.  > 30 RBC's.  See EPIC.    Assessment & Plan:    1. Bladder wall irregularity  - seen on CT in 2013  - cystoscopy performed in 2015 showed no abnormality  - no gross hematuria  - abnormality is present on recent RUS, schedule cystoscopy - has not had a cystoscopy as of yet due continued positive infections   2. Recurrent UTI's  - reviewed UTI prevention strategies  - start gentamicin bladder instillations in an effort to stave off UTI  - 1 st installation given today   3. Incontinence  - resume PTNS, once bladder abnormality is evaluated  4. Renal cyst  - left renal cyst slightly larger than on 2013 study                             Return for RTC Friday on nurses scheduld for bladder gentamicin instillation.  These notes generated with voice recognition software. I apologize for typographical errors.  Michiel Cowboy, PA-C  [facility] [address] [place], [place] [phone]

## 2017-01-06 NOTE — Progress Notes (Signed)
Bladder Irrigation  Due to recurrent UTI patient is present today for a Gentamicin instillation. Patient was cleaned and prepped in a sterile fashion. 52ml of saline/gent was instilled and irrigated into the bladder with a 60ml Toomey syringe through the catheter in place.  After irrigation urine flow was noted no complications were noted catheter is now draining fine.  Balloon was drained and catheter was removed after 30 minutes. Patient tolerated well.   Preformed by: Teressa Lowerarrie Marshal Schrecengost, CMA Additional notes/ Follow up: Friday for Gentamicin instillation #2

## 2017-01-08 ENCOUNTER — Ambulatory Visit (INDEPENDENT_AMBULATORY_CARE_PROVIDER_SITE_OTHER): Payer: Medicare Other | Admitting: Family Medicine

## 2017-01-08 DIAGNOSIS — N390 Urinary tract infection, site not specified: Secondary | ICD-10-CM | POA: Diagnosis not present

## 2017-01-08 MED ORDER — GENTAMICIN SULFATE 40 MG/ML IJ SOLN
80.0000 mg | Freq: Once | INTRAMUSCULAR | Status: AC
  Administered 2017-01-08: 80 mg via INTRAMUSCULAR

## 2017-01-08 NOTE — Progress Notes (Signed)
Gentamicin instillation  Due to recurrent UTI patient is present today for a bladder irrigation. Patient was cleaned and prepped in a sterile fashion. 50 ml of saline/Gentamicin was instilled and irrigated into the bladder with a 60ml Toomey syringe through the catheter in place.  After irrigation urine flow was noted no complications were noted catheter is now draining fine.  A plug was placed on catheter for 30 min. And the balloon was then drained Patient tolerated well.   Preformed by: Teressa Lowerarrie Luie Laneve, CMA Additional notes/ Follow up: [facility]

## 2017-01-11 ENCOUNTER — Ambulatory Visit: Payer: Medicare Other

## 2017-01-12 ENCOUNTER — Ambulatory Visit (INDEPENDENT_AMBULATORY_CARE_PROVIDER_SITE_OTHER): Payer: Medicare Other

## 2017-01-12 VITALS — BP 143/74 | HR 66 | Ht 65.0 in | Wt 153.7 lb

## 2017-01-12 DIAGNOSIS — N390 Urinary tract infection, site not specified: Secondary | ICD-10-CM

## 2017-01-12 MED ORDER — GENTAMICIN SULFATE 40 MG/ML IJ SOLN
80.0000 mg | Freq: Once | INTRAMUSCULAR | Status: AC
  Administered 2017-01-12: 80 mg via INTRAPERITONEAL

## 2017-01-12 NOTE — Progress Notes (Signed)
Bladder Gentamycin Instillation  Due to recurrent UTI patient is present today for a Gentamycin Treatment.  Patient was cleaned and prepped in a sterile fashion with betadine and lidocaine 2% jelly was instilled into the urethra.  A 16 FR catheter was inserted, urine return was noted 200ml, urine was cloudy and yellow in color.  Instilled a solution consisting of 2mL of gentamycin and 50mL of sodium bicarb. The catheter was then plugged for 30min. Patient tolerated well, no complications were noted.   Performed by: Rupert Stackshelsea Watkins, LPN  Blood pressure (!) 143/74, pulse 66, height 5\' 5"  (1.651 m), weight 153 lb 11.2 oz (69.7 kg).

## 2017-01-25 ENCOUNTER — Telehealth: Payer: Self-pay

## 2017-01-25 DIAGNOSIS — N390 Urinary tract infection, site not specified: Secondary | ICD-10-CM

## 2017-01-25 NOTE — Telephone Encounter (Signed)
Would Dr. Sampson GoonFitzgerald be able to help us get her urine clear so that she can have her cystoscopy?

## 2017-01-25 NOTE — Telephone Encounter (Signed)
Pt called stating she does not think the gentamycin installations are helping. Pt stated that her s/s are not any better. Pt stated that she feels like s/s may even be worse. 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 Please advise.

## 2017-01-26 ENCOUNTER — Ambulatory Visit (INDEPENDENT_AMBULATORY_CARE_PROVIDER_SITE_OTHER): Payer: Medicare Other | Admitting: Obstetrics & Gynecology

## 2017-01-26 VITALS — BP 140/70 | HR 89 | Wt 156.0 lb

## 2017-01-26 DIAGNOSIS — N952 Postmenopausal atrophic vaginitis: Secondary | ICD-10-CM | POA: Diagnosis not present

## 2017-01-26 DIAGNOSIS — N761 Subacute and chronic vaginitis: Secondary | ICD-10-CM | POA: Diagnosis not present

## 2017-01-26 DIAGNOSIS — N8184 Pelvic muscle wasting: Secondary | ICD-10-CM | POA: Diagnosis not present

## 2017-01-26 DIAGNOSIS — N8111 Cystocele, midline: Secondary | ICD-10-CM | POA: Diagnosis not present

## 2017-01-26 MED ORDER — ESTRADIOL 2 MG VA RING: 2.0000 mg | VAGINAL_RING | VAGINAL | 12 refills | Status: DC

## 2017-01-26 NOTE — Progress Notes (Addendum)
HPI:      Ms. Pamela [name] is a 83 y.o. G1P1001 who presents today for her pessary follow up and examination related to her pelvic floor weakening.  Pt reports tolerating the pessary well with  no vaginal bleeding and  no vaginal discharge in the past; has been on pessary holiday for past 3 mos due to vag dryness and irritation (w Estring for tx of vag atrophy started at that time).  Symptoms of pelvic floor weakening have worsened, and she has also had freq UTI and vaginal infections w many ABX treatments. She is voiding and defecating without difficulty. She has had a ring w support #3 pessary.  PMHx: The following portions of the patient's history were reviewed and updated as appropriate:           She  has a past medical history of A-fib (HCC) (08/28/2014); Abnormal gait (02/08/2012); Abscess or cellulitis of leg (04/18/2012); Absence of bladder continence (07/29/2012); Acid reflux (02/08/2012); Acute kidney failure (HCC) (05/15/2012); Afib, paroxysmal, with RVR at times with BBB (05/13/2012); Allergic drug rash (05/15/2012); Anemia (05/13/2012); Appendicular ataxia (05/08/2015); Benign essential HTN (02/08/2012); Bladder infection, chronic (06/29/2013); BP (high blood pressure) (05/12/2012); Bursitis of knee (08/07/2014); Cellulitis in diabetic foot (HCC) (05/12/2012); Chronic kidney disease (06/01/2012); Clinical depression (08/28/2014); CN (constipation) (03/17/2012); Dermatitis due to drug reaction (05/15/2012); Diabetes mellitus; Diastolic dysfunction, left ventricle (05/14/2012); DM (diabetes mellitus) (HCC) (05/12/2012); Dysrhythmia; Edema leg (07/02/2014); Enthesopathy of knee (08/07/2014); Excess fluid volume (05/20/2012); FOM (frequency of micturition) (07/29/2012); Homero FellersFrank hematuria (09/27/2014); Gangrene (HCC); High cholesterol; HTN (hypertension) (05/12/2012); Hypercholesterolemia (05/12/2012); Hypertension; Incomplete bladder emptying (07/29/2012); Open wound of knee, leg (except thigh), and ankle (06/01/2012); and  Stevens-Johnson syndrome (HCC) (05/15/2012). She  has a past surgical history that includes Toe amputation; Tonsillectomy; Abdominal hysterectomy; cataract surgery; and Endovenous ablation saphenous vein w/ laser. Her family history includes Diabetes in her maternal grandmother and paternal grandmother; Diabetes Mellitus II in her mother; Heart disease in her mother; Lung cancer in her father. She  reports that she has never smoked. She has never used smokeless tobacco. She reports that she does not drink alcohol or use drugs.  Review of Systems  Constitutional: Negative for chills, fever and malaise/fatigue.  HENT: Negative for congestion, sinus pain and sore throat.   Eyes: Negative for blurred vision and pain.  Respiratory: Negative for cough and wheezing.   Cardiovascular: Negative for chest pain and leg swelling.  Gastrointestinal: Negative for abdominal pain, constipation, diarrhea, heartburn, nausea and vomiting.  Genitourinary: Negative for dysuria, frequency, hematuria and urgency.  Musculoskeletal: Negative for back pain, joint pain, myalgias and neck pain.  Skin: Negative for itching and rash.  Neurological: Negative for dizziness, tremors and weakness.  Endo/Heme/Allergies: Does not bruise/bleed easily.  Psychiatric/Behavioral: Negative for depression. The patient is not nervous/anxious and does not have insomnia.     Physical Exam  Constitutional: She is oriented to person, place, and time. She appears well-developed and well-nourished. No distress.  Genitourinary: Rectum normal and vagina normal. Pelvic exam was performed with patient supine. There is no rash or lesion on the right labia. There is no rash or lesion on the left labia. Vagina exhibits no lesion. No bleeding in the vagina.  Genitourinary Comments: Uterus and cervix absent.  Cardiovascular: Normal rate.   Pulmonary/Chest: Effort normal.  Abdominal: Soft. Bowel sounds are normal. She exhibits no distension. There is  no tenderness. There is no rebound.  Musculoskeletal: Normal range of motion.  Neurological: She is alert and oriented to  person, place, and time.  Skin: Skin is warm and dry.  Psychiatric: She has a normal mood and affect.  Vitals reviewed.   Pessary Care Vagina checked - without erosions.  A/P: Pt to fill Estring Rx and bring pessary she has back to next appt for replacement. Culture today for any other vaginitis etiology. (one swab)

## 2017-01-26 NOTE — Telephone Encounter (Signed)
Will make referral. Pt is currently a Pamela [name] pt as PCP.

## 2017-01-28 ENCOUNTER — Encounter: Payer: Self-pay | Admitting: Obstetrics & Gynecology

## 2017-01-28 IMAGING — CR DG KNEE COMPLETE 4+V*R*
1 series · 4 of 4 positions shown · non-contrast
Comparison: None.

CLINICAL DATA: Acute right knee pain after fall 2 days ago. Initial
encounter.

EXAM:
RIGHT KNEE - COMPLETE 4+ VIEW

[Series 1: dxr knee rt comp with obliques · 0.14mm/px · 4 of 4 slices shown]
[im 1/4]
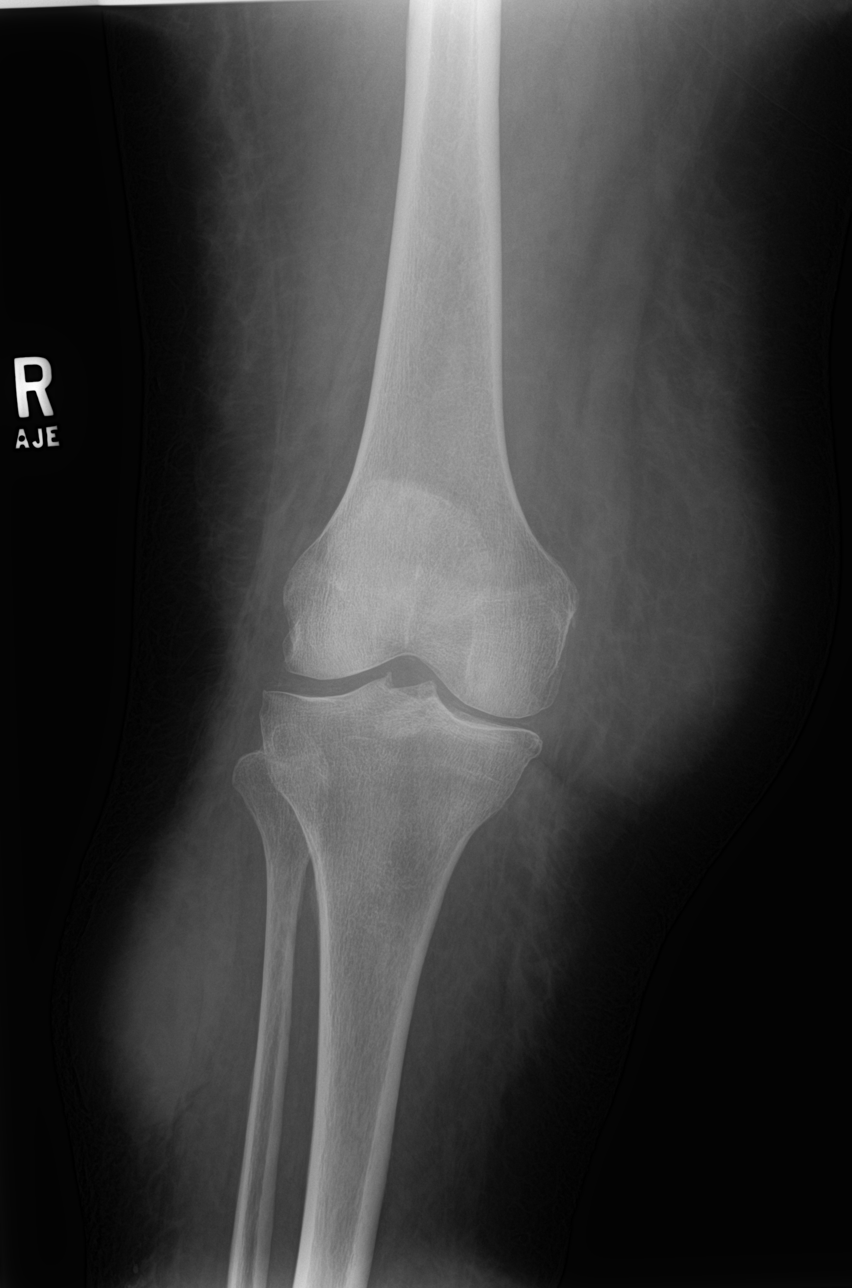
[im 2/4]
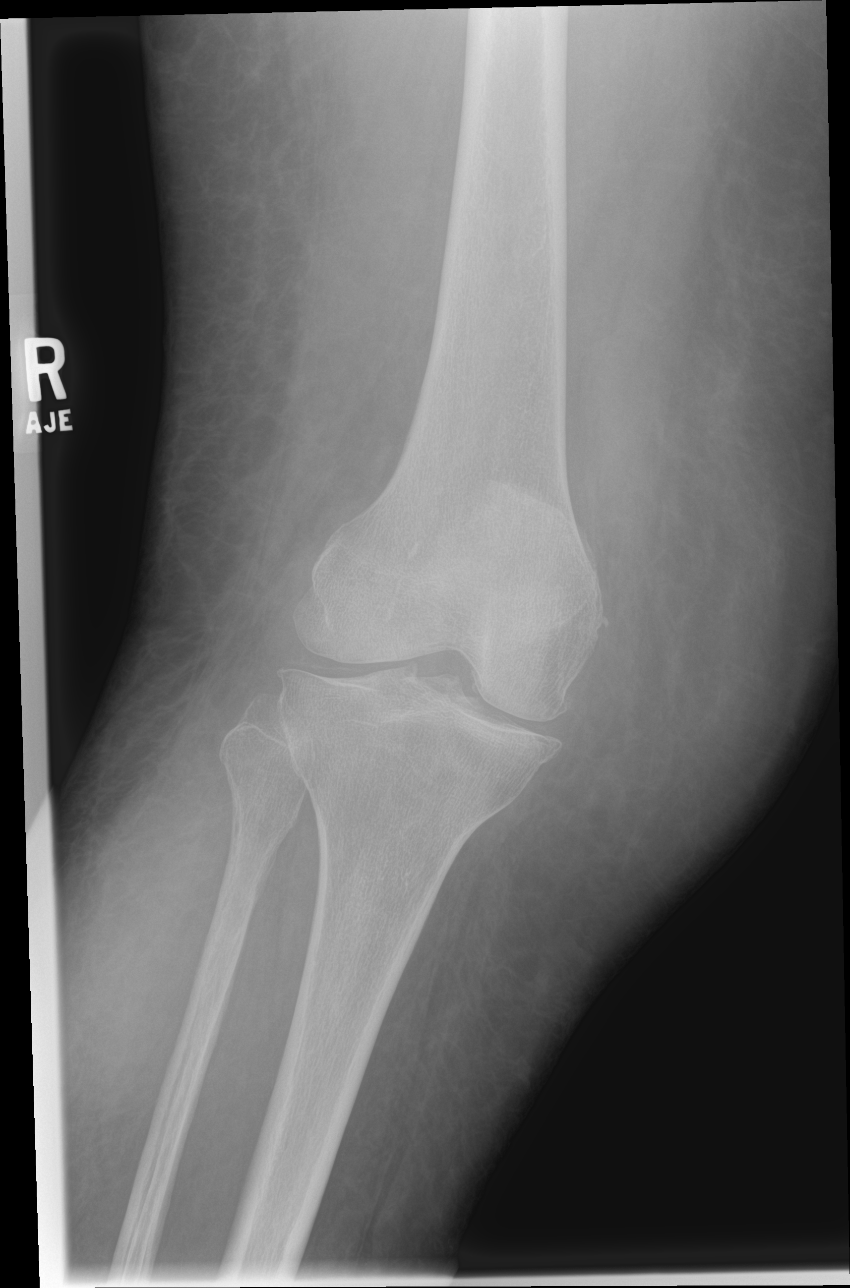
[im 3/4]
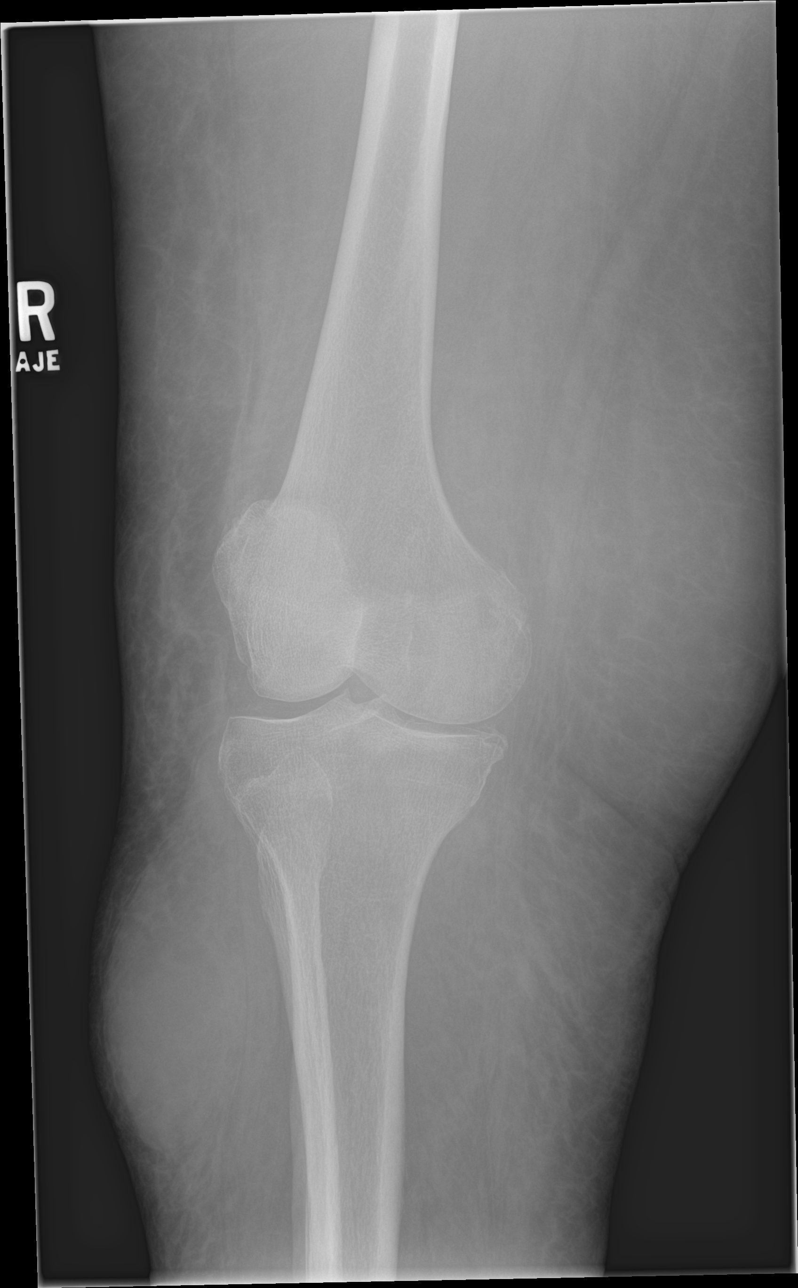
[im 4/4]
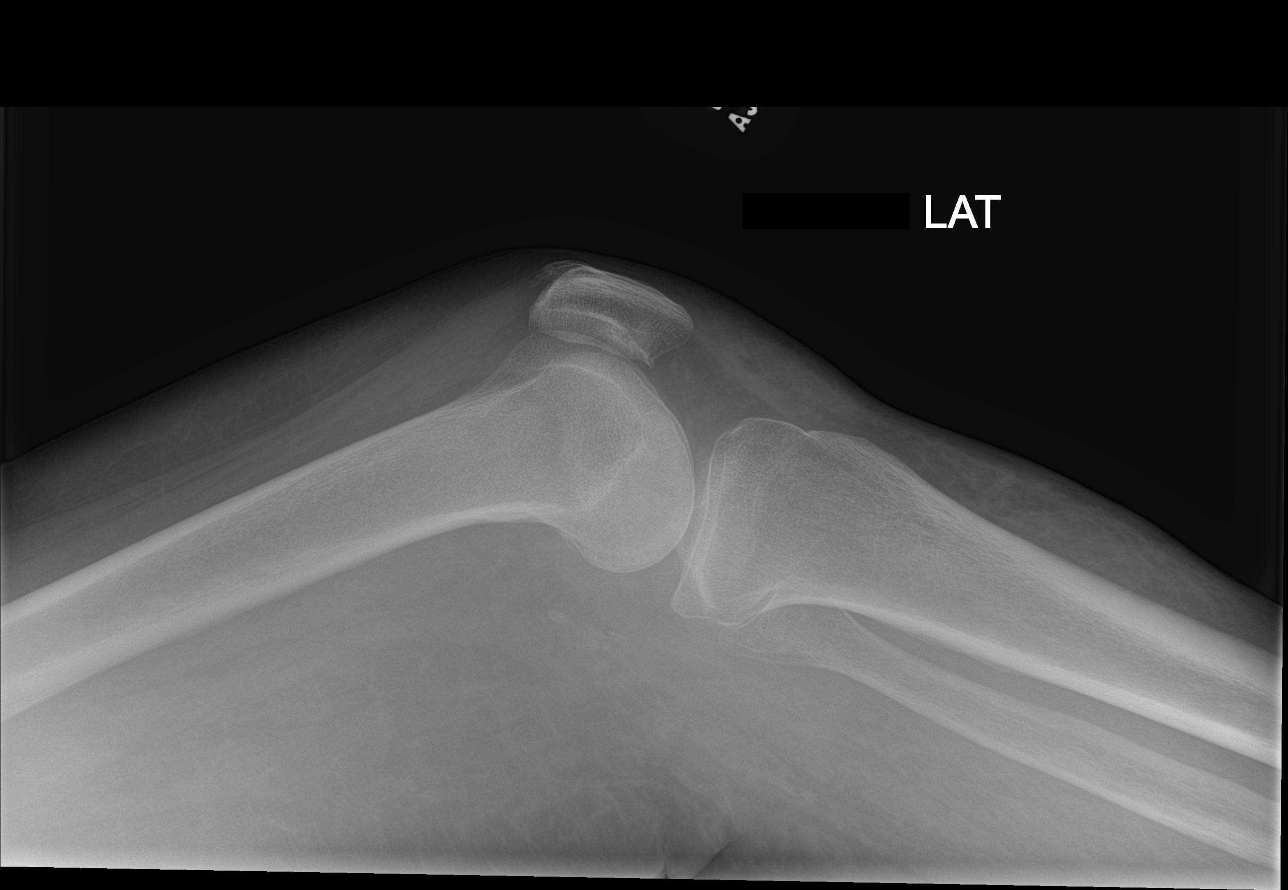

[4 of 4 positions shown; findings below may reference images not displayed]

FINDINGS: There is no evidence of fracture, dislocation, or joint effusion.
Spurring of superior aspect of patella is noted. Moderate narrowing
of medial joint space is noted. Soft tissues are unremarkable.
IMPRESSION: Moderate degenerative joint disease is noted medially. No acute
abnormality seen in the right knee.

## 2017-01-28 IMAGING — CR RIGHT ELBOW - COMPLETE 3+ VIEW
1 series · 4 of 4 positions shown · non-contrast
Comparison: None.

CLINICAL DATA: Acute right elbow pain after fall 2 days ago.
Initial encounter.

EXAM:
RIGHT ELBOW - COMPLETE 3+ VIEW

[Series 1: dxr elbow rt comp w/obliques · 0.14mm/px · 4 of 4 slices shown]
[im 1/4]
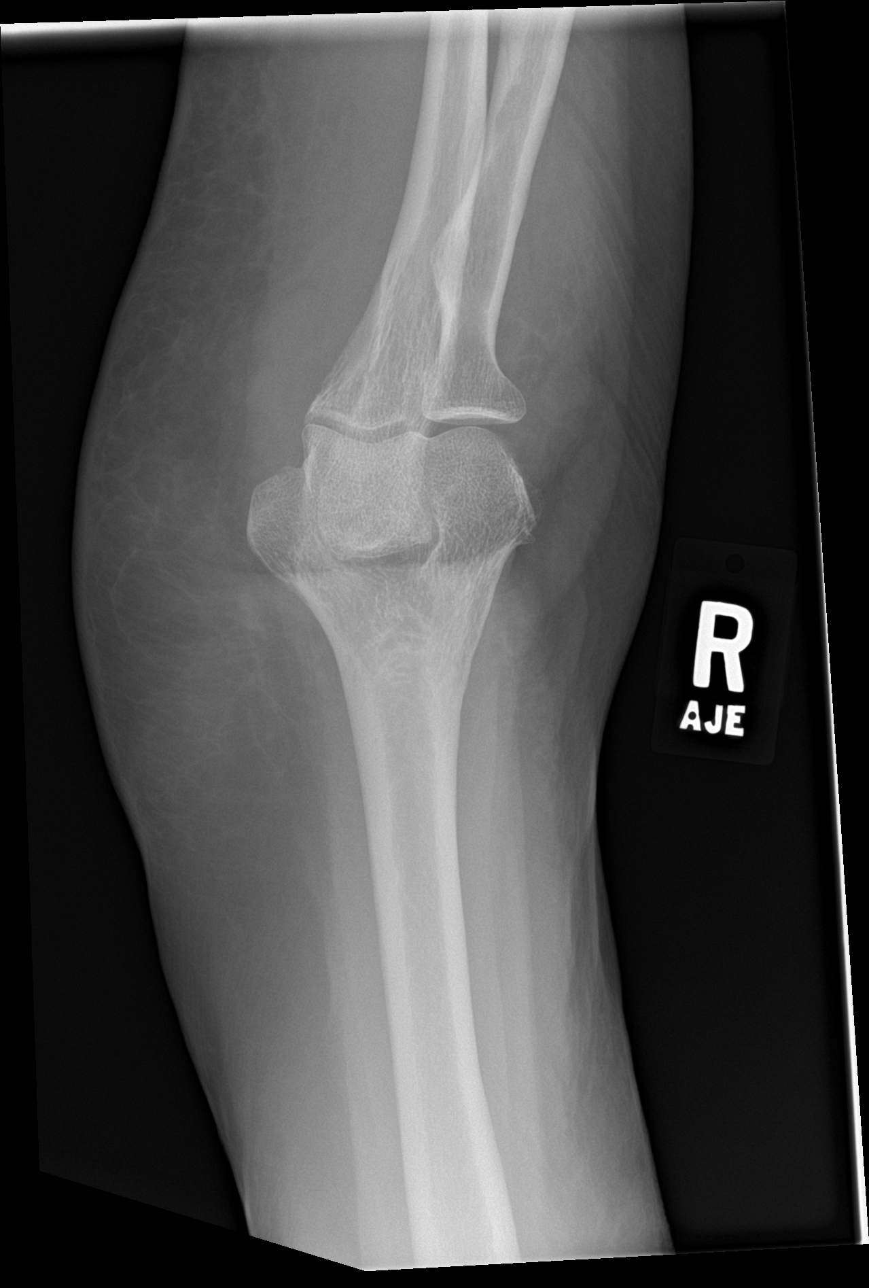
[im 2/4]
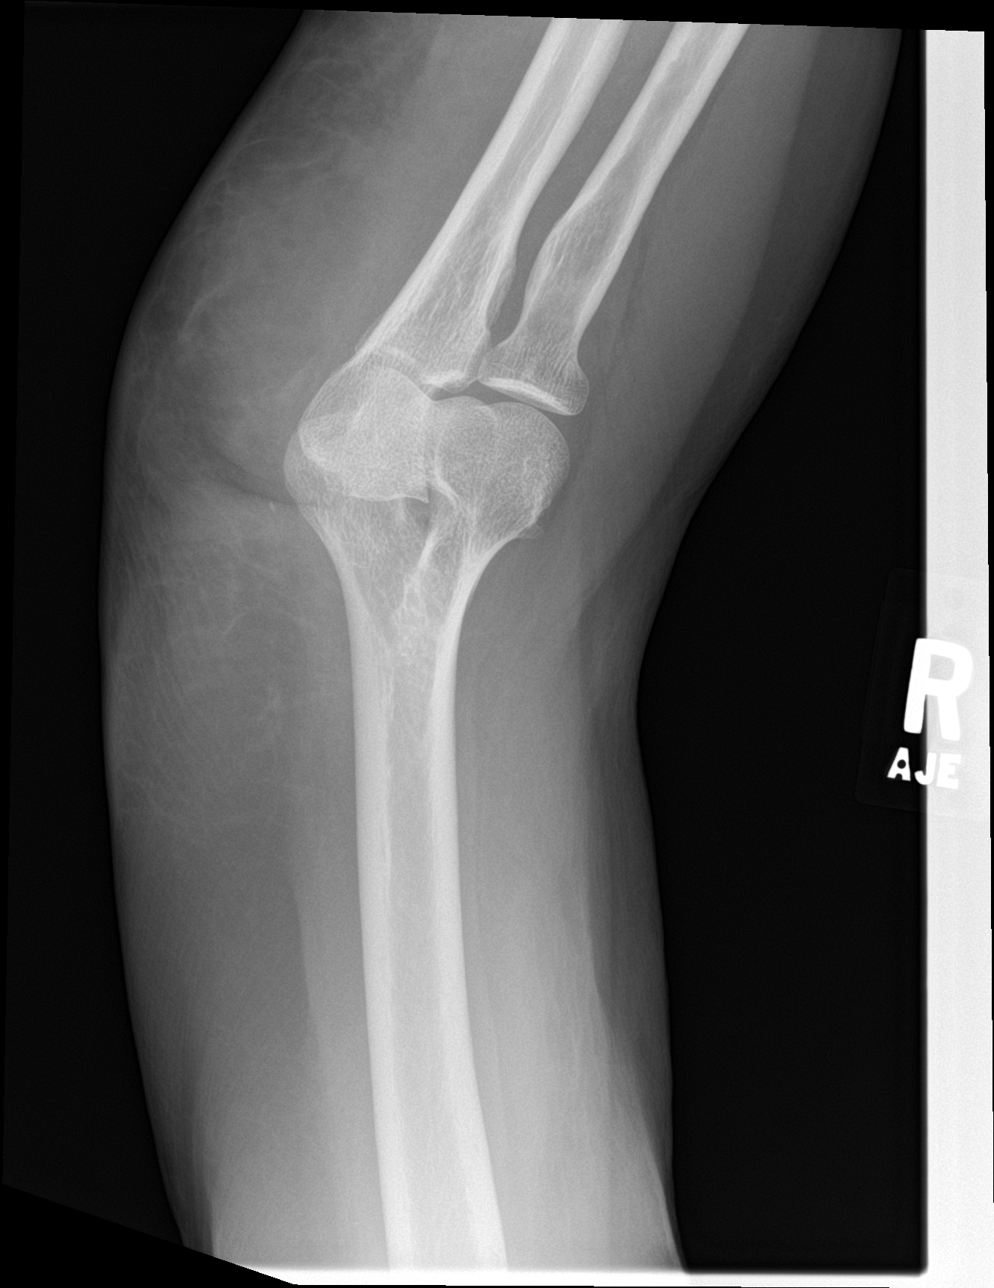
[im 3/4]
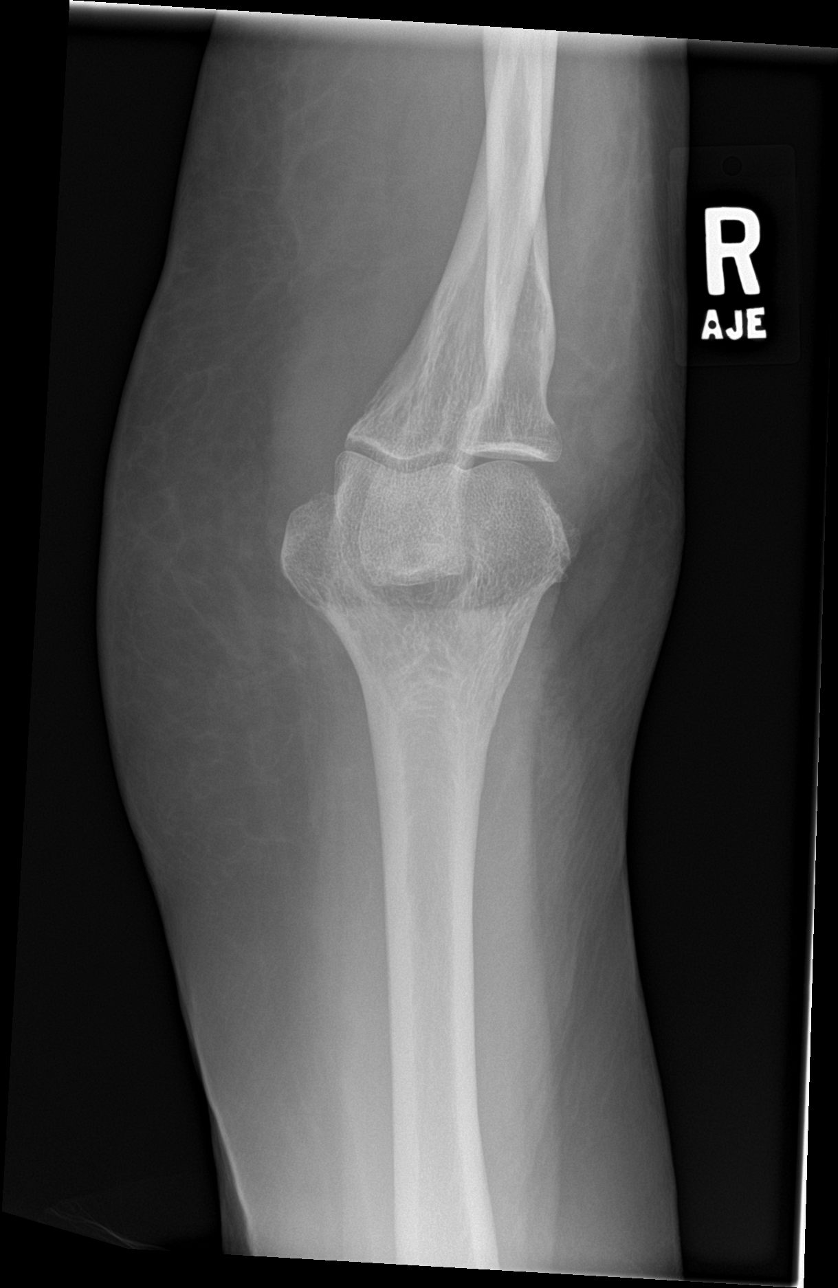
[im 4/4]
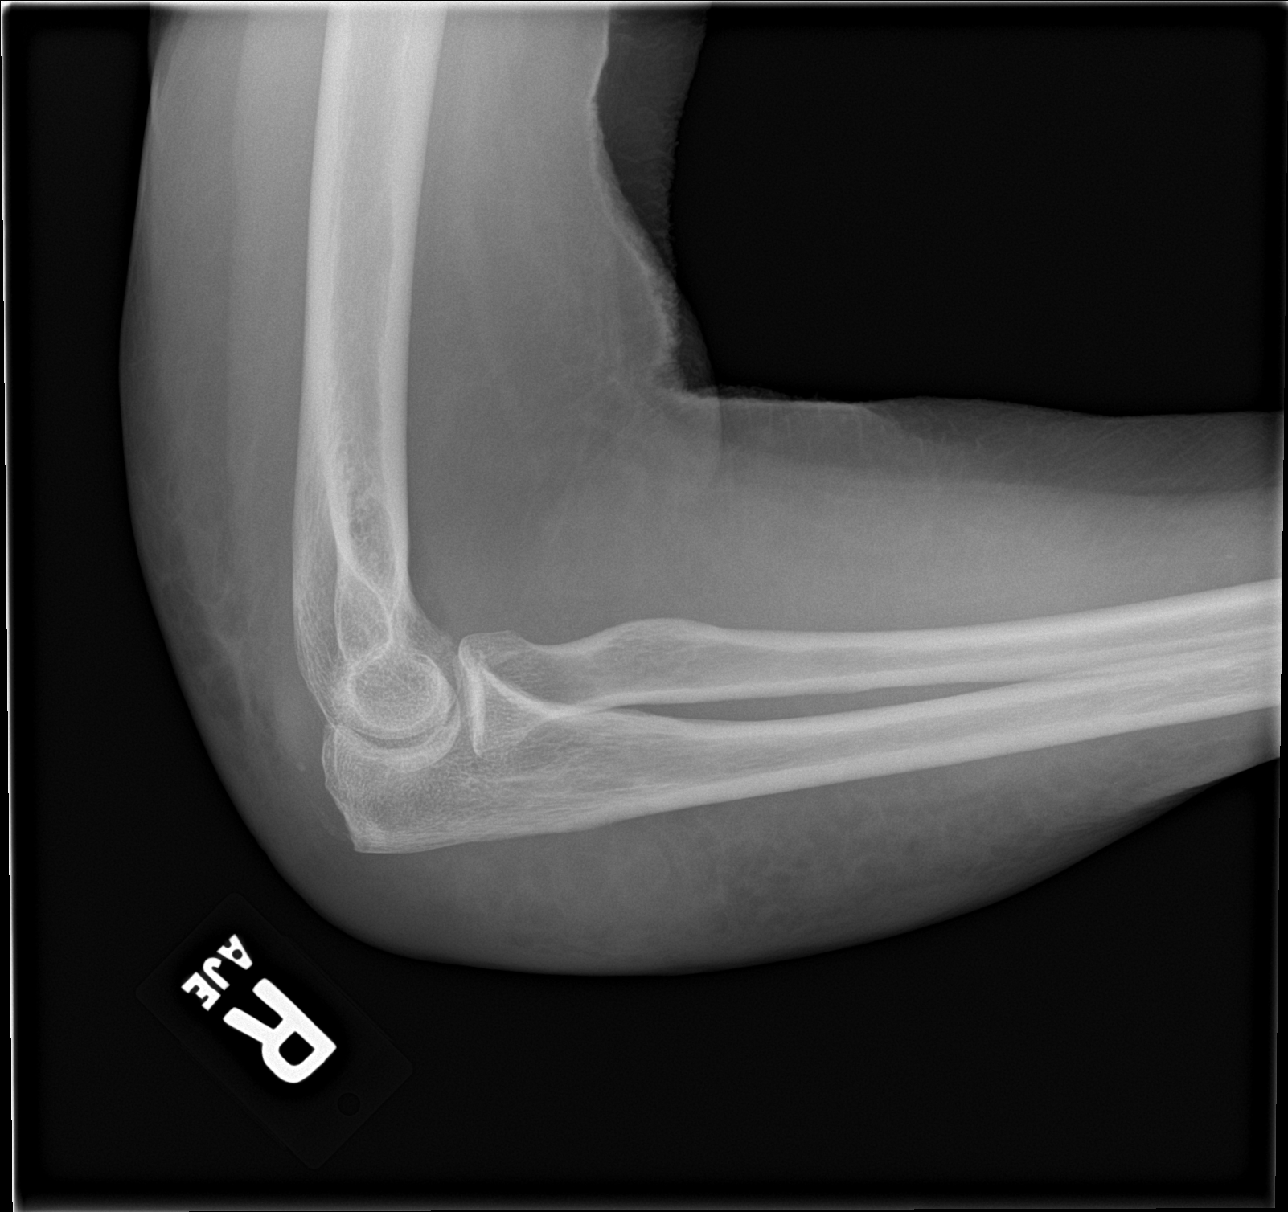

[4 of 4 positions shown; findings below may reference images not displayed]

FINDINGS: There is no evidence of fracture, dislocation, or joint effusion.
There is no evidence of arthropathy or other focal bone abnormality.
Soft tissues are unremarkable.
IMPRESSION: Normal right elbow.

## 2017-01-28 IMAGING — CR DG SHOULDER 3+V*R*
1 series · 3 of 3 positions shown · non-contrast
Comparison: None.

CLINICAL DATA: Fall 2 days ago onto right side with pain in the
right shoulder. Initial encounter.

EXAM:
DG SHOULDER 3+ VIEWS RIGHT

[Series 1: dxr shoulder right complete · 0.14mm/px · 3 of 3 slices shown]
[im 1/3]
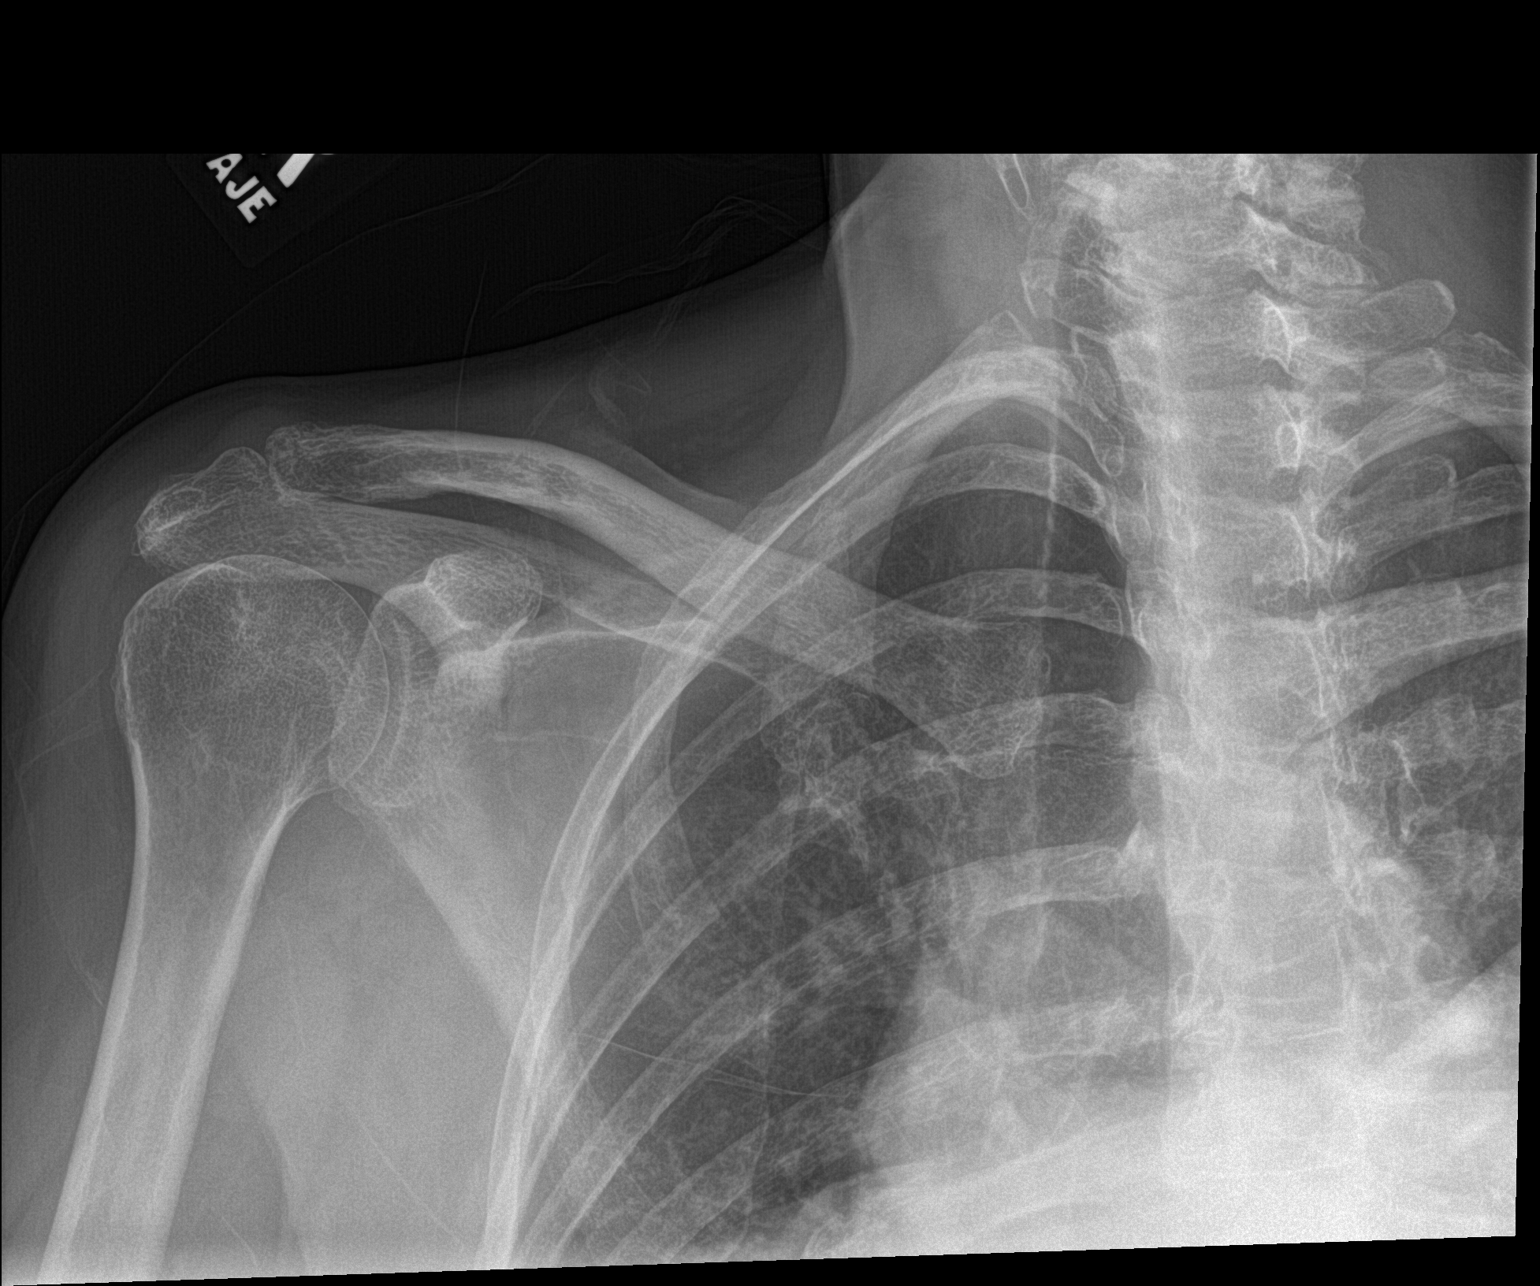
[im 2/3]
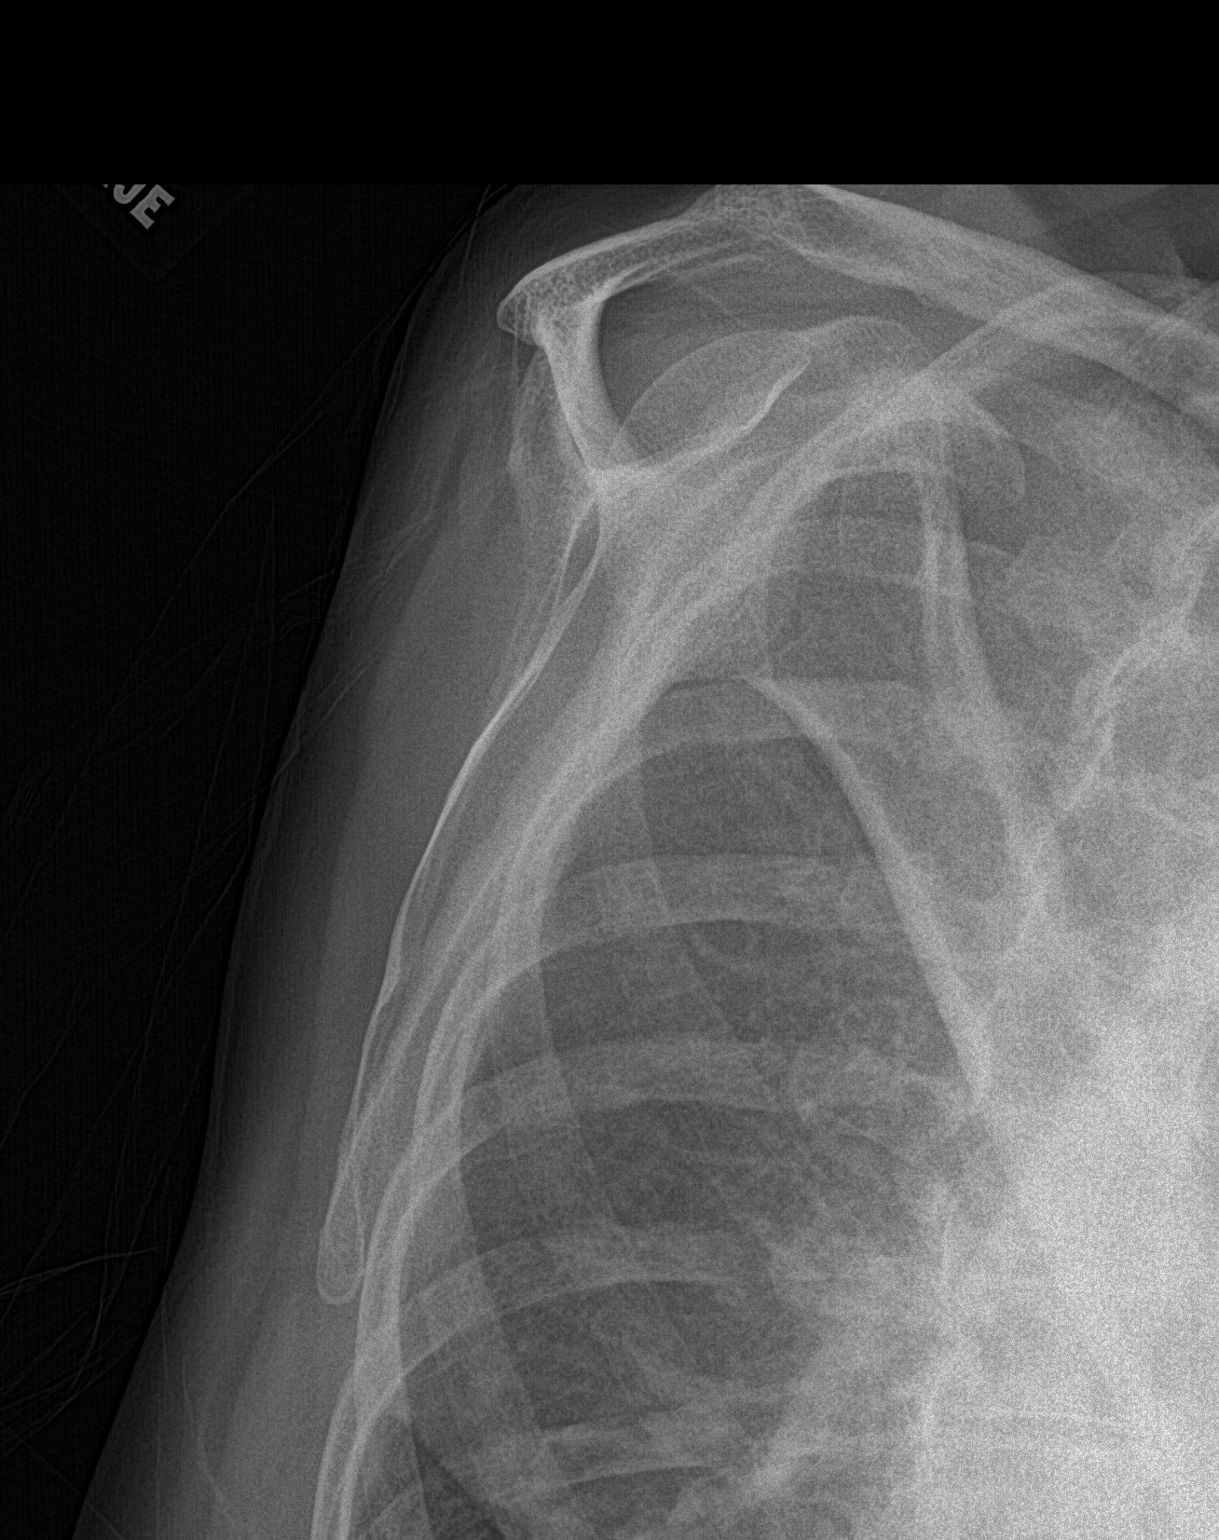
[im 3/3]
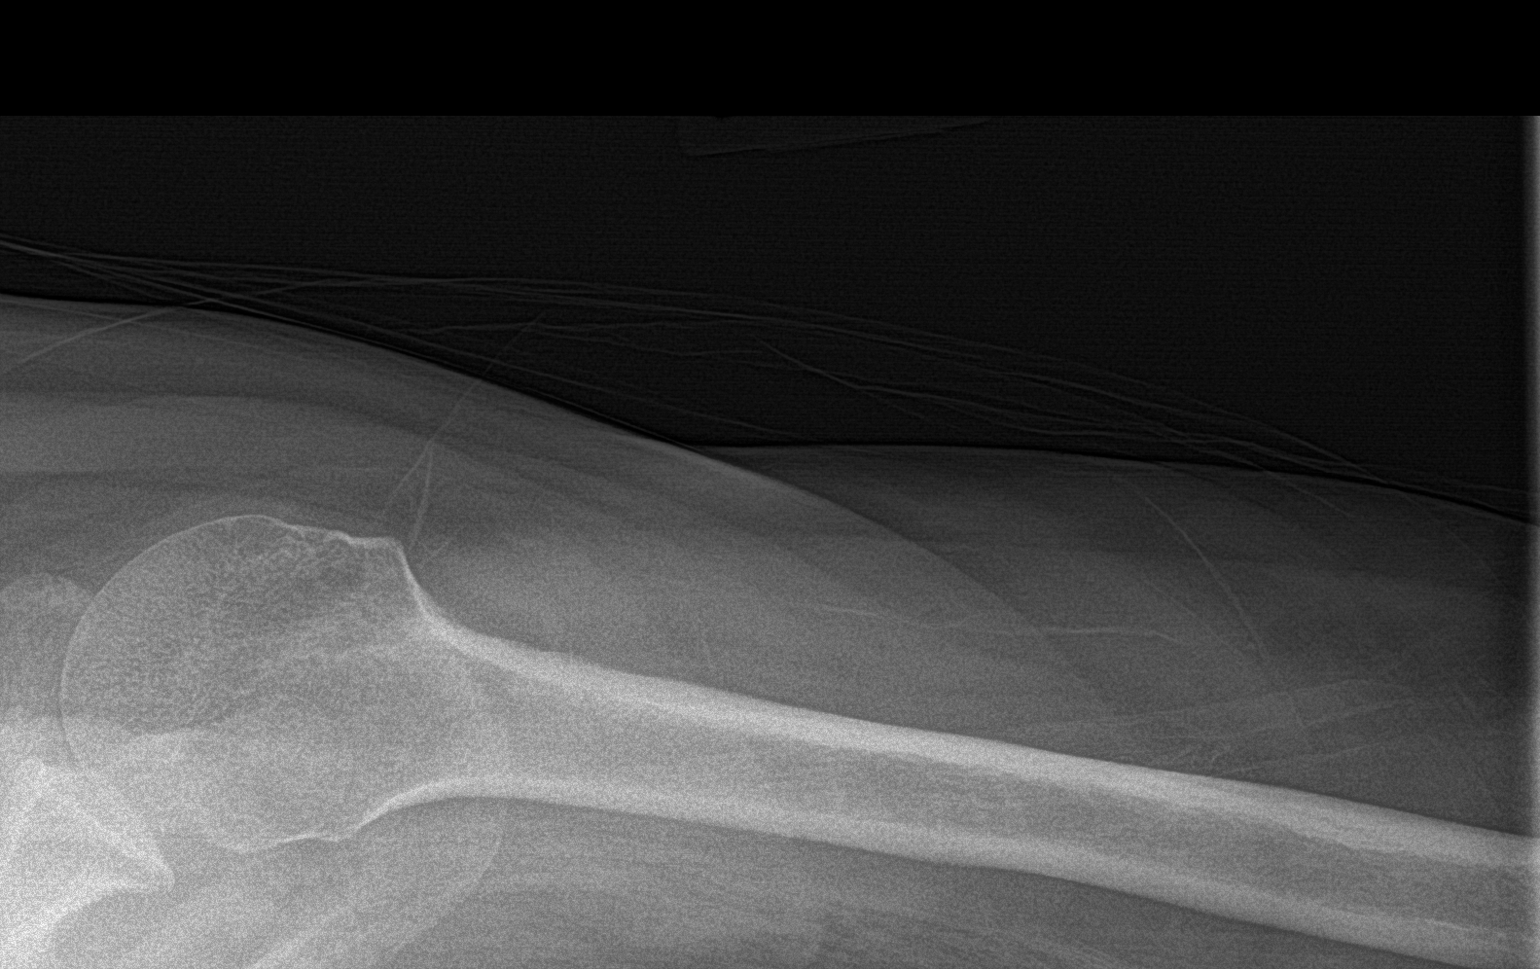

[3 of 3 positions shown; findings below may reference images not displayed]

FINDINGS: There is no evidence of fracture or dislocation. No significant
degenerative change. No evidence of right chest wall trauma.
IMPRESSION: Negative.

## 2017-02-10 ENCOUNTER — Ambulatory Visit: Payer: Medicare Other | Admitting: Obstetrics & Gynecology

## 2017-02-19 ENCOUNTER — Ambulatory Visit (INDEPENDENT_AMBULATORY_CARE_PROVIDER_SITE_OTHER): Payer: Medicare Other

## 2017-02-19 VITALS — BP 128/65 | HR 53 | Ht 65.0 in | Wt 150.7 lb

## 2017-02-19 DIAGNOSIS — N390 Urinary tract infection, site not specified: Secondary | ICD-10-CM | POA: Diagnosis not present

## 2017-02-19 LAB — URINALYSIS, COMPLETE
BILIRUBIN UA: NEGATIVE
Glucose, UA: NEGATIVE
NITRITE UA: NEGATIVE
PH UA: 7 (ref 5.0–7.5)
Specific Gravity, UA: 1.02 (ref 1.005–1.030)
UUROB: 0.2 mg/dL (ref 0.2–1.0)

## 2017-02-19 LAB — MICROSCOPIC EXAMINATION

## 2017-02-19 NOTE — Progress Notes (Signed)
Pt presents today with c/o dysuria, itching, lower abd pain, and foul smelling urine. A CATH specimen was obtained for u/a and cx.   Blood pressure 128/65, pulse (!) 53, height 5\' 5"  (1.651 m), weight 150 lb 11.2 oz (68.4 kg).

## 2017-02-23 LAB — CULTURE, URINE COMPREHENSIVE

## 2017-02-24 ENCOUNTER — Telehealth: Payer: Self-pay

## 2017-02-24 NOTE — Telephone Encounter (Signed)
-----   Message from Harle BattiestShannon A McGowan, PA-C sent at 02/23/2017  2:55 PM EDT ----- Please ask Gearldine BienenstockBrandy to get Dr. Jarrett AblesFitzgerald's recommendations on Mrs. Corey's recurrent UTI's.

## 2017-02-24 NOTE — Telephone Encounter (Signed)
Left message on Brandy's vm.

## 2017-02-25 NOTE — Telephone Encounter (Signed)
Spoke with Hor[facility]Brandy who stated sensitivities need to be added to ucx. [facility] Sampson GoonFitzgerald next week.  Spoke with [facility] and sensitivities were added.

## 2017-02-27 LAB — SUSCEPTIBILITY, AER + ANAEROB

## 2017-03-01 ENCOUNTER — Telehealth: Payer: Self-pay | Admitting: Urology

## 2017-03-01 NOTE — Telephone Encounter (Signed)
Call Brandy at [facility] office concerning patient.

## 2017-03-02 ENCOUNTER — Other Ambulatory Visit: Payer: Self-pay | Admitting: Infectious Diseases

## 2017-03-02 DIAGNOSIS — R350 Frequency of micturition: Secondary | ICD-10-CM

## 2017-03-02 DIAGNOSIS — R339 Retention of urine, unspecified: Secondary | ICD-10-CM

## 2017-03-02 DIAGNOSIS — R310 Gross hematuria: Secondary | ICD-10-CM

## 2017-03-02 DIAGNOSIS — N390 Urinary tract infection, site not specified: Secondary | ICD-10-CM

## 2017-03-02 NOTE — Telephone Encounter (Signed)
Pamela [name] spoke with [name] and pt has been taken care of.

## 2017-03-04 LAB — ID 4

## 2017-03-05 LAB — ID 3

## 2017-03-06 LAB — ID 2

## 2017-03-06 LAB — SPECIMEN STATUS REPORT

## 2017-03-06 LAB — ID AND SUSCEPTIBILITY, URINE

## 2017-03-08 ENCOUNTER — Telehealth: Payer: Self-pay

## 2017-03-08 NOTE — Telephone Encounter (Signed)
LMOM- for Pamela [name].

## 2017-03-08 NOTE — Telephone Encounter (Signed)
-----   Message from Harle Battiest, PA-C sent at 03/08/2017  8:18 AM EDT ----- Elnoria Howard you please forward these results onto Dr. Jarrett Ables office to get his recommendation on what antibiotic wish to place the patient on?

## 2017-03-09 NOTE — Telephone Encounter (Signed)
Spoke with [name] who stated she will pull results and send Dr. Sampson Goon a message. [name] will call back when she hears from Dr. Sampson Goon.

## 2017-03-10 ENCOUNTER — Ambulatory Visit
Admission: RE | Admit: 2017-03-10 | Discharge: 2017-03-10 | Disposition: A | Discharge status: Home / Self Care | Payer: Medicare Other | Source: Ambulatory Visit | Attending: Infectious Diseases | Admitting: Infectious Diseases

## 2017-03-10 DIAGNOSIS — R339 Retention of urine, unspecified: Secondary | ICD-10-CM

## 2017-03-10 DIAGNOSIS — R310 Gross hematuria: Secondary | ICD-10-CM

## 2017-03-10 DIAGNOSIS — N390 Urinary tract infection, site not specified: Secondary | ICD-10-CM

## 2017-03-10 DIAGNOSIS — N323 Diverticulum of bladder: Secondary | ICD-10-CM | POA: Insufficient documentation

## 2017-03-10 DIAGNOSIS — Z90710 Acquired absence of both cervix and uterus: Secondary | ICD-10-CM | POA: Diagnosis not present

## 2017-03-10 DIAGNOSIS — R350 Frequency of micturition: Secondary | ICD-10-CM

## 2017-03-10 DIAGNOSIS — K5900 Constipation, unspecified: Secondary | ICD-10-CM | POA: Insufficient documentation

## 2017-03-10 DIAGNOSIS — N289 Disorder of kidney and ureter, unspecified: Secondary | ICD-10-CM | POA: Diagnosis not present

## 2017-03-10 DIAGNOSIS — I722 Aneurysm of renal artery: Secondary | ICD-10-CM | POA: Diagnosis not present

## 2017-03-10 DIAGNOSIS — R390 Extravasation of urine: Secondary | ICD-10-CM | POA: Diagnosis present

## 2017-03-10 NOTE — Telephone Encounter (Signed)
Yes.  Schedule the cystoscopy.

## 2017-03-10 NOTE — Telephone Encounter (Signed)
Brandy [address], LPN        [facility], I copied DPF's response below. Please  call me if you have any questions. Do not respond  to this message. I do not check messages in the  [facility] EPIC system. Thanks!   Dierdre Harness, MD 58 minutes ago (2:39  PM)  I reviewed data and spoke to patient. Urinalysis  had greater than 30 white cells. Urine culture  grew greater than 100,000 enterococcus as well as  Proteus again. They were both sensitive to  ciprofloxacin, levofloxacin, penicillin, and  amoxicillin. I discussed with her the Cipro  allergy listed in her records. She stated that  she is willing to try it again. Pt cannot recall  any severe allergy with it. She schedule her for  her CT scan tomorrow. I have asked her to keep  that appointment.    Sharlene Dory, can you let Urology know that i sent  in ciprofloxacin for her to try for 14 days. We  will get the CAT scan tomorrow but if they want  to plan on doing a cystoscopy it would need to be  towards the end of her antibiotic treatment.    This is what Dr. Sampson Goon recommended. Do you want me to schedule pt for cysto?

## 2017-03-10 NOTE — Telephone Encounter (Signed)
appt has been schd and the patient has been notified.  Pamela [name]

## 2017-03-16 ENCOUNTER — Encounter: Payer: Self-pay | Admitting: Urology

## 2017-03-16 ENCOUNTER — Ambulatory Visit (INDEPENDENT_AMBULATORY_CARE_PROVIDER_SITE_OTHER): Payer: Medicare Other | Admitting: Urology

## 2017-03-16 VITALS — BP 125/68 | HR 64 | Ht 65.0 in | Wt 155.5 lb

## 2017-03-16 DIAGNOSIS — N390 Urinary tract infection, site not specified: Secondary | ICD-10-CM

## 2017-03-16 DIAGNOSIS — N3289 Other specified disorders of bladder: Secondary | ICD-10-CM

## 2017-03-16 MED ORDER — GENTAMICIN SULFATE 40 MG/ML IJ SOLN
80.0000 mg | Freq: Once | INTRAMUSCULAR | Status: AC
  Administered 2017-03-16: 80 mg via INTRAMUSCULAR

## 2017-03-16 MED ORDER — LIDOCAINE HCL 2 % EX GEL
1.0000 "application " | Freq: Once | CUTANEOUS | Status: AC
  Administered 2017-03-16: 1 via URETHRAL

## 2017-03-16 MED ORDER — DOXYCYCLINE HYCLATE 100 MG PO TABS: 100.0000 mg | ORAL_TABLET | Freq: Once | ORAL | Status: DC

## 2017-03-16 NOTE — Progress Notes (Signed)
   03/16/17  CC: No chief complaint on file.   HPI: Presents for cysto for recurrent UTI and bladder wall thickening on CT. She's now on Cipro with Dr. Sampson Goon and has done fine. It still listed as an allergy. Her lower urinary tract symptoms are much improved. CT showed thickening right posterior bladder wall, posterior diverticula, a pessary.   There were no vitals taken for this visit. Chaperone was Lyla Son for the exam and cystoscopy. NED. A&Ox3.   No respiratory distress   Abd soft, NT, ND Normal external genitalia with patent urethral meatus. The bladder was palpably normal. The urethra was palpably normal. There was a pessary in place.  Cystoscopy Procedure Note  Patient identification was confirmed, informed consent was obtained, and patient was prepped using Betadine solution.  Lidocaine jelly was administered per urethral meatus.    Preoperative abx where received prior to procedure.    Procedure: - Flexible cystoscope introduced, without any difficulty.  To improve visualization the bladder was irrigated with irrigant. The bladder was drained and much of the dependent debris was removed.  - Thorough search of the bladder revealed:    normal urethral meatus    normal urothelium-there was squamous metaplasia from the trigone going up posteriorly.    no stones    no ulcers     no tumors    no urethral polyps    no trabeculation There were 2 small posterior diverticulum which were inspected in their entirety. There was dependent debris but no tumor. There was no obvious fistula. The bladder was filled with 50 mL of gentamicin solution.   - Ureteral orifices were normal in position and appearance.  Post-Procedure: - Patient tolerated the procedure well  Assessment/ Plan: Bladder wall thickening-likely from inflammation. No obvious tumor or fistula. Nl Exam.  We'll continue to monitor. Follow-up with Carollee Herter in about 6 weeks to assess lower urinary tract symptoms and  need for further therapy.  I discussed the CT and cysto findings with the patient and her son. We'll continue to monitor. She may need future scanning and cystoscopy if she develops further symptoms or gross hematuria.  A supressive abx might be helpful -- low-dose trimethoprim, Bactrim, cephalexin, nitrofurantoin can all be useful. It would depend on her prior cultures and further allergy challenges. We really appreciate Dr. Sampson Goon helping with her antibiotics.   Pamela Field, MD

## 2017-03-17 ENCOUNTER — Encounter: Payer: Self-pay | Admitting: Obstetrics & Gynecology

## 2017-03-17 ENCOUNTER — Ambulatory Visit (INDEPENDENT_AMBULATORY_CARE_PROVIDER_SITE_OTHER): Payer: Medicare Other | Admitting: Obstetrics & Gynecology

## 2017-03-17 VITALS — BP 120/80 | HR 67 | Ht 65.0 in | Wt 158.0 lb

## 2017-03-17 DIAGNOSIS — N8110 Cystocele, unspecified: Secondary | ICD-10-CM

## 2017-03-17 DIAGNOSIS — N8111 Cystocele, midline: Secondary | ICD-10-CM

## 2017-03-17 DIAGNOSIS — N952 Postmenopausal atrophic vaginitis: Secondary | ICD-10-CM | POA: Diagnosis not present

## 2017-03-17 NOTE — Progress Notes (Signed)
HPI:      Ms. Pamela [name] is a 83 y.o. G1P1001 who presents today for her pessary follow up and examination related to her pelvic floor weakening.  She has had pessary and Estring out since Nov to see if bladder sx's resolved, which they did not, so now desires the pessary and Estring back for treatment of her uncomfortable prolapse sx's.  Symptoms of pelvic floor weakening have greatly improved when using a pessary. She is voiding and defecating without difficulty. She has used a #3ring pessary.  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); Benign essential HTN (02/08/2012); Bladder infection, chronic (06/29/2013); BP (high blood pressure) (05/12/2012); Bursitis of knee (08/07/2014); Cellulitis in diabetic foot (HCC) (05/12/2012); Chronic kidney disease (06/01/2012); Clinical depression (08/28/2014); CN (constipation) (03/17/2012); Dermatitis due to drug reaction (05/15/2012); Diabetes mellitus; Diastolic dysfunction, left ventricle (05/14/2012); DM (diabetes mellitus) (HCC) (05/12/2012); Dysrhythmia; Edema leg (07/02/2014); Enthesopathy of knee (08/07/2014); Excess fluid volume (05/20/2012); FOM (frequency of micturition) (07/29/2012); Homero Fellers hematuria (09/27/2014); Gangrene (HCC); High cholesterol; HTN (hypertension) (05/12/2012); Hypercholesterolemia (05/12/2012); Hypertension; Incomplete bladder emptying (07/29/2012); Open wound of knee, leg (except thigh), and ankle (06/01/2012); and Stevens-Johnson syndrome (HCC) (05/15/2012). Also,  has a past surgical history that includes Toe amputation; Tonsillectomy; Abdominal hysterectomy; cataract surgery; and Endovenous ablation saphenous vein w/  laser., family history includes Diabetes in her maternal grandmother and paternal grandmother; Diabetes Mellitus II in her mother; Heart disease in her mother; Lung cancer in her father.,  reports that she has never smoked. She has never used smokeless tobacco. She reports that she does not drink alcohol or use drugs.  She has a current medication list which includes the following prescription(s): amiodarone, amitriptyline, atorvastatin, calcium carbonate, ciprofloxacin, estradiol, estradiol, glucose blood, insulin lispro protamine-lispro, lisinopril, magnesium, metformin, metoclopramide, metoprolol, novolog mix 70/30 flexpen, nystatin cream, pantoprazole, pharmacist choice lancets, phenazopyridine hcl, polyethylene glycol, align, tamsulosin, and terconazole. Also, is allergic to ciprofloxacin; sulfamethoxazole-trimethoprim; augmentin [amoxicillin-pot clavulanate]; levofloxacin; losartan potassium-hctz; nitrofurantoin; sulfa antibiotics; sulfasalazine; and levaquin [levofloxacin in d5w].  Review of Systems  Constitutional: Negative for chills, fever and malaise/fatigue.  HENT: Negative for congestion, sinus pain and sore throat.   Eyes: Negative for blurred vision and pain.  Respiratory: Negative for cough and wheezing.   Cardiovascular: Negative for chest pain and leg swelling.  Gastrointestinal: Negative for abdominal pain, constipation, diarrhea, heartburn, nausea and vomiting.  Genitourinary: Negative for dysuria, frequency, hematuria and urgency.  Musculoskeletal: Negative for back pain, joint pain, myalgias and neck pain.  Skin: Negative for itching and rash.  Neurological: Negative for dizziness, tremors and weakness.  Endo/Heme/Allergies: Does not bruise/bleed easily.  Psychiatric/Behavioral: Negative for depression. The patient is not nervous/anxious and does not have insomnia.    Objective: BP 120/80   Pulse 67   Ht  (1.651 m)   Wt 158 lb (71.7 kg)   BMI 26.29 kg/m  Physical  Exam  Constitutional: She is oriented to person, place, and time. She appears well-developed and well-nourished. No distress.  Musculoskeletal: Normal range of motion.  Neurological: She is alert and oriented to person, place, and time.  Skin: Skin is warm and dry.  Psychiatric: She has a normal mood and affect.  Vitals reviewed.   A/P: Vaginal Atrophy and prolapse, Cystocele  Pessary to be replaced soon, will get pessary and refill on Estring and bring back soon. Instructions given for care. Concerning symptoms to observe for are counseled to patient. Follow up scheduled for 3 months.  Annamarie Major, MD, [facility], [facility] 03/17/2017  5:01 PM

## 2017-03-18 ENCOUNTER — Ambulatory Visit (INDEPENDENT_AMBULATORY_CARE_PROVIDER_SITE_OTHER): Payer: Medicare Other | Admitting: Obstetrics & Gynecology

## 2017-03-18 ENCOUNTER — Encounter: Payer: Self-pay | Admitting: Obstetrics & Gynecology

## 2017-03-18 VITALS — BP 130/80 | Ht 65.0 in | Wt 155.0 lb

## 2017-03-18 DIAGNOSIS — N952 Postmenopausal atrophic vaginitis: Secondary | ICD-10-CM

## 2017-03-18 DIAGNOSIS — N8111 Cystocele, midline: Secondary | ICD-10-CM | POA: Diagnosis not present

## 2017-03-18 NOTE — Progress Notes (Signed)
   Ms. Pamela [name] is a 83 y.o. G1P1001 who presents today for her pessary follow up and examination related to her pelvic floor weakening.  Symptoms of pelvic floor weakening are usually improved with pessary, and vaginal atrophy improved with Estring.  Has been on holiday for this.  She currently has a Ring#3 pessary and Estring.  See note from yesterday for more details.  AF, VSS. Physical examination Constitutional NAD, Conversant  Skin No rashes, lesions or ulceration. Normal palpated skin turgor. No nodularity.  Abdomen: Soft.  Non-tender.  No masses.  No HSM. No hernia  Extremities: Moves all appropriately.  Normal ROM for age. No lymphadenopathy.  Neuro: Grossly intact  Psych: Oriented to PPT.  Normal mood. Normal affect.     Pelvic:   Vulva: Normal appearance.  No lesions.  Vagina: No lesions or abnormalities noted. Atrophy.  Support: Weakenedl pelvic support.  Urethra No masses tenderness or scarring.  Meatus Normal size without lesions or prolapse.  Cervix: Normal appearance.  No lesions. Prolapse.  Anus: Normal exam.  No lesions.  Perineum: Normal exam.  No lesions.        Bimanual   Uterus: Normal size.  Non-tender.  Mobile.  AV.  Adnexae: No masses.  Non-tender to palpation.  Cul-de-sac: Negative for abnormality.  Pessary and Estring placed without difficulty.  A/P: Uterovaginal Prolapse, Vaginal Atrophy Pessary Estring f/u 3 mos Care instructions discussed.  Annamarie Major, MD, [facility], [facility] 03/18/2017  2:22 PM

## 2017-04-05 ENCOUNTER — Telehealth: Payer: Self-pay | Admitting: Urology

## 2017-04-05 NOTE — Telephone Encounter (Signed)
Pt called today and left a detailed message about a procedure she had done that worked great.  She said it lasted for about a month, but now she is having urinary frequency again and pain.  Please give her a call.

## 2017-04-06 NOTE — Telephone Encounter (Signed)
Spoke with pt about procedure she had done and was requesting to have done again. Pt stated that she had her bladder vacuumed? And was requesting procedure to be done again. Pt stated that after procedure she did great for a month and now she is experiencing UTI s/s again. Pt described UTI s/s to be frequency, urgency, dysuria, and pain. Please advise.

## 2017-04-06 NOTE — Telephone Encounter (Signed)
Dr. Mena GoesEskridge irrigated her bladder during the cystoscopy.  We can certainly irrigate her bladder in the office.

## 2017-04-07 NOTE — Telephone Encounter (Signed)
Spoke with pt in reference bladder irrigation. Pt voiced understanding. Pt was added to nurse schedule for tomorrow for bladder irrigation.

## 2017-04-07 NOTE — Telephone Encounter (Signed)
No answer

## 2017-04-08 ENCOUNTER — Ambulatory Visit: Payer: Self-pay

## 2017-04-09 ENCOUNTER — Ambulatory Visit (INDEPENDENT_AMBULATORY_CARE_PROVIDER_SITE_OTHER): Payer: Medicare Other | Admitting: Family Medicine

## 2017-04-09 VITALS — BP 115/65 | HR 74 | Ht 65.0 in | Wt 149.7 lb

## 2017-04-09 DIAGNOSIS — N390 Urinary tract infection, site not specified: Secondary | ICD-10-CM

## 2017-04-09 NOTE — Progress Notes (Signed)
Bladder Irrigation  Due to bladder wall thickening patient is present today for a bladder irrigation. Patient was cleaned and prepped in a sterile fashion. 500 ml of saline/sterile water was instilled and irrigated into the bladder with a 70ml To[place]y syringe through the catheter in place.  After irrigation, no complications. Patient tolerated well.   Preformed by: C.Rana SnareLowe, CMA Additional notes/ Follow up: As needed

## 2017-04-14 ENCOUNTER — Ambulatory Visit (INDEPENDENT_AMBULATORY_CARE_PROVIDER_SITE_OTHER): Payer: Medicare Other | Admitting: Urology

## 2017-04-14 ENCOUNTER — Encounter: Payer: Self-pay | Admitting: Urology

## 2017-04-14 VITALS — BP 137/72 | HR 72 | Ht 65.0 in | Wt 147.8 lb

## 2017-04-14 DIAGNOSIS — R300 Dysuria: Secondary | ICD-10-CM

## 2017-04-14 LAB — URINALYSIS, COMPLETE
BILIRUBIN UA: NEGATIVE
Glucose, UA: NEGATIVE
KETONES UA: NEGATIVE
Nitrite, UA: NEGATIVE
Specific Gravity, UA: 1.01 (ref 1.005–1.030)
Urobilinogen, Ur: 0.2 mg/dL (ref 0.2–1.0)

## 2017-04-14 LAB — MICROSCOPIC EXAMINATION
Epithelial Cells (non renal): NONE SEEN /hpf (ref 0–10)
RBC, UA: NONE SEEN /hpf (ref 0–?)

## 2017-04-14 NOTE — Patient Instructions (Signed)

## 2017-04-14 NOTE — Progress Notes (Addendum)
04/14/2017 3:45 PM   Pamela [name] [birth year] [id number]  Referring provider: Leonel Ramsay, MD [address], [place] [address]  Chief Complaint  Patient presents with  . Urinary Tract Infection    patient thinks she has an uti  . Vaginal Discharge    HPI: Patient is a 83 year old Caucasian female who presents today requesting an urgent appointment for symptoms of an UTI and vaginal discharge.    Recurrent UTI Patient has had > 5 UTI's over the last year.  Risks for UTI's are age, incontinence, memory loss and vaginal atrophy.    Her CATH UA noted 11-30 WBC's and many bacteria.  Patient states that over weekend she started to experience pain in the bladder stem and vaginal itching.   She states when she stood up, the urine would shoot out.  This is new to her.  She is not experiencing gross hematuria.  She has not had fevers, chills, nausea or vomiting.     Bladder wall thickening A CT of the pelvis was performed on 04/14/2012 and it noted no hydronephrosis, but irregular thickened bladder wall with debris within the bladder and left renal cysts.  Patient underwent a cystoscopy on 09/27/2014 with Dr. Bernardo Heater and no abnormalities were noted.  Renal ultrasound completed on 09/29/2016 noted that the urinary bladder wall is irregularly thickened with increased vascularity questioning significant inflammatory change versus possible neoplastic infiltration within the urinary bladder. And a left renal cyst which appears slightly larger than previous study.   Patient underwent cystoscopy on 03/16/2017 with Dr. Junious Silk and bladder was irrigated of debris and two small diverticulum where inspected.  No tumors were seen.  Incontinence Patient was not a candidate for anticholinergics due to her memory issues.  Her BP is now under control and we may try Myrbetriq.   She did go through a series of 12 weekly PTNS, but she has missed several maintenance PTNS.  Her baseline urinary  symptoms consist of frequency, urgency, nocturia, straining to urinate, hesitancy and a weak urinary stream.  She is going through 7 to 8 pads daily.    PMH: Past Medical History:  Diagnosis Date  . A-fib ([place]) 08/28/2014   Overview:  Overview:  Diastolic dysfunction on ECHO 04/2012-done at [facility]   . Abnormal gait 02/08/2012  . Abscess or cellulitis of leg 04/18/2012  . Absence of bladder continence 07/29/2012  . Acid reflux 02/08/2012  . Acute kidney failure ([place]) 05/15/2012  . Afib, paroxysmal, with RVR at times with BBB 05/13/2012  . Allergic drug rash 05/15/2012  . Anemia 05/13/2012  . 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 Benign essential HTN 02/08/2012  . Bladder infection, chronic 06/29/2013  . BP (high blood pressure) 05/12/2012  . Bursitis of knee 08/07/2014  . Cellulitis in diabetic foot ([place]) 05/12/2012  . Chronic kidney disease 06/01/2012   Overview:  Overview:   12/2012- Renal US, simple L cyst. UIEP normal   . Clinical depression 08/28/2014  . CN (constipation) 03/17/2012  . Dermatitis due to drug reaction 05/15/2012  . Diabetes mellitus   . Diastolic dysfunction, left ventricle 05/14/2012  . DM (diabetes mellitus) ([place]) 05/12/2012  . Dysrhythmia   . Dysuria   . Edema leg 07/02/2014  . Enthesopathy of knee 08/07/2014  . Excess fluid volume 05/20/2012  . FOM (frequency of micturition) 07/29/2012  . Frank hematuria 09/27/2014  . Gangrene ([place])   . GERD (gastroesophageal reflux disease)   . Heart disease   .  High cholesterol   . HTN (hypertension) 05/12/2012  . Hypercholesterolemia 05/12/2012  . Hypertension   . Incomplete bladder emptying 07/29/2012  . Open wound of knee, leg (except thigh), and ankle 06/01/2012  . 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   Overview:  Overview:  Several years ago, allergic reaction to sulfa antibiotic Several years ago, allergic reaction to sulfa antibiotic     Surgical History: Past Surgical History:  Procedure Laterality Date  . ABDOMINAL HYSTERECTOMY     . BLADDER SURGERY    . cataract surgery    . ENDOVENOUS ABLATION SAPHENOUS VEIN W/ LASER    . NASAL SINUS SURGERY    . TOE AMPUTATION    . TONSILLECTOMY      Home Medications:  Allergies as of 04/14/2017      Reactions   Ciprofloxacin Anaphylaxis   Sulfamethoxazole-trimethoprim Other (See Comments), Rash   Went into Steven Johnson Sydrome.   Augmentin [amoxicillin-pot Clavulanate] Hives   Levofloxacin Other (See Comments)   Losartan Potassium-hctz Other (See Comments)   Other reaction(s): Unknown   Nitrofurantoin Other (See Comments)   Sulfa Antibiotics Other (See Comments)   Stevens johnson Steven's Johnsons   Sulfasalazine    unknown   Levaquin [levofloxacin In D5w]       Medication List       Accurate as of 04/14/17  3:45 PM. Always use your most recent med list.          ALIGN 4 MG Caps Take by mouth. Reported on 02/26/2016   amiodarone 200 MG tablet Commonly known as:  PACERONE TAKE 1 TABLET (200 MG TOTAL) BY MOUTH ONCE DAILY.   amitriptyline 50 MG tablet Commonly known as:  ELAVIL TAKE 1 TABLET (50 MG TOTAL) BY MOUTH NIGHTLY.   atorvastatin 40 MG tablet Commonly known as:  LIPITOR Take 40 mg by mouth daily.   AZO-STANDARD PO Take by mouth.   calcium carbonate 1250 (500 Ca) MG tablet Commonly known as:  OS-CAL - dosed in mg of elemental calcium Take 1 tablet (500 mg of elemental calcium total) by mouth 3 (three) times daily.   ciprofloxacin 500 MG tablet Commonly known as:  CIPRO Take 500 mg by mouth 2 (two) times daily.   estradiol 0.1 MG/GM vaginal cream Commonly known as:  ESTRACE Place 1/2 Gram (pea size amount) daily at urethral opening daily for 2 weeks, then use 3 times per week.   estradiol 2 MG vaginal ring Commonly known as:  ESTRING Place 2 mg vaginally every 3 (three) months. follow package directions   glucose blood test strip Reported on 12/18/2015   HUMALOG MIX 75/25 (75-25) 100 UNIT/ML Susp injection Generic drug:  insulin  lispro protamine-lispro   lisinopril 20 MG tablet Commonly known as:  PRINIVIL,ZESTRIL Take 20 mg by mouth daily.   magnesium 30 MG tablet Take 30 mg by mouth 2 (two) times daily.   metFORMIN 1000 MG tablet Commonly known as:  GLUCOPHAGE Take 1,000 mg by mouth 2 (two) times daily.   metoCLOPramide 5 MG tablet Commonly known as:  REGLAN Take 1 tablet (5 mg total) by mouth 4 (four) times daily -  before meals and at bedtime.   metoprolol tartrate 50 MG tablet Commonly known as:  LOPRESSOR Take 1 tablet (50 mg total) by mouth 2 (two) times daily.   NOVOLOG MIX 70/30 FLEXPEN (70-30) 100 UNIT/ML FlexPen Generic drug:  insulin aspart protamine - aspart INJECT 20 UNITS SUBQ EVERY MORNING AND 18 UNITS DAILY AT 5PM   nystatin   cream Commonly known as:  MYCOSTATIN Apply 1 application topically 2 (two) times daily.   pantoprazole 40 MG tablet Commonly known as:  PROTONIX Take 40 mg by mouth daily.   PHARMACIST CHOICE LANCETS Misc USE 2 TIMES DAILY FOR DIABETIC TESTING   polyethylene glycol packet Commonly known as:  MIRALAX / GLYCOLAX Take by mouth. Reported on 12/18/2015   tamsulosin 0.4 MG Caps capsule Commonly known as:  FLOMAX Take by mouth.   terconazole 0.8 % vaginal cream Commonly known as:  TERAZOL 3 Place 1 applicator vaginally 3 (three) times a week. 3 nights weekly       Allergies:  Allergies  Allergen Reactions  . Ciprofloxacin Anaphylaxis  . Sulfamethoxazole-Trimethoprim Other (See Comments) and Rash    Went into Steven Johnson Sydrome.  . Augmentin [Amoxicillin-Pot Clavulanate] Hives  . Levofloxacin Other (See Comments)  . Losartan Potassium-Hctz Other (See Comments)    Other reaction(s): Unknown  . Nitrofurantoin Other (See Comments)  . Sulfa Antibiotics Other (See Comments)    Stevens johnson Steven's Johnsons  . Sulfasalazine     unknown  . Levaquin [Levofloxacin In D5w]     Family History: Family History  Problem Relation Age of Onset  .  Diabetes Mellitus II Mother   . Heart disease Mother   . Lung cancer Father   . Diabetes Paternal Grandmother   . Diabetes Maternal Grandmother   . Kidney disease Neg Hx   . Prostate cancer Neg Hx   . Bladder Cancer Neg Hx     Social History:  reports that she has never smoked. She has never used smokeless tobacco. She reports that she does not drink alcohol or use drugs.  ROS: UROLOGY Frequent Urination?: Yes Hard to postpone urination?: Yes Burning/pain with urination?: Yes Get up at night to urinate?: Yes Leakage of urine?: Yes Urine stream starts and stops?: No Trouble starting stream?: Yes Do you have to strain to urinate?: Yes Blood in urine?: No Urinary tract infection?: Yes Sexually transmitted disease?: No Injury to kidneys or bladder?: No Painful intercourse?: No Weak stream?: Yes Currently pregnant?: No Vaginal bleeding?: No Last menstrual period?: n  Gastrointestinal Nausea?: No Vomiting?: No Indigestion/heartburn?: No Diarrhea?: No Constipation?: Yes  Constitutional Fever: No Night sweats?: No Weight loss?: Yes Fatigue?: Yes  Skin Skin rash/lesions?: No Itching?: Yes  Eyes Blurred vision?: No Double vision?: No  Ears/Nose/Throat Sore throat?: No Sinus problems?: No  Hematologic/Lymphatic Swollen glands?: No Easy bruising?: Yes  Cardiovascular Leg swelling?: No Chest pain?: No  Respiratory Cough?: No Shortness of breath?: No  Endocrine Excessive thirst?: No  Musculoskeletal Back pain?: Yes Joint pain?: No  Neurological Headaches?: No Dizziness?: No  Psychologic Depression?: No Anxiety?: No  Physical Exam: BP 137/72   Pulse 72   Ht 5' 5" (1.651 m)   Wt 147 lb 12.8 oz (67 kg)   BMI 24.60 kg/m   Constitutional: Well nourished. Alert and oriented, No acute distress. HEENT: [place] AT, moist mucus membranes. Trachea midline, no masses. Cardiovascular: No clubbing, cyanosis, or edema. Respiratory: Normal respiratory  effort, no increased work of breathing. GI: Abdomen is soft, non tender, non distended, no abdominal masses. Liver and spleen not palpable.  No hernias appreciated.  Stool sample for occult testing is not indicated.   GU: No CVA tenderness.  No bladder fullness or masses.  Atrophic external genitalia, normal pubic hair distribution, no lesions.  Normal urethral meatus, no lesions, no prolapse, no discharge.   No urethral masses, tenderness and/or tenderness. No   bladder fullness, tenderness or masses. Pale vagina mucosa, poor estrogen effect, no discharge, no lesions, good pelvic support, pessary in place.  Cervix, uterus and adnexa are surgically absent.   Anus and perineum are without rashes or lesions.    Skin: No rashes, bruises or suspicious lesions. Lymph: No cervical or inguinal adenopathy. Neurologic: Grossly intact, no focal deficits, moving all 4 extremities. Psychiatric: Normal mood and affect.  Laboratory Data: Lab Results  Component Value Date   WBC 8.3 06/22/2016   HGB 10.3 (L) 06/22/2016   HCT 31.1 (L) 06/22/2016   MCV 96.3 06/22/2016   PLT 246 06/22/2016    Lab Results  Component Value Date   CREATININE 1.45 (H) 06/22/2016    Lab Results  Component Value Date   HGBA1C 7.6 (H) 05/13/2012    Lab Results  Component Value Date   TSH 2.546 05/19/2012       Component Value Date/Time   CHOL 119 07/08/2013 0739   HDL 58 07/08/2013 0739   VLDL 22 07/08/2013 0739   LDLCALC 39 07/08/2013 0739    Lab Results  Component Value Date   AST 21 06/22/2016   Lab Results  Component Value Date   ALT 20 06/22/2016     Urinalysis 11-30 WBC's.  Many bacteria.  See EPIC.   Assessment & Plan:    1. Recurrent UTI's  - criteria for recurrent UTI has been met with 2 or more infections in 6 months or 3 or greater infections in one year   - Patient is instructed to increase their water intake until the urine is pale yellow or clear (10 to 12 cups daily)   - probiotics  (yogurt, oral pills or vaginal suppositories), take cranberry pills or drink the juice and Vitamin C 1,000 mg daily to acidify the urine should be added to their daily regimen   - avoid soaking in tubs and wipe front to back after urinating   - advised them to have CATH UA's for urinalysis and culture to prevent skin contamination of the specimen  - reviewed symptoms of UTI and advised not to have urine checked or be treated for UTI if not experiencing symptoms  - discussed antibiotic stewardship with the patient    - Today's cath UA was suspicious for infection - will send for culture - will hold on antibiotic until sensitivities are available     2. Vaginal atrophy  - continue the vaginal estrogen cream tid weekly  3. Incontinence  - will have a trial of Myrbetriq 25 mg   - RTC in 3 weeks for OAB questionnaire and PVR                                        Return in about 3 weeks (around 05/05/2017) for PVR and OAB questionnaire.  These notes generated with voice recognition software. I apologize for typographical errors.   , PA-C  [facility] [address], [address] [place], [place] [address] [phone]  

## 2017-04-14 NOTE — Progress Notes (Signed)
In and Out Catheterization  Patient is present today for a I & O catheterization due to needing a clean catch specimen to see if she has an uti. Patient was cleaned and prepped in a sterile fashion with betadine and Lidocaine 2% jelly was instilled into the urethra.  A 14FR cath was inserted no complications were noted , 45ml urine sample was collected for urinalysis. Bladder was drained and catheter was removed with out difficulty.    Preformed by: Dallas Schimkeamona Demaree Liberto CMA

## 2017-04-19 LAB — CULTURE, URINE COMPREHENSIVE

## 2017-04-20 ENCOUNTER — Telehealth: Payer: Self-pay | Admitting: *Deleted

## 2017-04-20 NOTE — Telephone Encounter (Signed)
-----   Message from Vanna ScotlandAshley Brandon, MD sent at 04/20/2017  2:47 PM EDT ----- Patient grew mixed flora which is indicative of contamination. If she is truly concerned about infection, she should come in for a catheterized specimen.  Vanna ScotlandAshley Brandon, MD

## 2017-04-20 NOTE — Telephone Encounter (Signed)
Spoke with Dr. Apolinar JunesBrandon to let her know that this was a cath specimen, Patient had an incredible amount of vaginal discharge at time of cath. Spoke with patient to let her know urine culture did not grow out a bacteria. Patient states does not have any symptoms at this time. I let her know that we would check again at appointment next week if she develops symptoms. Patient ok with plan.

## 2017-04-23 ENCOUNTER — Telehealth: Payer: Self-pay | Admitting: Urology

## 2017-04-23 NOTE — Telephone Encounter (Signed)
Spoke with pt son in reference to pt having severe pain with urination. Offered for pt to have nurse visit today for cath specimen. Son declined pt to have n/v, f/c, lower abd pain, back pain. Reinforced with son that pt ucx came back mixed flora therefore no sensitivities were performed. Son declined nurse visit stating "she doesn't seem to have UTI like s/s. My wife thinks it is a yeast infection." Son then stated that pt is in tears when she urinates and then has clawed herself due to the itching. Reinforced with son that pt had a large amount of vaginal discharge at last visit. Son inquired about getting OTC monistat for pt. Reinforced with son that would be worth trying. Also reinforced with son pt should be using the vaginal cream given by Carollee HerterShannon to help with vaginal atrophy. Son stated that he would remind pt to use medication. Son then stated that he would call next week should s/s not get any better. Reinforced with son should pt develop n/v, f/c over the weekend should seek tx at the ER. Son voiced understanding of whole conversation.

## 2017-04-23 NOTE — Telephone Encounter (Signed)
Pt son called office stating his mother is crying every time she urinates. Thinks mother has another infection. Was here and gave sample on May 16th.

## 2017-04-26 NOTE — Progress Notes (Deleted)
04/27/2017 4:33 PM   Pamela [name] June 11, 1934 [id number]  Referring provider: Leonel Ramsay, MD [place],  [address]  No chief complaint on file.   HPI: Patient is a 83 year old Caucasian female who presents today requesting an urgent appointment for symptoms of an UTI and vaginal discharge.    Recurrent UTI Patient has had > 5 UTI's over the last year.  Risks for UTI's are age, incontinence, memory loss and vaginal atrophy.    Her CATH UA noted 11-30 WBC's and many bacteria.  Patient states that over weekend she started to experience pain in the bladder stem and vaginal itching.   She states when she stood up, the urine would shoot out.  This is new to her.  She is not experiencing gross hematuria.  She has not had fevers, chills, nausea or vomiting.     Bladder wall thickening A CT of the pelvis was performed on 04/14/2012 and it noted no hydronephrosis, but irregular thickened bladder wall with debris within the bladder and left renal cysts.  Patient underwent a cystoscopy on 09/27/2014 with Dr. Bernardo Heater and no abnormalities were noted.  Renal ultrasound completed on 09/29/2016 noted that the urinary bladder wall is irregularly thickened with increased vascularity questioning significant inflammatory change versus possible neoplastic infiltration within the urinary bladder. And a left renal cyst which appears slightly larger than previous study.   Patient underwent cystoscopy on 03/16/2017 with Dr. Junious Silk and bladder was irrigated of debris and two small diverticulum where inspected.  No tumors were seen.  Incontinence Patient was not a candidate for anticholinergics due to her memory issues.  Her BP is now under control and we may try Myrbetriq.   She did go through a series of 12 weekly PTNS, but she has missed several maintenance PTNS.  Her baseline urinary symptoms consist of frequency, urgency, nocturia, straining to urinate, hesitancy and a weak  urinary stream.  She is going through 7 to 8 pads daily.    PMH: Past Medical History:  Diagnosis Date  . A-fib ([place]) 08/28/2014   Overview:  Overview:  Diastolic dysfunction on ECHO 04/2012-done at [facility]   . Abnormal gait 02/08/2012  . Abscess or cellulitis of leg 04/18/2012  . Absence of bladder continence 07/29/2012  . Acid reflux 02/08/2012  . Acute kidney failure ([place]) 05/15/2012  . Afib, paroxysmal, with RVR at times with BBB 05/13/2012  . Allergic drug rash 05/15/2012  . Anemia 05/13/2012  . 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 Benign essential HTN 02/08/2012  . Bladder infection, chronic 06/29/2013  . BP (high blood pressure) 05/12/2012  . Bursitis of knee 08/07/2014  . Cellulitis in diabetic foot ([place]) 05/12/2012  . Chronic kidney disease 06/01/2012   Overview:  Overview:   12/2012- Renal US, simple L cyst. UIEP normal   . Clinical depression 08/28/2014  . CN (constipation) 03/17/2012  . Dermatitis due to drug reaction 05/15/2012  . Diabetes mellitus   . Diastolic dysfunction, left ventricle 05/14/2012  . DM (diabetes mellitus) ([place]) 05/12/2012  . Dysrhythmia   . Dysuria   . Edema leg 07/02/2014  . Enthesopathy of knee 08/07/2014  . Excess fluid volume 05/20/2012  . FOM (frequency of micturition) 07/29/2012  . Frank hematuria 09/27/2014  . Gangrene ([place])   . GERD (gastroesophageal reflux disease)   . Heart disease   . High cholesterol   . HTN (hypertension) 05/12/2012  . Hypercholesterolemia 05/12/2012  . Hypertension   . Incomplete bladder  emptying 07/29/2012  . Open wound of knee, leg (except thigh), and ankle 06/01/2012  . Stevens-Johnson syndrome ([place]) 05/15/2012   Overview:  Overview:  Several years ago, allergic reaction to sulfa antibiotic Several years ago, allergic reaction to sulfa antibiotic     Surgical History: Past Surgical History:  Procedure Laterality Date  . ABDOMINAL HYSTERECTOMY    . BLADDER SURGERY    . cataract surgery    . ENDOVENOUS ABLATION SAPHENOUS VEIN W/ LASER     . NASAL SINUS SURGERY    . TOE AMPUTATION    . TONSILLECTOMY      Home Medications:  Allergies as of 04/27/2017      Reactions   Ciprofloxacin Anaphylaxis   Sulfamethoxazole-trimethoprim Other (See Comments), Rash   Went into [employer] Sydrome.   Augmentin [amoxicillin-pot Clavulanate] Hives   Levofloxacin Other (See Comments)   Losartan Potassium-hctz Other (See Comments)   Other reaction(s): Unknown   Nitrofurantoin Other (See Comments)   Sulfa Antibiotics Other (See Comments)   Kathreen Cosier Steven's Johnsons   Sulfasalazine    unknown   Levaquin [levofloxacin In D5w]       Medication List       Accurate as of 04/26/17  4:33 PM. Always use your most recent med list.          ALIGN 4 MG Caps Take by mouth. Reported on 02/26/2016   amiodarone 200 MG tablet Commonly known as:  PACERONE TAKE 1 TABLET (200 MG TOTAL) BY MOUTH ONCE DAILY.   amitriptyline 50 MG tablet Commonly known as:  ELAVIL TAKE 1 TABLET (50 MG TOTAL) BY MOUTH NIGHTLY.   atorvastatin 40 MG tablet Commonly known as:  LIPITOR Take 40 mg by mouth daily.   AZO-STANDARD PO Take by mouth.   calcium carbonate 1250 (500 Ca) MG tablet Commonly known as:  OS-CAL - dosed in mg of elemental calcium Take 1 tablet (500 mg of elemental calcium total) by mouth 3 (three) times daily.   ciprofloxacin 500 MG tablet Commonly known as:  CIPRO Take 500 mg by mouth 2 (two) times daily.   estradiol 0.1 MG/GM vaginal cream Commonly known as:  ESTRACE Place 1/2 Gram (pea size amount) daily at urethral opening daily for 2 weeks, then use 3 times per week.   estradiol 2 MG vaginal ring Commonly known as:  ESTRING Place 2 mg vaginally every 3 (three) months. follow package directions   glucose blood test strip Reported on 12/18/2015   HUMALOG MIX 75/25 (75-25) 100 UNIT/ML Susp injection Generic drug:  insulin lispro protamine-lispro   lisinopril 20 MG tablet Commonly known as:  PRINIVIL,ZESTRIL Take  20 mg by mouth daily.   magnesium 30 MG tablet Take 30 mg by mouth 2 (two) times daily.   metFORMIN 1000 MG tablet Commonly known as:  GLUCOPHAGE Take 1,000 mg by mouth 2 (two) times daily.   metoCLOPramide 5 MG tablet Commonly known as:  REGLAN Take 1 tablet (5 mg total) by mouth 4 (four) times daily -  before meals and at bedtime.   metoprolol tartrate 50 MG tablet Commonly known as:  LOPRESSOR Take 1 tablet (50 mg total) by mouth 2 (two) times daily.   NOVOLOG MIX 70/30 FLEXPEN (70-30) 100 UNIT/ML FlexPen Generic drug:  insulin aspart protamine - aspart INJECT 20 UNITS SUBQ EVERY MORNING AND 18 UNITS DAILY AT 5PM   nystatin cream Commonly known as:  MYCOSTATIN Apply 1 application topically 2 (two) times daily.   pantoprazole 40 MG tablet  Commonly known as:  PROTONIX Take 40 mg by mouth daily.   PHARMACIST CHOICE LANCETS Misc USE 2 TIMES DAILY FOR DIABETIC TESTING   polyethylene glycol packet Commonly known as:  MIRALAX / GLYCOLAX Take by mouth. Reported on 12/18/2015   tamsulosin 0.4 MG Caps capsule Commonly known as:  FLOMAX Take by mouth.   terconazole 0.8 % vaginal cream Commonly known as:  TERAZOL 3 Place 1 applicator vaginally 3 (three) times a week. 3 nights weekly       Allergies:  Allergies  Allergen Reactions  . Ciprofloxacin Anaphylaxis  . Sulfamethoxazole-Trimethoprim Other (See Comments) and Rash    Went into [employer].  . Augmentin [Amoxicillin-Pot Clavulanate] Hives  . Levofloxacin Other (See Comments)  . Losartan Potassium-Hctz Other (See Comments)    Other reaction(s): Unknown  . Nitrofurantoin Other (See Comments)  . Sulfa Antibiotics Other (See Comments)    Kathreen Cosier Steven's Johnsons  . Sulfasalazine     unknown  . Levaquin [Levofloxacin In D5w]     Family History: Family History  Problem Relation Age of Onset  . Diabetes Mellitus II Mother   . Heart disease Mother   . Lung cancer Father   . Diabetes  Paternal Grandmother   . Diabetes Maternal Grandmother   . Kidney disease Neg Hx   . Prostate cancer Neg Hx   . Bladder Cancer Neg Hx     Social History:  reports that she has never smoked. She has never used smokeless tobacco. She reports that she does not drink alcohol or use drugs.  ROS:                                        Physical Exam: There were no vitals taken for this visit.  Constitutional: Well nourished. Alert and oriented, No acute distress. HEENT: [place] AT, moist mucus membranes. Trachea midline, no masses. Cardiovascular: No clubbing, cyanosis, or edema. Respiratory: Normal respiratory effort, no increased work of breathing. GI: Abdomen is soft, non tender, non distended, no abdominal masses. Liver and spleen not palpable.  No hernias appreciated.  Stool sample for occult testing is not indicated.   GU: No CVA tenderness.  No bladder fullness or masses.  Atrophic external genitalia, normal pubic hair distribution, no lesions.  Normal urethral meatus, no lesions, no prolapse, no discharge.   No urethral masses, tenderness and/or tenderness. No bladder fullness, tenderness or masses. Pale vagina mucosa, poor estrogen effect, no discharge, no lesions, good pelvic support, pessary in place.  Cervix, uterus and adnexa are surgically absent.   Anus and perineum are without rashes or lesions.    Skin: No rashes, bruises or suspicious lesions. Lymph: No cervical or inguinal adenopathy. Neurologic: Grossly intact, no focal deficits, moving all 4 extremities. Psychiatric: Normal mood and affect.  Laboratory Data: Lab Results  Component Value Date   WBC 8.3 06/22/2016   HGB 10.3 (L) 06/22/2016   HCT 31.1 (L) 06/22/2016   MCV 96.3 06/22/2016   PLT 246 06/22/2016    Lab Results  Component Value Date   CREATININE 1.45 (H) 06/22/2016    Lab Results  Component Value Date   HGBA1C 7.6 (H) 05/13/2012    Lab Results  Component Value Date   TSH 2.546  05/19/2012       Component Value Date/Time   CHOL 119 07/08/2013 0739   HDL 58 07/08/2013 0739   VLDL  22 07/08/2013 0739   LDLCALC 39 07/08/2013 0739    Lab Results  Component Value Date   AST 21 06/22/2016   Lab Results  Component Value Date   ALT 20 06/22/2016     Urinalysis 11-30 WBC's.  Many bacteria.  See EPIC.   Assessment & Plan:    1. Recurrent UTI's  - criteria for recurrent UTI has been met with 2 or more infections in 6 months or 3 or greater infections in one year   - Patient is instructed to increase their water intake until the urine is pale yellow or clear (10 to 12 cups daily)   - probiotics (yogurt, oral pills or vaginal suppositories), take cranberry pills or drink the juice and Vitamin C 1,000 mg daily to acidify the urine should be added to their daily regimen   - avoid soaking in tubs and wipe front to back after urinating   - advised them to have CATH UA's for urinalysis and culture to prevent skin contamination of the specimen  - reviewed symptoms of UTI and advised not to have urine checked or be treated for UTI if not experiencing symptoms  - discussed antibiotic stewardship with the patient    - Today's cath UA was suspicious for infection - will send for culture - will hold on antibiotic until sensitivities are available     2. Vaginal atrophy  - continue the vaginal estrogen cream tid weekly  3. Incontinence  - will have a trial of Myrbetriq 25 mg   - RTC in 3 weeks for OAB questionnaire and PVR                                        No Follow-up on file.  These notes generated with voice recognition software. I apologize for typographical errors.  Pamela [name], [facility] [address] [place], [place] [address] [phone]

## 2017-04-27 ENCOUNTER — Ambulatory Visit: Payer: Medicare Other | Admitting: Urology

## 2017-05-24 NOTE — Progress Notes (Signed)
05/26/2017 3:22 PM   Bobbe Medico [birth year] [id number]  Referring provider: Leonel Ramsay, MD [place], [place] [address]  Chief Complaint  Patient presents with  . Follow-up    painful urination    HPI: Patient is a 83 year old Caucasian female who presents today for a follow up for complaints of vaginal burning.      Recurrent UTI Patient has had > 5 UTI's over the last year.  Risks for UTI's are age, incontinence, memory loss and vaginal atrophy.   She is not experiencing gross hematuria.   She has not had fevers, chills, nausea or vomiting.  Patient states she continues to have pain in her bladder stem. She states it is continuous. She states that sometimes it reaches levels 10 out of 10. The pain can occur 5 out of 7 days.  She describes it as a burning sensation. She states it does not burn when she urinates. She states that laying down seems to ease the pain. She has been using over-the-counter Monistat cream without relief of her symptoms.  Her cath UA today was positive for 6-10 WBC's with many bacteria.    Bladder wall thickening A CT of the pelvis was performed on 04/14/2012 and it noted no hydronephrosis, but irregular thickened bladder wall with debris within the bladder and left renal cysts.  Patient underwent a cystoscopy on 09/27/2014 with Dr. Bernardo Heater and no abnormalities were noted.  Renal ultrasound completed on 09/29/2016 noted that the urinary bladder wall is irregularly thickened with increased vascularity questioning significant inflammatory change versus possible neoplastic infiltration within the urinary bladder. And a left renal cyst which appears slightly larger than previous study.   Patient underwent cystoscopy on 03/16/2017 with Dr. Junious Silk and bladder was irrigated of debris and two small diverticulum where inspected.  No tumors were seen.    Incontinence Patient was not a candidate for anticholinergics due to her memory issues.   Her BP is now under control and she was given a trial of Myrbetriq at her last visit.  She did go through a series of 12 weekly PTNS, but she has missed several maintenance PTNS.  Her baseline urinary symptoms consist of frequency, urgency, nocturia, straining to urinate, hesitancy and a weak urinary stream.  She is going through 7 to 8 pads daily.  She was started on Myrbetriq 25 mg daily at her last visit.  She has a pessary in place.  She did not find the Myrbetriq effective.    PMH: Past Medical History:  Diagnosis Date  . A-fib ([place]) 08/28/2014   Overview:  Overview:  Diastolic dysfunction on ECHO 04/2012-done at [facility]   . Abnormal gait 02/08/2012  . Abscess or cellulitis of leg 04/18/2012  . Absence of bladder continence 07/29/2012  . Acid reflux 02/08/2012  . Acute kidney failure ([place]) 05/15/2012  . Afib, paroxysmal, with RVR at times with BBB 05/13/2012  . Allergic drug rash 05/15/2012  . Anemia 05/13/2012  . 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 Benign essential HTN 02/08/2012  . Bladder infection, chronic 06/29/2013  . BP (high blood pressure) 05/12/2012  . Bursitis of knee 08/07/2014  . Cellulitis in diabetic foot ([place]) 05/12/2012  . Chronic kidney disease 06/01/2012   Overview:  Overview:   12/2012- Renal US, simple L cyst. UIEP normal   . Clinical depression 08/28/2014  . CN (constipation) 03/17/2012  . Dermatitis due to drug reaction 05/15/2012  . Diabetes mellitus   . Diastolic dysfunction,  left ventricle 05/14/2012  . DM (diabetes mellitus) ([place]) 05/12/2012  . Dysrhythmia   . Dysuria   . Edema leg 07/02/2014  . Enthesopathy of knee 08/07/2014  . Excess fluid volume 05/20/2012  . FOM (frequency of micturition) 07/29/2012  . Frank hematuria 09/27/2014  . Gangrene ([place])   . GERD (gastroesophageal reflux disease)   . Heart disease   . High cholesterol   . HTN (hypertension) 05/12/2012  . Hypercholesterolemia 05/12/2012  . Hypertension   . Incomplete bladder emptying 07/29/2012  . Open wound of  knee, leg (except thigh), and ankle 06/01/2012  . Stevens-Johnson syndrome ([place]) 05/15/2012   Overview:  Overview:  Several years ago, allergic reaction to sulfa antibiotic Several years ago, allergic reaction to sulfa antibiotic     Surgical History: Past Surgical History:  Procedure Laterality Date  . ABDOMINAL HYSTERECTOMY    . BLADDER SURGERY    . cataract surgery    . ENDOVENOUS ABLATION SAPHENOUS VEIN W/ LASER    . NASAL SINUS SURGERY    . TOE AMPUTATION    . TONSILLECTOMY      Home Medications:  Allergies as of 05/26/2017      Reactions   Ciprofloxacin Anaphylaxis   Sulfamethoxazole-trimethoprim Other (See Comments), Rash   Went into [employer] Sydrome.   Augmentin [amoxicillin-pot Clavulanate] Hives   Levofloxacin Other (See Comments)   Losartan Potassium-hctz Other (See Comments)   Other reaction(s): Unknown   Nitrofurantoin Other (See Comments)   Sulfa Antibiotics Other (See Comments)   Kathreen Cosier Steven's Johnsons   Sulfasalazine    unknown   Levaquin [levofloxacin In D5w]       Medication List       Accurate as of 05/26/17  3:22 PM. Always use your most recent med list.          ALIGN 4 MG Caps Take by mouth. Reported on 02/26/2016   amiodarone 200 MG tablet Commonly known as:  PACERONE TAKE 1 TABLET (200 MG TOTAL) BY MOUTH ONCE DAILY.   amitriptyline 50 MG tablet Commonly known as:  ELAVIL TAKE 1 TABLET (50 MG TOTAL) BY MOUTH NIGHTLY.   atorvastatin 40 MG tablet Commonly known as:  LIPITOR Take 40 mg by mouth daily.   AZO-STANDARD PO Take by mouth.   calcium carbonate 1250 (500 Ca) MG tablet Commonly known as:  OS-CAL - dosed in mg of elemental calcium Take 1 tablet (500 mg of elemental calcium total) by mouth 3 (three) times daily.   ciprofloxacin 500 MG tablet Commonly known as:  CIPRO Take 500 mg by mouth 2 (two) times daily.   conjugated estrogens vaginal cream Commonly known as:  PREMARIN Place 1 Applicatorful vaginally  daily. Apply 0.67m (pea-sized amount)  just inside the vaginal introitus with a finger-tip every night for two weeks and then Monday, Wednesday and Friday nights.   estradiol 0.1 MG/GM vaginal cream Commonly known as:  ESTRACE Place 1/2 Gram (pea size amount) daily at urethral opening daily for 2 weeks, then use 3 times per week.   estradiol 2 MG vaginal ring Commonly known as:  ESTRING Place 2 mg vaginally every 3 (three) months. follow package directions   glucose blood test strip Reported on 12/18/2015   HUMALOG MIX 75/25 (75-25) 100 UNIT/ML Susp injection Generic drug:  insulin lispro protamine-lispro   lisinopril 20 MG tablet Commonly known as:  PRINIVIL,ZESTRIL Take 20 mg by mouth daily.   magnesium 30 MG tablet Take 30 mg by mouth 2 (two) times daily.  metFORMIN 1000 MG tablet Commonly known as:  GLUCOPHAGE Take 1,000 mg by mouth 2 (two) times daily.   metoCLOPramide 5 MG tablet Commonly known as:  REGLAN Take 1 tablet (5 mg total) by mouth 4 (four) times daily -  before meals and at bedtime.   metoprolol tartrate 50 MG tablet Commonly known as:  LOPRESSOR Take 1 tablet (50 mg total) by mouth 2 (two) times daily.   NOVOLOG MIX 70/30 FLEXPEN (70-30) 100 UNIT/ML FlexPen Generic drug:  insulin aspart protamine - aspart INJECT 20 UNITS SUBQ EVERY MORNING AND 18 UNITS DAILY AT 5PM   nystatin cream Commonly known as:  MYCOSTATIN Apply 1 application topically 2 (two) times daily.   pantoprazole 40 MG tablet Commonly known as:  PROTONIX Take 40 mg by mouth daily.   PHARMACIST CHOICE LANCETS Misc USE 2 TIMES DAILY FOR DIABETIC TESTING   polyethylene glycol packet Commonly known as:  MIRALAX / GLYCOLAX Take by mouth. Reported on 12/18/2015   tamsulosin 0.4 MG Caps capsule Commonly known as:  FLOMAX Take by mouth.   terconazole 0.8 % vaginal cream Commonly known as:  TERAZOL 3 Place 1 applicator vaginally 3 (three) times a week. 3 nights weekly        Allergies:  Allergies  Allergen Reactions  . Ciprofloxacin Anaphylaxis  . Sulfamethoxazole-Trimethoprim Other (See Comments) and Rash    Went into [employer].  . Augmentin [Amoxicillin-Pot Clavulanate] Hives  . Levofloxacin Other (See Comments)  . Losartan Potassium-Hctz Other (See Comments)    Other reaction(s): Unknown  . Nitrofurantoin Other (See Comments)  . Sulfa Antibiotics Other (See Comments)    Kathreen Cosier Steven's Johnsons  . Sulfasalazine     unknown  . Levaquin [Levofloxacin In D5w]     Family History: Family History  Problem Relation Age of Onset  . Diabetes Mellitus II Mother   . Heart disease Mother   . Lung cancer Father   . Diabetes Paternal Grandmother   . Diabetes Maternal Grandmother   . Kidney disease Neg Hx   . Prostate cancer Neg Hx   . Bladder Cancer Neg Hx     Social History:  reports that she has never smoked. She has never used smokeless tobacco. She reports that she does not drink alcohol or use drugs.  ROS: UROLOGY Frequent Urination?: Yes Hard to postpone urination?: Yes Burning/pain with urination?: Yes Get up at night to urinate?: Yes Leakage of urine?: Yes Urine stream starts and stops?: No Trouble starting stream?: Yes Do you have to strain to urinate?: Yes Blood in urine?: No Urinary tract infection?: No Sexually transmitted disease?: No Injury to kidneys or bladder?: No Painful intercourse?: No Weak stream?: Yes Currently pregnant?: No Vaginal bleeding?: No Last menstrual period?: n  Gastrointestinal Nausea?: No Vomiting?: No Indigestion/heartburn?: No Diarrhea?: No Constipation?: Yes  Constitutional Fever: No Night sweats?: No Weight loss?: Yes Fatigue?: Yes  Skin Skin rash/lesions?: No Itching?: Yes  Eyes Blurred vision?: No Double vision?: No  Ears/Nose/Throat Sore throat?: No Sinus problems?: No  Hematologic/Lymphatic Swollen glands?: No Easy bruising?:  Yes  Cardiovascular Leg swelling?: Yes Chest pain?: No  Respiratory Cough?: No Shortness of breath?: Yes  Endocrine Excessive thirst?: Yes  Musculoskeletal Back pain?: Yes Joint pain?: No  Neurological Headaches?: No Dizziness?: No  Psychologic Depression?: No Anxiety?: No  Physical Exam: BP 119/65   Pulse (!) 59   Ht _0  (1.651 m)   Wt 146 lb 4.8 oz (66.4 kg)   BMI 24.35 kg/m  Constitutional: Well nourished. Alert and oriented, No acute distress. HEENT: [place] AT, moist mucus membranes. Trachea midline, no masses. Cardiovascular: No clubbing, cyanosis, or edema. Respiratory: Normal respiratory effort, no increased work of breathing. GI: Abdomen is soft, non tender, non distended, no abdominal masses. Liver and spleen not palpable.  No hernias appreciated.  Stool sample for occult testing is not indicated.   GU: No CVA tenderness.  No bladder fullness or masses.  Atrophic external genitalia, normal pubic hair distribution, no lesions.  Normal urethral meatus, no lesions, no prolapse, no discharge.   No urethral masses, tenderness and/or tenderness. No bladder fullness, tenderness or masses. Pale vagina mucosa, poor estrogen effect, no discharge, no lesions, good pelvic support, pessary and estrogen ring in place.  Cervix, uterus and adnexa are surgically absent.   Anus and perineum are without rashes or lesions.    Skin: No rashes, bruises or suspicious lesions. Lymph: No cervical or inguinal adenopathy. Neurologic: Grossly intact, no focal deficits, moving all 4 extremities. Psychiatric: Normal mood and affect.  Laboratory Data Urinalysis 6-10 WBC's.  Many bacteria.  See EPIC.   Assessment & Plan:    1. Recurrent UTI's  - criteria for recurrent UTI has been met with 2 or more infections in 6 months or 3 or greater infections in one year   - Patient is instructed to increase their water intake until the urine is pale yellow or clear (10 to 12 cups daily)   -  probiotics (yogurt, oral pills or vaginal suppositories), take cranberry pills or drink the juice and Vitamin C 1,000 mg daily to acidify the urine should be added to their daily regimen   - avoid soaking in tubs and wipe front to back after urinating   - advised them to have CATH UA's for urinalysis and culture to prevent skin contamination of the specimen  - reviewed symptoms of UTI and advised not to have urine checked or be treated for UTI if not experiencing symptoms  - discussed antibiotic stewardship with the patient    - Today's cath UA was suspicious for infection - will restart gentamicin bladder installations 3 times weekly  - urine sent for culture    2. Vaginal atrophy  - continue Vagi fem ring in place  3. Incontinence  - failed PTNS and Myrbetriq  - manage conservatively at this time with depends                                        Return for RTC Friday for gentamicin instillation.  These notes generated with voice recognition software. I apologize for typographical errors.  Zara Council, [facility] [address] [place], [place] [address] [phone]

## 2017-05-26 ENCOUNTER — Encounter: Payer: Self-pay | Admitting: Urology

## 2017-05-26 ENCOUNTER — Ambulatory Visit (INDEPENDENT_AMBULATORY_CARE_PROVIDER_SITE_OTHER): Payer: Medicare Other | Admitting: Urology

## 2017-05-26 VITALS — BP 119/65 | HR 59 | Ht 65.0 in | Wt 146.3 lb

## 2017-05-26 DIAGNOSIS — N390 Urinary tract infection, site not specified: Secondary | ICD-10-CM | POA: Diagnosis not present

## 2017-05-26 DIAGNOSIS — R300 Dysuria: Secondary | ICD-10-CM | POA: Diagnosis not present

## 2017-05-26 DIAGNOSIS — N952 Postmenopausal atrophic vaginitis: Secondary | ICD-10-CM | POA: Diagnosis not present

## 2017-05-26 DIAGNOSIS — N3946 Mixed incontinence: Secondary | ICD-10-CM | POA: Diagnosis not present

## 2017-05-26 MED ORDER — ESTROGENS, CONJUGATED 0.625 MG/GM VA CREA: 1.0000 | TOPICAL_CREAM | Freq: Every day | VAGINAL | 12 refills | Status: DC

## 2017-05-26 MED ORDER — GENTAMICIN SULFATE 40 MG/ML IJ SOLN
80.0000 mg | Freq: Once | INTRAMUSCULAR | Status: AC
  Administered 2017-05-26: 80 mg

## 2017-05-26 NOTE — Progress Notes (Signed)
In and Out Catheterization  Patient is present today for a I & O catheterization for UA. Patient was cleaned and prepped in a sterile fashion with betadine and Lidocaine 2% jelly was instilled into the urethra.  A 14FR cath was inserted no complications were noted , 300ml of urine return was noted, urine was dark amber in color with a large amount of sediment present. A clean urine sample was collected for UA and culture. Bladder was drained  And catheter was removed with out difficulty.    Preformed by: Eligha BridegroomSarah Quanta Robertshaw, CMA   Bladder Irrigation  Due to high level of sediment and infection patient is present today for a bladder irrigation. Patient was cleaned and prepped in a sterile fashion. A 16Fr foley cath was placed and 10cc of sterile water was instilled into balloon. 700 ml of saline water was instilled and irrigated into the bladder with a 70ml Toomey syringe through the catheter.  After irrigation 50ml of sodium chloride with Gentamicin 80mg  was instilled into the bladder and 10cc of water was drained from balloon, catheter was removed. no complications were noted Patient tolerated well.   Preformed by: Eligha BridegroomSarah Dailin Sosnowski, CMA Additional notes/ Follow up: Friday for repeat irrigation and instillation of Gentamicin

## 2017-05-27 LAB — MICROSCOPIC EXAMINATION: Epithelial Cells (non renal): NONE SEEN /hpf (ref 0–10)

## 2017-05-27 LAB — URINALYSIS, COMPLETE
BILIRUBIN UA: NEGATIVE
Glucose, UA: NEGATIVE
KETONES UA: NEGATIVE
Nitrite, UA: NEGATIVE
SPEC GRAV UA: 1.01 (ref 1.005–1.030)
UUROB: 0.2 mg/dL (ref 0.2–1.0)

## 2017-05-28 ENCOUNTER — Ambulatory Visit (INDEPENDENT_AMBULATORY_CARE_PROVIDER_SITE_OTHER): Payer: Medicare Other

## 2017-05-28 VITALS — BP 116/62 | HR 54 | Ht 65.0 in | Wt 146.4 lb

## 2017-05-28 DIAGNOSIS — N390 Urinary tract infection, site not specified: Secondary | ICD-10-CM

## 2017-05-28 MED ORDER — GENTAMICIN SULFATE 40 MG/ML IJ SOLN
80.0000 mg | Freq: Once | INTRAMUSCULAR | Status: AC
  Administered 2017-05-28: 80 mg

## 2017-05-28 NOTE — Progress Notes (Signed)
Bladder Irrigation  Due to recurrent uti patient is present today for a bladder irrigation. Patient was cleaned and prepped in a sterile fashion.  500ml of saline/sterile water was instilled and irrigated into the bladder with a 70ml Toomey syringe through the catheter in place.Patient tolerated well.   Performed by: C. Rana SnareLowe, CMA    Gentamicin Installation   Due to recurrent UTI patient is present today for a Gentamicin installation. Patient was cleaned and prepped in a sterile fashion. 52ML of saline/Gent was instilled and irrigated into the bladder with a 60ML Toomey syringe through the catheter in place.  After irrigation catheter was removed and patient was instructed

## 2017-05-30 LAB — CULTURE, URINE COMPREHENSIVE

## 2017-05-31 ENCOUNTER — Telehealth: Payer: Self-pay

## 2017-05-31 NOTE — Telephone Encounter (Signed)
-----   Message from Harle BattiestShannon A McGowan, PA-C sent at 05/30/2017  9:12 PM EDT ----- Please let the patient know that her urine culture was negative, but we will still continue with the bladder instillations.

## 2017-05-31 NOTE — Telephone Encounter (Signed)
Gave pt results and instructions. Patient verbalized understanding. She would like to know when will she get her next bladder instillation.

## 2017-06-13 NOTE — Telephone Encounter (Signed)
Mrs. Pamela [name] needs to have bladder instillations on Monday, Wednesday and Fridays.

## 2017-06-16 NOTE — Telephone Encounter (Signed)
Called and spoke with patient and let her know she is supposed to have instillations 3 times a week on Mondays, Wednesdays and Fridays. Patient states her and her husband don't drive anymore and she has to depend on transportation. I let her know she can make an appointment and verify transportation for that day. She wanted to make sure she has transportation first I let her know that we may not be able to accommodate her appointment that way if we are fully booked. Patient states she will call back and make appointment. OK with plan.

## 2017-08-09 ENCOUNTER — Ambulatory Visit: Payer: Medicare Other

## 2017-08-12 ENCOUNTER — Ambulatory Visit (INDEPENDENT_AMBULATORY_CARE_PROVIDER_SITE_OTHER): Payer: Medicare Other

## 2017-08-12 DIAGNOSIS — N390 Urinary tract infection, site not specified: Secondary | ICD-10-CM | POA: Diagnosis not present

## 2017-08-12 NOTE — Progress Notes (Signed)
Patient was to have instillations in July and has had trouble with getting rides and has had to reschedule multiple times. She presents today for: Bladder Instillation & Irrigation Patient was cleaned and prepped in a sterile fashion with betadine and Lidocaine 2% jelly was instilled into the urethra.  A 14FR cath was inserted no complications were noted , 100ml of urine return was noted, urine was light yellow and cloudy in color.Bladder was drained  Due to recurrent uti patient is present today for a bladder irrigation. Patient was cleaned and prepped in a sterile fashion.  1000ml of saline/sterile water was instilled and irrigated into the bladder with a 70ml Toomey syringe through the catheter in place.Patient tolerated well.   Performed by: Eligha BridegroomSarah Watts, CMA    Gentamicin Installation  Due to recurrent UTI patient is present today for a Gentamicin installation. Patient was cleaned and prepped in a sterile fashion. 52ML of saline/Gent was instilled and irrigated into the bladder with a 60ML Toomey syringe through the catheter in place.  After irrigation catheter was removed and patient was instructed to hold in bladder as long as possible  Patient will return next week on Monday, Wednesday and Friday for gentamicin Installations. Urine today was sent for culture

## 2017-08-13 LAB — URINALYSIS, COMPLETE
Bilirubin, UA: NEGATIVE
Glucose, UA: NEGATIVE
Nitrite, UA: NEGATIVE
Specific Gravity, UA: 1.025 (ref 1.005–1.030)
Urobilinogen, Ur: 0.2 mg/dL (ref 0.2–1.0)
pH, UA: 5.5 (ref 5.0–7.5)

## 2017-08-13 LAB — MICROSCOPIC EXAMINATION: WBC, UA: >30 /hpf — ABNORMAL HIGH (ref 0–?)

## 2017-08-16 ENCOUNTER — Telehealth: Payer: Self-pay

## 2017-08-16 ENCOUNTER — Ambulatory Visit (INDEPENDENT_AMBULATORY_CARE_PROVIDER_SITE_OTHER): Payer: Medicare Other

## 2017-08-16 VITALS — BP 145/67 | HR 57 | Ht 65.0 in | Wt 144.3 lb

## 2017-08-16 DIAGNOSIS — N390 Urinary tract infection, site not specified: Secondary | ICD-10-CM

## 2017-08-16 LAB — CULTURE, URINE COMPREHENSIVE

## 2017-08-16 MED ORDER — GENTAMICIN SULFATE 40 MG/ML IJ SOLN: 80.0000 mg | Freq: Once | INTRAMUSCULAR | Status: DC

## 2017-08-16 NOTE — Telephone Encounter (Signed)
-----   Message from Harle Battiest, PA-C sent at 08/16/2017 12:17 PM EDT ----- Patient has multiple drug allergies and her memory is declining.  I would like to continue gentamycin bladder instillations at this time.  Please have her come in on Wednesday and Friday.

## 2017-08-16 NOTE — Progress Notes (Signed)
Bladder Irrigation  Due to recurrent UTI patient is present today for a bladder irrigation. Patient was cleaned and prepped in a sterile fashion. 1000 ml of saline/sterile water was instilled and irrigated into the bladder with a 70ml Toomey syringe through the catheter in place.  After irrigation urine flow was noted no complications were noted catheter is now draining fine. Patient tolerated well.   Preformed by: Rupert Stacks, LPN    Due to recurrent UTI patient is present today for a Gentamicin installation. Patient was cleaned and prepped in a sterile fashion. of saline/Gent was instilled and irrigated into the bladder with a .Marland KitchenMarland KitchenML Toomey syringe through the catheter in place.  After irrigation urine flow was noted. Balloon was drained and catheter was removed.  Blood pressure (!) 145/67, pulse (!) 57, height  (1.651 m), weight 144 lb 4.8 oz (65.5 kg).

## 2017-08-16 NOTE — Telephone Encounter (Signed)
Pt came into office today for gentamycin installation and bladder irrigation. Pt will RTC Wednesday and Thursday.

## 2017-08-18 ENCOUNTER — Ambulatory Visit (INDEPENDENT_AMBULATORY_CARE_PROVIDER_SITE_OTHER): Payer: Medicare Other

## 2017-08-18 VITALS — BP 111/64 | HR 53 | Ht 65.0 in | Wt 146.3 lb

## 2017-08-18 DIAGNOSIS — N390 Urinary tract infection, site not specified: Secondary | ICD-10-CM

## 2017-08-18 NOTE — Progress Notes (Signed)
Gentamycin Instillation  Due to recurrent UTI patient is present today for a Gentamycin Treatment.  Patient was cleaned and prepped in a sterile fashion with betadine and lidocaine 2% jelly was instilled into the urethra.  A 14 FR catheter was inserted, urine return was noted , urine was yellow in color.  Instilled a solution consisting of 50mL sodium chloride and 2mL gentamycin. The catheter was then removed. Patient tolerated well, no complications were noted.   Performed by: Rupert Stacks, LPN   Follow up/ Additional Notes: Pt will RTC on Friday.  Pt stated that she fell Sunday and Monday and hit her head. Pt then mentioned that the fall has started affecting her balance. Pt was advised to call her PCP and make them aware of the fall and balance issues. Pt voiced understanding.   Blood pressure 111/64, pulse (!) 53, height  (1.651 m), weight 146 lb 4.8 oz (66.4 kg).

## 2017-08-20 ENCOUNTER — Ambulatory Visit (INDEPENDENT_AMBULATORY_CARE_PROVIDER_SITE_OTHER): Payer: Medicare Other

## 2017-08-20 VITALS — BP 149/68 | HR 57 | Ht 65.0 in | Wt 144.3 lb

## 2017-08-20 DIAGNOSIS — N390 Urinary tract infection, site not specified: Secondary | ICD-10-CM | POA: Diagnosis not present

## 2017-08-20 MED ORDER — GENTAMICIN SULFATE 40 MG/ML IJ SOLN
80.0000 mg | Freq: Once | INTRAMUSCULAR | Status: AC
  Administered 2017-08-20: 80 mg via INTRAMUSCULAR

## 2017-08-20 NOTE — Progress Notes (Addendum)
Bladder Gentamicin Instillation  Due to recurrent UTI patient is present today for a Gentamicin Treatment.  Patient was cleaned and prepped in a sterile fashion with betadine and lidocaine 2% jelly was instilled into the urethra.  A 16FR catheter was inserted, urine return was noted , urine was yellow in color.  Instilled a solution of 2ml Gentamycin and 50ml of Sodium Chloride. The catheter was then removed. Patient tolerated well, no complications were noted.   Performed by: C. Rana Snare, CMA

## 2017-08-20 NOTE — Addendum Note (Signed)
Addended by: Ellin Goodie on: 08/20/2017 02:29 PM   Modules accepted: Orders

## 2017-08-23 ENCOUNTER — Ambulatory Visit (INDEPENDENT_AMBULATORY_CARE_PROVIDER_SITE_OTHER): Payer: Medicare Other

## 2017-08-23 VITALS — BP 122/67 | HR 56 | Ht 65.0 in | Wt 144.2 lb

## 2017-08-23 DIAGNOSIS — N390 Urinary tract infection, site not specified: Secondary | ICD-10-CM | POA: Diagnosis not present

## 2017-08-23 MED ORDER — GENTAMICIN SULFATE 40 MG/ML IJ SOLN
80.0000 mg | Freq: Once | INTRAMUSCULAR | Status: AC
  Administered 2017-08-23: 80 mg via INTRAMUSCULAR

## 2017-08-23 NOTE — Progress Notes (Signed)
Gentamicin Instillation  Due to recurrent uti(s) patient is present today for a Gentamicin Instillation.  Patient was cleaned and prepped in a sterile fashion with betadine and lidocaine 2% jelly was instilled into the urethra.  A 14 FR catheter was inserted, urine return was noted 200 ml, urine was yellow in color.  Instilled a solution consisting of 50ml of Sodium Bicarb, 2 ml of Gentamicin The catheter was then removed. Patient tolerated well, no complications were noted.   Performed by: C. Rana Snare, CMA

## 2017-08-25 ENCOUNTER — Ambulatory Visit (INDEPENDENT_AMBULATORY_CARE_PROVIDER_SITE_OTHER): Payer: Medicare Other

## 2017-08-25 VITALS — BP 152/82 | HR 65 | Ht 65.0 in | Wt 143.0 lb

## 2017-08-25 DIAGNOSIS — N390 Urinary tract infection, site not specified: Secondary | ICD-10-CM

## 2017-08-25 MED ORDER — GENTAMICIN SULFATE 40 MG/ML IJ SOLN
80.0000 mg | Freq: Once | INTRAMUSCULAR | Status: AC
  Administered 2017-08-25: 80 mg via INTRAMUSCULAR

## 2017-08-25 NOTE — Progress Notes (Signed)
Due to recurrent UTI patient is present today for a Gentamicin installation. Patient was cleaned and prepped in a sterile fashion. of saline/Gent was instilled and irrigated into the bladder with a Toomey syringe through the catheter in place.  After irrigation urine flow was noted. Balloon was drained and catheter was removed after.  Blood pressure (!) 152/82, pulse 65, height  (1.651 m), weight 143 lb (64.9 kg).

## 2017-08-27 ENCOUNTER — Ambulatory Visit: Payer: Medicare Other

## 2017-08-30 ENCOUNTER — Ambulatory Visit (INDEPENDENT_AMBULATORY_CARE_PROVIDER_SITE_OTHER): Payer: Medicare Other

## 2017-08-30 VITALS — BP 126/66 | HR 55 | Ht 65.0 in | Wt 137.4 lb

## 2017-08-30 DIAGNOSIS — N390 Urinary tract infection, site not specified: Secondary | ICD-10-CM

## 2017-08-30 MED ORDER — GENTAMICIN SULFATE 40 MG/ML IJ SOLN
80.0000 mg | Freq: Once | INTRAMUSCULAR | Status: AC
  Administered 2017-08-30: 80 mg via INTRAMUSCULAR

## 2017-08-30 NOTE — Progress Notes (Signed)
Bladder Gentamycin Installation  Due to recurrent UTI patient is present today for a bladder irrigation. Patient was cleaned and prepped in a sterile fashion. 52 ml of gentamycin/sodium chloride was instilled and irrigated into the bladder with a 70ml Toomey syringe through the catheter in place.  After irrigation urine flow was noted no complications were noted catheter is now draining fine. Patient tolerated well.   Preformed by: Rupert Stacks, LPN   Blood pressure 126/66, pulse (!) 55, height  (1.651 m), weight 137 lb 6.4 oz (62.3 kg).

## 2017-09-01 ENCOUNTER — Ambulatory Visit (INDEPENDENT_AMBULATORY_CARE_PROVIDER_SITE_OTHER): Payer: Medicare Other

## 2017-09-01 VITALS — BP 125/71 | HR 60 | Ht 65.0 in | Wt 138.0 lb

## 2017-09-01 DIAGNOSIS — N390 Urinary tract infection, site not specified: Secondary | ICD-10-CM

## 2017-09-01 MED ORDER — GENTAMICIN SULFATE 40 MG/ML IJ SOLN: 80.0000 mg | Freq: Once | INTRAMUSCULAR | Status: DC

## 2017-09-01 NOTE — Progress Notes (Signed)
Gentamycin Instillation  Due to recurrent UTI patient is present today for a Gentamycin Treatment.  Patient was cleaned and prepped in a sterile fashion with betadine and lidocaine 2% jelly was instilled into the urethra.  A 14 FR catheter was inserted, urine return was noted , urine was orange in color.  Instilled a solution consisting of 2mL of gentamycin 50mL sodium chloride. The catheter was then removed. Patient tolerated well, no complications were noted.   Performed by: Rupert Stacks, LPN  Blood pressure 125/71, pulse 60, height  (1.651 m), weight 138 lb (62.6 kg).

## 2017-09-03 ENCOUNTER — Ambulatory Visit (INDEPENDENT_AMBULATORY_CARE_PROVIDER_SITE_OTHER): Payer: Medicare Other

## 2017-09-03 VITALS — BP 126/62 | HR 54 | Ht 65.0 in | Wt 138.0 lb

## 2017-09-03 DIAGNOSIS — N390 Urinary tract infection, site not specified: Secondary | ICD-10-CM | POA: Diagnosis not present

## 2017-09-03 MED ORDER — GENTAMICIN SULFATE 40 MG/ML IJ SOLN
80.0000 mg | Freq: Once | INTRAMUSCULAR | Status: AC
  Administered 2017-09-03: 80 mg via INTRAMUSCULAR

## 2017-09-03 NOTE — Progress Notes (Signed)
Gentamicin Instillation  Due to recurrent UTI patient is present today for a Gentamicin Instillation.  Patient was cleaned and prepped in a sterile fashion with betadine.  A 16 FR catheter w/ lidocaine 2% jelly was inserted, urine return was noted , urine was yellow in color.  Instilled a solution consisting of 50ml of saline and 2 ml of Gentamicin. The catheter was then removed. Patient tolerated well, no complications were noted.   Performed by: C. Rana Snare, CMA   Follow up/ Additional Notes: 09/06/2017

## 2017-09-06 ENCOUNTER — Ambulatory Visit: Payer: Self-pay

## 2017-09-08 ENCOUNTER — Ambulatory Visit (INDEPENDENT_AMBULATORY_CARE_PROVIDER_SITE_OTHER): Payer: Medicare Other

## 2017-09-08 VITALS — BP 96/57 | HR 51 | Ht 65.0 in | Wt 146.0 lb

## 2017-09-08 DIAGNOSIS — N390 Urinary tract infection, site not specified: Secondary | ICD-10-CM | POA: Diagnosis not present

## 2017-09-08 MED ORDER — GENTAMICIN SULFATE 40 MG/ML IJ SOLN
80.0000 mg | Freq: Once | INTRAMUSCULAR | Status: AC
  Administered 2017-09-08: 80 mg via INTRAMUSCULAR

## 2017-09-08 NOTE — Progress Notes (Signed)
Due to recurrent UTI patient is present today for a Gentamicin installation. Patient was cleaned and prepped in a sterile fashion. of saline/Gent was instilled and irrigated into the bladder with a Toomey syringe through the catheter in place.  After irrigation urine flow was noted. Balloon was drained and catheter was removed after 2 minutes.   Preformed by: Doree Barthel, CMA

## 2017-09-10 ENCOUNTER — Ambulatory Visit: Payer: Self-pay

## 2017-09-13 ENCOUNTER — Ambulatory Visit: Payer: Self-pay

## 2017-09-15 ENCOUNTER — Ambulatory Visit (INDEPENDENT_AMBULATORY_CARE_PROVIDER_SITE_OTHER): Payer: Medicare Other

## 2017-09-15 VITALS — BP 122/64 | HR 57 | Ht 65.0 in | Wt 145.1 lb

## 2017-09-15 DIAGNOSIS — N390 Urinary tract infection, site not specified: Secondary | ICD-10-CM

## 2017-09-15 MED ORDER — GENTAMICIN SULFATE 40 MG/ML IJ SOLN
80.0000 mg | Freq: Once | INTRAMUSCULAR | Status: AC
  Administered 2017-09-15: 80 mg

## 2017-09-15 NOTE — Progress Notes (Signed)
Due to recurrent UTIs patient is present today for a Gentamicin installation. Patient was cleaned and prepped in a sterile fashion. 52mL of saline/Gent was instilled and irrigated into the bladder with a 60mL Toomey syringe through the catheter in place.    Blood pressure 122/64, pulse (!) 57, height 5\' 5"  (1.651 m), weight 145 lb 1.6 oz (65.8 kg).

## 2017-09-17 ENCOUNTER — Ambulatory Visit: Payer: Self-pay

## 2017-09-22 ENCOUNTER — Ambulatory Visit: Payer: Medicare Other

## 2017-09-29 ENCOUNTER — Ambulatory Visit: Payer: Medicare Other

## 2017-10-06 ENCOUNTER — Ambulatory Visit (INDEPENDENT_AMBULATORY_CARE_PROVIDER_SITE_OTHER): Payer: Medicare Other

## 2017-10-06 VITALS — BP 149/68 | HR 89 | Ht 65.0 in | Wt 147.0 lb

## 2017-10-06 DIAGNOSIS — N3020 Other chronic cystitis without hematuria: Secondary | ICD-10-CM

## 2017-10-06 NOTE — Progress Notes (Signed)
Bladder Irrigation  Due to recurrent UTI patient is present today for a bladder irrigation. Patient was cleaned and prepped in a sterile fashion. 500 ml of saline/sterile water was instilled and irrigated into the bladder with a 70ml Toomey syringe through the catheter in place.  After irrigation urine flow was noted no complications were noted catheter is now draining fine. Patient tolerated well.   Preformed by: Rupert Stackshelsea Watkins, LPN  Additional notes/ Follow up: 1 week  Blood pressure (!) 149/68, pulse 89, height 5\' 5"  (1.651 m), weight 147 lb (66.7 kg).

## 2017-10-13 ENCOUNTER — Ambulatory Visit (INDEPENDENT_AMBULATORY_CARE_PROVIDER_SITE_OTHER): Payer: Medicare Other

## 2017-10-13 VITALS — BP 121/64 | HR 57 | Ht 65.0 in | Wt 148.0 lb

## 2017-10-13 DIAGNOSIS — N390 Urinary tract infection, site not specified: Secondary | ICD-10-CM

## 2017-10-13 NOTE — Progress Notes (Signed)
Bladder Irrigation  Due to recurrent UTI patient is present today for a bladder irrigation. Patient was cleaned and prepped in a sterile fashion. 500 ml of saline/sterile water was instilled and irrigated into the bladder with a 70ml Toomey syringe through the catheter in place.  After irrigation urine flow was noted no complications were noted catheter is now draining fine.  Patient tolerated well.   Preformed by: Rupert Stackshelsea Watkins, LPN  Additional notes/ Follow up: 1 week  Blood pressure 121/64, pulse (!) 57, height 5\' 5"  (1.651 m), weight 148 lb (67.1 kg).

## 2017-10-20 ENCOUNTER — Ambulatory Visit (INDEPENDENT_AMBULATORY_CARE_PROVIDER_SITE_OTHER): Payer: Medicare Other

## 2017-10-20 VITALS — BP 143/80 | HR 83 | Ht 67.0 in | Wt 148.0 lb

## 2017-10-20 DIAGNOSIS — N390 Urinary tract infection, site not specified: Secondary | ICD-10-CM | POA: Diagnosis not present

## 2017-10-20 NOTE — Progress Notes (Signed)
Bladder Irrigation  Due to recurrent UTI patient is present today for a bladder irrigation. Patient was cleaned and prepped in a sterile fashion. 500 ml of saline/sterile water was instilled and irrigated into the bladder with a 70ml Toomey syringe through the catheter in place.  After irrigation urine flow was noted no complications were noted catheter is now draining fine. Patient tolerated well.   Preformed by: Rupert Stackshelsea Novak Stgermaine, LPN  Additional notes/ Follow up: 1 week  Blood pressure (!) 143/80, pulse 83, height 5\' 7"  (1.702 m), weight 148 lb (67.1 kg).

## 2017-10-27 ENCOUNTER — Ambulatory Visit: Payer: Medicare Other

## 2017-11-02 ENCOUNTER — Ambulatory Visit (INDEPENDENT_AMBULATORY_CARE_PROVIDER_SITE_OTHER): Payer: Medicare Other

## 2017-11-02 VITALS — BP 120/67 | HR 82 | Ht 67.0 in | Wt 149.0 lb

## 2017-11-02 DIAGNOSIS — N390 Urinary tract infection, site not specified: Secondary | ICD-10-CM

## 2017-11-02 MED ORDER — GENTAMICIN SULFATE 40 MG/ML IJ SOLN
80.0000 mg | Freq: Once | INTRAMUSCULAR | Status: AC
  Administered 2017-11-02: 80 mg via INTRAPERITONEAL

## 2017-11-02 NOTE — Progress Notes (Signed)
Gentamycin Instillation  Due to recurrent UTI patient is present today for a Gentamycin Treatment.  Patient was cleaned and prepped in a sterile fashion with betadine and lidocaine 2% jelly was instilled into the urethra.  A 14 FR catheter was inserted, urine return was noted 700ml, urine was yellow in color.  Instilled a solution consisting of 50mL normal saline and 2mL gentamycin.The catheter was then removed. Patient tolerated well, no complications were noted.   Performed by: Rupert Stackshelsea Watkins, LPN   Follow up/ Additional Notes: 1 week  Blood pressure 120/67, pulse 82, height 5\' 7"  (1.702 m), weight 149 lb (67.6 kg).

## 2017-11-03 ENCOUNTER — Ambulatory Visit: Payer: Medicare Other

## 2017-11-10 ENCOUNTER — Ambulatory Visit: Payer: Medicare Other

## 2017-11-16 ENCOUNTER — Ambulatory Visit (INDEPENDENT_AMBULATORY_CARE_PROVIDER_SITE_OTHER): Payer: Medicare Other

## 2017-11-16 VITALS — BP 127/62 | HR 58 | Ht 65.0 in | Wt 145.0 lb

## 2017-11-16 DIAGNOSIS — N390 Urinary tract infection, site not specified: Secondary | ICD-10-CM

## 2017-11-16 MED ORDER — GENTAMICIN SULFATE 40 MG/ML IJ SOLN
80.0000 mg | Freq: Once | INTRAMUSCULAR | Status: AC
  Administered 2017-11-16: 80 mg via INTRAPERITONEAL

## 2017-11-16 NOTE — Progress Notes (Signed)
Gentamycin Installation   Due to recurrent UTI patient is present today for a Gentamycin Treatment.  Patient was cleaned and prepped in a sterile fashion with betadine and lidocaine 2% jelly was instilled into the urethra.  A 14 FR catheter was inserted, urine return was noted 500ml, urine was yellow in color.  Instilled a solution consisting of 50mL sodium chloride and 2mL gentamycin. The catheter was then removed. Patient tolerated well, no complications were noted.   Performed by: Rupert Stackshelsea Watkins, LPN   Follow up/ Additional Notes: 1 week  Blood pressure 127/62, pulse (!) 58, height 5\' 5"  (1.651 m), weight 145 lb (65.8 kg).

## 2017-11-24 ENCOUNTER — Ambulatory Visit: Payer: Medicare Other

## 2017-11-28 ENCOUNTER — Encounter: Payer: Self-pay | Admitting: Emergency Medicine

## 2017-11-28 ENCOUNTER — Inpatient Hospital Stay
Admission: EM | Admit: 2017-11-28 | Discharge: 2017-11-30 | DRG: 683 | Disposition: A | Discharge status: Home Health | Payer: Medicare Other | Attending: Internal Medicine | Admitting: Internal Medicine

## 2017-11-28 ENCOUNTER — Other Ambulatory Visit: Payer: Self-pay

## 2017-11-28 DIAGNOSIS — B3749 Other urogenital candidiasis: Secondary | ICD-10-CM | POA: Diagnosis present

## 2017-11-28 DIAGNOSIS — E875 Hyperkalemia: Secondary | ICD-10-CM | POA: Diagnosis present

## 2017-11-28 DIAGNOSIS — Z881 Allergy status to other antibiotic agents status: Secondary | ICD-10-CM | POA: Diagnosis not present

## 2017-11-28 DIAGNOSIS — N136 Pyonephrosis: Secondary | ICD-10-CM | POA: Diagnosis present

## 2017-11-28 DIAGNOSIS — E7800 Pure hypercholesterolemia, unspecified: Secondary | ICD-10-CM | POA: Diagnosis present

## 2017-11-28 DIAGNOSIS — R338 Other retention of urine: Secondary | ICD-10-CM | POA: Diagnosis not present

## 2017-11-28 DIAGNOSIS — Z888 Allergy status to other drugs, medicaments and biological substances status: Secondary | ICD-10-CM

## 2017-11-28 DIAGNOSIS — I5032 Chronic diastolic (congestive) heart failure: Secondary | ICD-10-CM | POA: Diagnosis present

## 2017-11-28 DIAGNOSIS — K219 Gastro-esophageal reflux disease without esophagitis: Secondary | ICD-10-CM | POA: Diagnosis present

## 2017-11-28 DIAGNOSIS — Z880 Allergy status to penicillin: Secondary | ICD-10-CM | POA: Diagnosis not present

## 2017-11-28 DIAGNOSIS — N1330 Unspecified hydronephrosis: Secondary | ICD-10-CM

## 2017-11-28 DIAGNOSIS — I130 Hypertensive heart and chronic kidney disease with heart failure and stage 1 through stage 4 chronic kidney disease, or unspecified chronic kidney disease: Secondary | ICD-10-CM | POA: Diagnosis present

## 2017-11-28 DIAGNOSIS — E1122 Type 2 diabetes mellitus with diabetic chronic kidney disease: Secondary | ICD-10-CM | POA: Diagnosis present

## 2017-11-28 DIAGNOSIS — N390 Urinary tract infection, site not specified: Secondary | ICD-10-CM | POA: Diagnosis present

## 2017-11-28 DIAGNOSIS — R339 Retention of urine, unspecified: Secondary | ICD-10-CM | POA: Diagnosis present

## 2017-11-28 DIAGNOSIS — N179 Acute kidney failure, unspecified: Principal | ICD-10-CM | POA: Diagnosis present

## 2017-11-28 DIAGNOSIS — Z794 Long term (current) use of insulin: Secondary | ICD-10-CM | POA: Diagnosis not present

## 2017-11-28 DIAGNOSIS — Z79899 Other long term (current) drug therapy: Secondary | ICD-10-CM

## 2017-11-28 DIAGNOSIS — N183 Chronic kidney disease, stage 3 (moderate): Secondary | ICD-10-CM | POA: Diagnosis present

## 2017-11-28 DIAGNOSIS — I480 Paroxysmal atrial fibrillation: Secondary | ICD-10-CM | POA: Diagnosis present

## 2017-11-28 DIAGNOSIS — E860 Dehydration: Secondary | ICD-10-CM | POA: Diagnosis present

## 2017-11-28 DIAGNOSIS — N3020 Other chronic cystitis without hematuria: Secondary | ICD-10-CM | POA: Diagnosis not present

## 2017-11-28 LAB — COMPREHENSIVE METABOLIC PANEL
ALBUMIN: 3.1 g/dL — AB (ref 3.5–5.0)
ALT: 15 U/L (ref 14–54)
ANION GAP: 12 (ref 5–15)
AST: 27 U/L (ref 15–41)
Alkaline Phosphatase: 68 U/L (ref 38–126)
BILIRUBIN TOTAL: 0.9 mg/dL (ref 0.3–1.2)
BUN: 71 mg/dL — ABNORMAL HIGH (ref 6–20)
CO2: 19 mmol/L — ABNORMAL LOW (ref 22–32)
Calcium: 9 mg/dL (ref 8.9–10.3)
Chloride: 100 mmol/L — ABNORMAL LOW (ref 101–111)
Creatinine, Ser: 3.77 mg/dL — ABNORMAL HIGH (ref 0.44–1.00)
GFR, EST AFRICAN AMERICAN: 12 mL/min — AB (ref >60)
GFR, EST NON AFRICAN AMERICAN: 10 mL/min — AB (ref >60)
GLUCOSE: 207 mg/dL — AB (ref 65–99)
POTASSIUM: 5.6 mmol/L — AB (ref 3.5–5.1)
Sodium: 131 mmol/L — ABNORMAL LOW (ref 135–145)
TOTAL PROTEIN: 5.8 g/dL — AB (ref 6.5–8.1)

## 2017-11-28 LAB — CBC WITH DIFFERENTIAL/PLATELET
BASOS ABS: 0 10*3/uL (ref 0–0.1)
Basophils Relative: 0 %
Eosinophils Absolute: 0 10*3/uL (ref 0–0.7)
Eosinophils Relative: 0 %
HEMATOCRIT: 33.6 % — AB (ref 35.0–47.0)
Hemoglobin: 11 g/dL — ABNORMAL LOW (ref 12.0–16.0)
LYMPHS PCT: 5 %
Lymphs Abs: 0.4 10*3/uL — ABNORMAL LOW (ref 1.0–3.6)
MCH: 31.9 pg (ref 26.0–34.0)
MCHC: 32.6 g/dL (ref 32.0–36.0)
MCV: 97.7 fL (ref 80.0–100.0)
Monocytes Absolute: 0.8 10*3/uL (ref 0.2–0.9)
Monocytes Relative: 11 %
NEUTROS ABS: 6.4 10*3/uL (ref 1.4–6.5)
NEUTROS PCT: 84 %
Platelets: 220 10*3/uL (ref 150–440)
RBC: 3.44 MIL/uL — AB (ref 3.80–5.20)
RDW: 14 % (ref 11.5–14.5)
WBC: 7.7 10*3/uL (ref 3.6–11.0)

## 2017-11-28 LAB — URINALYSIS, COMPLETE (UACMP) WITH MICROSCOPIC
BILIRUBIN URINE: NEGATIVE
GLUCOSE, UA: NEGATIVE mg/dL
Ketones, ur: NEGATIVE mg/dL
NITRITE: NEGATIVE
PROTEIN: 30 mg/dL — AB
SPECIFIC GRAVITY, URINE: 1.013 (ref 1.005–1.030)
pH: 5 (ref 5.0–8.0)

## 2017-11-28 LAB — GLUCOSE, CAPILLARY: Glucose-Capillary: 99 mg/dL (ref 65–99)

## 2017-11-28 MED ORDER — AMIODARONE HCL 200 MG PO TABS
200.0000 mg | ORAL_TABLET | Freq: Every day | ORAL | Status: DC
  Administered 2017-11-29 – 2017-11-30 (×2): 200 mg via ORAL
  Filled 2017-11-28 (×3): qty 1

## 2017-11-28 MED ORDER — FLUCONAZOLE 100MG IVPB
100.0000 mg | INTRAVENOUS | Status: DC
  Administered 2017-11-28 – 2017-11-29 (×2): 100 mg via INTRAVENOUS
  Filled 2017-11-28 (×3): qty 50

## 2017-11-28 MED ORDER — SODIUM CHLORIDE 0.9 % IV SOLN
INTRAVENOUS | Status: DC
  Administered 2017-11-28 – 2017-11-29 (×3): via INTRAVENOUS

## 2017-11-28 MED ORDER — POLYETHYLENE GLYCOL 3350 17 G PO PACK: 17.0000 g | PACK | Freq: Every day | ORAL | Status: DC | PRN

## 2017-11-28 MED ORDER — AMITRIPTYLINE HCL 50 MG PO TABS
50.0000 mg | ORAL_TABLET | Freq: Every day | ORAL | Status: DC
  Administered 2017-11-28 – 2017-11-29 (×2): 50 mg via ORAL
  Filled 2017-11-28 (×3): qty 1

## 2017-11-28 MED ORDER — HEPARIN SODIUM (PORCINE) 5000 UNIT/ML IJ SOLN
5000.0000 [IU] | Freq: Three times a day (TID) | INTRAMUSCULAR | Status: DC
  Administered 2017-11-28 – 2017-11-30 (×5): 5000 [IU] via SUBCUTANEOUS
  Filled 2017-11-28 (×5): qty 1

## 2017-11-28 MED ORDER — SODIUM POLYSTYRENE SULFONATE 15 GM/60ML PO SUSP
30.0000 g | Freq: Once | ORAL | Status: AC
  Administered 2017-11-28: 15 g via ORAL
  Filled 2017-11-28: qty 120

## 2017-11-28 MED ORDER — ACETAMINOPHEN 650 MG RE SUPP: 650.0000 mg | Freq: Four times a day (QID) | RECTAL | Status: DC | PRN

## 2017-11-28 MED ORDER — METOPROLOL TARTRATE 50 MG PO TABS
50.0000 mg | ORAL_TABLET | Freq: Two times a day (BID) | ORAL | Status: DC
  Administered 2017-11-28 – 2017-11-30 (×4): 50 mg via ORAL
  Filled 2017-11-28 (×4): qty 1

## 2017-11-28 MED ORDER — INSULIN ASPART 100 UNIT/ML ~~LOC~~ SOLN
0.0000 [IU] | Freq: Three times a day (TID) | SUBCUTANEOUS | Status: DC
Start: 2017-11-29 — End: 2017-11-30
  Administered 2017-11-29: 2 [IU] via SUBCUTANEOUS
  Administered 2017-11-30: 5 [IU] via SUBCUTANEOUS
  Filled 2017-11-28 (×2): qty 1

## 2017-11-28 MED ORDER — INSULIN ASPART 100 UNIT/ML ~~LOC~~ SOLN: 0.0000 [IU] | Freq: Every day | SUBCUTANEOUS | Status: DC

## 2017-11-28 MED ORDER — TAMSULOSIN HCL 0.4 MG PO CAPS
0.4000 mg | ORAL_CAPSULE | Freq: Every day | ORAL | Status: DC
  Administered 2017-11-28 – 2017-11-30 (×3): 0.4 mg via ORAL
  Filled 2017-11-28 (×4): qty 1

## 2017-11-28 MED ORDER — ACETAMINOPHEN 325 MG PO TABS
650.0000 mg | ORAL_TABLET | Freq: Four times a day (QID) | ORAL | Status: DC | PRN
  Administered 2017-11-29: 650 mg via ORAL
  Filled 2017-11-28: qty 2

## 2017-11-28 MED ORDER — HYDROCODONE-ACETAMINOPHEN 5-325 MG PO TABS: 1.0000 | ORAL_TABLET | ORAL | Status: DC | PRN

## 2017-11-28 MED ORDER — DEXTROSE 5 % IV SOLN
1.0000 g | INTRAVENOUS | Status: DC
  Administered 2017-11-29 (×2): 1 g via INTRAVENOUS
  Filled 2017-11-28 (×3): qty 10

## 2017-11-28 MED ORDER — ONDANSETRON HCL 4 MG PO TABS: 4.0000 mg | ORAL_TABLET | Freq: Four times a day (QID) | ORAL | Status: DC | PRN

## 2017-11-28 MED ORDER — ONDANSETRON HCL 4 MG/2ML IJ SOLN: 4.0000 mg | Freq: Four times a day (QID) | INTRAMUSCULAR | Status: DC | PRN

## 2017-11-28 MED ORDER — INSULIN ASPART PROT & ASPART (70-30 MIX) 100 UNIT/ML ~~LOC~~ SUSP
18.0000 [IU] | Freq: Two times a day (BID) | SUBCUTANEOUS | Status: DC
  Administered 2017-11-29 (×2): 18 [IU] via SUBCUTANEOUS
  Filled 2017-11-28 (×2): qty 1

## 2017-11-28 MED ORDER — PANTOPRAZOLE SODIUM 40 MG PO TBEC
40.0000 mg | DELAYED_RELEASE_TABLET | Freq: Every day | ORAL | Status: DC
  Administered 2017-11-29 – 2017-11-30 (×2): 40 mg via ORAL
  Filled 2017-11-28 (×2): qty 1

## 2017-11-28 MED ORDER — ATORVASTATIN CALCIUM 20 MG PO TABS
40.0000 mg | ORAL_TABLET | Freq: Every day | ORAL | Status: DC
  Administered 2017-11-28 – 2017-11-30 (×3): 40 mg via ORAL
  Filled 2017-11-28 (×3): qty 2

## 2017-11-28 NOTE — ED Notes (Signed)
Pt has pain in tip of bladder.  Pt reports diff urinating for 3 days.  No n/v/d.  Pt alert.  md in with pt.  Family with pt  Pt hard of hearing.

## 2017-11-28 NOTE — ED Notes (Signed)
Report called to tricia rn floor nurse 

## 2017-11-28 NOTE — ED Notes (Signed)
Foley cath placed   approx 1800cc in foley bag.  Pt states feeing better.  Urine orange/bloody  Tolerated rocedure without diff.

## 2017-11-28 NOTE — ED Triage Notes (Addendum)
Pt reports is being treated for UTI. Pt has recurrent UTI. Pt reports has only been dribbling urine for two days and it is painful when she does. Pt reports she is retaining urine. Pt reports [facility] inserted some sort of packing material with antibiotics in it and thinks this may be causing some of the pain.

## 2017-11-28 NOTE — H&P (Signed)
[employer] -  at [facility]   PATIENT NAME: Pamela [name]    MR#:  [id number]  DATE OF BIRTH:  Jan 17, 1934  DATE OF ADMISSION:  11/28/2017  PRIMARY CARE PHYSICIAN: Mick Sell, MD   REQUESTING/REFERRING PHYSICIAN:   CHIEF COMPLAINT:   Chief Complaint  Patient presents with  . Dysuria  . Urinary Retention    HISTORY OF PRESENT ILLNESS: Pamela [name]  is a 82 y.o. female with a known history per below presents from home with urinary tract infection symptomatology, complains of dysuria for the last 3 days which has been worsening, acute on chronic urinary retention, has been on Omnicef since November 24, 2017 for recurrent urinary tract infection by primary care provider, is followed by [facility] urology-had been receiving gentamicin bladder instillations 3 times a week since June 2018-last dose was 2 weeks ago, in the emergency room patient in no apparent distress, Foley placed, family at the bedside, UA positive/noted for yeast, patient is now being admitted for acute UTI and AKI.  PAST MEDICAL HISTORY:   Past Medical History:  Diagnosis Date  . A-fib (HCC) 08/28/2014   Overview:  Overview:  Diastolic dysfunction on ECHO 04/2012-done at [facility]   . Abnormal gait 02/08/2012  . Abscess or cellulitis of leg 04/18/2012  . Absence of bladder continence 07/29/2012  . Acid reflux 02/08/2012  . Acute kidney failure (HCC) 05/15/2012  . Afib, paroxysmal, with RVR at times with BBB 05/13/2012  . Allergic drug rash 05/15/2012  . Anemia 05/13/2012  . 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 Benign essential HTN 02/08/2012  . Bladder infection, chronic 06/29/2013  . BP (high blood pressure) 05/12/2012  . Bursitis of knee 08/07/2014  . Cellulitis in diabetic foot (HCC) 05/12/2012  . Chronic kidney disease 06/01/2012   Overview:  Overview:   12/2012- Renal US, simple L cyst. UIEP normal   . Clinical depression 08/28/2014  . CN (constipation) 03/17/2012  . Dermatitis due to drug  reaction 05/15/2012  . Diabetes mellitus   . Diastolic dysfunction, left ventricle 05/14/2012  . DM (diabetes mellitus) (HCC) 05/12/2012  . Dysrhythmia   . Dysuria   . Edema leg 07/02/2014  . Enthesopathy of knee 08/07/2014  . Excess fluid volume 05/20/2012  . FOM (frequency of micturition) 07/29/2012  . Frank hematuria 09/27/2014  . Gangrene (HCC)   . GERD (gastroesophageal reflux disease)   . Heart disease   . High cholesterol   . HTN (hypertension) 05/12/2012  . Hypercholesterolemia 05/12/2012  . Hypertension   . Incomplete bladder emptying 07/29/2012  . Open wound of knee, leg (except thigh), and ankle 06/01/2012  . 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   Overview:  Overview:  Several years ago, allergic reaction to sulfa antibiotic Several years ago, allergic reaction to sulfa antibiotic     PAST SURGICAL HISTORY:  Past Surgical History:  Procedure Laterality Date  . ABDOMINAL HYSTERECTOMY    . BLADDER SURGERY    . cataract surgery    . ENDOVENOUS ABLATION SAPHENOUS VEIN W/ LASER    . NASAL SINUS SURGERY    . TOE AMPUTATION    . TONSILLECTOMY      SOCIAL HISTORY:  Social History   Tobacco Use  . Smoking status: Never Smoker  . Smokeless tobacco: Never Used  Substance Use Topics  . Alcohol use: No    Alcohol/week: 0.0 oz    FAMILY HISTORY:  Family History  Problem Relation Age of Onset  . Diabetes Mellitus II Mother   .  Heart disease Mother   . Lung cancer Father   . Diabetes Paternal Grandmother   . Diabetes Maternal Grandmother   . Kidney disease Neg Hx   . Prostate cancer Neg Hx   . Bladder Cancer Neg Hx     DRUG ALLERGIES:  Allergies  Allergen Reactions  . Ciprofloxacin Anaphylaxis  . Sulfamethoxazole-Trimethoprim Other (See Comments) and Rash    Went into [employer] Johnson Sydrome.  . Augmentin [Amoxicillin-Pot Clavulanate] Hives  . Levofloxacin Other (See Comments)  . Losartan Potassium-Hctz Other (See Comments)    Other reaction(s): Unknown  .  Nitrofurantoin Other (See Comments)  . Sulfa Antibiotics Other (See Comments)    Levonne SpillerStevens johnson Steven's Johnsons  . Sulfasalazine     unknown  . Levaquin [Levofloxacin In D5w]     REVIEW OF SYSTEMS:   CONSTITUTIONAL: No fever, fatigue or weakness.  EYES: No blurred or double vision.  EARS, NOSE, AND THROAT: No tinnitus or ear pain.  RESPIRATORY: No cough, shortness of breath, wheezing or hemoptysis.  CARDIOVASCULAR: No chest pain, orthopnea, edema.  GASTROINTESTINAL: No nausea, vomiting, diarrhea or abdominal pain.  GENITOURINARY: + dysuria, urine retention ENDOCRINE: No polyuria, nocturia,  HEMATOLOGY: No anemia, easy bruising or bleeding SKIN: No rash or lesion. MUSCULOSKELETAL: No joint pain or arthritis.   NEUROLOGIC: No tingling, numbness, weakness.  PSYCHIATRY: No anxiety or depression.   MEDICATIONS AT HOME:  Prior to Admission medications   Medication Sig Start Date End Date Taking? Authorizing Provider  amiodarone (PACERONE) 200 MG tablet TAKE 1 TABLET (200 MG TOTAL) BY MOUTH ONCE DAILY. 12/16/15  Yes [provider]  amitriptyline (ELAVIL) 50 MG tablet TAKE 1 TABLET (50 MG TOTAL) BY MOUTH NIGHTLY. 03/31/16  Yes [provider]  atorvastatin (LIPITOR) 40 MG tablet Take 40 mg by mouth daily.   Yes [provider]  cefdinir (OMNICEF) 300 MG capsule Take 300 mg by mouth 2 (two) times daily. For 10 days 11/24/17 12/04/17 Yes [provider]  glucose blood test strip 1 each 2 (two) times daily. Reported on 12/18/2015 05/22/15  Yes [provider]  lisinopril (PRINIVIL,ZESTRIL) 20 MG tablet Take 20 mg by mouth daily.   Yes [provider]  metFORMIN (GLUCOPHAGE) 1000 MG tablet Take 1,000 mg by mouth 2 (two) times daily with a meal.    Yes [provider]  metoprolol (LOPRESSOR) 50 MG tablet Take 1 tablet (50 mg total) by mouth 2 (two) times daily. 05/23/12  Yes Osvaldo ShipperKrishnan, Gokul, MD  NOVOLOG MIX 70/30 FLEXPEN (70-30) 100  UNIT/ML FlexPen INJECT 20 UNITS SUBQ EVERY MORNING AND 18 UNITS DAILY AT 5PM 07/07/15  Yes [provider]  pantoprazole (PROTONIX) 40 MG tablet Take 40 mg by mouth daily.   Yes [provider]  PHARMACIST CHOICE LANCETS MISC USE 2 TIMES DAILY FOR DIABETIC TESTING 05/22/15  Yes [provider]  tamsulosin (FLOMAX) 0.4 MG CAPS capsule Take 0.4 mg by mouth daily. 30 minutes before the same meal 03/19/16  Yes [provider]      PHYSICAL EXAMINATION:   VITAL SIGNS: Blood pressure (!) 145/72, pulse (!) 54, temperature 98.2 F (36.8 C), temperature source Oral, resp. rate 18, weight 65.8 kg (145 lb), SpO2 100 %.  GENERAL:  83 y.o.-year-old patient lying in the bed with no acute distress. Frail appearing EYES: Pupils equal, round, reactive to light and accommodation. No scleral icterus. Extraocular muscles intact.  HEENT: Head atraumatic, normocephalic. Oropharynx and nasopharynx clear.  NECK:  Supple, no jugular venous distention.  No thyroid enlargement, no tenderness.  LUNGS: Normal breath sounds bilaterally, no wheezing, rales,rhonchi or crepitation. No use of accessory muscles of respiration.  CARDIOVASCULAR: S1, S2 normal. No murmurs, rubs, or gallops.  ABDOMEN: Soft, nontender, nondistended. Bowel sounds present. No organomegaly or mass.  EXTREMITIES: No pedal edema, cyanosis, or clubbing.  NEUROLOGIC: Cranial nerves II through XII are intact. MAES. Gait not checked.  PSYCHIATRIC: The patient is alert and oriented x 3.  SKIN: No obvious rash, lesion, or ulcer.   LABORATORY PANEL:   CBC Recent Labs  Lab 11/28/17 1642  WBC 7.7  HGB 11.0*  HCT 33.6*  PLT 220  MCV 97.7  MCH 31.9  MCHC 32.6  RDW 14.0  LYMPHSABS 0.4*  MONOABS 0.8  EOSABS 0.0  BASOSABS 0.0   ------------------------------------------------------------------------------------------------------------------  Chemistries  Recent Labs  Lab 11/28/17 1642  NA 131*  K 5.6*  CL 100*   CO2 19*  GLUCOSE 207*  BUN 71*  CREATININE 3.77*  CALCIUM 9.0  AST 27  ALT 15  ALKPHOS 68  BILITOT 0.9   ------------------------------------------------------------------------------------------------------------------ estimated creatinine clearance is 10.5 mL/min (A) (by C-G formula based on SCr of 3.77 mg/dL (H)). ------------------------------------------------------------------------------------------------------------------ No results for input(s): TSH, T4TOTAL, T3FREE, THYROIDAB in the last 72 hours.  Invalid input(s): FREET3   Coagulation profile No results for input(s): INR, PROTIME in the last 168 hours. ------------------------------------------------------------------------------------------------------------------- No results for input(s): DDIMER in the last 72 hours. -------------------------------------------------------------------------------------------------------------------  Cardiac Enzymes No results for input(s): CKMB, TROPONINI, MYOGLOBIN in the last 168 hours.  Invalid input(s): CK ------------------------------------------------------------------------------------------------------------------ Invalid input(s): POCBNP  ---------------------------------------------------------------------------------------------------------------  Urinalysis    Component Value Date/Time   COLORURINE AMBER (A) 11/28/2017 1655   APPEARANCEUR CLOUDY (A) 11/28/2017 1655   APPEARANCEUR Cloudy (A) 08/12/2017 1441   LABSPEC 1.013 11/28/2017 1655   LABSPEC 1.010 07/16/2012 0655   PHURINE 5.0 11/28/2017 1655   GLUCOSEU NEGATIVE 11/28/2017 1655   GLUCOSEU 50 mg/dL 09/81/191408/17/2013 [id number]   HGBUR SMALL (A) 11/28/2017 1655   BILIRUBINUR NEGATIVE 11/28/2017 1655   BILIRUBINUR Negative 08/12/2017 1441   BILIRUBINUR Negative 07/16/2012 0655   KETONESUR NEGATIVE 11/28/2017 1655   PROTEINUR 30 (A) 11/28/2017 1655   UROBILINOGEN 0.2 05/15/2012 0952   NITRITE NEGATIVE 11/28/2017  1655   LEUKOCYTESUR MODERATE (A) 11/28/2017 1655   LEUKOCYTESUR 3+ (A) 08/12/2017 1441   LEUKOCYTESUR Trace 07/16/2012 0655     RADIOLOGY: No results found.  EKG: Orders placed or performed during the hospital encounter of 06/22/16  . EKG 12-Lead  . EKG 12-Lead  . EKG    IMPRESSION AND PLAN: 1 acute recurrent UTI Empiric Rocephin/diflucan, f/u on cultures  2 acute on chronic urinary retention Continue foley for now, flomax, UTI rx per above  3 AKI w/ CKD III Cr BL 1.4 Due to dehydration and probable retention Continue foley, IVFs for rehydration, BMP in am  4 acute hyperkalemia Hold ACEi, IVFs, check bmp in am  5 chronic IDDM, II Hold metformin, continue Novolog bid, SSI w/ accuchecks per routine  Full code Stable Prognosis good DVT prophalaxis w/ heparin sq Dispo to home in 1-3 days  All the records are reviewed and case discussed with ED provider. Management plans discussed with the patient, family and they are in agreement.  CODE STATUS: Code Status History    Date Active Date Inactive Code Status Order ID Comments User Context   05/13/2012 02:18 05/23/2012 19:20 Full Code [id number]  LEWIS, CHELSEA N ED       TOTAL TIME TAKING CARE OF  THIS PATIENT: 45 minutes.    Evelena Asa Salary M.D on 11/28/2017   Between 7am to 6pm - Pager - [phone]  After 6pm go to [email or web address] - password [email or web address]  [facility]  Office  [phone]  CC: Primary care physician; Mick Sell, MD   Note: This dictation was prepared with Dragon dictation along with smaller phrase technology. Any transcriptional errors that result from this process are unintentional.

## 2017-11-28 NOTE — ED Notes (Signed)
Pt sleeping.  Family with pt 

## 2017-11-28 NOTE — ED Provider Notes (Addendum)
[facility] Emergency Department Provider Note   ____________________________________________   First MD Initiated Contact with Patient 11/28/17 1628     (approximate)  I have reviewed the triage vital signs and the nursing notes.   HISTORY  Chief Complaint Dysuria and Urinary Retention   HPI Pamela [name] is a 83 y.o. female Patient complains of severe dysuria. She's had a lot of pain and can't pass her urine for last 3 days. She went to see [facility] urological and thought they put some packing material in her but actually what they did was by catheter and then instill gentamicin. Patient says she still cannot urinate. Bladder scan shows about a liter per nursing report. Patient denies any coughing abdominal pain nausea vomiting diarrhea or any other complaints.patient has diabetic neuropathy in her legs reports that is stable has not worsened is no numbness in her perineum at all   Past Medical History:  Diagnosis Date  . A-fib (HCC) 08/28/2014   Overview:  Overview:  Diastolic dysfunction on ECHO 04/2012-done at [facility]   . Abnormal gait 02/08/2012  . Abscess or cellulitis of leg 04/18/2012  . Absence of bladder continence 07/29/2012  . Acid reflux 02/08/2012  . Acute kidney failure (HCC) 05/15/2012  . Afib, paroxysmal, with RVR at times with BBB 05/13/2012  . Allergic drug rash 05/15/2012  . Anemia 05/13/2012  . 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 Benign essential HTN 02/08/2012  . Bladder infection, chronic 06/29/2013  . BP (high blood pressure) 05/12/2012  . Bursitis of knee 08/07/2014  . Cellulitis in diabetic foot (HCC) 05/12/2012  . Chronic kidney disease 06/01/2012   Overview:  Overview:   12/2012- Renal US, simple L cyst. UIEP normal   . Clinical depression 08/28/2014  . CN (constipation) 03/17/2012  . Dermatitis due to drug reaction 05/15/2012  . Diabetes mellitus   . Diastolic dysfunction, left ventricle 05/14/2012  . DM (diabetes mellitus) (HCC)  05/12/2012  . Dysrhythmia   . Dysuria   . Edema leg 07/02/2014  . Enthesopathy of knee 08/07/2014  . Excess fluid volume 05/20/2012  . FOM (frequency of micturition) 07/29/2012  . Frank hematuria 09/27/2014  . Gangrene (HCC)   . GERD (gastroesophageal reflux disease)   . Heart disease   . High cholesterol   . HTN (hypertension) 05/12/2012  . Hypercholesterolemia 05/12/2012  . Hypertension   . Incomplete bladder emptying 07/29/2012  . Open wound of knee, leg (except thigh), and ankle 06/01/2012  . 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   Overview:  Overview:  Several years ago, allergic reaction to sulfa antibiotic Several years ago, allergic reaction to sulfa antibiotic     Patient Active Problem List   Diagnosis Date Noted  . UTI (urinary tract infection) 11/28/2017  . Pelvic muscle wasting 01/26/2017  . Cystocele, midline 01/26/2017  . Urinary frequency 12/26/2015  . Incontinence 12/07/2015  . Cystocele 12/07/2015  . Tinea cruris 12/07/2015  . Recurrent UTI 08/11/2015  . Vaginal atrophy 08/11/2015  . Fall 08/11/2015  . 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 Discoordination 05/08/2015  . Frank hematuria 09/27/2014  . A-fib (HCC) 08/28/2014  . Clinical depression 08/28/2014  . Atrial fibrillation (HCC) 08/28/2014  . Major depressive disorder with single episode 08/28/2014  . Bursitis of knee 08/07/2014  . Enthesopathy of knee 08/07/2014  . Edema leg 07/02/2014  . Bladder infection, chronic 06/29/2013  . Incomplete bladder emptying 07/29/2012  . FOM (frequency of micturition) 07/29/2012  . Absence of bladder continence 07/29/2012  . Chronic  kidney disease 06/01/2012  . Open wound of knee, leg (except thigh), and ankle 06/01/2012  . Skin rash 05/20/2012  . Fluid overload 05/20/2012  . Excess fluid volume 05/20/2012  . Acute kidney failure (HCC) 05/15/2012  . Allergic drug rash 05/15/2012  . 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  . Dermatitis due to drug taken internally  05/15/2012  . Dermatitis due to drug reaction 05/15/2012  . Acute renal failure (HCC) 05/15/2012  . Diastolic dysfunction, left ventricle 05/14/2012  . Afib, paroxysmal, with RVR at times with BBB 05/13/2012  . Anemia 05/13/2012  . Cellulitis in diabetic foot (HCC) 05/12/2012  . DM (diabetes mellitus) (HCC) 05/12/2012  . HTN (hypertension) 05/12/2012  . Hypercholesterolemia 05/12/2012  . BP (high blood pressure) 05/12/2012  . Abscess or cellulitis of leg 04/18/2012  . Diabetes mellitus (HCC) 04/18/2012  . CN (constipation) 03/17/2012  . Abnormal gait 02/08/2012  . Benign essential HTN 02/08/2012  . Acid reflux 02/08/2012    Past Surgical History:  Procedure Laterality Date  . ABDOMINAL HYSTERECTOMY    . BLADDER SURGERY    . cataract surgery    . ENDOVENOUS ABLATION SAPHENOUS VEIN W/ LASER    . NASAL SINUS SURGERY    . TOE AMPUTATION    . TONSILLECTOMY      Prior to Admission medications   Medication Sig Start Date End Date Taking? Authorizing Provider  amiodarone (PACERONE) 200 MG tablet TAKE 1 TABLET (200 MG TOTAL) BY MOUTH ONCE DAILY. 12/16/15  Yes [provider]  amitriptyline (ELAVIL) 50 MG tablet TAKE 1 TABLET (50 MG TOTAL) BY MOUTH NIGHTLY. 03/31/16  Yes [provider]  atorvastatin (LIPITOR) 40 MG tablet Take 40 mg by mouth daily.   Yes [provider]  cefdinir (OMNICEF) 300 MG capsule Take 300 mg by mouth 2 (two) times daily. For 10 days 11/24/17 12/04/17 Yes [provider]  glucose blood test strip 1 each 2 (two) times daily. Reported on 12/18/2015 05/22/15  Yes [provider]  lisinopril (PRINIVIL,ZESTRIL) 20 MG tablet Take 20 mg by mouth daily.   Yes [provider]  metFORMIN (GLUCOPHAGE) 1000 MG tablet Take 1,000 mg by mouth 2 (two) times daily with a meal.    Yes [provider]  metoprolol (LOPRESSOR) 50 MG tablet Take 1 tablet (50 mg total) by mouth 2 (two) times daily. 05/23/12  Yes Osvaldo Shipper, MD   NOVOLOG MIX 70/30 FLEXPEN (70-30) 100 UNIT/ML FlexPen INJECT 20 UNITS SUBQ EVERY MORNING AND 18 UNITS DAILY AT 5PM 07/07/15  Yes [provider]  pantoprazole (PROTONIX) 40 MG tablet Take 40 mg by mouth daily.   Yes [provider]  PHARMACIST CHOICE LANCETS MISC USE 2 TIMES DAILY FOR DIABETIC TESTING 05/22/15  Yes [provider]  tamsulosin (FLOMAX) 0.4 MG CAPS capsule Take 0.4 mg by mouth daily. 30 minutes before the same meal 03/19/16  Yes [provider]    Allergies Ciprofloxacin; Sulfamethoxazole-trimethoprim; Augmentin [amoxicillin-pot clavulanate]; Levofloxacin; Losartan potassium-hctz; Nitrofurantoin; Sulfa antibiotics; Sulfasalazine; and Levaquin [levofloxacin in d5w]  Family History  Problem Relation Age of Onset  . Diabetes Mellitus II Mother   . Heart disease Mother   . Lung cancer Father   . Diabetes Paternal Grandmother   . Diabetes Maternal Grandmother   . Kidney disease Neg Hx   . Prostate cancer Neg Hx   . Bladder Cancer Neg Hx     Social History Social History   Tobacco Use  . Smoking status: Never Smoker  . Smokeless  tobacco: Never Used  Substance Use Topics  . Alcohol use: No    Alcohol/week: 0.0 oz  . Drug use: No    Review of Systems  Constitutional: No fever/chills Eyes: No visual changes. ENT: No sore throat. Cardiovascular: Denies chest pain. Respiratory: Denies shortness of breath. Gastrointestinal: No abdominal pain.  No nausea, no vomiting.  No diarrhea.  No constipation. Genitourinary:  dysuria. Musculoskeletal: Negative for back pain. Skin: Negative for rash. Neurological: Negative for headaches, focal weakness   ____________________________________________   PHYSICAL EXAM:  VITAL SIGNS: ED Triage Vitals [11/28/17 1615]  Enc Vitals Group     BP 130/75     Pulse Rate 69     Resp 18     Temp 98.2 F (36.8 C)     Temp Source Oral     SpO2 99 %     Weight 145 lb (65.8 kg)     Height      Head  Circumference      Peak Flow      Pain Score 9     Pain Loc      Pain Edu?      Excl. in GC?    Constitutional: Alert and oriented. Well appearing and in no acute distress. Eyes: Conjunctivae are normal. Head: Atraumatic. Nose: No congestion/rhinnorhea. Mouth/Throat: Mucous membranes are moist.  Oropharynx non-erythematous. Neck: No stridor.   Cardiovascular: Normal rate, regular rhythm. Grossly normal heart sounds.  Good peripheral circulation. Respiratory: Normal respiratory effort.  No retractions. Lungs CTAB. Gastrointestinal: Soft and nontender. No distention. No abdominal bruits. No CVA tenderness. Musculoskeletal: No lower extremity tenderness nor edema.  No joint effusions. Neurologic:  Normal speech and language. No gross focal neurologic deficits are appreciated. No gait instability. Skin:  Skin is warm, dry and intact. No rash noted. Psychiatric: Mood and affect are normal. Speech and behavior are normal.  ____________________________________________   LABS (all labs ordered are listed, but only abnormal results are displayed)  Labs Reviewed  COMPREHENSIVE METABOLIC PANEL - Abnormal; Notable for the following components:      Result Value   Sodium 131 (*)    Potassium 5.6 (*)    Chloride 100 (*)    CO2 19 (*)    Glucose, Bld 207 (*)    BUN 71 (*)    Creatinine, Ser 3.77 (*)    Total Protein 5.8 (*)    Albumin 3.1 (*)    GFR calc non Af Amer 10 (*)    GFR calc Af Amer 12 (*)    All other components within normal limits  CBC WITH DIFFERENTIAL/PLATELET - Abnormal; Notable for the following components:   RBC 3.44 (*)    Hemoglobin 11.0 (*)    HCT 33.6 (*)    Lymphs Abs 0.4 (*)    All other components within normal limits  URINALYSIS, COMPLETE (UACMP) WITH MICROSCOPIC - Abnormal; Notable for the following components:   Color, Urine AMBER (*)    APPearance CLOUDY (*)    Hgb urine dipstick SMALL (*)    Protein, ur 30 (*)    Leukocytes, UA MODERATE (*)     Bacteria, UA RARE (*)    Squamous Epithelial / LPF 0-5 (*)    All other components within normal limits  URINE CULTURE  GLUCOSE, CAPILLARY  BASIC METABOLIC PANEL   ____________________________________________  EKG   ____________________________________________  RADIOLOGY   ____________________________________________   PROCEDURES  Procedure(s) performed:   Procedures  Critical Care performed:  ____________________________________________   INITIAL  IMPRESSION / ASSESSMENT AND PLAN / ED COURSE  patient has a multifactorial UTI versus apparently resistant to several different drugs patient has already seen outpatient neurology and failed their treatment. Additionally she has acute kidney injury I believe she needs to come in the hospital. Clinical Course as of Nov 28 2156  Sun Nov 28, 2017  1832 Specific Gravity, Urine: 1.013 [PM]    Clinical Course User Index [PM] Arnaldo NatalMalinda, Jalyssa Fleisher F, MD     ____________________________________________   FINAL CLINICAL IMPRESSION(S) / ED DIAGNOSES  Final diagnoses:  AKI (acute kidney injury) [facility](HCC)  Acute urinary retention     ED Discharge Orders    None       Note:  This document was prepared using Dragon voice recognition software and may include unintentional dictation errors.    Arnaldo NatalMalinda, Yecheskel Kurek F, MD 11/28/17 [id number]    Arnaldo NatalMalinda, Goodwin Kamphaus F, MD 11/28/17 [phone]

## 2017-11-29 ENCOUNTER — Inpatient Hospital Stay: Payer: Medicare Other

## 2017-11-29 DIAGNOSIS — N3020 Other chronic cystitis without hematuria: Secondary | ICD-10-CM

## 2017-11-29 DIAGNOSIS — R338 Other retention of urine: Secondary | ICD-10-CM

## 2017-11-29 DIAGNOSIS — N179 Acute kidney failure, unspecified: Secondary | ICD-10-CM

## 2017-11-29 LAB — GLUCOSE, CAPILLARY
GLUCOSE-CAPILLARY: 86 mg/dL (ref 65–99)
GLUCOSE-CAPILLARY: 155 mg/dL — AB (ref 65–99)
Glucose-Capillary: 112 mg/dL — ABNORMAL HIGH (ref 65–99)
Glucose-Capillary: 138 mg/dL — ABNORMAL HIGH (ref 65–99)

## 2017-11-29 LAB — BASIC METABOLIC PANEL
ANION GAP: 9 (ref 5–15)
BUN: 65 mg/dL — ABNORMAL HIGH (ref 6–20)
CHLORIDE: 108 mmol/L (ref 101–111)
CO2: 20 mmol/L — ABNORMAL LOW (ref 22–32)
Calcium: 8.3 mg/dL — ABNORMAL LOW (ref 8.9–10.3)
Creatinine, Ser: 3.28 mg/dL — ABNORMAL HIGH (ref 0.44–1.00)
GFR calc non Af Amer: 12 mL/min — ABNORMAL LOW (ref >60)
GFR, EST AFRICAN AMERICAN: 14 mL/min — AB (ref >60)
Glucose, Bld: 131 mg/dL — ABNORMAL HIGH (ref 65–99)
POTASSIUM: 4.4 mmol/L (ref 3.5–5.1)
SODIUM: 137 mmol/L (ref 135–145)

## 2017-11-29 LAB — POTASSIUM: POTASSIUM: 4.6 mmol/L (ref 3.5–5.1)

## 2017-11-29 LAB — GENTAMICIN LEVEL, TROUGH: Gentamicin Trough: <0.5 ug/mL — ABNORMAL LOW (ref 0.5–2.0)

## 2017-11-29 NOTE — Evaluation (Signed)
Physical Therapy Evaluation Patient Details Name: Pamela [name] MRN: [id number] DOB: 10/13/1934 Today's Date: 11/29/2017   History of Present Illness  presented to ER secondary to dysuria and urinary retention (x3 days); admitted with UTI, AKI.  Clinical Impression  Upon evaluation, patient alert and oriented to basic information; follows simple commands and demonstrates fair insight/safety awareness.  Bilat UE/LE strength and ROM grossly symmetrical and WFL for basic transfers and mobility (does endorse baseline neuropathy bilat knees distally).  Demonstrates ability to complete bed mobility with mod indep; sit/stand, basic transfers and gait (220') with 4WRW, cga/min assist.  Higher-level balance deficits evident (mild deviations with dynamic gait activities, decreased time 5x sit/stand, decreased functional reach).  Recommend continued use of assist device with all mobility-did encourage trial of regular RW vs 4WRW for additional stability; patient declined, preferring use of 4WRW at this time. Would benefit from skilled PT to address above deficits and promote optimal return to PLOF; Recommend transition to HHPT upon discharge from acute hospitalization.      Follow Up Recommendations Home health PT;Supervision/Assistance - 24 hour    Equipment Recommendations  Rolling walker with 5" wheels    Recommendations for Other Services       Precautions / Restrictions Precautions Precautions: Fall Restrictions Weight Bearing Restrictions: No      Mobility  Bed Mobility Overal bed mobility: Modified Independent                Transfers Overall transfer level: Needs assistance Equipment used: 4-wheeled walker Transfers: Sit to/from Stand Sit to Stand: Min assist;Min guard         General transfer comment: cuing for hand placement and management of brakes on 4WRW (initially attempting to walk with brakes engaged)  Ambulation/Gait Ambulation/Gait assistance: Min guard;Min  assist Ambulation Distance (Feet): 220 Feet Assistive device: Rolling walker (2 wheeled)       General Gait Details: reciprocal stepping pattern with decreased step height/length, veers slightly L requiring cuing from therapist for correction.  Broad turning radius required, indicative of decreased balance reactions, increased fall risk with functional activity.  Stairs            Wheelchair Mobility    Modified Rankin (Stroke Patients Only)       Balance Overall balance assessment: Needs assistance Sitting-balance support: No upper extremity supported;Feet supported Sitting balance-Leahy Scale: Good     Standing balance support: Bilateral upper extremity supported Standing balance-Leahy Scale: Fair                               Pertinent Vitals/Pain Pain Assessment: No/denies pain    Home Living Family/patient expects to be discharged to:: Private residence Living Arrangements: Spouse/significant other Available Help at Discharge: Family(has hired aide 2 afternoons/week to assist with husband and household activities/chores) Type of Home: House Home Access: Stairs to enter Entrance Stairs-Rails: None Secretary/administratorntrance Stairs-Number of Steps: 1+1 Home Layout: One level Home Equipment: Environmental consultantWalker - 4 wheels      Prior Function Level of Independence: Independent with assistive device(s)         Comments: Mod indep for ADLs, household mobilization with 4WRW; does endorse multiple fall history.     Hand Dominance   Dominant Hand: Right    Extremity/Trunk Assessment   Upper Extremity Assessment Upper Extremity Assessment: Overall WFL for tasks assessed(grossly 4/5 throughout)    Lower Extremity Assessment Lower Extremity Assessment: Overall WFL for tasks assessed(grossly 4-/5 throughout)  Communication   Communication: HOH  Cognition Arousal/Alertness: Awake/alert Behavior During Therapy: WFL for tasks assessed/performed Overall Cognitive  Status: Within Functional Limits for tasks assessed                                 General Comments: mild memory deficits noted, patient retelling same story multiple times throughout session      General Comments      Exercises Other Exercises Other Exercises: Standing functional reach 2-3" from immediate BOS, requiring external assist from therapist to move beyond; fair/good awareness of limits of stability and safety needs Other Exercises: 5x sit/stand with 4WRW, cga, 35 seconds-indicative of decreased LE strength/power, increased fall risk with functional activities   Assessment/Plan    PT Assessment Patient needs continued PT services  PT Problem List Decreased strength;Decreased range of motion;Decreased activity tolerance;Decreased balance;Decreased mobility;Decreased coordination;Decreased cognition;Decreased knowledge of use of DME;Decreased safety awareness;Decreased knowledge of precautions       PT Treatment Interventions DME instruction;Gait training;Stair training;Functional mobility training;Therapeutic activities;Therapeutic exercise;Balance training;Patient/family education    PT Goals (Current goals can be found in the Care Plan section)  Acute Rehab PT Goals Patient Stated Goal: to return home with husband PT Goal Formulation: With patient Time For Goal Achievement: 12/13/17 Potential to Achieve Goals: Good    Frequency Min 2X/week   Barriers to discharge Decreased caregiver support      Co-evaluation               AM-PAC PT "6 Clicks" Daily Activity  Outcome Measure Difficulty turning over in bed (including adjusting bedclothes, sheets and blankets)?: A Little Difficulty moving from lying on back to sitting on the side of the bed? : A Little Difficulty sitting down on and standing up from a chair with arms (e.g., wheelchair, bedside commode, etc,.)?: Unable Help needed moving to and from a bed to chair (including a wheelchair)?: A  Little Help needed walking in hospital room?: A Little Help needed climbing 3-5 steps with a railing? : A Lot 6 Click Score: 15    End of Session Equipment Utilized During Treatment: Gait belt Activity Tolerance: Patient tolerated treatment well Patient left: in chair;with call bell/phone within reach;with chair alarm set Nurse Communication: Mobility status PT Visit Diagnosis: Muscle weakness (generalized) (M62.81);Unsteadiness on feet (R26.81);History of falling (Z91.81);Difficulty in walking, not elsewhere classified (R26.2)    Time: [id number] PT Time Calculation (min) (ACUTE ONLY): 27 min   Charges:   PT Evaluation $PT Eval Low Complexity: 1 Low PT Treatments $Therapeutic Activity: 8-22 mins   PT G Codes:   PT G-Codes **NOT FOR INPATIENT CLASS** Functional Assessment Tool Used: AM-PAC 6 Clicks Basic Mobility Functional Limitation: Mobility: Walking and moving around Mobility: Walking and Moving Around Current Status ([id number](G8978): At least 20 percent but less than 40 percent impaired, limited or restricted Mobility: Walking and Moving Around Goal Status [phone](G8979): At least 1 percent but less than 20 percent impaired, limited or restricted    Affan Callow H. Manson PasseyBrown, PT, DPT, NCS 11/29/17, 3:11 PM [phone]

## 2017-11-29 NOTE — Consult Note (Signed)
Date: 11/29/2017                  Patient Name:  Pamela KirkGloria [name]  MRN: [id number]  DOB: 10/11/1934  Age / Sex: 83 y.o., female         PCP: Mick SellFitzgerald, David P, MD                 Service Requesting Consult: IM/ Adrian SaranMody, Sital, MD                 Reason for Consult: ARF            History of Present Illness: Patient is a 83 y.o. female with medical problems of diabetes with neuropathy, recurrent UTIs, urinary incontinence, atrial fibrillation, who was [facility]28/2017 for evaluation of difficulty and pain while avoiding.  She was diagnosed with acute urinary retention.  Outpatient, patient has been getting gentamicin installations in the bladder.  She had cystoscopy earlier in the year which was  unremarkable Her baseline creatinine appears to be 1.2/GFR 43 from March 2018 (care everywhere) Admission creatinine was 3.77.  It has improved to 3.28 after Foley catheter was placed for urinary retention.  Nephrology consult has been requested for acute kidney injury  Medications: Outpatient medications: Facility-Administered Medications Prior to Admission  Medication Dose Route Frequency Provider Last Rate Last Dose  . gentamicin (GARAMYCIN) injection 80 mg  80 mg Other Once Michiel CowboyMcGowan, Shannon A, PA-C      . gentamicin (GARAMYCIN) injection 80 mg  80 mg Other Once Michiel CowboyMcGowan, Shannon A, PA-C       Medications Prior to Admission  Medication Sig Dispense Refill Last Dose  . amiodarone (PACERONE) 200 MG tablet TAKE 1 TABLET (200 MG TOTAL) BY MOUTH ONCE DAILY.   11/27/2017 at AM  . amitriptyline (ELAVIL) 50 MG tablet TAKE 1 TABLET (50 MG TOTAL) BY MOUTH NIGHTLY.  11 11/27/2017 at PM  . atorvastatin (LIPITOR) 40 MG tablet Take 40 mg by mouth daily.   11/27/2017 at PM  . cefdinir (OMNICEF) 300 MG capsule Take 300 mg by mouth 2 (two) times daily. For 10 days   11/27/2017 at PM  . glucose blood test strip 1 each 2 (two) times daily. Reported on 12/18/2015   UTD at UTD  . lisinopril  (PRINIVIL,ZESTRIL) 20 MG tablet Take 20 mg by mouth daily.   11/27/2017 at AM  . metFORMIN (GLUCOPHAGE) 1000 MG tablet Take 1,000 mg by mouth 2 (two) times daily with a meal.    11/27/2017 at PM  . metoprolol (LOPRESSOR) 50 MG tablet Take 1 tablet (50 mg total) by mouth 2 (two) times daily. 60 tablet 1 11/27/2017 at PM  . NOVOLOG MIX 70/30 FLEXPEN (70-30) 100 UNIT/ML FlexPen INJECT 20 UNITS SUBQ EVERY MORNING AND 18 UNITS DAILY AT 5PM  2 N/A at N/A  . pantoprazole (PROTONIX) 40 MG tablet Take 40 mg by mouth daily.   11/27/2017 at AM  . PHARMACIST CHOICE LANCETS MISC USE 2 TIMES DAILY FOR DIABETIC TESTING   UTD at UTD  . tamsulosin (FLOMAX) 0.4 MG CAPS capsule Take 0.4 mg by mouth daily. 30 minutes before the same meal   N/A at N/A    Current medications: Current Facility-Administered Medications  Medication Dose Route Frequency Provider Last Rate Last Dose  . 0.9 %  sodium chloride infusion   Intravenous Continuous Bertrum SolSalary, Montell D, MD 125 mL/hr at 11/29/17 0744    . acetaminophen (TYLENOL) tablet 650 mg  650 mg  Oral Q6H PRN Salary, Montell D, MD       Or  . acetaminophen (TYLENOL) suppository 650 mg  650 mg Rectal Q6H PRN Salary, Montell D, MD      . amiodarone (PACERONE) tablet 200 mg  200 mg Oral Daily Salary, Montell D, MD      . amitriptyline (ELAVIL) tablet 50 mg  50 mg Oral QHS Salary, Montell D, MD   50 mg at 11/28/17 2325  . atorvastatin (LIPITOR) tablet 40 mg  40 mg Oral Daily Salary, Montell D, MD   40 mg at 11/28/17 2313  . cefTRIAXone (ROCEPHIN) 1 g in dextrose 5 % 50 mL IVPB  1 g Intravenous Q24H Salary, Evelena Asa, MD   Stopped at 11/29/17 0140  . fluconazole (DIFLUCAN) IVPB 100 mg  100 mg Intravenous Q24H Angelina Ok D, MD   Stopped at 11/29/17 0015  . heparin injection 5,000 Units  5,000 Units Subcutaneous Q8H Salary, Montell D, MD   5,000 Units at 11/29/17 0554  . HYDROcodone-acetaminophen (NORCO/VICODIN) 5-325 MG per tablet 1-2 tablet  1-2 tablet Oral Q4H PRN Salary,  Montell D, MD      . insulin aspart (novoLOG) injection 0-15 Units  0-15 Units Subcutaneous TID WC Salary, Montell D, MD      . insulin aspart (novoLOG) injection 0-5 Units  0-5 Units Subcutaneous QHS Salary, Montell D, MD      . insulin aspart protamine- aspart (NOVOLOG MIX 70/30) injection 18 Units  18 Units Subcutaneous BID WC Bertrum Sol, MD   18 Units at 11/29/17 0813  . metoprolol tartrate (LOPRESSOR) tablet 50 mg  50 mg Oral BID Angelina Ok D, MD   50 mg at 11/28/17 2324  . ondansetron (ZOFRAN) tablet 4 mg  4 mg Oral Q6H PRN Salary, Montell D, MD       Or  . ondansetron (ZOFRAN) injection 4 mg  4 mg Intravenous Q6H PRN Salary, Montell D, MD      . pantoprazole (PROTONIX) EC tablet 40 mg  40 mg Oral QAC breakfast Salary, Montell D, MD   40 mg at 11/29/17 0813  . polyethylene glycol (MIRALAX / GLYCOLAX) packet 17 g  17 g Oral Daily PRN Salary, Montell D, MD      . tamsulosin (FLOMAX) capsule 0.4 mg  0.4 mg Oral Daily Salary, Montell D, MD   0.4 mg at 11/28/17 2313      Allergies: Allergies  Allergen Reactions  . Ciprofloxacin Anaphylaxis  . Sulfamethoxazole-Trimethoprim Other (See Comments) and Rash    Went into [employer].  . Augmentin [Amoxicillin-Pot Clavulanate] Hives  . Levofloxacin Other (See Comments)  . Losartan Potassium-Hctz Other (See Comments)    Other reaction(s): Unknown  . Nitrofurantoin Other (See Comments)  . Sulfa Antibiotics Other (See Comments)    Levonne Spiller Steven's Johnsons  . Sulfasalazine     unknown  . Levaquin [Levofloxacin In D5w]       Past Medical History: Past Medical History:  Diagnosis Date  . A-fib (HCC) 08/28/2014   Overview:  Overview:  Diastolic dysfunction on ECHO 04/2012-done at [facility]   . Abnormal gait 02/08/2012  . Abscess or cellulitis of leg 04/18/2012  . Absence of bladder continence 07/29/2012  . Acid reflux 02/08/2012  . Acute kidney failure (HCC) 05/15/2012  . Afib, paroxysmal, with RVR at times  with BBB 05/13/2012  . Allergic drug rash 05/15/2012  . Anemia 05/13/2012  . 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 Benign essential HTN 02/08/2012  .  Bladder infection, chronic 06/29/2013  . BP (high blood pressure) 05/12/2012  . Bursitis of knee 08/07/2014  . Cellulitis in diabetic foot (HCC) 05/12/2012  . Chronic kidney disease 06/01/2012   Overview:  Overview:   12/2012- Renal US, simple L cyst. UIEP normal   . Clinical depression 08/28/2014  . CN (constipation) 03/17/2012  . Dermatitis due to drug reaction 05/15/2012  . Diabetes mellitus   . Diastolic dysfunction, left ventricle 05/14/2012  . DM (diabetes mellitus) (HCC) 05/12/2012  . Dysrhythmia   . Dysuria   . Edema leg 07/02/2014  . Enthesopathy of knee 08/07/2014  . Excess fluid volume 05/20/2012  . FOM (frequency of micturition) 07/29/2012  . Frank hematuria 09/27/2014  . Gangrene (HCC)   . GERD (gastroesophageal reflux disease)   . Heart disease   . High cholesterol   . HTN (hypertension) 05/12/2012  . Hypercholesterolemia 05/12/2012  . Hypertension   . Incomplete bladder emptying 07/29/2012  . Open wound of knee, leg (except thigh), and ankle 06/01/2012  . 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   Overview:  Overview:  Several years ago, allergic reaction to sulfa antibiotic Several years ago, allergic reaction to sulfa antibiotic      Past Surgical History: Past Surgical History:  Procedure Laterality Date  . ABDOMINAL HYSTERECTOMY    . BLADDER SURGERY    . cataract surgery    . ENDOVENOUS ABLATION SAPHENOUS VEIN W/ LASER    . NASAL SINUS SURGERY    . TOE AMPUTATION    . TONSILLECTOMY       Family History: Family History  Problem Relation Age of Onset  . Diabetes Mellitus II Mother   . Heart disease Mother   . Lung cancer Father   . Diabetes Paternal Grandmother   . Diabetes Maternal Grandmother   . Kidney disease Neg Hx   . Prostate cancer Neg Hx   . Bladder Cancer Neg Hx      Social History: Social History    Socioeconomic History  . Marital status: Married    Spouse name: Not on file  . Number of children: Not on file  . Years of education: Not on file  . Highest education level: Not on file  Social Needs  . Financial resource strain: Not on file  . Food insecurity - worry: Not on file  . Food insecurity - inability: Not on file  . Transportation needs - medical: Not on file  . Transportation needs - non-medical: Not on file  Occupational History  . Not on file  Tobacco Use  . Smoking status: Never Smoker  . Smokeless tobacco: Never Used  Substance and Sexual Activity  . Alcohol use: No    Alcohol/week: 0.0 oz  . Drug use: No  . Sexual activity: No  Other Topics Concern  . Not on file  Social History Narrative  . Not on file     Review of Systems: Gen: Denies any fevers or chills HEENT: No vision or hearing problems CV: No chest pain or shortness of breath Resp: No cough or sputum production GI: Appetite is good, no nausea or vomiting GU : Pain with voiding, urinary retention as described above MS: No complaints Derm:  No complaints Psych:No complaints Heme: No complaints Neuro: No complaints Endocrine. No complaints  Vital Signs: Blood pressure (!) 149/54, pulse 71, temperature (!) 97.5 F (36.4 C), temperature source Oral, resp. rate 18, weight 65.8 kg (145 lb), SpO2 99 %.   Intake/Output Summary (Last 24 hours) at 11/29/2017  1610 Last data filed at 11/29/2017 0743 Gross per 24 hour  Intake 1079.44 ml  Output 1600 ml  Net -520.56 ml    Weight trends: [employer]   11/28/17 1615  Weight: 65.8 kg (145 lb)    Physical Exam: General:  Frail, elderly woman, laying in the bed  HEENT  anicteric, moist oral mucous membranes  Neck:  No JVD  Lungs:  Normal breathing effort, clear to auscultation  Heart::  Tachycardic, no rub or gallop  Abdomen:  Soft, mild discomfort with palpation  Extremities:  No peripheral edema  Neurologic:  Alert, oriented   Skin:  No acute rashes      Foley:  Foley catheter in place       Lab results: Basic Metabolic Panel: Recent Labs  Lab 11/28/17 1642 11/29/17 0021 11/29/17 0511  NA 131*  --  137  K 5.6* 4.6 4.4  CL 100*  --  108  CO2 19*  --  20*  GLUCOSE 207*  --  131*  BUN 71*  --  65*  CREATININE 3.77*  --  3.28*  CALCIUM 9.0  --  8.3*    Liver Function Tests: Recent Labs  Lab 11/28/17 1642  AST 27  ALT 15  ALKPHOS 68  BILITOT 0.9  PROT 5.8*  ALBUMIN 3.1*   No results for input(s): LIPASE, AMYLASE in the last 168 hours. No results for input(s): AMMONIA in the last 168 hours.  CBC: Recent Labs  Lab 11/28/17 1642  WBC 7.7  NEUTROABS 6.4  HGB 11.0*  HCT 33.6*  MCV 97.7  PLT 220    Cardiac Enzymes: No results for input(s): CKTOTAL, TROPONINI in the last 168 hours.  BNP: Invalid input(s): POCBNP  CBG: Recent Labs  Lab 11/28/17 2104 11/29/17 0753  GLUCAP 99 112*    Microbiology: No results found for this or any previous visit (from the past 720 hour(s)).   Coagulation Studies: No results for input(s): LABPROT, INR in the last 72 hours.  Urinalysis: Recent Labs    11/28/17 1655  COLORURINE AMBER*  LABSPEC 1.013  PHURINE 5.0  GLUCOSEU NEGATIVE  HGBUR SMALL*  BILIRUBINUR NEGATIVE  KETONESUR NEGATIVE  PROTEINUR 30*  NITRITE NEGATIVE  LEUKOCYTESUR MODERATE*        Imaging:  No results found.   Assessment & Plan: Pt is a 83 y.o. Caucasian  female with long-standing diabetes with neuropathy, atrial fibrillation, recurrent UTIs, bladder wall thickening, was admitted on 11/28/2017 with acute urinary retention   1.  Acute renal failure  2.  Chronic kidney disease stage III 3.  Acute urinary retention 4.  Recurrent UTIs  Patient's baseline creatinine is 1.2/GFR 43 as noted in outpatient records from March 2018 Acute worsening of renal function is likely related to acute urinary retention.  Now that Foley catheter is in place, we will see how  her renal function improves.  Continue supportive care.  We will follow.

## 2017-11-29 NOTE — Progress Notes (Signed)
[facility] [place] [facility]   PATIENT NAME: Pamela [name]    MR#:  [id number]  DATE OF BIRTH:  07/27/1934  SUBJECTIVE:   Here with urinary retention. Reports she has not urinated for 3 days. Foley catheter was placed in the emergency room and now she is urinating.  REVIEW OF SYSTEMS:    Review of Systems  Constitutional: Negative for fever, chills weight loss HENT: Negative for ear pain, nosebleeds, congestion, facial swelling, rhinorrhea, neck pain, neck stiffness and ear discharge.   Respiratory: Negative for cough, shortness of breath, wheezing  Cardiovascular: Negative for chest pain, palpitations and leg swelling.  Gastrointestinal: Negative for heartburn, abdominal pain, vomiting, diarrhea or consitpation Genitourinary: Positive dysuria and urinary retention   Musculoskeletal: Negative for back pain or joint pain Neurological: Negative for dizziness, seizures, syncope, focal weakness,  numbness and headaches.  Hematological: Does not bruise/bleed easily.  Psychiatric/Behavioral: Negative for hallucinations, confusion, dysphoric mood    Tolerating Diet: yes      DRUG ALLERGIES:   Allergies  Allergen Reactions  . Ciprofloxacin Anaphylaxis  . Sulfamethoxazole-Trimethoprim Other (See Comments) and Rash    Went into [employer] Sydrome.  . Augmentin [Amoxicillin-Pot Clavulanate] Hives  . Levofloxacin Other (See Comments)  . Losartan Potassium-Hctz Other (See Comments)    Other reaction(s): Unknown  . Nitrofurantoin Other (See Comments)  . Sulfa Antibiotics Other (See Comments)    Levonne SpillerStevens johnson Steven's Johnsons  . Sulfasalazine     unknown  . Levaquin [Levofloxacin In D5w]     VITALS:  Blood pressure (!) 143/52, pulse 72, temperature (!) 97.5 F (36.4 C), temperature source Oral, resp. rate 18, weight 65.8 kg (145 lb), SpO2 99 %.  PHYSICAL EXAMINATION:  Constitutional: Appears well-developed and well-nourished. No  distress. HENT: Normocephalic. Marland Kitchen. Oropharynx is clear and moist.  Eyes: Conjunctivae and EOM are normal. PERRLA, no scleral icterus.  Neck: Normal ROM. Neck supple. No JVD. No tracheal deviation. CVS: RRR, S1/S2 +, no murmurs, no gallops, no carotid bruit.  Pulmonary: Effort and breath sounds normal, no stridor, rhonchi, wheezes, rales.  Abdominal: Soft. BS +,  no distension, tenderness, rebound or guarding.  Musculoskeletal: Normal range of motion. No edema and no tenderness.  Neuro: Alert. CN 2-12 grossly intact. No focal deficits. Skin: Skin is warm and dry. No rash noted. Psychiatric: Normal mood and affect.      LABORATORY PANEL:   CBC Recent Labs  Lab 11/28/17 1642  WBC 7.7  HGB 11.0*  HCT 33.6*  PLT 220   ------------------------------------------------------------------------------------------------------------------  Chemistries  Recent Labs  Lab 11/28/17 1642  11/29/17 0511  NA 131*  --  137  K 5.6*   < > 4.4  CL 100*  --  108  CO2 19*  --  20*  GLUCOSE 207*  --  131*  BUN 71*  --  65*  CREATININE 3.77*  --  3.28*  CALCIUM 9.0  --  8.3*  AST 27  --   --   ALT 15  --   --   ALKPHOS 68  --   --   BILITOT 0.9  --   --    < > = values in this interval not displayed.   ------------------------------------------------------------------------------------------------------------------  Cardiac Enzymes No results for input(s): TROPONINI in the last 168 hours. ------------------------------------------------------------------------------------------------------------------  RADIOLOGY:  No results found.   ASSESSMENT AND PLAN:   83 year old female with multiple medical problems including chronic diastolic heart failure, diabetes and hypertension currently being followed with  urology and receiving gentamicin bladder instillations who presents with urinary retention.   1. Acute on chronic urinary retention: Patient has a Foley catheter now Continue  Flomax Consult with urology requested and case discussed with urologist on call  2. Acute on chronic kidney disease stage III: This is likely postrenal Nephrology consultation requested Renal ultrasound ordered Continue Foley and IV fluids  3. Hyperkalemia due to acute kidney injury which is improved  4. Urinary tract infection: Continue Rocephin and Diflucan for budding yeast Follow-up on final culture  5. Diabetes: Continue sliding scale, ADA diet, NPH  6. Essential hypertension: Continue metoprolol  7. PAF: Continue amiodarone and metoprolol for heart rate control  8.Hyperllipidemia: Continue statin  9. Chronic diastolic heart failure without signs of exacerbation    Management plans discussed with the patient and she is in agreement.  CODE STATUS: full  TOTAL TIME TAKING CARE OF THIS PATIENT: 30 minutes.   PT consultation for discharge planning  POSSIBLE D/C 2 days, DEPENDING ON CLINICAL CONDITION.   Shawntrice Salle M.D on 11/29/2017 at 10:20 AM  Between 7am to 6pm - Pager - [phone] After 6pm go to [email or web address] - password [email or web address]  [facility]  Office  [phone]  CC: Primary care physician; Mick SellFitzgerald, David P, MD  Note: This dictation was prepared with Dragon dictation along with smaller phrase technology. Any transcriptional errors that result from this process are unintentional.

## 2017-11-29 NOTE — Consult Note (Signed)
Urology Consult  Referring physician: Dr. Benjie Karvonen Reason for referral: recurrent UTI and urinary retention  Chief Complaint: urinary retention  History of Present Illness: Pamela [name] is a 83yo with a hx of recurrent cystitis and dysuria admitted with acute renal failure and UTI. She was found to be in urinary retention in the ER and a foley catheter was placed. Since foley placement she has made 2.3L of urine. For 3 days before admission the patient has had worsening urinary urgency, frequency and a feeling of incomplete emptying. She had not urinated for 24 hours prior to admission. She also has a hx of recurrent UTI and is currently having gentamicin bladder installation 3 times per week at [facility]. Urine culture is pending. She has resolution of her abdominal pain since foley catheter placement. NO gross hematuria.  Past Medical History:  Diagnosis Date  . A-fib ([place]) 08/28/2014   Overview:  Overview:  Diastolic dysfunction on ECHO 04/2012-done at [facility]   . Abnormal gait 02/08/2012  . Abscess or cellulitis of leg 04/18/2012  . Absence of bladder continence 07/29/2012  . Acid reflux 02/08/2012  . Acute kidney failure ([place]) 05/15/2012  . Afib, paroxysmal, with RVR at times with BBB 05/13/2012  . Allergic drug rash 05/15/2012  . Anemia 05/13/2012  . 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 Benign essential HTN 02/08/2012  . Bladder infection, chronic 06/29/2013  . BP (high blood pressure) 05/12/2012  . Bursitis of knee 08/07/2014  . Cellulitis in diabetic foot ([place]) 05/12/2012  . Chronic kidney disease 06/01/2012   Overview:  Overview:   12/2012- Renal US, simple L cyst. UIEP normal   . Clinical depression 08/28/2014  . CN (constipation) 03/17/2012  . Dermatitis due to drug reaction 05/15/2012  . Diabetes mellitus   . Diastolic dysfunction, left ventricle 05/14/2012  . DM (diabetes mellitus) ([place]) 05/12/2012  . Dysrhythmia   . Dysuria   . Edema leg 07/02/2014  . Enthesopathy of knee 08/07/2014  .  Excess fluid volume 05/20/2012  . FOM (frequency of micturition) 07/29/2012  . Frank hematuria 09/27/2014  . Gangrene ([place])   . GERD (gastroesophageal reflux disease)   . Heart disease   . High cholesterol   . HTN (hypertension) 05/12/2012  . Hypercholesterolemia 05/12/2012  . Hypertension   . Incomplete bladder emptying 07/29/2012  . Open wound of knee, leg (except thigh), and ankle 06/01/2012  . Stevens-Johnson syndrome ([place]) 05/15/2012   Overview:  Overview:  Several years ago, allergic reaction to sulfa antibiotic Several years ago, allergic reaction to sulfa antibiotic    Past Surgical History:  Procedure Laterality Date  . ABDOMINAL HYSTERECTOMY    . BLADDER SURGERY    . cataract surgery    . ENDOVENOUS ABLATION SAPHENOUS VEIN W/ LASER    . NASAL SINUS SURGERY    . TOE AMPUTATION    . TONSILLECTOMY      Medications: I have reviewed the patient's current medications. Allergies:  Allergies  Allergen Reactions  . Ciprofloxacin Anaphylaxis  . Sulfamethoxazole-Trimethoprim Other (See Comments) and Rash    Went into [employer].  . Augmentin [Amoxicillin-Pot Clavulanate] Hives  . Levofloxacin Other (See Comments)  . Losartan Potassium-Hctz Other (See Comments)    Other reaction(s): Unknown  . Nitrofurantoin Other (See Comments)  . Sulfa Antibiotics Other (See Comments)    Kathreen Cosier Steven's Johnsons  . Sulfasalazine     unknown  . Levaquin [Levofloxacin In D5w]     Family History  Problem Relation Age of Onset  .  Diabetes Mellitus II Mother   . Heart disease Mother   . Lung cancer Father   . Diabetes Paternal Grandmother   . Diabetes Maternal Grandmother   . Kidney disease Neg Hx   . Prostate cancer Neg Hx   . Bladder Cancer Neg Hx    Social History:  reports that  has never smoked. she has never used smokeless tobacco. She reports that she does not drink alcohol or use drugs.  Review of Systems  HENT: Positive for hearing loss.   Genitourinary:  Positive for dysuria, frequency and urgency.  All other systems reviewed and are negative.   Physical Exam:  Vital signs in last 24 hours: Temp:  [98.6 F (37 C)-98.7 F (37.1 C)] 98.7 F (37.1 C) (12/31 1935) Pulse Rate:  [55-72] 71 (12/31 1935) Resp:  [16] 16 (12/31 1220) BP: (114-143)/(44-53) 142/53 (12/31 1935) SpO2:  [94 %] 94 % (12/31 1935) Physical Exam  Constitutional: She is oriented to person, place, and time. She appears well-developed and well-nourished.  HENT:  Head: Normocephalic and atraumatic.  Eyes: EOM are normal. Pupils are equal, round, and reactive to light.  Neck: Normal range of motion. No thyromegaly present.  Cardiovascular: Normal rate and regular rhythm.  Respiratory: Effort normal. No respiratory distress.  GI: Soft. She exhibits no distension and no mass. There is no tenderness. There is no rebound and no guarding.  Musculoskeletal: Normal range of motion. She exhibits no edema.  Neurological: She is alert and oriented to person, place, and time.  Skin: Skin is warm and dry.  Psychiatric: She has a normal mood and affect. Her behavior is normal. Judgment and thought content normal.    Laboratory Data:  Results for orders placed or performed during the hospital encounter of 11/28/17 (from the past 72 hour(s))  Comprehensive metabolic panel     Status: Abnormal   Collection Time: 11/28/17  4:42 PM  Result Value Ref Range   Sodium 131 (L) 135 - 145 mmol/L   Potassium 5.6 (H) 3.5 - 5.1 mmol/L   Chloride 100 (L) 101 - 111 mmol/L   CO2 19 (L) 22 - 32 mmol/L   Glucose, Bld 207 (H) 65 - 99 mg/dL   BUN 71 (H) 6 - 20 mg/dL   Creatinine, Ser 3.77 (H) 0.44 - 1.00 mg/dL   Calcium 9.0 8.9 - 10.3 mg/dL   Total Protein 5.8 (L) 6.5 - 8.1 g/dL   Albumin 3.1 (L) 3.5 - 5.0 g/dL   AST 27 15 - 41 U/L   ALT 15 14 - 54 U/L   Alkaline Phosphatase 68 38 - 126 U/L   Total Bilirubin 0.9 0.3 - 1.2 mg/dL   GFR calc non Af Amer 10 (L) >60 mL/min   GFR calc Af Amer 12  (L) >60 mL/min    Comment: (NOTE) The eGFR has been calculated using the CKD EPI equation. This calculation has not been validated in all clinical situations. eGFR's persistently <60 mL/min signify possible Chronic Kidney Disease.    Anion gap 12 5 - 15    Comment: Performed at [facility], [address] [place], [place] [address]  CBC with Differential     Status: Abnormal   Collection Time: 11/28/17  4:42 PM  Result Value Ref Range   WBC 7.7 3.6 - 11.0 K/uL   RBC 3.44 (L) 3.80 - 5.20 MIL/uL   Hemoglobin 11.0 (L) 12.0 - 16.0 g/dL   HCT 33.6 (L) 35.0 - 47.0 %   MCV  97.7 80.0 - 100.0 fL   MCH 31.9 26.0 - 34.0 pg   MCHC 32.6 32.0 - 36.0 g/dL   RDW 14.0 11.5 - 14.5 %   Platelets 220 150 - 440 K/uL   Neutrophils Relative % 84 %   Neutro Abs 6.4 1.4 - 6.5 K/uL   Lymphocytes Relative 5 %   Lymphs Abs 0.4 (L) 1.0 - 3.6 K/uL   Monocytes Relative 11 %   Monocytes Absolute 0.8 0.2 - 0.9 K/uL   Eosinophils Relative 0 %   Eosinophils Absolute 0.0 0 - 0.7 K/uL   Basophils Relative 0 %   Basophils Absolute 0.0 0 - 0.1 K/uL    Comment: Performed at [facility], [address] [place], [place] [address]  Urinalysis, Complete w Microscopic     Status: Abnormal   Collection Time: 11/28/17  4:55 PM  Result Value Ref Range   Color, Urine AMBER (A) YELLOW    Comment: BIOCHEMICALS MAY BE AFFECTED BY COLOR   APPearance CLOUDY (A) CLEAR   Specific Gravity, Urine 1.013 1.005 - 1.030   pH 5.0 5.0 - 8.0   Glucose, UA NEGATIVE NEGATIVE mg/dL   Hgb urine dipstick SMALL (A) NEGATIVE   Bilirubin Urine NEGATIVE NEGATIVE   Ketones, ur NEGATIVE NEGATIVE mg/dL   Protein, ur 30 (A) NEGATIVE mg/dL   Nitrite NEGATIVE NEGATIVE   Leukocytes, UA MODERATE (A) NEGATIVE   RBC / HPF TOO NUMEROUS TO COUNT 0 - 5 RBC/hpf   WBC, UA TOO NUMEROUS TO COUNT 0 - 5 WBC/hpf   Bacteria, UA RARE (A) NONE SEEN   Squamous Epithelial / LPF 0-5 (A) NONE SEEN   WBC Clumps PRESENT    Budding Yeast  PRESENT     Comment: Performed at [facility], [address] [place], [place] [address]  Glucose, capillary     Status: None   Collection Time: 11/28/17  9:04 PM  Result Value Ref Range   Glucose-Capillary 99 65 - 99 mg/dL  Potassium     Status: None   Collection Time: 11/29/17 12:21 AM  Result Value Ref Range   Potassium 4.6 3.5 - 5.1 mmol/L    Comment: Performed at [facility], [address] [place], [place] [address]  Basic metabolic panel     Status: Abnormal   Collection Time: 11/29/17  5:11 AM  Result Value Ref Range   Sodium 137 135 - 145 mmol/L   Potassium 4.4 3.5 - 5.1 mmol/L   Chloride 108 101 - 111 mmol/L   CO2 20 (L) 22 - 32 mmol/L   Glucose, Bld 131 (H) 65 - 99 mg/dL   BUN 65 (H) 6 - 20 mg/dL   Creatinine, Ser 3.28 (H) 0.44 - 1.00 mg/dL   Calcium 8.3 (L) 8.9 - 10.3 mg/dL   GFR calc non Af Amer 12 (L) >60 mL/min   GFR calc Af Amer 14 (L) >60 mL/min    Comment: (NOTE) The eGFR has been calculated using the CKD EPI equation. This calculation has not been validated in all clinical situations. eGFR's persistently <60 mL/min signify possible Chronic Kidney Disease.    Anion gap 9 5 - 15    Comment: Performed at [facility], [address] [place], [place] [address]  Glucose, capillary     Status: Abnormal   Collection Time: 11/29/17  7:53 AM  Result Value Ref Range   Glucose-Capillary 112 (H) 65 - 99 mg/dL  Gentamicin level, trough  Status: Abnormal   Collection Time: 11/29/17  8:49 AM  Result Value Ref Range   Gentamicin Trough <0.5 (L) 0.5 - 2.0 ug/mL    Comment: Performed at [facility], [address] [place], [place] [address]  Glucose, capillary     Status: None   Collection Time: 11/29/17 11:43 AM  Result Value Ref Range   Glucose-Capillary 86 65 - 99 mg/dL   Comment 1 Notify RN   Glucose, capillary     Status: Abnormal   Collection Time: 11/29/17  4:34 PM  Result Value Ref Range   Glucose-Capillary  138 (H) 65 - 99 mg/dL  Glucose, capillary     Status: Abnormal   Collection Time: 11/29/17  9:11 PM  Result Value Ref Range   Glucose-Capillary 155 (H) 65 - 99 mg/dL   No results found for this or any previous visit (from the past 240 hour(s)). Creatinine: Recent Labs    11/28/17 1642 11/29/17 0511  CREATININE 3.77* 3.28*   Baseline Creatinine: 1.2  Impression/Assessment:  83yo with chronic cystitis, urinary retention and ARF.  Plan:  1. Chronic cystitis: Please continue broad spectrum antibiotics pending urine culture. We will cease gentamicin irrigation. She will be re-evaluated for recurrent UTIs by [facility] as an outpatient 2. Urinary retention: please continue foley catheter to straight drain. She should have the foley for at least 1 week for bladder rest. She should followup with [facility] in 1 week after discharge for possible voiding trial 3. Acute renal failure: likely related to urinary retention and UTI. Please continue to trend creatinine  Nicolette Bang 11/29/2017, 9:58 PM

## 2017-11-30 LAB — GLUCOSE, CAPILLARY
Glucose-Capillary: 74 mg/dL (ref 65–99)
Glucose-Capillary: 250 mg/dL — ABNORMAL HIGH (ref 65–99)

## 2017-11-30 LAB — BASIC METABOLIC PANEL
ANION GAP: 9 (ref 5–15)
BUN: 51 mg/dL — AB (ref 6–20)
CHLORIDE: 111 mmol/L (ref 101–111)
CO2: 20 mmol/L — AB (ref 22–32)
Calcium: 8.2 mg/dL — ABNORMAL LOW (ref 8.9–10.3)
Creatinine, Ser: 2.46 mg/dL — ABNORMAL HIGH (ref 0.44–1.00)
GFR calc Af Amer: 20 mL/min — ABNORMAL LOW (ref >60)
GFR calc non Af Amer: 17 mL/min — ABNORMAL LOW (ref >60)
GLUCOSE: 72 mg/dL (ref 65–99)
POTASSIUM: 4 mmol/L (ref 3.5–5.1)
Sodium: 140 mmol/L (ref 135–145)

## 2017-11-30 LAB — CBC
HEMATOCRIT: 29.5 % — AB (ref 35.0–47.0)
Hemoglobin: 9.6 g/dL — ABNORMAL LOW (ref 12.0–16.0)
MCH: 31.5 pg (ref 26.0–34.0)
MCHC: 32.6 g/dL (ref 32.0–36.0)
MCV: 96.4 fL (ref 80.0–100.0)
Platelets: 179 10*3/uL (ref 150–440)
RBC: 3.06 MIL/uL — AB (ref 3.80–5.20)
RDW: 14.1 % (ref 11.5–14.5)
WBC: 4.6 10*3/uL (ref 3.6–11.0)

## 2017-11-30 LAB — URINE CULTURE: Culture: >=100000 — AB

## 2017-11-30 MED ORDER — NOVOLOG MIX 70/30 FLEXPEN (70-30) 100 UNIT/ML ~~LOC~~ SUPN: 12.0000 [IU] | PEN_INJECTOR | Freq: Two times a day (BID) | SUBCUTANEOUS | 2 refills | Status: DC

## 2017-11-30 MED ORDER — CEPHALEXIN 500 MG PO CAPS: 500.0000 mg | ORAL_CAPSULE | Freq: Four times a day (QID) | ORAL | 0 refills | Status: AC

## 2017-11-30 NOTE — Progress Notes (Signed)
Pamela [name] to be D/C'd Home per MD order.  Discussed prescriptions and follow up appointments with the patient. Prescriptions given to patient, medication list explained in detail. Pt verbalized understanding.  Allergies as of 11/30/2017      Reactions   Ciprofloxacin Anaphylaxis   Sulfamethoxazole-trimethoprim Other (See Comments), Rash   Pamela [name].   Augmentin [amoxicillin-pot Clavulanate] Hives   Pamela [name] Other (See Comments)   Losartan Potassium-hctz Other (See Comments)   Other reaction(s): Unknown   Nitrofurantoin Other (See Comments)   Sulfa Antibiotics Other (See Comments)   Pamela [name]   Sulfasalazine    unknown   Pamela [Pamela [name] In D5w]       Medication List    STOP taking these medications   cefdinir 300 MG capsule Commonly known as:  OMNICEF   lisinopril 20 MG tablet Commonly known as:  PRINIVIL,ZESTRIL   metFORMIN 1000 MG tablet Commonly known as:  GLUCOPHAGE     TAKE these medications   amiodarone 200 MG tablet Commonly known as:  PACERONE TAKE 1 TABLET (200 MG TOTAL) BY MOUTH ONCE DAILY.   amitriptyline 50 MG tablet Commonly known as:  ELAVIL TAKE 1 TABLET (50 MG TOTAL) BY MOUTH NIGHTLY.   atorvastatin 40 MG tablet Commonly known as:  LIPITOR Take 40 mg by mouth daily.   cephALEXin 500 MG capsule Commonly known as:  KEFLEX Take 1 capsule (500 mg total) by mouth 4 (four) times daily for 10 days.   glucose blood test strip 1 each 2 (two) times daily. Reported on 12/18/2015   metoprolol tartrate 50 MG tablet Commonly known as:  LOPRESSOR Take 1 tablet (50 mg total) by mouth 2 (two) times daily.   NOVOLOG MIX 70/30 FLEXPEN (70-30) 100 UNIT/ML FlexPen Generic drug:  insulin aspart protamine - aspart Inject 0.12 mLs (12 Units total) into the skin 2 (two) times daily with a meal. What changed:  See the new instructions.   pantoprazole 40 MG tablet Commonly known as:  PROTONIX Take 40 mg by  mouth daily.   PHARMACIST CHOICE LANCETS Misc USE 2 TIMES DAILY FOR DIABETIC TESTING   tamsulosin 0.4 MG Caps capsule Commonly known as:  FLOMAX Take 0.4 mg by mouth daily. 30 minutes before the same meal       Vitals:   11/30/17 0416 11/30/17 1208  BP: (!) 154/55 (!) 170/66  Pulse: (!) 52 (!) 56  Resp: 20 17  Temp: 97.9 F (36.6 C) 98.4 F (36.9 C)  SpO2: 95% 96%    Skin clean, dry and intact without evidence of skin break down, no evidence of skin tears noted. IV catheter discontinued intact. Site without signs and symptoms of complications. Dressing and pressure applied. Pt denies pain at this time. No complaints noted.  An After Visit Summary was printed and given to the patient. Education was provided for the urinary catheter and supplies were given Patient escorted via WC, and D/C home via private auto.  Pamela [name]

## 2017-11-30 NOTE — Care Management (Signed)
Patient to discharge home today with foley and orders for RN PT and Aide.  [facility] is the only agency that will accept patient's medicare uhc plan.  Confirmed contact information.  Referral called to [facility] and accepted

## 2017-11-30 NOTE — Care Management Important Message (Signed)
Important Message  Patient Details  Name: Pamela [name] MRN: [id number] Date of Birth: 02/03/1934   Medicare Important Message Given:  N/A - LOS <3 / Initial given by admissions    Eber HongGreene, Poetry Cerro R, RN 11/30/2017, 10:04 AM

## 2017-11-30 NOTE — Discharge Summary (Signed)
[facility] - [place] at [facility]   PATIENT NAME: Pamela KirkGloria [name]    MR#:  [id number]  DATE OF BIRTH:  01/21/1934  DATE OF ADMISSION:  11/28/2017 ADMITTING PHYSICIAN: Bertrum SolMontell D Salary, MD  DATE OF DISCHARGE: 11/30/2017  PRIMARY CARE PHYSICIAN: Mick SellFitzgerald, David P, MD    ADMISSION DIAGNOSIS:  AKI (acute kidney injury) (HCC) [N17.9] Acute urinary retention [R33.8]  DISCHARGE DIAGNOSIS:  Active Problems:   UTI (urinary tract infection)   SECONDARY DIAGNOSIS:   Past Medical History:  Diagnosis Date  . A-fib (HCC) 08/28/2014   Overview:  Overview:  Diastolic dysfunction on ECHO 04/2012-[facility]   . Abnormal gait 02/08/2012  . Abscess or cellulitis of leg 04/18/2012  . Absence of bladder continence 07/29/2012  . Acid reflux 02/08/2012  . Acute kidney failure (HCC) 05/15/2012  . Afib, paroxysmal, with RVR at times with BBB 05/13/2012  . Allergic drug rash 05/15/2012  . Anemia 05/13/2012  . 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 Benign essential HTN 02/08/2012  . Bladder infection, chronic 06/29/2013  . BP (high blood pressure) 05/12/2012  . Bursitis of knee 08/07/2014  . Cellulitis in diabetic foot (HCC) 05/12/2012  . Chronic kidney disease 06/01/2012   Overview:  Overview:   12/2012- Renal US, simple L cyst. UIEP normal   . Clinical depression 08/28/2014  . CN (constipation) 03/17/2012  . Dermatitis due to drug reaction 05/15/2012  . Diabetes mellitus   . Diastolic dysfunction, left ventricle 05/14/2012  . DM (diabetes mellitus) (HCC) 05/12/2012  . Dysrhythmia   . Dysuria   . Edema leg 07/02/2014  . Enthesopathy of knee 08/07/2014  . Excess fluid volume 05/20/2012  . FOM (frequency of micturition) 07/29/2012  . Frank hematuria 09/27/2014  . Gangrene (HCC)   . GERD (gastroesophageal reflux disease)   . Heart disease   . High cholesterol   . HTN (hypertension) 05/12/2012  . Hypercholesterolemia 05/12/2012  . Hypertension   . Incomplete bladder emptying 07/29/2012  . Open  wound of knee, leg (except thigh), and ankle 06/01/2012  . 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   Overview:  Overview:  Several years ago, allergic reaction to sulfa antibiotic Several years ago, allergic reaction to sulfa antibiotic     HOSPITAL COURSE:   84 year old female with multiple medical problems including chronic diastolic heart failure, diabetes and hypertension currently being followed with urology and receiving gentamicin bladder instillations who presents with urinary retention.   1. Acute on chronic urinary retention: Patient has a Foley catheter now and will need this for atleast 1 week. She have outpatient voiding trial Continue Flomax She ws evaluated by UROLOGY while in the hospital.  2. Acute on chronic kidney disease stage III: This is due to urinary retention and has improved. Metoformin and LISINOPRIl have been discontinued for now. She needs BMP on Monday. Home health can draw labs and send to PCP office. Renal ultrasound shows medical renal disease with very mild hydronephrosis.  3. Hyperkalemia due to acute kidney injury which is improved  4. Urinary tract infection: She will continue Keflex at discharge. She was on CEFDNIR prior to this. She is not having dysuria. I am suspecting she had UTI from urinary retention. Urine cultures are pending and we ask that PCP follow up on this.  5. Diabetes: NPH was decreased to reflect her kidney function. She should continue ADA diet and Metformin has been stopped.  6. Essential hypertension: Continue metoprolol  7. PAF: Continue amiodarone and metoprolol for heart rate control  8.Hyperlipidemia: Continue statin  9. Chronic diastolic heart failure without signs of exacerbation     DISCHARGE CONDITIONS AND DIET:   Stable Diabetic heart healthy diet  CONSULTS OBTAINED:  Treatment Team:  Malen Gauze, MD Mosetta Pigeon, MD  DRUG ALLERGIES:   Allergies  Allergen Reactions  .  Ciprofloxacin Anaphylaxis  . Sulfamethoxazole-Trimethoprim Other (See Comments) and Rash    Went into [employer].  . Augmentin [Amoxicillin-Pot Clavulanate] Hives  . Levofloxacin Other (See Comments)  . Losartan Potassium-Hctz Other (See Comments)    Other reaction(s): Unknown  . Nitrofurantoin Other (See Comments)  . Sulfa Antibiotics Other (See Comments)    Levonne Spiller Steven's Johnsons  . Sulfasalazine     unknown  . Levaquin [Levofloxacin In D5w]     DISCHARGE MEDICATIONS:   Allergies as of 11/30/2017      Reactions   Ciprofloxacin Anaphylaxis   Sulfamethoxazole-trimethoprim Other (See Comments), Rash   Went into [employer] Sydrome.   Augmentin [amoxicillin-pot Clavulanate] Hives   Levofloxacin Other (See Comments)   Losartan Potassium-hctz Other (See Comments)   Other reaction(s): Unknown   Nitrofurantoin Other (See Comments)   Sulfa Antibiotics Other (See Comments)   Levonne Spiller Steven's Johnsons   Sulfasalazine    unknown   Levaquin [levofloxacin In D5w]       Medication List    STOP taking these medications   cefdinir 300 MG capsule Commonly known as:  OMNICEF   lisinopril 20 MG tablet Commonly known as:  PRINIVIL,ZESTRIL   metFORMIN 1000 MG tablet Commonly known as:  GLUCOPHAGE     TAKE these medications   amiodarone 200 MG tablet Commonly known as:  PACERONE TAKE 1 TABLET (200 MG TOTAL) BY MOUTH ONCE DAILY.   amitriptyline 50 MG tablet Commonly known as:  ELAVIL TAKE 1 TABLET (50 MG TOTAL) BY MOUTH NIGHTLY.   atorvastatin 40 MG tablet Commonly known as:  LIPITOR Take 40 mg by mouth daily.   cephALEXin 500 MG capsule Commonly known as:  KEFLEX Take 1 capsule (500 mg total) by mouth 4 (four) times daily for 10 days.   glucose blood test strip 1 each 2 (two) times daily. Reported on 12/18/2015   metoprolol tartrate 50 MG tablet Commonly known as:  LOPRESSOR Take 1 tablet (50 mg total) by mouth 2 (two) times daily.    NOVOLOG MIX 70/30 FLEXPEN (70-30) 100 UNIT/ML FlexPen Generic drug:  insulin aspart protamine - aspart Inject 0.12 mLs (12 Units total) into the skin 2 (two) times daily with a meal. What changed:  See the new instructions.   pantoprazole 40 MG tablet Commonly known as:  PROTONIX Take 40 mg by mouth daily.   PHARMACIST CHOICE LANCETS Misc USE 2 TIMES DAILY FOR DIABETIC TESTING   tamsulosin 0.4 MG Caps capsule Commonly known as:  FLOMAX Take 0.4 mg by mouth daily. 30 minutes before the same meal         Today   CHIEF COMPLAINT:  Doing well this am no dysuria or SOB   VITAL SIGNS:  Blood pressure (!) 154/55, pulse (!) 52, temperature 97.9 F (36.6 C), temperature source Oral, resp. rate 20, weight 65.8 kg (145 lb), SpO2 95 %.   REVIEW OF SYSTEMS:  Review of Systems  Constitutional: Negative.  Negative for chills, fever and malaise/fatigue.  HENT: Negative.  Negative for ear discharge, ear pain, hearing loss, nosebleeds and sore throat.   Eyes: Negative.  Negative for blurred vision and pain.  Respiratory: Negative.  Negative for cough, hemoptysis, shortness of breath and wheezing.   Cardiovascular: Negative.  Negative for chest pain, palpitations and leg swelling.  Gastrointestinal: Negative.  Negative for abdominal pain, blood in stool, diarrhea, nausea and vomiting.  Genitourinary: Negative.  Negative for dysuria.  Musculoskeletal: Negative.  Negative for back pain.  Skin: Negative.   Neurological: Negative for dizziness, tremors, speech change, focal weakness, seizures and headaches.  Endo/Heme/Allergies: Negative.  Does not bruise/bleed easily.  Psychiatric/Behavioral: Negative.  Negative for depression, hallucinations and suicidal ideas.     PHYSICAL EXAMINATION:  GENERAL:  84 y.o.-year-old patient lying in the bed with no acute distress.  NECK:  Supple, no jugular venous distention. No thyroid enlargement, no tenderness.  LUNGS: Normal breath sounds  bilaterally, no wheezing, rales,rhonchi  No use of accessory muscles of respiration.  CARDIOVASCULAR: S1, S2 normal. No murmurs, rubs, or gallops.  ABDOMEN: Soft, non-tender, non-distended. Bowel sounds present. No organomegaly or mass.  EXTREMITIES: No pedal edema, cyanosis, or clubbing.  PSYCHIATRIC: The patient is alert and oriented x 3.  SKIN: No obvious rash, lesion, or ulcer.   DATA REVIEW:   CBC Recent Labs  Lab 11/30/17 0502  WBC 4.6  HGB 9.6*  HCT 29.5*  PLT 179    Chemistries  Recent Labs  Lab 11/28/17 1642  11/30/17 0502  NA 131*   < > 140  K 5.6*   < > 4.0  CL 100*   < > 111  CO2 19*   < > 20*  GLUCOSE 207*   < > 72  BUN 71*   < > 51*  CREATININE 3.77*   < > 2.46*  CALCIUM 9.0   < > 8.2*  AST 27  --   --   ALT 15  --   --   ALKPHOS 68  --   --   BILITOT 0.9  --   --    < > = values in this interval not displayed.    Cardiac Enzymes No results for input(s): TROPONINI in the last 168 hours.  Microbiology Results  @MICRORSLT48 @  RADIOLOGY:  US Renal  Result Date: 11/29/2017 CLINICAL DATA:  Follow-up hydronephrosis EXAM: RENAL / URINARY TRACT ULTRASOUND COMPLETE COMPARISON:  CT scan of the abdomen of March 10, 2017 FINDINGS: Right Kidney: Length: 10 cm. The renal cortical echotexture is approximately equal to that of the liver. There is a small amount of perinephric fluid. No stones are observed. Left Kidney: Length: 10.9 cm. The renal cortical echotexture is similar to that on the right. There is a mid to lower pole cortical cyst measuring 3.5 x 2.3 x 3.3 cm. There is mild splitting of the central echo complex. There is a small amount of perinephric fluid. Bladder: The urinary bladder is decompressed with a Foley catheter. IMPRESSION: Increased renal cortical echotexture bilaterally compatible with medical renal disease. There are small amounts of perinephric fluid. Minimal hydronephrosis on the left. Simple appearing cortical cyst in the mid to lower pole of  the left kidney measuring 3.5 cm in greatest dimension. Electronically Signed   By: David  [place] M.D.   On: 11/29/2017 11:29      Allergies as of 11/30/2017      Reactions   Ciprofloxacin Anaphylaxis   Sulfamethoxazole-trimethoprim Other (See Comments), Rash   Went into [employer] Sydrome.   Augmentin [amoxicillin-pot Clavulanate] Hives   Levofloxacin Other (See Comments)   Losartan Potassium-hctz Other (See Comments)   Other reaction(s): Unknown   Nitrofurantoin Other (See Comments)  Sulfa Antibiotics Other (See Comments)   Levonne Spiller Steven's Johnsons   Sulfasalazine    unknown   Levaquin [levofloxacin In D5w]       Medication List    STOP taking these medications   cefdinir 300 MG capsule Commonly known as:  OMNICEF   lisinopril 20 MG tablet Commonly known as:  PRINIVIL,ZESTRIL   metFORMIN 1000 MG tablet Commonly known as:  GLUCOPHAGE     TAKE these medications   amiodarone 200 MG tablet Commonly known as:  PACERONE TAKE 1 TABLET (200 MG TOTAL) BY MOUTH ONCE DAILY.   amitriptyline 50 MG tablet Commonly known as:  ELAVIL TAKE 1 TABLET (50 MG TOTAL) BY MOUTH NIGHTLY.   atorvastatin 40 MG tablet Commonly known as:  LIPITOR Take 40 mg by mouth daily.   cephALEXin 500 MG capsule Commonly known as:  KEFLEX Take 1 capsule (500 mg total) by mouth 4 (four) times daily for 10 days.   glucose blood test strip 1 each 2 (two) times daily. Reported on 12/18/2015   metoprolol tartrate 50 MG tablet Commonly known as:  LOPRESSOR Take 1 tablet (50 mg total) by mouth 2 (two) times daily.   NOVOLOG MIX 70/30 FLEXPEN (70-30) 100 UNIT/ML FlexPen Generic drug:  insulin aspart protamine - aspart Inject 0.12 mLs (12 Units total) into the skin 2 (two) times daily with a meal. What changed:  See the new instructions.   pantoprazole 40 MG tablet Commonly known as:  PROTONIX Take 40 mg by mouth daily.   PHARMACIST CHOICE LANCETS Misc USE 2 TIMES DAILY FOR DIABETIC  TESTING   tamsulosin 0.4 MG Caps capsule Commonly known as:  FLOMAX Take 0.4 mg by mouth daily. 30 minutes before the same meal          Management plans discussed with the patient and she is in agreement. Stable for discharge home w [facility]  Patient should follow up with urology  CODE STATUS:     Code Status Orders  (From admission, onward)        Start     Ordered   11/28/17 2110  Full code  Continuous     11/28/17 2109    Code Status History    Date Active Date Inactive Code Status Order ID Comments User Context   05/13/2012 02:18 05/23/2012 19:20 Full Code [id number]  [facility]    Advance Directive Documentation     Most Recent Value  Type of Advance Directive  Healthcare Power of Attorney, Living will  Pre-existing out of facility DNR order (yellow form or pink MOST form)  No data  "MOST" Form in Place?  No data      TOTAL TIME TAKING CARE OF THIS PATIENT: 39 minutes.    Note: This dictation was prepared with Dragon dictation along with smaller phrase technology. Any transcriptional errors that result from this process are unintentional.  Marialuisa Basara M.D on 11/30/2017 at 9:04 AM  Between 7am to 6pm - Pager - [phone] After 6pm go to [email or web address] - Social research officer, government  [facility]  Office  [phone]  CC: Primary care physician; Mick Sell, MD

## 2017-12-14 NOTE — Progress Notes (Signed)
12/15/2017 11:46 PM   Pamela [name] 07/21/1934 [id number]  Referring provider: Leonel Ramsay, MD [place], [place] [id number]  Chief Complaint  Patient presents with  . Follow-up    ER    HPI: Patient is a 84 year old Caucasian female with recurrent UTI's, mixed incontinence and vaginal atrophy who presents today for a TOV with her son, Elta Guadeloupe.    Urinary retention Patient presented to the ED on 11/28/2017 after not being able to void for 24 hours.  2.3 L were obtained after Foley was placed.  Urine culture was positive for yeast.  Her most recent serum creatinine was 2.46 on 11/30/2017.  Foley removed for TOV today.    Recurrent UTI Patient was undergoing gentamicin instillations three times weekly in our office.  She has an upcoming appointment with Dr. Ola Spurr on 12/23/2017.     Bladder wall thickening A CT of the pelvis was performed on 04/14/2012 and it noted no hydronephrosis, but irregular thickened bladder wall with debris within the bladder and left renal cysts.  Patient underwent a cystoscopy on 09/27/2014 with Dr. Bernardo Heater and no abnormalities were noted.  Renal ultrasound completed on 09/29/2016 noted that the urinary bladder wall is irregularly thickened with increased vascularity questioning significant inflammatory change versus possible neoplastic infiltration within the urinary bladder. And a left renal cyst which appears slightly larger than previous study.   Patient underwent cystoscopy on 03/16/2017 with Dr. Junious Silk and bladder was irrigated of debris and two small diverticulum where inspected.  No tumors were seen.    Incontinence Patient was not a candidate for anticholinergics due to her memory issues.  She did go through a series of 12 weekly PTNS, but she has missed several maintenance PTNS.  Her baseline urinary symptoms consist of frequency, urgency, nocturia, straining to urinate, hesitancy and a weak urinary stream.  She is going  through 7 to 8 pads daily.  She has a pessary in place.  She did not find the Myrbetriq effective.    She has not been seen since 02/2017 by gynecology for pessary removal and cleansing.     PMH: Past Medical History:  Diagnosis Date  . A-fib ([place]) 08/28/2014   Overview:  Overview:  Diastolic dysfunction on ECHO 04/2012-done at [facility]   . Abnormal gait 02/08/2012  . Abscess or cellulitis of leg 04/18/2012  . Absence of bladder continence 07/29/2012  . Acid reflux 02/08/2012  . Acute kidney failure ([place]) 05/15/2012  . Afib, paroxysmal, with RVR at times with BBB 05/13/2012  . Allergic drug rash 05/15/2012  . Anemia 05/13/2012  . 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 Benign essential HTN 02/08/2012  . Bladder infection, chronic 06/29/2013  . BP (high blood pressure) 05/12/2012  . Bursitis of knee 08/07/2014  . Cellulitis in diabetic foot ([place]) 05/12/2012  . Chronic kidney disease 06/01/2012   Overview:  Overview:   12/2012- Renal US, simple L cyst. UIEP normal   . Clinical depression 08/28/2014  . CN (constipation) 03/17/2012  . Dermatitis due to drug reaction 05/15/2012  . Diabetes mellitus   . Diastolic dysfunction, left ventricle 05/14/2012  . DM (diabetes mellitus) ([place]) 05/12/2012  . Dysrhythmia   . Dysuria   . Edema leg 07/02/2014  . Enthesopathy of knee 08/07/2014  . Excess fluid volume 05/20/2012  . FOM (frequency of micturition) 07/29/2012  . Frank hematuria 09/27/2014  . Gangrene ([place])   . GERD (gastroesophageal reflux disease)   . Heart disease   .  High cholesterol   . HTN (hypertension) 05/12/2012  . Hypercholesterolemia 05/12/2012  . Hypertension   . Incomplete bladder emptying 07/29/2012  . Open wound of knee, leg (except thigh), and ankle 06/01/2012  . Stevens-Johnson syndrome ([place]) 05/15/2012   Overview:  Overview:  Several years ago, allergic reaction to sulfa antibiotic Several years ago, allergic reaction to sulfa antibiotic     Surgical History: Past Surgical History:  Procedure  Laterality Date  . ABDOMINAL HYSTERECTOMY    . BLADDER SURGERY    . cataract surgery    . ENDOVENOUS ABLATION SAPHENOUS VEIN W/ LASER    . NASAL SINUS SURGERY    . TOE AMPUTATION    . TONSILLECTOMY      Home Medications:  Allergies as of 12/15/2017      Reactions   Ciprofloxacin Anaphylaxis   Sulfamethoxazole-trimethoprim Other (See Comments), Rash   Went into [employer] Sydrome.   Augmentin [amoxicillin-pot Clavulanate] Hives   Levofloxacin Other (See Comments)   Losartan Potassium-hctz Other (See Comments)   Other reaction(s): Unknown   Nitrofurantoin Other (See Comments)   Sulfa Antibiotics Other (See Comments)   Kathreen Cosier Steven's Johnsons   Sulfasalazine    unknown   Levaquin [levofloxacin In D5w]       Medication List        Accurate as of 12/15/17 11:59 PM. Always use your most recent med list.          amiodarone 200 MG tablet Commonly known as:  PACERONE TAKE 1 TABLET (200 MG TOTAL) BY MOUTH ONCE DAILY.   amitriptyline 50 MG tablet Commonly known as:  ELAVIL TAKE 1 TABLET (50 MG TOTAL) BY MOUTH NIGHTLY.   atorvastatin 40 MG tablet Commonly known as:  LIPITOR Take 40 mg by mouth daily.   glucose blood test strip 1 each 2 (two) times daily. Reported on 12/18/2015   metoprolol tartrate 50 MG tablet Commonly known as:  LOPRESSOR Take 1 tablet (50 mg total) by mouth 2 (two) times daily.   NOVOLOG MIX 70/30 FLEXPEN (70-30) 100 UNIT/ML FlexPen Generic drug:  insulin aspart protamine - aspart Inject 0.12 mLs (12 Units total) into the skin 2 (two) times daily with a meal.   pantoprazole 40 MG tablet Commonly known as:  PROTONIX Take 40 mg by mouth daily.   PHARMACIST CHOICE LANCETS Misc USE 2 TIMES DAILY FOR DIABETIC TESTING   tamsulosin 0.4 MG Caps capsule Commonly known as:  FLOMAX Take 0.4 mg by mouth daily. 30 minutes before the same meal       Allergies:  Allergies  Allergen Reactions  . Ciprofloxacin Anaphylaxis  .  Sulfamethoxazole-Trimethoprim Other (See Comments) and Rash    Went into [employer].  . Augmentin [Amoxicillin-Pot Clavulanate] Hives  . Levofloxacin Other (See Comments)  . Losartan Potassium-Hctz Other (See Comments)    Other reaction(s): Unknown  . Nitrofurantoin Other (See Comments)  . Sulfa Antibiotics Other (See Comments)    Kathreen Cosier Steven's Johnsons  . Sulfasalazine     unknown  . Levaquin [Levofloxacin In D5w]     Family History: Family History  Problem Relation Age of Onset  . Diabetes Mellitus II Mother   . Heart disease Mother   . Lung cancer Father   . Diabetes Paternal Grandmother   . Diabetes Maternal Grandmother   . Kidney disease Neg Hx   . Prostate cancer Neg Hx   . Bladder Cancer Neg Hx     Social History:  reports that  has  never smoked. she has never used smokeless tobacco. She reports that she does not drink alcohol or use drugs.  ROS: UROLOGY Frequent Urination?: No Hard to postpone urination?: No Burning/pain with urination?: No Get up at night to urinate?: No Leakage of urine?: No Urine stream starts and stops?: No Trouble starting stream?: Yes Do you have to strain to urinate?: Yes Blood in urine?: No Urinary tract infection?: Yes Sexually transmitted disease?: No Injury to kidneys or bladder?: No Painful intercourse?: No Weak stream?: No Currently pregnant?: No Vaginal bleeding?: No Last menstrual period?: n  Gastrointestinal Nausea?: No Vomiting?: No Indigestion/heartburn?: No Diarrhea?: No Constipation?: No  Constitutional Fever: No Night sweats?: No Weight loss?: No Fatigue?: No  Skin Skin rash/lesions?: No Itching?: No  Eyes Blurred vision?: No Double vision?: No  Ears/Nose/Throat Sore throat?: No Sinus problems?: No  Hematologic/Lymphatic Swollen glands?: No Easy bruising?: No  Cardiovascular Leg swelling?: No Chest pain?: No  Respiratory Cough?: No Shortness of breath?:  No  Endocrine Excessive thirst?: No  Musculoskeletal Back pain?: No Joint pain?: No  Neurological Headaches?: No Dizziness?: No  Psychologic Depression?: No Anxiety?: No  Physical Exam: BP 129/80   Pulse 84   Ht 5' 7" (1.702 m)   Wt 144 lb (65.3 kg)   BMI 22.55 kg/m   Constitutional: Well nourished. Alert and oriented, No acute distress. HEENT: [place] AT, moist mucus membranes. Trachea midline, no masses. Cardiovascular: No clubbing, cyanosis, or edema. Respiratory: Normal respiratory effort, no increased work of breathing. GI: Abdomen is soft, non tender, non distended, no abdominal masses. Liver and spleen not palpable.  No hernias appreciated.  Stool sample for occult testing is not indicated.   GU: No CVA tenderness.  No bladder fullness or masses.  Atrophic external genitalia, normal pubic hair distribution, no lesions.  Normal urethral meatus, no lesions, no prolapse, no discharge.   No urethral masses, tenderness and/or tenderness. No bladder fullness, tenderness or masses. Pessary in place.   Skin: No rashes, bruises or suspicious lesions. Lymph: No cervical or inguinal adenopathy. Neurologic: Grossly intact, no focal deficits, moving all 4 extremities. Psychiatric: Normal mood and affect.  Laboratory Data See above  I have reviewed the labs.  Assessment & Plan:    1. Acute urinary retention:     - foley catheter removed  -voiding trial today    -return if unable to urinate or experiencing suprapubic discomfort  -follow-up in one month for OAB questionnaire and PVR  2. History of recurrent UTI's  - criteria for recurrent UTI has been met with 2 or more infections in 6 months or 3 or greater infections in one year   - Patient is instructed to increase their water intake until the urine is pale yellow or clear (10 to 12 cups daily)   - probiotics (yogurt, oral pills or vaginal suppositories), take cranberry pills or drink the juice and Vitamin C 1,000 mg daily  to acidify the urine should be added to their daily regimen   - avoid soaking in tubs and wipe front to back after urinating   - advised them to have CATH UA's for urinalysis and culture to prevent skin contamination of the specimen  - reviewed symptoms of UTI and advised not to have urine checked or be treated for UTI if not experiencing symptoms  - discussed antibiotic stewardship with the patient    - patient seeing Dr. Ola Spurr on the 24th - waiting on his recommendations    3. Vaginal atrophy  -  continue Vagi fem ring in place - needs to schedule follow up with Dr. Kenton Kingfisher  4. Cystocele  - needs to schedule follow up with Dr. Kenton Kingfisher regarding pessary care                                          Return in about 1 month (around 01/15/2018) for PVR and OAB questionnaire.  These notes generated with voice recognition software. I apologize for typographical errors.  Zara Council, [facility] [address] [place], [place] [address] [phone]

## 2017-12-15 ENCOUNTER — Ambulatory Visit (INDEPENDENT_AMBULATORY_CARE_PROVIDER_SITE_OTHER): Payer: Medicare Other | Admitting: Urology

## 2017-12-15 ENCOUNTER — Encounter: Payer: Self-pay | Admitting: Urology

## 2017-12-15 VITALS — BP 129/80 | HR 84 | Ht 67.0 in | Wt 144.0 lb

## 2017-12-15 DIAGNOSIS — N3946 Mixed incontinence: Secondary | ICD-10-CM | POA: Diagnosis not present

## 2017-12-15 DIAGNOSIS — N952 Postmenopausal atrophic vaginitis: Secondary | ICD-10-CM | POA: Diagnosis not present

## 2017-12-15 DIAGNOSIS — R339 Retention of urine, unspecified: Secondary | ICD-10-CM

## 2017-12-15 DIAGNOSIS — N390 Urinary tract infection, site not specified: Secondary | ICD-10-CM | POA: Diagnosis not present

## 2017-12-15 NOTE — Patient Instructions (Signed)
                                             Urinary Tract Infection Prevention Patient Education Stay Hydrated: Urinary tract infections (UTIs) are less likely to occur in someone who is drinking enough water to promote regular urination, so it is very important to stay hydrated in order to help flush out bacteria from the urinary tract. Respond to "Nature's Call": It is always a good idea to urinate as soon as you feel the need. While "holding it in" does not directly cause an infection, it can cause overdistension that can damage the lining of the bladder, making it more vulnerable to bacteria. Remove Tampons Before Going: Remember to always take out tampons before urinating, and change tampons often.  Practice Proper Bathroom Hygiene: To keep bacteria near the urethral opening to a minimum, it is important to practice proper wiping techniques (i.e. front to back wiping) to help prevent rectal bacteria from entering the uretro-genital area. It can also be helpful to take showers and avoid soaking in the bathtub.  Take a Vitamin C Supplement: About 1,000 milligrams of vitamin C taken daily can help inhibit the growth of some bacteria by acidifying the urine. Maintain Control with Cranberries: Cranberries contain hippuronic acid, which is a natural antiseptic that may help prevent the adherence of bacteria to the bladder lining. Drinking 100% pure cranberry juice or taking over the counter cranberry supplements twice daily may help to prevent an infection. However, it is important to note that cranberry juices/supplements are not helpful once a urinary tract infection (UTI) is present. Strengthen Your Core: Often, a lazy bladder (unable to empty urine properly) occurs due to lower back problem, so consider doing exercises to help strengthen your back, pelvic floor, and stomach muscles. D-[place] Pay Attention to Your Urine: Your urine can change color for a variety of reasons, including from the  medications you take, so pay close attention to it to monitor your overall health. One key thing to note is that if your urine is typically a darker yellow, your body is dehydrated, so you need to step up your water intake.    

## 2017-12-18 ENCOUNTER — Other Ambulatory Visit: Payer: Self-pay

## 2017-12-18 ENCOUNTER — Emergency Department
Admission: EM | Admit: 2017-12-18 | Discharge: 2017-12-19 | Disposition: A | Discharge status: Home / Self Care | Payer: Medicare Other | Attending: Emergency Medicine | Admitting: Emergency Medicine

## 2017-12-18 DIAGNOSIS — Z882 Allergy status to sulfonamides status: Secondary | ICD-10-CM | POA: Diagnosis not present

## 2017-12-18 DIAGNOSIS — Z89429 Acquired absence of other toe(s), unspecified side: Secondary | ICD-10-CM | POA: Diagnosis not present

## 2017-12-18 DIAGNOSIS — N3090 Cystitis, unspecified without hematuria: Secondary | ICD-10-CM | POA: Diagnosis not present

## 2017-12-18 DIAGNOSIS — Z8679 Personal history of other diseases of the circulatory system: Secondary | ICD-10-CM | POA: Insufficient documentation

## 2017-12-18 DIAGNOSIS — I129 Hypertensive chronic kidney disease with stage 1 through stage 4 chronic kidney disease, or unspecified chronic kidney disease: Secondary | ICD-10-CM | POA: Insufficient documentation

## 2017-12-18 DIAGNOSIS — E1122 Type 2 diabetes mellitus with diabetic chronic kidney disease: Secondary | ICD-10-CM | POA: Diagnosis not present

## 2017-12-18 DIAGNOSIS — R338 Other retention of urine: Secondary | ICD-10-CM | POA: Insufficient documentation

## 2017-12-18 DIAGNOSIS — N189 Chronic kidney disease, unspecified: Secondary | ICD-10-CM | POA: Insufficient documentation

## 2017-12-18 LAB — CBC WITH DIFFERENTIAL/PLATELET
BASOS ABS: 0.1 10*3/uL (ref 0–0.1)
BASOS PCT: 1 %
EOS ABS: 0 10*3/uL (ref 0–0.7)
EOS PCT: 0 %
HCT: 34 % — ABNORMAL LOW (ref 35.0–47.0)
HEMOGLOBIN: 11.1 g/dL — AB (ref 12.0–16.0)
Lymphocytes Relative: 6 %
Lymphs Abs: 0.6 10*3/uL — ABNORMAL LOW (ref 1.0–3.6)
MCH: 32.1 pg (ref 26.0–34.0)
MCHC: 32.6 g/dL (ref 32.0–36.0)
MCV: 98.2 fL (ref 80.0–100.0)
Monocytes Absolute: 0.7 10*3/uL (ref 0.2–0.9)
Monocytes Relative: 7 %
NEUTROS PCT: 86 %
Neutro Abs: 8.7 10*3/uL — ABNORMAL HIGH (ref 1.4–6.5)
PLATELETS: 190 10*3/uL (ref 150–440)
RBC: 3.46 MIL/uL — AB (ref 3.80–5.20)
RDW: 14.9 % — ABNORMAL HIGH (ref 11.5–14.5)
WBC: 10.1 10*3/uL (ref 3.6–11.0)

## 2017-12-18 LAB — URINALYSIS, COMPLETE (UACMP) WITH MICROSCOPIC
BILIRUBIN URINE: NEGATIVE
GLUCOSE, UA: NEGATIVE mg/dL
KETONES UR: NEGATIVE mg/dL
NITRITE: NEGATIVE
PH: 5 (ref 5.0–8.0)
PROTEIN: NEGATIVE mg/dL
Specific Gravity, Urine: 1.008 (ref 1.005–1.030)

## 2017-12-18 LAB — COMPREHENSIVE METABOLIC PANEL
ALBUMIN: 3.6 g/dL (ref 3.5–5.0)
ALK PHOS: 65 U/L (ref 38–126)
ALT: 15 U/L (ref 14–54)
AST: 28 U/L (ref 15–41)
Anion gap: 12 (ref 5–15)
BUN: 54 mg/dL — ABNORMAL HIGH (ref 6–20)
CHLORIDE: 103 mmol/L (ref 101–111)
CO2: 19 mmol/L — ABNORMAL LOW (ref 22–32)
CREATININE: 2.58 mg/dL — AB (ref 0.44–1.00)
Calcium: 9 mg/dL (ref 8.9–10.3)
GFR calc non Af Amer: 16 mL/min — ABNORMAL LOW (ref >60)
GFR, EST AFRICAN AMERICAN: 19 mL/min — AB (ref >60)
GLUCOSE: 221 mg/dL — AB (ref 65–99)
Potassium: 5.6 mmol/L — ABNORMAL HIGH (ref 3.5–5.1)
SODIUM: 134 mmol/L — AB (ref 135–145)
Total Bilirubin: 0.8 mg/dL (ref 0.3–1.2)
Total Protein: 6.6 g/dL (ref 6.5–8.1)

## 2017-12-18 MED ORDER — CEFTRIAXONE SODIUM IN DEXTROSE 20 MG/ML IV SOLN
1.0000 g | Freq: Once | INTRAVENOUS | Status: AC
  Administered 2017-12-18: 1 g via INTRAVENOUS
  Filled 2017-12-18: qty 50

## 2017-12-18 MED ORDER — FLUCONAZOLE 100 MG PO TABS: 100.0000 mg | ORAL_TABLET | Freq: Every day | ORAL | 0 refills | Status: AC

## 2017-12-18 NOTE — ED Provider Notes (Addendum)
[facility] Emergency Department Provider Note       Time seen: ----------------------------------------- 9:58 PM on 12/18/2017 -----------------------------------------   I have reviewed the triage vital signs and the nursing notes.  HISTORY   Chief Complaint Urinary Retention    HPI Pamela [name] is a 84 y.o. female with a history of GERD, renal failure, anemia, chronic kidney disease, depression, and urinary retention who presents to the ED for urinary retention.  Patient had a Foley catheter removed 2 days ago and has been unable to void since yesterday.  Today she could not void at all and was found to have around a liter on bladder scan.  She denies fevers, chills or other complaints.  She was recently admitted into the hospital for similar.  Past Medical History:  Diagnosis Date  . A-fib (HCC) 08/28/2014   Overview:  Overview:  Diastolic dysfunction on ECHO 04/2012-done at [facility]   . Abnormal gait 02/08/2012  . Abscess or cellulitis of leg 04/18/2012  . Absence of bladder continence 07/29/2012  . Acid reflux 02/08/2012  . Acute kidney failure (HCC) 05/15/2012  . Afib, paroxysmal, with RVR at times with BBB 05/13/2012  . Allergic drug rash 05/15/2012  . Anemia 05/13/2012  . 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 Benign essential HTN 02/08/2012  . Bladder infection, chronic 06/29/2013  . BP (high blood pressure) 05/12/2012  . Bursitis of knee 08/07/2014  . Cellulitis in diabetic foot (HCC) 05/12/2012  . Chronic kidney disease 06/01/2012   Overview:  Overview:   12/2012- Renal US, simple L cyst. UIEP normal   . Clinical depression 08/28/2014  . CN (constipation) 03/17/2012  . Dermatitis due to drug reaction 05/15/2012  . Diabetes mellitus   . Diastolic dysfunction, left ventricle 05/14/2012  . DM (diabetes mellitus) (HCC) 05/12/2012  . Dysrhythmia   . Dysuria   . Edema leg 07/02/2014  . Enthesopathy of knee 08/07/2014  . Excess fluid volume 05/20/2012  . FOM  (frequency of micturition) 07/29/2012  . Frank hematuria 09/27/2014  . Gangrene (HCC)   . GERD (gastroesophageal reflux disease)   . Heart disease   . High cholesterol   . HTN (hypertension) 05/12/2012  . Hypercholesterolemia 05/12/2012  . Hypertension   . Incomplete bladder emptying 07/29/2012  . Open wound of knee, leg (except thigh), and ankle 06/01/2012  . 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   Overview:  Overview:  Several years ago, allergic reaction to sulfa antibiotic Several years ago, allergic reaction to sulfa antibiotic     Patient Active Problem List   Diagnosis Date Noted  . UTI (urinary tract infection) 11/28/2017  . Pelvic muscle wasting 01/26/2017  . Cystocele, midline 01/26/2017  . Urinary frequency 12/26/2015  . Incontinence 12/07/2015  . Cystocele 12/07/2015  . Tinea cruris 12/07/2015  . Recurrent UTI 08/11/2015  . Vaginal atrophy 08/11/2015  . Fall 08/11/2015  . 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 Discoordination 05/08/2015  . Frank hematuria 09/27/2014  . A-fib (HCC) 08/28/2014  . Clinical depression 08/28/2014  . Atrial fibrillation (HCC) 08/28/2014  . Major depressive disorder with single episode 08/28/2014  . Bursitis of knee 08/07/2014  . Enthesopathy of knee 08/07/2014  . Edema leg 07/02/2014  . Bladder infection, chronic 06/29/2013  . Incomplete bladder emptying 07/29/2012  . FOM (frequency of micturition) 07/29/2012  . Absence of bladder continence 07/29/2012  . Chronic kidney disease 06/01/2012  . Open wound of knee, leg (except thigh), and ankle 06/01/2012  . Skin rash 05/20/2012  . Fluid overload  05/20/2012  . Excess fluid volume 05/20/2012  . Acute kidney failure (HCC) 05/15/2012  . Allergic drug rash 05/15/2012  . 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  . Dermatitis due to drug taken internally 05/15/2012  . Dermatitis due to drug reaction 05/15/2012  . Acute renal failure (HCC) 05/15/2012  . Diastolic dysfunction, left ventricle  05/14/2012  . Afib, paroxysmal, with RVR at times with BBB 05/13/2012  . Anemia 05/13/2012  . Cellulitis in diabetic foot (HCC) 05/12/2012  . DM (diabetes mellitus) (HCC) 05/12/2012  . HTN (hypertension) 05/12/2012  . Hypercholesterolemia 05/12/2012  . BP (high blood pressure) 05/12/2012  . Abscess or cellulitis of leg 04/18/2012  . Diabetes mellitus (HCC) 04/18/2012  . CN (constipation) 03/17/2012  . Abnormal gait 02/08/2012  . Benign essential HTN 02/08/2012  . Acid reflux 02/08/2012    Past Surgical History:  Procedure Laterality Date  . ABDOMINAL HYSTERECTOMY    . BLADDER SURGERY    . cataract surgery    . ENDOVENOUS ABLATION SAPHENOUS VEIN W/ LASER    . NASAL SINUS SURGERY    . TOE AMPUTATION    . TONSILLECTOMY      Allergies Ciprofloxacin; Sulfamethoxazole-trimethoprim; Augmentin [amoxicillin-pot clavulanate]; Levofloxacin; Losartan potassium-hctz; Nitrofurantoin; Sulfa antibiotics; Sulfasalazine; and Levaquin [levofloxacin in d5w]  Social History Social History   Tobacco Use  . Smoking status: Never Smoker  . Smokeless tobacco: Never Used  Substance Use Topics  . Alcohol use: No    Alcohol/week: 0.0 oz  . Drug use: No    Review of Systems Constitutional: Negative for fever. Cardiovascular: Negative for chest pain. Respiratory: Negative for shortness of breath. Gastrointestinal: Negative for abdominal pain, vomiting and diarrhea. Genitourinary: Positive for urinary retention Skin: Negative for rash. Neurological: Negative for headaches, focal weakness or numbness.  All systems negative/normal/unremarkable except as stated in the HPI  ____________________________________________   PHYSICAL EXAM:  VITAL SIGNS: ED Triage Vitals  Enc Vitals Group     BP 12/18/17 2149 (!) 174/82     Pulse Rate 12/18/17 2149 (!) 58     Resp 12/18/17 2149 16     Temp 12/18/17 2149 97.8 F (36.6 C)     Temp Source 12/18/17 2149 Oral     SpO2 12/18/17 2149 97 %      Weight 12/18/17 2150 142 lb (64.4 kg)     Height 12/18/17 2150 5\' 5"  (1.651 m)     Head Circumference --      Peak Flow --      Pain Score 12/18/17 2149 5     Pain Loc --      Pain Edu? --      Excl. in GC? --     Constitutional: Alert and oriented.  Chronically ill-appearing. No distress. Eyes: Conjunctivae are normal. Normal extraocular movements. ENT   Head: Normocephalic and atraumatic.   Nose: No congestion/rhinnorhea.   Mouth/Throat: Mucous membranes are dry.   Neck: No stridor. Cardiovascular: Normal rate, regular rhythm. No murmurs, rubs, or gallops. Respiratory: Normal respiratory effort without tachypnea nor retractions. Breath sounds are clear and equal bilaterally. No wheezes/rales/rhonchi. Gastrointestinal: Palpable bladder mass, tender.  Normal bowel sounds. Musculoskeletal: Nontender with normal range of motion in extremities. No lower extremity tenderness nor edema. Neurologic:  Normal speech and language. Skin:  Skin is warm, dry and intact. No rash noted. ___________________________________________  ED COURSE:  As part of my medical decision making, I reviewed the following data within the electronic MEDICAL RECORD NUMBER History obtained from family if available, nursing notes,  old chart and ekg, as well as notes from prior ED visits. Patient presented for possible urinary retention, we will assess with labs and imaging as indicated at this time.   Procedures ____________________________________________   LABS (pertinent positives/negatives)  Labs Reviewed  CBC WITH DIFFERENTIAL/PLATELET - Abnormal; Notable for the following components:      Result Value   RBC 3.46 (*)    Hemoglobin 11.1 (*)    HCT 34.0 (*)    RDW 14.9 (*)    Neutro Abs 8.7 (*)    Lymphs Abs 0.6 (*)    All other components within normal limits  COMPREHENSIVE METABOLIC PANEL - Abnormal; Notable for the following components:   Sodium 134 (*)    Potassium 5.6 (*)    CO2 19 (*)     Glucose, Bld 221 (*)    BUN 54 (*)    Creatinine, Ser 2.58 (*)    GFR calc non Af Amer 16 (*)    GFR calc Af Amer 19 (*)    All other components within normal limits  URINALYSIS, COMPLETE (UACMP) WITH MICROSCOPIC - Abnormal; Notable for the following components:   Color, Urine AMBER (*)    APPearance CLOUDY (*)    Hgb urine dipstick SMALL (*)    Leukocytes, UA MODERATE (*)    Bacteria, UA RARE (*)    Squamous Epithelial / LPF 0-5 (*)    All other components within normal limits  URINE CULTURE  ____________________________________________  DIFFERENTIAL DIAGNOSIS   Urinary retention, renal failure, UTI, bladder prolapse  FINAL ASSESSMENT AND PLAN  Acute urinary retention, cystitis   Plan: Patient had presented for acute urinary retention. Patient's labs are stable with her prior.  She has chronic kidney disease and UTI.  We placed a Foley catheter with around 1700 cc of urine output.  She currently is taking Keflex but with the urinary retention I am not surprised that her bladder is infected currently.  Previously she had grown out yeast.  She will continue the Keflex that she was on previously as well as Diflucan.  She will be referred back to urology for close outpatient follow-up.   Emily FilbertWilliams, Brondon Wann E, MD   Note: This note was generated in part or whole with voice recognition software. Voice recognition is usually quite accurate but there are transcription errors that can and very often do occur. I apologize for any typographical errors that were not detected and corrected.     Emily FilbertWilliams, Simcha Farrington E, MD 12/18/17 [id number]    Emily FilbertWilliams, Jahmiya Guidotti E, MD 12/18/17 [phone]

## 2017-12-18 NOTE — ED Triage Notes (Signed)
Pt arrived via ems for c/o urinary retention - pt had urinary cath removed 2 days ago and was able to void normally yesterday but today she has been unable to void at all - at this time pt has 999ml of urine scanned in bladder - Dr Mayford KnifeWilliams is aware - order received to place foley cath

## 2017-12-18 NOTE — ED Notes (Signed)
Son is coming from out of town to pick up patient

## 2017-12-19 NOTE — ED Notes (Signed)
Discharge home with foley in place. Son here to take her home

## 2017-12-20 ENCOUNTER — Ambulatory Visit (INDEPENDENT_AMBULATORY_CARE_PROVIDER_SITE_OTHER): Payer: Medicare Other | Admitting: Family Medicine

## 2017-12-20 ENCOUNTER — Encounter: Payer: Self-pay | Admitting: Family Medicine

## 2017-12-20 VITALS — BP 129/73 | HR 73 | Ht 65.0 in | Wt 140.0 lb

## 2017-12-20 DIAGNOSIS — R339 Retention of urine, unspecified: Secondary | ICD-10-CM | POA: Diagnosis not present

## 2017-12-20 LAB — URINE CULTURE: SPECIAL REQUESTS: NORMAL

## 2017-12-20 NOTE — Progress Notes (Signed)
Cath Change/ Replacement  Patient is present today for a catheter change due to urinary retention.   A 16 FR foley cath was replaced into the bladder no complications were noted Urine return was noted 400ml and urine was yellow in color. The balloon was filled with 10ml of sterile water. A nihtr bag was attached for drainage.  Patient was given proper instruction on catheter care.    Preformed by: Teressa Lowerarrie Giovan Pinsky, CMA

## 2018-01-18 ENCOUNTER — Ambulatory Visit (INDEPENDENT_AMBULATORY_CARE_PROVIDER_SITE_OTHER): Payer: Medicare Other | Admitting: Urology

## 2018-01-18 ENCOUNTER — Encounter: Payer: Self-pay | Admitting: Urology

## 2018-01-18 VITALS — BP 155/65 | HR 45 | Ht 65.0 in | Wt 142.5 lb

## 2018-01-18 DIAGNOSIS — R339 Retention of urine, unspecified: Secondary | ICD-10-CM | POA: Diagnosis not present

## 2018-01-18 DIAGNOSIS — Z87440 Personal history of urinary (tract) infections: Secondary | ICD-10-CM | POA: Diagnosis not present

## 2018-01-18 DIAGNOSIS — N8111 Cystocele, midline: Secondary | ICD-10-CM | POA: Diagnosis not present

## 2018-01-18 DIAGNOSIS — N952 Postmenopausal atrophic vaginitis: Secondary | ICD-10-CM | POA: Diagnosis not present

## 2018-01-18 NOTE — Progress Notes (Signed)
01/18/2018 10:20 AM   Pamela [name] 07/21/1934 [id number]  Referring provider: Mick SellFitzgerald, David P, MD [address] [place], [place] [id number]  Chief Complaint  Patient presents with  . Follow-up    HPI: Patient is a 84 year old Caucasian female with recurrent UTI's, mixed incontinence and vaginal atrophy who presents today after a failed TOV with her son, Pamela [name].    Urinary retention Patient presented to the ED on 11/28/2017 after not being able to void for 24 hours.  2.3 L were obtained after Foley was placed.  Urine culture was positive for yeast.  Her most recent serum creatinine was 2.46 on 11/30/2017.  Foley removed on 12/15/2017 for TOV.   She returned to the ED on 12/18/2017 after not urinating for 2 days and Foley was replaced.  1700 cc of urine was recovered.  Her creatinine was 2.58.   She is currently on tamsulosin.  She and her son are wanting another TOV.    Recurrent UTI Patient was undergoing gentamicin instillations three times weekly in our office.  She was seen on 12/23/2017 by Dr. Sampson GoonFitzgerald and placed on suppressive Macrobid therapy.     Bladder wall thickening A CT of the pelvis was performed on 04/14/2012 and it noted no hydronephrosis, but irregular thickened bladder wall with debris within the bladder and left renal cysts.  Patient underwent a cystoscopy on 09/27/2014 with Dr. Lonna CobbStoioff and no abnormalities were noted.  Renal ultrasound completed on 09/29/2016 noted that the urinary bladder wall is irregularly thickened with increased vascularity questioning significant inflammatory change versus possible neoplastic infiltration within the urinary bladder. And a left renal cyst which appears slightly larger than previous study.   Patient underwent cystoscopy on 03/16/2017 with Dr. Mena GoesEskridge and bladder was irrigated of debris and two small diverticulum where inspected.  No tumors were seen.    Incontinence She has not been seen since 02/2017 by gynecology for  pessary removal and cleansing.   Foley in place and failed two voiding trials.  Will most likely end up with long term Foley or SPT.     PMH: Past Medical History:  Diagnosis Date  . A-fib (HCC) 08/28/2014   Overview:  Overview:  Diastolic [facility]2012-[facility]   . Abnormal gait 02/08/2012  . Abscess or cellulitis of leg 04/18/2012  . Absence of bladder continence 07/29/2012  . Acid reflux 02/08/2012  . Acute kidney failure (HCC) 05/15/2012  . Afib, paroxysmal, with RVR at times with BBB 05/13/2012  . Allergic drug rash 05/15/2012  . Anemia 05/13/2012  . 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 Benign essential HTN 02/08/2012  . Bladder infection, chronic 06/29/2013  . BP (high blood pressure) 05/12/2012  . Bursitis of knee 08/07/2014  . Cellulitis in diabetic foot (HCC) 05/12/2012  . Chronic kidney disease 06/01/2012   Overview:  Overview:   12/2012- Renal US, simple L cyst. UIEP normal   . Clinical depression 08/28/2014  . CN (constipation) 03/17/2012  . Dermatitis due to drug reaction 05/15/2012  . Diabetes mellitus   . Diastolic dysfunction, left ventricle 05/14/2012  . DM (diabetes mellitus) (HCC) 05/12/2012  . Dysrhythmia   . Dysuria   . Edema leg 07/02/2014  . Enthesopathy of knee 08/07/2014  . Excess fluid volume 05/20/2012  . FOM (frequency of micturition) 07/29/2012  . Frank hematuria 09/27/2014  . Gangrene (HCC)   . GERD (gastroesophageal reflux disease)   . Heart disease   . High cholesterol   .  HTN (hypertension) 05/12/2012  . Hypercholesterolemia 05/12/2012  . Hypertension   . Incomplete bladder emptying 07/29/2012  . Open wound of knee, leg (except thigh), and ankle 06/01/2012  . 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   Overview:  Overview:  Several years ago, allergic reaction to sulfa antibiotic Several years ago, allergic reaction to sulfa antibiotic     Surgical History: Past Surgical History:  Procedure Laterality Date  . ABDOMINAL HYSTERECTOMY    . BLADDER  SURGERY    . cataract surgery    . ENDOVENOUS ABLATION SAPHENOUS VEIN W/ LASER    . NASAL SINUS SURGERY    . TOE AMPUTATION    . TONSILLECTOMY      Home Medications:  Allergies as of 01/18/2018      Reactions   Ciprofloxacin Anaphylaxis   Sulfamethoxazole-trimethoprim Other (See Comments), Rash   Went into [employer] [place].   Augmentin [amoxicillin-pot Clavulanate] Hives   Levofloxacin Other (See Comments)   Losartan Potassium-hctz Other (See Comments)   Other reaction(s): Unknown   Nitrofurantoin Other (See Comments)   Sulfa Antibiotics Other (See Comments)   Levonne Spiller Steven's Johnsons   Sulfasalazine    unknown   Levaquin [levofloxacin In D5w]       Medication List        Accurate as of 01/18/18 11:59 PM. Always use your most recent med list.          amiodarone 200 MG tablet Commonly known as:  PACERONE TAKE 1 TABLET (200 MG TOTAL) BY MOUTH ONCE DAILY.   amitriptyline 50 MG tablet Commonly known as:  ELAVIL TAKE 1 TABLET (50 MG TOTAL) BY MOUTH NIGHTLY.   atorvastatin 40 MG tablet Commonly known as:  LIPITOR Take 40 mg by mouth daily.   glucose blood test strip 1 each 2 (two) times daily. Reported on 12/18/2015   metoprolol tartrate 50 MG tablet Commonly known as:  LOPRESSOR Take 1 tablet (50 mg total) by mouth 2 (two) times daily.   nitrofurantoin 100 MG capsule Commonly known as:  MACRODANTIN Take 100 mg by mouth 4 (four) times daily.   NOVOLOG MIX 70/30 FLEXPEN (70-30) 100 UNIT/ML FlexPen Generic drug:  insulin aspart protamine - aspart Inject 0.12 mLs (12 Units total) into the skin 2 (two) times daily with a meal.   pantoprazole 40 MG tablet Commonly known as:  PROTONIX Take 40 mg by mouth daily.   PHARMACIST CHOICE LANCETS Misc USE 2 TIMES DAILY FOR DIABETIC TESTING   tamsulosin 0.4 MG Caps capsule Commonly known as:  FLOMAX Take 0.4 mg by mouth daily. 30 minutes before the same meal       Allergies:  Allergies  Allergen  Reactions  . Ciprofloxacin Anaphylaxis  . Sulfamethoxazole-Trimethoprim Other (See Comments) and Rash    Went into [employer].  . Augmentin [Amoxicillin-Pot Clavulanate] Hives  . Levofloxacin Other (See Comments)  . Losartan Potassium-Hctz Other (See Comments)    Other reaction(s): Unknown  . Nitrofurantoin Other (See Comments)  . Sulfa Antibiotics Other (See Comments)    Levonne Spiller Steven's Johnsons  . Sulfasalazine     unknown  . Levaquin [Levofloxacin In D5w]     Family History: Family History  Problem Relation Age of Onset  . Diabetes Mellitus II Mother   . Heart disease Mother   . Lung cancer Father   . Diabetes Paternal Grandmother   . Diabetes Maternal Grandmother   . Kidney disease Neg Hx   . Prostate cancer Neg Hx   .  Bladder Cancer Neg Hx     Social History:  reports that  has never smoked. she has never used smokeless tobacco. She reports that she does not drink alcohol or use drugs.  ROS: UROLOGY Frequent Urination?: No Hard to postpone urination?: No Burning/pain with urination?: No Get up at night to urinate?: No Leakage of urine?: No Urine stream starts and stops?: No Trouble starting stream?: No Do you have to strain to urinate?: No Blood in urine?: No Urinary tract infection?: No Sexually transmitted disease?: No Injury to kidneys or bladder?: No Painful intercourse?: No Weak stream?: No Currently pregnant?: No Vaginal bleeding?: No Last menstrual period?: n  Gastrointestinal Nausea?: No Vomiting?: No Indigestion/heartburn?: No Diarrhea?: No Constipation?: No  Constitutional Fever: No Night sweats?: No Weight loss?: No Fatigue?: No  Skin Skin rash/lesions?: No Itching?: No  Eyes Blurred vision?: No Double vision?: No  Ears/Nose/Throat Sore throat?: No Sinus problems?: No  Hematologic/Lymphatic Swollen glands?: No Easy bruising?: No  Cardiovascular Leg swelling?: No Chest pain?:  No  Respiratory Cough?: No Shortness of breath?: No  Endocrine Excessive thirst?: No  Musculoskeletal Back pain?: No Joint pain?: No  Neurological Headaches?: No Dizziness?: No  Psychologic Depression?: No Anxiety?: No  Physical Exam: BP (!) 155/65 (BP Location: Right Arm, Patient Position: Sitting, Cuff Size: Normal)   Pulse (!) 45   Ht 5\' 5"  (1.651 m)   Wt 142 lb 8 oz (64.6 kg)   BMI 23.71 kg/m   Constitutional: Well nourished. Alert and oriented, No acute distress. HEENT: [place] AT, moist mucus membranes. Trachea midline, no masses. Cardiovascular: No clubbing, cyanosis, or edema. Respiratory: Normal respiratory effort, no increased work of breathing. Skin: No rashes, bruises or suspicious lesions. Lymph: No cervical or inguinal adenopathy. Neurologic: Grossly intact, no focal deficits, moving all 4 extremities. Psychiatric: Normal mood and affect.  Laboratory Data See above  I have reviewed the labs.  Assessment & Plan:    1. Acute urinary retention:     - I expressed my concerns about the patient having the Foley removed as she has failed 2 voiding trials previously and she went longer than 24 hours prior to anyone discovering or her complaining that she hadn't urinated and this can lead to renal failure and sepsis - they still want to have another TOV in the am - I have suggested that we remove the Foley in the am and then have the patient return in the afternoon for a PVR - if the PVR is 300 or greater to place the Foley and have a discussion on whether or not to pursue a SPT or keep the indwelling Foley - both patient and son hesitantly agree  2. History of recurrent UTI's  - patient on Macrobid suppression   3. Vaginal atrophy  - continue Vagi fem ring in place - needs to schedule follow up with Dr. Tiburcio Pea  4. Cystocele  - needs to schedule follow up with Dr. Tiburcio Pea regarding pessary care                                      Return for RTC in the am for  TOV and then afternoon for PVR.  These notes generated with voice recognition software. I apologize for typographical errors.  Michiel Cowboy, PA-C  [facility] [address] [place], [place] [address] [phone]

## 2018-01-19 ENCOUNTER — Ambulatory Visit: Payer: Medicare Other | Admitting: Urology

## 2018-01-20 ENCOUNTER — Ambulatory Visit (INDEPENDENT_AMBULATORY_CARE_PROVIDER_SITE_OTHER): Payer: Medicare Other | Admitting: Urology

## 2018-01-20 VITALS — BP 129/72 | HR 69 | Ht 65.0 in | Wt 142.0 lb

## 2018-01-20 DIAGNOSIS — R339 Retention of urine, unspecified: Secondary | ICD-10-CM

## 2018-01-20 LAB — BLADDER SCAN AMB NON-IMAGING: Scan Result: 21

## 2018-01-20 NOTE — Progress Notes (Signed)
Catheter Removal  Patient is present today for a catheter removal.  10ml of water was drained from the balloon. A 16FR foley cath was removed from the bladder no complications were noted . Patient tolerated well.  Preformed by: Rupert Stackshelsea Reba Hulett, LPN   Follow up/ Additional notes: Pt will RTC this afternoon for bladder scan.   Blood pressure 129/72, pulse 69, height 5\' 5"  (1.651 m), weight 142 lb (64.4 kg).  Bladder Scan:21 Patient can void: Performed By: Rupert Stackshelsea Mendy Chou, LPN

## 2018-01-21 ENCOUNTER — Telehealth: Payer: Self-pay | Admitting: Urology

## 2018-01-21 NOTE — Telephone Encounter (Signed)
Mrs. Pamela [name] will need a follow up in two weeks for a PVR and creatinine.

## 2018-01-26 ENCOUNTER — Telehealth: Payer: Self-pay

## 2018-01-26 NOTE — Telephone Encounter (Signed)
Pt called stating she knows that she has another UTI and wanted some abx called into her pharmacy.  Reinforced with pt would need a urine specimen before abx could be given. Pt stated that she does not have a ride until Monday and her son demanded that she see a provider. [facility] Reinforced with pt if develops n/v, f/c, or pain increases to seek tx at the ER. Pt voiced understanding.

## 2018-01-28 NOTE — Telephone Encounter (Signed)
She has a nurse visit on Monday, can she have this done then?  Pamela [name]

## 2018-01-28 NOTE — Telephone Encounter (Signed)
That is fine 

## 2018-01-31 ENCOUNTER — Ambulatory Visit (INDEPENDENT_AMBULATORY_CARE_PROVIDER_SITE_OTHER): Payer: Medicare Other | Admitting: Urology

## 2018-01-31 ENCOUNTER — Other Ambulatory Visit: Payer: Self-pay | Admitting: Radiology

## 2018-01-31 ENCOUNTER — Encounter: Payer: Self-pay | Admitting: Urology

## 2018-01-31 VITALS — BP 169/71 | HR 56 | Resp 15 | Ht 65.0 in | Wt 144.0 lb

## 2018-01-31 DIAGNOSIS — N8111 Cystocele, midline: Secondary | ICD-10-CM

## 2018-01-31 DIAGNOSIS — N952 Postmenopausal atrophic vaginitis: Secondary | ICD-10-CM

## 2018-01-31 DIAGNOSIS — Z87440 Personal history of urinary (tract) infections: Secondary | ICD-10-CM

## 2018-01-31 DIAGNOSIS — R339 Retention of urine, unspecified: Secondary | ICD-10-CM

## 2018-01-31 LAB — BLADDER SCAN AMB NON-IMAGING

## 2018-01-31 NOTE — Progress Notes (Signed)
01/31/2018 9:17 AM   Pamela [name] [birth year] [id number]  Referring provider: Mick Sell, MD [address] [place], [place] [address]  Chief Complaint  Patient presents with  . Urinary Retention    HPI: Patient is a 84 year old Caucasian female with recurrent UTI's, mixed incontinence and vaginal atrophy who presents today for a one month follow up after a TOV with her son, Loraine Leriche.    Urinary retention Patient presented to the ED on 11/28/2017 after not being able to void for 24 hours.  2.3 L were obtained after Foley was placed.  Urine culture was positive for yeast.  Her most recent serum creatinine was 2.46 on 11/30/2017.  Foley removed on 12/15/2017 for TOV.   She returned to the ED on 12/18/2017 after not urinating for 2 days and Foley was replaced.  1700 cc of urine was recovered.  Her creatinine was 2.58.   She is currently on tamsulosin.  She and her son are wanting another TOV.  Foley was removed on 01/18/2018.   She has been reporting incontinence and frequency.   Patient denies any gross hematuria, dysuria or suprapubic/flank pain.  Patient denies any fevers, chills, nausea or vomiting.   Her PVR today is 892 mL.    Recurrent UTI Patient was undergoing gentamicin instillations three times weekly in our office.  She was seen on 12/23/2017 by Dr. Sampson Goon and placed on suppressive Macrobid therapy.     Bladder wall thickening A CT of the pelvis was performed on 04/14/2012 and it noted no hydronephrosis, but irregular thickened bladder wall with debris within the bladder and left renal cysts.  Patient underwent a cystoscopy on 09/27/2014 with Dr. Lonna Cobb and no abnormalities were noted.  Renal ultrasound completed on 09/29/2016 noted that the urinary bladder wall is irregularly thickened with increased vascularity questioning significant inflammatory change versus possible neoplastic infiltration within the urinary bladder. And a left renal cyst which appears  slightly larger than previous study.   Patient underwent cystoscopy on 03/16/2017 with Dr. Mena Goes and bladder was irrigated of debris and two small diverticulum where inspected.  No tumors were seen.    Incontinence She has not been seen since 02/2017 by gynecology for pessary removal and cleansing.   Foley in place and failed two voiding trials. Will most likely end up with long term Foley or SPT.   Patient has failed another TOV.  He and her son would like pursue SPT at this time.    PMH: Past Medical History:  Diagnosis Date  . A-fib (HCC) 08/28/2014   Overview:  Overview:  Diastolic dysfunction on ECHO 04/2012-done at [facility]   . Abnormal gait 02/08/2012  . Abscess or cellulitis of leg 04/18/2012  . Absence of bladder continence 07/29/2012  . Acid reflux 02/08/2012  . Acute kidney failure (HCC) 05/15/2012  . Afib, paroxysmal, with RVR at times with BBB 05/13/2012  . Allergic drug rash 05/15/2012  . Anemia 05/13/2012  . 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 Benign essential HTN 02/08/2012  . Bladder infection, chronic 06/29/2013  . BP (high blood pressure) 05/12/2012  . Bursitis of knee 08/07/2014  . Cellulitis in diabetic foot (HCC) 05/12/2012  . Chronic kidney disease 06/01/2012   Overview:  Overview:   12/2012- Renal US, simple L cyst. UIEP normal   . Clinical depression 08/28/2014  . CN (constipation) 03/17/2012  . Dermatitis due to drug reaction 05/15/2012  . Diabetes mellitus   . Diastolic dysfunction, left ventricle 05/14/2012  .  DM (diabetes mellitus) (HCC) 05/12/2012  . Dysrhythmia   . Dysuria   . Edema leg 07/02/2014  . Enthesopathy of knee 08/07/2014  . Excess fluid volume 05/20/2012  . FOM (frequency of micturition) 07/29/2012  . Frank hematuria 09/27/2014  . Gangrene (HCC)   . GERD (gastroesophageal reflux disease)   . Heart disease   . High cholesterol   . HTN (hypertension) 05/12/2012  . Hypercholesterolemia 05/12/2012  . Hypertension   . Incomplete bladder emptying 07/29/2012  . Open  wound of knee, leg (except thigh), and ankle 06/01/2012  . 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   Overview:  Overview:  Several years ago, allergic reaction to sulfa antibiotic Several years ago, allergic reaction to sulfa antibiotic     Surgical History: Past Surgical History:  Procedure Laterality Date  . ABDOMINAL HYSTERECTOMY    . BLADDER SURGERY    . cataract surgery    . ENDOVENOUS ABLATION SAPHENOUS VEIN W/ LASER    . NASAL SINUS SURGERY    . TOE AMPUTATION    . TONSILLECTOMY      Home Medications:  Allergies as of 01/31/2018      Reactions   Ciprofloxacin Anaphylaxis   Sulfamethoxazole-trimethoprim Other (See Comments), Rash   Went into [employer] [place].   Augmentin [amoxicillin-pot Clavulanate] Hives   Levofloxacin Other (See Comments)   Losartan Potassium-hctz Other (See Comments)   Other reaction(s): Unknown   Nitrofurantoin Other (See Comments)   Sulfa Antibiotics Other (See Comments)   Levonne Spiller Steven's Johnsons   Sulfasalazine    unknown   Levaquin [levofloxacin In D5w]       Medication List        Accurate as of 01/31/18 11:59 PM. Always use your most recent med list.          amiodarone 200 MG tablet Commonly known as:  PACERONE TAKE 1 TABLET (200 MG TOTAL) BY MOUTH ONCE DAILY.   amitriptyline 50 MG tablet Commonly known as:  ELAVIL TAKE 1 TABLET (50 MG TOTAL) BY MOUTH NIGHTLY.   aspirin EC 81 MG tablet Take 81 mg by mouth daily.   atorvastatin 40 MG tablet Commonly known as:  LIPITOR Take 40 mg by mouth daily.   glucose blood test strip 1 each 2 (two) times daily. Reported on 12/18/2015   metoprolol tartrate 50 MG tablet Commonly known as:  LOPRESSOR Take 1 tablet (50 mg total) by mouth 2 (two) times daily.   nitrofurantoin 100 MG capsule Commonly known as:  MACRODANTIN Take 100 mg by mouth 4 (four) times daily.   NOVOLOG MIX 70/30 FLEXPEN (70-30) 100 UNIT/ML FlexPen Generic drug:  insulin aspart protamine -  aspart Inject 0.12 mLs (12 Units total) into the skin 2 (two) times daily with a meal.   pantoprazole 40 MG tablet Commonly known as:  PROTONIX Take 40 mg by mouth daily.   PHARMACIST CHOICE LANCETS Misc USE 2 TIMES DAILY FOR DIABETIC TESTING   tamsulosin 0.4 MG Caps capsule Commonly known as:  FLOMAX Take 0.4 mg by mouth daily. 30 minutes before the same meal       Allergies:  Allergies  Allergen Reactions  . Ciprofloxacin Anaphylaxis  . Sulfamethoxazole-Trimethoprim Other (See Comments) and Rash    Went into [employer].  . Augmentin [Amoxicillin-Pot Clavulanate] Hives  . Levofloxacin Other (See Comments)  . Losartan Potassium-Hctz Other (See Comments)    Other reaction(s): Unknown  . Nitrofurantoin Other (See Comments)  . Sulfa Antibiotics Other (See Comments)  Levonne Spiller Steven's Johnsons  . Sulfasalazine     unknown  . Levaquin [Levofloxacin In D5w]     Family History: Family History  Problem Relation Age of Onset  . Diabetes Mellitus II Mother   . Heart disease Mother   . Lung cancer Father   . Diabetes Paternal Grandmother   . Diabetes Maternal Grandmother   . Kidney disease Neg Hx   . Prostate cancer Neg Hx   . Bladder Cancer Neg Hx     Social History:  reports that  has never smoked. she has never used smokeless tobacco. She reports that she does not drink alcohol or use drugs.  ROS: UROLOGY Frequent Urination?: Yes Hard to postpone urination?: Yes Burning/pain with urination?: Yes Get up at night to urinate?: Yes Leakage of urine?: Yes Urine stream starts and stops?: Yes Trouble starting stream?: Yes Do you have to strain to urinate?: No Blood in urine?: No Urinary tract infection?: Yes Sexually transmitted disease?: No Injury to kidneys or bladder?: No Painful intercourse?: No Weak stream?: No Currently pregnant?: No Vaginal bleeding?: No Last menstrual period?: n  Gastrointestinal Nausea?: No Vomiting?:  No Indigestion/heartburn?: No Diarrhea?: No Constipation?: No  Constitutional Fever: No Night sweats?: No Weight loss?: No Fatigue?: Yes  Skin Skin rash/lesions?: No Itching?: No  Eyes Blurred vision?: No Double vision?: No  Ears/Nose/Throat Sore throat?: No Sinus problems?: No  Hematologic/Lymphatic Swollen glands?: No Easy bruising?: No  Cardiovascular Leg swelling?: No Chest pain?: No  Respiratory Cough?: No Shortness of breath?: No  Endocrine Excessive thirst?: No  Musculoskeletal Back pain?: No Joint pain?: No  Neurological Headaches?: No Dizziness?: No  Psychologic Depression?: No Anxiety?: No  Physical Exam: BP (!) 169/71   Pulse (!) 56   Resp 15   Ht 5\' 5"  (1.651 m)   Wt 144 lb (65.3 kg)   SpO2 94%   BMI 23.96 kg/m   Constitutional: Well nourished. Alert and oriented, No acute distress. HEENT: [place] AT, moist mucus membranes. Trachea midline, no masses. Cardiovascular: No clubbing, cyanosis, or edema. Respiratory: Normal respiratory effort, no increased work of breathing. GI: Abdomen is soft, non tender, non distended, no abdominal masses. Liver and spleen not palpable.  No hernias appreciated.  Stool sample for occult testing is not indicated.   GU: No CVA tenderness.  No bladder fullness or masses.  Normal external genitalia, normal pubic hair distribution, no lesions.  Normal urethral meatus, no lesions, no prolapse, no discharge.   No urethral masses, tenderness and/or tenderness. No bladder fullness, tenderness or masses. Normal vagina mucosa, good estrogen effect, E-string and pessary in place.  I removed both.   Cervix and uterus are surgically absent.  No adnexal/parametria masses or tenderness noted.  Anus and perineum are without rashes or lesions.    Skin: No rashes, bruises or suspicious lesions. Lymph: No cervical or inguinal adenopathy. Neurologic: Grossly intact, no focal deficits, moving all 4 extremities. Psychiatric: Normal  mood and affect.   Laboratory Data See above  I have reviewed the labs.  Assessment & Plan:    1. Urinary retention:     - will pursue a SPT at this time as patient has failed multiple TOV  - explained that the SPT will be placed under CT guidance with local anesthetic with serial dilations  - schedule SPT placement  2. History of recurrent UTI's  - patient on Macrobid suppression   3. Vaginal atrophy  - estrogen ring removed  4. Cystocele  - pessary removed  Return for SPT exchange .  These notes generated with voice recognition software. I apologize for typographical errors.  Michiel CowboySHANNON Kaarin Pardy, PA-C  [facility] [address], [address] [place], [place] [address] [phone]

## 2018-01-31 NOTE — Progress Notes (Unsigned)
guided

## 2018-02-01 ENCOUNTER — Telehealth: Payer: Self-pay | Admitting: Radiology

## 2018-02-01 ENCOUNTER — Other Ambulatory Visit: Payer: Self-pay | Admitting: Radiology

## 2018-02-01 DIAGNOSIS — R339 Retention of urine, unspecified: Secondary | ICD-10-CM

## 2018-02-01 LAB — CREATININE, SERUM
CREATININE: 1.48 mg/dL — AB (ref 0.57–1.00)
GFR calc Af Amer: 38 mL/min/{1.73_m2} — ABNORMAL LOW (ref >59)
GFR calc non Af Amer: 33 mL/min/{1.73_m2} — ABNORMAL LOW (ref >59)

## 2018-02-01 NOTE — Telephone Encounter (Signed)
Notified son of SPT placement [facility]. Instructions given & questions answered. No further questions at this time. Son voices understanding.

## 2018-02-08 ENCOUNTER — Other Ambulatory Visit: Payer: Self-pay | Admitting: Student

## 2018-02-08 MED ORDER — VANCOMYCIN HCL 10 G IV SOLR: 1000.0000 mg | INTRAVENOUS | Status: AC

## 2018-02-09 ENCOUNTER — Ambulatory Visit
Admission: RE | Admit: 2018-02-09 | Discharge: 2018-02-09 | Disposition: A | Discharge status: Home / Self Care | Payer: Medicare Other | Source: Ambulatory Visit | Attending: Urology | Admitting: Urology

## 2018-02-09 DIAGNOSIS — R339 Retention of urine, unspecified: Secondary | ICD-10-CM | POA: Insufficient documentation

## 2018-02-09 LAB — PROTIME-INR
INR: 1.02
Prothrombin Time: 13.3 seconds (ref 11.4–15.2)

## 2018-02-09 LAB — CBC
HCT: 36.6 % (ref 35.0–47.0)
Hemoglobin: 12 g/dL (ref 12.0–16.0)
MCH: 31.8 pg (ref 26.0–34.0)
MCHC: 32.8 g/dL (ref 32.0–36.0)
MCV: 97 fL (ref 80.0–100.0)
PLATELETS: 193 10*3/uL (ref 150–440)
RBC: 3.77 MIL/uL — ABNORMAL LOW (ref 3.80–5.20)
RDW: 15.6 % — AB (ref 11.5–14.5)
WBC: 5.8 10*3/uL (ref 3.6–11.0)

## 2018-02-09 LAB — APTT: aPTT: 27 seconds (ref 24–36)

## 2018-02-09 LAB — GLUCOSE, CAPILLARY: Glucose-Capillary: 148 mg/dL — ABNORMAL HIGH (ref 65–99)

## 2018-02-09 MED ORDER — ONDANSETRON HCL 4 MG/2ML IJ SOLN
INTRAMUSCULAR | Status: AC
Start: 2018-02-09 — End: 2018-02-09
  Administered 2018-02-09: 4 mg via INTRAVENOUS
  Filled 2018-02-09: qty 2

## 2018-02-09 MED ORDER — MIDAZOLAM HCL 5 MG/5ML IJ SOLN
INTRAMUSCULAR | Status: AC | PRN
  Administered 2018-02-09 (×2): 1 mg via INTRAVENOUS

## 2018-02-09 MED ORDER — FENTANYL CITRATE (PF) 100 MCG/2ML IJ SOLN
INTRAMUSCULAR | Status: AC
  Filled 2018-02-09: qty 4

## 2018-02-09 MED ORDER — MIDAZOLAM HCL 5 MG/5ML IJ SOLN
INTRAMUSCULAR | Status: AC
  Filled 2018-02-09: qty 5

## 2018-02-09 MED ORDER — HYDROCODONE-ACETAMINOPHEN 5-325 MG PO TABS
1.0000 | ORAL_TABLET | ORAL | Status: DC | PRN
  Filled 2018-02-09: qty 2

## 2018-02-09 MED ORDER — SODIUM CHLORIDE 0.9 % IV SOLN: INTRAVENOUS | Status: DC

## 2018-02-09 MED ORDER — ONDANSETRON HCL 4 MG/2ML IJ SOLN
4.0000 mg | Freq: Once | INTRAMUSCULAR | Status: AC
  Administered 2018-02-09: 4 mg via INTRAVENOUS
  Filled 2018-02-09: qty 2

## 2018-02-09 MED ORDER — FENTANYL CITRATE (PF) 100 MCG/2ML IJ SOLN
INTRAMUSCULAR | Status: AC | PRN
  Administered 2018-02-09: 25 ug via INTRAVENOUS
  Administered 2018-02-09: 50 ug via INTRAVENOUS

## 2018-02-09 MED ORDER — LIDOCAINE HCL (PF) 1 % IJ SOLN
INTRAMUSCULAR | Status: AC | PRN
  Administered 2018-02-09: 10 mL

## 2018-02-09 NOTE — Sedation Documentation (Signed)
220

## 2018-02-09 NOTE — Procedures (Signed)
  Procedure: CT suprapubic catheter 3398f EBL:   minimal Complications:  none immediate  See full dictation in [facility] PACS.  Thora Lance. Sohan Potvin MD Main # [phone] Pager  [phone]

## 2018-02-09 NOTE — Sedation Documentation (Signed)
220 ml sterile water injected via Irrigation syringe into bladder via exsisting bladder for bladder distention.

## 2018-02-10 ENCOUNTER — Other Ambulatory Visit: Payer: Self-pay | Admitting: Radiology

## 2018-02-10 DIAGNOSIS — R339 Retention of urine, unspecified: Secondary | ICD-10-CM

## 2018-02-12 ENCOUNTER — Emergency Department
Admission: EM | Admit: 2018-02-12 | Discharge: 2018-02-13 | Disposition: A | Discharge status: Home / Self Care | Payer: Medicare Other | Attending: Emergency Medicine | Admitting: Emergency Medicine

## 2018-02-12 DIAGNOSIS — I129 Hypertensive chronic kidney disease with stage 1 through stage 4 chronic kidney disease, or unspecified chronic kidney disease: Secondary | ICD-10-CM | POA: Diagnosis not present

## 2018-02-12 DIAGNOSIS — T8189XA Other complications of procedures, not elsewhere classified, initial encounter: Secondary | ICD-10-CM | POA: Diagnosis not present

## 2018-02-12 DIAGNOSIS — Z79899 Other long term (current) drug therapy: Secondary | ICD-10-CM | POA: Insufficient documentation

## 2018-02-12 DIAGNOSIS — Y738 Miscellaneous gastroenterology and urology devices associated with adverse incidents, not elsewhere classified: Secondary | ICD-10-CM | POA: Insufficient documentation

## 2018-02-12 DIAGNOSIS — Z960 Presence of urogenital implants: Secondary | ICD-10-CM | POA: Diagnosis not present

## 2018-02-12 DIAGNOSIS — Z794 Long term (current) use of insulin: Secondary | ICD-10-CM | POA: Diagnosis not present

## 2018-02-12 DIAGNOSIS — Z7982 Long term (current) use of aspirin: Secondary | ICD-10-CM | POA: Insufficient documentation

## 2018-02-12 DIAGNOSIS — N189 Chronic kidney disease, unspecified: Secondary | ICD-10-CM | POA: Insufficient documentation

## 2018-02-12 DIAGNOSIS — E1122 Type 2 diabetes mellitus with diabetic chronic kidney disease: Secondary | ICD-10-CM | POA: Insufficient documentation

## 2018-02-12 NOTE — ED Provider Notes (Signed)
[facility] Emergency Department Provider Note ____________________________________________   None    (approximate)  I have reviewed the triage vital signs and the nursing notes.   HISTORY  Chief Complaint wound bleeding    HPI Pamela [name] is a 84 y.o. female with PMH as noted below and status post suprapubic catheter placement 3 days ago who presents with bleeding and drainage from around the suprapubic catheter, acute onset today, associated with some cloudy and dark urine, but not associated with any pain, fever, vomiting, diarrhea, or weakness.     Past Medical History:  Diagnosis Date  . A-fib (HCC) 08/28/2014   Overview:  Overview:  Diastolic dysfunction on ECHO 04/2012-done at [facility]   . Abnormal gait 02/08/2012  . Abscess or cellulitis of leg 04/18/2012  . Absence of bladder continence 07/29/2012  . Acid reflux 02/08/2012  . Acute kidney failure (HCC) 05/15/2012  . Afib, paroxysmal, with RVR at times with BBB 05/13/2012  . Allergic drug rash 05/15/2012  . Anemia 05/13/2012  . 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 Benign essential HTN 02/08/2012  . Bladder infection, chronic 06/29/2013  . BP (high blood pressure) 05/12/2012  . Bursitis of knee 08/07/2014  . Cellulitis in diabetic foot (HCC) 05/12/2012  . Chronic kidney disease 06/01/2012   Overview:  Overview:   12/2012- Renal US, simple L cyst. UIEP normal   . Clinical depression 08/28/2014  . CN (constipation) 03/17/2012  . Dermatitis due to drug reaction 05/15/2012  . Diabetes mellitus   . Diastolic dysfunction, left ventricle 05/14/2012  . DM (diabetes mellitus) (HCC) 05/12/2012  . Dysrhythmia   . Dysuria   . Edema leg 07/02/2014  . Enthesopathy of knee 08/07/2014  . Excess fluid volume 05/20/2012  . FOM (frequency of micturition) 07/29/2012  . Frank hematuria 09/27/2014  . Gangrene (HCC)   . GERD (gastroesophageal reflux disease)   . Heart disease   . High cholesterol   . HTN (hypertension)  05/12/2012  . Hypercholesterolemia 05/12/2012  . Hypertension   . Incomplete bladder emptying 07/29/2012  . Open wound of knee, leg (except thigh), and ankle 06/01/2012  . 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   Overview:  Overview:  Several years ago, allergic reaction to sulfa antibiotic Several years ago, allergic reaction to sulfa antibiotic     Patient Active Problem List   Diagnosis Date Noted  . UTI (urinary tract infection) 11/28/2017  . Pelvic muscle wasting 01/26/2017  . Cystocele, midline 01/26/2017  . Urinary frequency 12/26/2015  . Incontinence 12/07/2015  . Cystocele 12/07/2015  . Tinea cruris 12/07/2015  . Recurrent UTI 08/11/2015  . Vaginal atrophy 08/11/2015  . Fall 08/11/2015  . 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 Discoordination 05/08/2015  . Frank hematuria 09/27/2014  . A-fib (HCC) 08/28/2014  . Clinical depression 08/28/2014  . Atrial fibrillation (HCC) 08/28/2014  . Major depressive disorder with single episode 08/28/2014  . Bursitis of knee 08/07/2014  . Enthesopathy of knee 08/07/2014  . Edema leg 07/02/2014  . Bladder infection, chronic 06/29/2013  . Incomplete bladder emptying 07/29/2012  . FOM (frequency of micturition) 07/29/2012  . Absence of bladder continence 07/29/2012  . Chronic kidney disease 06/01/2012  . Open wound of knee, leg (except thigh), and ankle 06/01/2012  . Skin rash 05/20/2012  . Fluid overload 05/20/2012  . Excess fluid volume 05/20/2012  . Acute kidney failure (HCC) 05/15/2012  . Allergic drug rash 05/15/2012  . 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  . Dermatitis due to drug taken internally  05/15/2012  . Dermatitis due to drug reaction 05/15/2012  . Acute renal failure (HCC) 05/15/2012  . Diastolic dysfunction, left ventricle 05/14/2012  . Afib, paroxysmal, with RVR at times with BBB 05/13/2012  . Anemia 05/13/2012  . Cellulitis in diabetic foot (HCC) 05/12/2012  . DM (diabetes mellitus) (HCC) 05/12/2012  . HTN  (hypertension) 05/12/2012  . Hypercholesterolemia 05/12/2012  . BP (high blood pressure) 05/12/2012  . Abscess or cellulitis of leg 04/18/2012  . Diabetes mellitus (HCC) 04/18/2012  . CN (constipation) 03/17/2012  . Abnormal gait 02/08/2012  . Benign essential HTN 02/08/2012  . Acid reflux 02/08/2012    Past Surgical History:  Procedure Laterality Date  . ABDOMINAL HYSTERECTOMY    . BLADDER SURGERY    . cataract surgery    . ENDOVENOUS ABLATION SAPHENOUS VEIN W/ LASER    . NASAL SINUS SURGERY    . TOE AMPUTATION    . TONSILLECTOMY      Prior to Admission medications   Medication Sig Start Date End Date Taking? Authorizing Provider  amiodarone (PACERONE) 200 MG tablet TAKE 1 TABLET (200 MG TOTAL) BY MOUTH ONCE DAILY. 12/16/15   [provider]  amitriptyline (ELAVIL) 50 MG tablet TAKE 1 TABLET (50 MG TOTAL) BY MOUTH NIGHTLY. 03/31/16   [provider]  aspirin EC 81 MG tablet Take 81 mg by mouth daily.    [provider]  atorvastatin (LIPITOR) 40 MG tablet Take 40 mg by mouth daily.    [provider]  glucose blood test strip 1 each 2 (two) times daily. Reported on 12/18/2015 05/22/15   [provider]  metoprolol (LOPRESSOR) 50 MG tablet Take 1 tablet (50 mg total) by mouth 2 (two) times daily. 05/23/12   Osvaldo ShipperKrishnan, Gokul, MD  nitrofurantoin (MACRODANTIN) 100 MG capsule Take 100 mg by mouth 4 (four) times daily.    [provider]  NOVOLOG MIX 70/30 FLEXPEN (70-30) 100 UNIT/ML FlexPen Inject 0.12 mLs (12 Units total) into the skin 2 (two) times daily with a meal. 11/30/17   Mody, Sital, MD  pantoprazole (PROTONIX) 40 MG tablet Take 40 mg by mouth daily.    [provider]  PHARMACIST CHOICE LANCETS MISC USE 2 TIMES DAILY FOR DIABETIC TESTING 05/22/15   [provider]  tamsulosin (FLOMAX) 0.4 MG CAPS capsule Take 0.4 mg by mouth daily. 30 minutes before the same meal 03/19/16   [provider]     Allergies Ciprofloxacin; Sulfamethoxazole-trimethoprim; Augmentin [amoxicillin-pot clavulanate]; Levofloxacin; Losartan potassium-hctz; Nitrofurantoin; Sulfa antibiotics; Sulfasalazine; and Levaquin [levofloxacin in d5w]  Family History  Problem Relation Age of Onset  . Diabetes Mellitus II Mother   . Heart disease Mother   . Lung cancer Father   . Diabetes Paternal Grandmother   . Diabetes Maternal Grandmother   . Kidney disease Neg Hx   . Prostate cancer Neg Hx   . Bladder Cancer Neg Hx     Social History Social History   Tobacco Use  . Smoking status: Never Smoker  . Smokeless tobacco: Never Used  Substance Use Topics  . Alcohol use: No    Alcohol/week: 0.0 oz  . Drug use: No    Review of Systems  Constitutional: No fever/chills.  Cardiovascular: Denies chest pain. Respiratory: Denies shortness of breath. Gastrointestinal: No nausea, no vomiting.    Genitourinary: Negative for dysuria.  Musculoskeletal: Negative for back pain. Skin: Negative for rash. Neurological: Negative for headache.   ____________________________________________   PHYSICAL EXAM:  VITAL SIGNS: ED Triage Vitals [  02/12/18 2306]  Enc Vitals Group     BP (!) 168/79     Pulse Rate 61     Resp 16     Temp 98.4 F (36.9 C)     Temp Source Oral     SpO2 100 %     Weight      Height      Head Circumference      Peak Flow      Pain Score      Pain Loc      Pain Edu?      Excl. in GC?     Constitutional: Alert and oriented. Well appearing and in no acute distress. Eyes: Conjunctivae are normal.  Head: Atraumatic. Nose: No congestion/rhinnorhea. Mouth/Throat: Mucous membranes are moist.   Neck: Normal range of motion.  Cardiovascular: Good peripheral circulation. Respiratory: Normal respiratory effort.   Gastrointestinal: Soft and nontender. No distention.  Suprapubic catheter site with small amount of serosanguineous drainage but no fluid expressed with pressure.  No  induration or erythema. Genitourinary: No CVA tenderness. Musculoskeletal: Extremities warm and well perfused.  Neurologic:  Normal speech and language. No gross focal neurologic deficits are appreciated.  Skin:  Skin is warm and dry. No rash noted. Psychiatric: Mood and affect are normal. Speech and behavior are normal.  ____________________________________________   LABS (all labs ordered are listed, but only abnormal results are displayed)  Labs Reviewed  CBC WITH DIFFERENTIAL/PLATELET - Abnormal; Notable for the following components:      Result Value   RBC 3.26 (*)    Hemoglobin 10.4 (*)    HCT 32.0 (*)    RDW 15.1 (*)    Platelets 145 (*)    All other components within normal limits  URINALYSIS, COMPLETE (UACMP) WITH MICROSCOPIC - Abnormal; Notable for the following components:   Color, Urine AMBER (*)    APPearance CLOUDY (*)    Hgb urine dipstick MODERATE (*)    Protein, ur 100 (*)    Leukocytes, UA LARGE (*)    Bacteria, UA RARE (*)    Squamous Epithelial / LPF 0-5 (*)    All other components within normal limits   ____________________________________________  EKG   ____________________________________________  RADIOLOGY    ____________________________________________   PROCEDURES  Procedure(s) performed: No  Procedures  Critical Care performed: No ____________________________________________   INITIAL IMPRESSION / ASSESSMENT AND PLAN / ED COURSE  Pertinent labs & imaging results that were available during my care of the patient were reviewed by me and considered in my medical decision making (see chart for details).  84 year old female with past medical history as noted below and status post suprapubic catheter placement several days ago presents with bleeding and small amount of drainage from the catheter site over the last day, with no pain, fever, or other associated symptoms.  On exam, the patient is well-appearing, vital signs are normal,  there is a small amount of drainage as described but there is no significant tenderness, erythema or induration, or any further fluid expressed with pressure.  Overall presentation is most consistent with normal drainage after procedure, versus possibly small seroma.  There is no clinical evidence for active hemorrhage, hematoma, or abscess.  I will perform a bedside ultrasound, and obtain basic labs.  If labs show high white blood cell count, low Hgb, or other concerning acute findings consider additional imaging.  If labs are reassuring, likely discharge home with follow-up.    ----------------------------------------- 12:37 AM on 02/13/2018 -----------------------------------------  Bedside  ultrasound performed by me reveals no evidence of subcutaneous fluid collection, no evidence of cellulitis or tissue edema, or other acute complication.  The lab workup is unremarkable.  Patient's hemoglobin has dropped slightly in the last 4 days but this is not unusual for being after a procedure, but her white blood cell count is not elevated.  The UA shows signs of blood which I suspect is most likely due to the catheter irritating the lining of the bladder, but there are no nitrites.  Overall, given no fever, no pain, no obvious purulence, no elevated WBC count, or nitrates on urine, there is no clinical evidence of active infection or other concerning postsurgical complication.  I discussed the possibility of giving empiric antibiotics with the patient, however based on shared decision making the patient would prefer to hold off, observe the symptoms, and follow-up with her doctor.  I gave extensive return precautions, and the patient and her caregiver expressed understanding.   ____________________________________________   FINAL CLINICAL IMPRESSION(S) / ED DIAGNOSES  Final diagnoses:  Draining postoperative wound, initial encounter      NEW MEDICATIONS STARTED DURING THIS VISIT:  New  Prescriptions   No medications on file     Note:  This document was prepared using Dragon voice recognition software and may include unintentional dictation errors.    Dionne Bucy, MD 02/13/18 [phone]

## 2018-02-12 NOTE — ED Triage Notes (Signed)
Pt states had a suprapubic catheter placed on Wednesday. Pt now has small amount of bleeding to insertion site. Pt's caregiver sent pt "to get it checked" per ems.

## 2018-02-13 LAB — URINALYSIS, COMPLETE (UACMP) WITH MICROSCOPIC
BILIRUBIN URINE: NEGATIVE
Glucose, UA: NEGATIVE mg/dL
KETONES UR: NEGATIVE mg/dL
Nitrite: NEGATIVE
PROTEIN: 100 mg/dL — AB
Specific Gravity, Urine: 1.015 (ref 1.005–1.030)
pH: 8 (ref 5.0–8.0)

## 2018-02-13 LAB — CBC WITH DIFFERENTIAL/PLATELET
Basophils Absolute: 0 10*3/uL (ref 0–0.1)
Basophils Relative: 1 %
EOS PCT: 2 %
Eosinophils Absolute: 0.1 10*3/uL (ref 0–0.7)
HCT: 32 % — ABNORMAL LOW (ref 35.0–47.0)
HEMOGLOBIN: 10.4 g/dL — AB (ref 12.0–16.0)
LYMPHS ABS: 1.2 10*3/uL (ref 1.0–3.6)
LYMPHS PCT: 22 %
MCH: 32 pg (ref 26.0–34.0)
MCHC: 32.6 g/dL (ref 32.0–36.0)
MCV: 98.2 fL (ref 80.0–100.0)
MONOS PCT: 11 %
Monocytes Absolute: 0.6 10*3/uL (ref 0.2–0.9)
Neutro Abs: 3.6 10*3/uL (ref 1.4–6.5)
Neutrophils Relative %: 64 %
PLATELETS: 145 10*3/uL — AB (ref 150–440)
RBC: 3.26 MIL/uL — ABNORMAL LOW (ref 3.80–5.20)
RDW: 15.1 % — ABNORMAL HIGH (ref 11.5–14.5)
WBC: 5.6 10*3/uL (ref 3.6–11.0)

## 2018-02-13 NOTE — Discharge Instructions (Signed)
Keep an eye on the wound and on your urine, and return to the emergency department for new, worsening, or persistent bleeding, drainage of yellow fluid, redness or a rash around the wound, fever or chills, abdominal pain, or any other new or worsening symptoms that concern you.  Call your doctor on Monday to discuss follow-up.  Continue to take the antibiotic that you are on chronically.

## 2018-02-14 NOTE — Progress Notes (Signed)
02/15/2018 2:41 PM   Pamela [name] Dec 10, 1933 [id number]  Referring provider: Mick Sell, MD [address] [place], [place] [address]  No chief complaint on file.   HPI: Patient is a 84 year old Caucasian female with recurrent UTI's, mixed incontinence and vaginal atrophy who presents today for a follow up after issues with SPT site.  She underwent a CT guided SPT placement on 02/09/2018.   She was seen in the ED on 02/12/2018 with bleeding and drainage from around the suprapubic catheter.  Bedside ultrasound performed by the ED physician revealed no evidence of subcutaneous fluid.  UA was positive for TNTC RBC's and TNTC WBC's.   WBC count was 5.6.  She was reassured and discharged home.    Today, she is having some dark urine in the catheter bag.  She is still experiencing redness around the SPT site.  Patient denies any gross hematuria, dysuria or suprapubic/flank pain.  Patient denies any fevers, chills, nausea or vomiting.   She states she is still having some leakage from the urethra.   Urinary retention Managed with SPT.    Recurrent UTI Patient was undergoing gentamicin instillations three times weekly in our office.  She was seen on 12/23/2017 by Dr. Sampson Goon and placed on suppressive Macrobid therapy.     Bladder wall thickening A CT of the pelvis was performed on 04/14/2012 and it noted no hydronephrosis, but irregular thickened bladder wall with debris within the bladder and left renal cysts.  Patient underwent a cystoscopy on 09/27/2014 with Dr. Lonna Cobb and no abnormalities were noted.  Renal ultrasound completed on 09/29/2016 noted that the urinary bladder wall is irregularly thickened with increased vascularity questioning significant inflammatory change versus possible neoplastic infiltration within the urinary bladder. And a left renal cyst which appears slightly larger than previous study.   Patient underwent cystoscopy on 03/16/2017 with Dr. Mena Goes  and bladder was irrigated of debris and two small diverticulum where inspected.  No tumors were seen.    Incontinence Managed with SPT.    PMH: Past Medical History:  Diagnosis Date  . A-fib (HCC) 08/28/2014   Overview:  Overview:  Diastolic dysfunction on ECHO 04/2012-done at [facility]   . Abnormal gait 02/08/2012  . Abscess or cellulitis of leg 04/18/2012  . Absence of bladder continence 07/29/2012  . Acid reflux 02/08/2012  . Acute kidney failure (HCC) 05/15/2012  . Afib, paroxysmal, with RVR at times with BBB 05/13/2012  . Allergic drug rash 05/15/2012  . Anemia 05/13/2012  . 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 Benign essential HTN 02/08/2012  . Bladder infection, chronic 06/29/2013  . BP (high blood pressure) 05/12/2012  . Bursitis of knee 08/07/2014  . Cellulitis in diabetic foot (HCC) 05/12/2012  . Chronic kidney disease 06/01/2012   Overview:  Overview:   12/2012- Renal US, simple L cyst. UIEP normal   . Clinical depression 08/28/2014  . CN (constipation) 03/17/2012  . Dermatitis due to drug reaction 05/15/2012  . Diabetes mellitus   . Diastolic dysfunction, left ventricle 05/14/2012  . DM (diabetes mellitus) (HCC) 05/12/2012  . Dysrhythmia   . Dysuria   . Edema leg 07/02/2014  . Enthesopathy of knee 08/07/2014  . Excess fluid volume 05/20/2012  . FOM (frequency of micturition) 07/29/2012  . Frank hematuria 09/27/2014  . Gangrene (HCC)   . GERD (gastroesophageal reflux disease)   . Heart disease   . High cholesterol   . HTN (hypertension) 05/12/2012  . Hypercholesterolemia 05/12/2012  .  Hypertension   . Incomplete bladder emptying 07/29/2012  . Open wound of knee, leg (except thigh), and ankle 06/01/2012  . 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   Overview:  Overview:  Several years ago, allergic reaction to sulfa antibiotic Several years ago, allergic reaction to sulfa antibiotic     Surgical History: Past Surgical History:  Procedure Laterality Date  . ABDOMINAL HYSTERECTOMY    .  BLADDER SURGERY    . cataract surgery    . ENDOVENOUS ABLATION SAPHENOUS VEIN W/ LASER    . NASAL SINUS SURGERY    . TOE AMPUTATION    . TONSILLECTOMY      Home Medications:  Allergies as of 02/15/2018      Reactions   Ciprofloxacin Anaphylaxis   Sulfamethoxazole-trimethoprim Other (See Comments), Rash   Went into [employer] Johnson Sydrome.   Augmentin [amoxicillin-pot Clavulanate] Hives   Levofloxacin Other (See Comments)   Losartan Potassium-hctz Other (See Comments)   Other reaction(s): Unknown   Nitrofurantoin Other (See Comments)   Sulfa Antibiotics Other (See Comments)   Levonne SpillerStevens johnson Steven's Johnsons   Sulfasalazine    unknown   Levaquin [levofloxacin In D5w]       Medication List        Accurate as of 02/15/18  2:41 PM. Always use your most recent med list.          amiodarone 200 MG tablet Commonly known as:  PACERONE TAKE 1 TABLET (200 MG TOTAL) BY MOUTH ONCE DAILY.   amitriptyline 50 MG tablet Commonly known as:  ELAVIL TAKE 1 TABLET (50 MG TOTAL) BY MOUTH NIGHTLY.   aspirin EC 81 MG tablet Take 81 mg by mouth daily.   atorvastatin 40 MG tablet Commonly known as:  LIPITOR Take 40 mg by mouth daily.   glucose blood test strip 1 each 2 (two) times daily. Reported on 12/18/2015   metoprolol tartrate 50 MG tablet Commonly known as:  LOPRESSOR Take 1 tablet (50 mg total) by mouth 2 (two) times daily.   nitrofurantoin 100 MG capsule Commonly known as:  MACRODANTIN Take 100 mg by mouth at bedtime.   NOVOLOG MIX 70/30 FLEXPEN (70-30) 100 UNIT/ML FlexPen Generic drug:  insulin aspart protamine - aspart Inject 0.12 mLs (12 Units total) into the skin 2 (two) times daily with a meal.   pantoprazole 40 MG tablet Commonly known as:  PROTONIX Take 40 mg by mouth daily.   PHARMACIST CHOICE LANCETS Misc USE 2 TIMES DAILY FOR DIABETIC TESTING   tamsulosin 0.4 MG Caps capsule Commonly known as:  FLOMAX Take 0.4 mg by mouth daily. 30 minutes before the  same meal       Allergies:  Allergies  Allergen Reactions  . Ciprofloxacin Anaphylaxis  . Sulfamethoxazole-Trimethoprim Other (See Comments) and Rash    Went into [employer] Johnson Sydrome.  . Augmentin [Amoxicillin-Pot Clavulanate] Hives  . Levofloxacin Other (See Comments)  . Losartan Potassium-Hctz Other (See Comments)    Other reaction(s): Unknown  . Nitrofurantoin Other (See Comments)  . Sulfa Antibiotics Other (See Comments)    Levonne SpillerStevens johnson Steven's Johnsons  . Sulfasalazine     unknown  . Levaquin [Levofloxacin In D5w]     Family History: Family History  Problem Relation Age of Onset  . Diabetes Mellitus II Mother   . Heart disease Mother   . Lung cancer Father   . Diabetes Paternal Grandmother   . Diabetes Maternal Grandmother   . Kidney disease Neg Hx   . Prostate cancer Neg Hx   .  Bladder Cancer Neg Hx     Social History:  reports that  has never smoked. she has never used smokeless tobacco. She reports that she does not drink alcohol or use drugs.  ROS: UROLOGY Frequent Urination?: No Hard to postpone urination?: No Burning/pain with urination?: No Get up at night to urinate?: No Leakage of urine?: No Urine stream starts and stops?: No Trouble starting stream?: No Do you have to strain to urinate?: No Blood in urine?: Yes Urinary tract infection?: Yes Sexually transmitted disease?: No Injury to kidneys or bladder?: No Painful intercourse?: No Weak stream?: No Currently pregnant?: No Vaginal bleeding?: No Last menstrual period?: n  Gastrointestinal Nausea?: No Vomiting?: No Indigestion/heartburn?: No Diarrhea?: No Constipation?: No  Constitutional Fever: No Night sweats?: No Weight loss?: No Fatigue?: No  Skin Skin rash/lesions?: Yes Itching?: No  Eyes Blurred vision?: No Double vision?: No  Ears/Nose/Throat Sore throat?: No Sinus problems?: No  Hematologic/Lymphatic Swollen glands?: No Easy bruising?:  No  Cardiovascular Leg swelling?: No Chest pain?: No  Respiratory Cough?: No Shortness of breath?: No  Endocrine Excessive thirst?: No  Musculoskeletal Back pain?: No Joint pain?: No  Neurological Headaches?: No Dizziness?: Yes  Psychologic Depression?: No Anxiety?: No  Physical Exam: BP 136/74   Pulse (!) 53   Resp 16   Ht 5\' 5"  (1.651 m)   Wt 146 lb 12.8 oz (66.6 kg)   SpO2 98%   BMI 24.43 kg/m   Constitutional: Well nourished. Alert and oriented, No acute distress. HEENT: [place] AT, moist mucus membranes. Trachea midline, no masses. Cardiovascular: No clubbing, cyanosis, or edema. Respiratory: Normal respiratory effort, no increased work of breathing. GI: Abdomen is soft, non tender, non distended, no abdominal masses. Liver and spleen not palpable.  No hernias appreciated.  Stool sample for occult testing is not indicated.   SPT site with serosanguineous drainage.  There was some erythema in a linear pattern matching the SPT.  Suspect that tension has been placed on the line and irritated the skin GU: No CVA tenderness.  No bladder fullness or masses.   Skin: No rashes, bruises or suspicious lesions. Lymph: No cervical or inguinal adenopathy. Neurologic: Grossly intact, no focal deficits, moving all 4 extremities. Psychiatric: Normal mood and affect.  Laboratory Data See above  I have reviewed the labs.  Assessment & Plan:    1. Urinary retention:     - currently undergoing SPT placements  -Advised patient and her son to leave the suprapubic tube with some laxity and not to keep it under tension therefore preventing erosion of the suprapubic insertion site  -Patient to return for an office suprapubic exchange after her next IR insertion of SPT  2. History of recurrent UTI's  - patient on Macrobid suppression   3. Vaginal atrophy  - estrogen ring removed  4. Cystocele  - pessary removed                                      Return for RTC after SPT  dilation.  These notes generated with voice recognition software. I apologize for typographical errors.  Michiel Cowboy, PA-C  [facility] [address], [address] [place], [place] [address] [phone]

## 2018-02-15 ENCOUNTER — Encounter: Payer: Self-pay | Admitting: Urology

## 2018-02-15 ENCOUNTER — Ambulatory Visit (INDEPENDENT_AMBULATORY_CARE_PROVIDER_SITE_OTHER): Payer: Medicare Other | Admitting: Urology

## 2018-02-15 VITALS — BP 136/74 | HR 53 | Resp 16 | Ht 65.0 in | Wt 146.8 lb

## 2018-02-15 DIAGNOSIS — N8111 Cystocele, midline: Secondary | ICD-10-CM | POA: Diagnosis not present

## 2018-02-15 DIAGNOSIS — Z87440 Personal history of urinary (tract) infections: Secondary | ICD-10-CM

## 2018-02-15 DIAGNOSIS — R339 Retention of urine, unspecified: Secondary | ICD-10-CM | POA: Diagnosis not present

## 2018-02-15 DIAGNOSIS — N952 Postmenopausal atrophic vaginitis: Secondary | ICD-10-CM

## 2018-02-22 ENCOUNTER — Telehealth: Payer: Self-pay | Admitting: Radiology

## 2018-02-22 NOTE — Telephone Encounter (Signed)
LMOM for son to return call regarding SPT exchange appt.

## 2018-02-24 NOTE — Telephone Encounter (Signed)
Notified son of SPT exchange appt. Instructions given. Questions answered. Son voices understanding.

## 2018-03-09 ENCOUNTER — Ambulatory Visit (INDEPENDENT_AMBULATORY_CARE_PROVIDER_SITE_OTHER): Payer: Medicare Other | Admitting: Urology

## 2018-03-09 ENCOUNTER — Encounter: Payer: Self-pay | Admitting: Urology

## 2018-03-09 VITALS — BP 119/63 | HR 81 | Ht 65.0 in | Wt 145.0 lb

## 2018-03-09 DIAGNOSIS — R339 Retention of urine, unspecified: Secondary | ICD-10-CM | POA: Diagnosis not present

## 2018-03-09 DIAGNOSIS — Z87440 Personal history of urinary (tract) infections: Secondary | ICD-10-CM | POA: Diagnosis not present

## 2018-03-09 LAB — URINALYSIS, COMPLETE
Bilirubin, UA: NEGATIVE
GLUCOSE, UA: NEGATIVE
Nitrite, UA: NEGATIVE
Specific Gravity, UA: 1.015 (ref 1.005–1.030)
Urobilinogen, Ur: 0.2 mg/dL (ref 0.2–1.0)
pH, UA: 8.5 — ABNORMAL HIGH (ref 5.0–7.5)

## 2018-03-09 LAB — MICROSCOPIC EXAMINATION: WBC, UA: >30 /hpf — AB (ref 0–5)

## 2018-03-09 NOTE — Progress Notes (Signed)
03/09/2018 12:07 PM   Marlou Sa Nov 01, 1934 [id number]  Referring provider: Mick Sell, MD [address] [place], [place] [address]  Chief Complaint  Patient presents with  . Post-op Problem    HPI: Patient is a 84 year old Caucasian female with recurrent UTI's, mixed incontinence and vaginal atrophy who presents today for a follow up after issues with SPT with her caregiver, [name].    She underwent a CT guided SPT placement on 02/09/2018.   She was seen in the ED on 02/12/2018 with bleeding and drainage from around the suprapubic catheter.  Bedside ultrasound performed by the ED physician revealed no evidence of subcutaneous fluid.  UA was positive for TNTC RBC's and TNTC WBC's.   WBC count was 5.6.  She was reassured and discharged home.    She presented to the office on 02/15/2018 with the complaint of having some dark urine in the catheter bag.  She is still experiencing redness around the SPT site.  Patient denies any gross hematuria, dysuria or suprapubic/flank pain.  Patient denies any fevers, chills, nausea or vomiting.   She states she is still having some leakage from the urethra.  The tube was found to be on tension and son was advised to keep slack in the tube to prevent erosion of SPT site.    Today, she returns with the complaint of the SPT not draining, urinary incontinence, frequency, urgency and nocturia.  The caregiver states there is a foul odor to her urine and "white stuff" in the tubing.  Patient denies any gross hematuria, dysuria or suprapubic/flank pain.  Patient denies any fevers, chills, nausea or vomiting.   Her UA from the SPT was positive for > 30 WBC's, 11-30 RBC's and many bacteria.    Urinary retention Managed with SPT.    Recurrent UTI Patient was undergoing gentamicin instillations three times weekly in our office.  She was seen on 12/23/2017 by Dr. Sampson Goon and placed on suppressive Macrobid therapy.     Bladder wall  thickening A CT of the pelvis was performed on 04/14/2012 and it noted no hydronephrosis, but irregular thickened bladder wall with debris within the bladder and left renal cysts.  Patient underwent a cystoscopy on 09/27/2014 with Dr. Lonna Cobb and no abnormalities were noted.  Renal ultrasound completed on 09/29/2016 noted that the urinary bladder wall is irregularly thickened with increased vascularity questioning significant inflammatory change versus possible neoplastic infiltration within the urinary bladder. And a left renal cyst which appears slightly larger than previous study.   Patient underwent cystoscopy on 03/16/2017 with Dr. Mena Goes and bladder was irrigated of debris and two small diverticulum where inspected.  No tumors were seen.    Incontinence Managed with SPT.    PMH: Past Medical History:  Diagnosis Date  . A-fib (HCC) 08/28/2014   Overview:  Overview:  Diastolic dysfunction on ECHO 04/2012-done at [facility]   . Abnormal gait 02/08/2012  . Abscess or cellulitis of leg 04/18/2012  . Absence of bladder continence 07/29/2012  . Acid reflux 02/08/2012  . Acute kidney failure (HCC) 05/15/2012  . Afib, paroxysmal, with RVR at times with BBB 05/13/2012  . Allergic drug rash 05/15/2012  . Anemia 05/13/2012  . 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 Benign essential HTN 02/08/2012  . Bladder infection, chronic 06/29/2013  . BP (high blood pressure) 05/12/2012  . Bursitis of knee 08/07/2014  . Cellulitis in diabetic foot (HCC) 05/12/2012  . Chronic kidney disease 06/01/2012   Overview:  Overview:   12/2012- Renal US, simple L cyst. UIEP normal   . Clinical depression 08/28/2014  . CN (constipation) 03/17/2012  . Dermatitis due to drug reaction 05/15/2012  . Diabetes mellitus   . Diastolic dysfunction, left ventricle 05/14/2012  . DM (diabetes mellitus) (HCC) 05/12/2012  . Dysrhythmia   . Dysuria   . Edema leg 07/02/2014  . Enthesopathy of knee 08/07/2014  . Excess fluid volume 05/20/2012  . FOM  (frequency of micturition) 07/29/2012  . Frank hematuria 09/27/2014  . Gangrene (HCC)   . GERD (gastroesophageal reflux disease)   . Heart disease   . High cholesterol   . HTN (hypertension) 05/12/2012  . Hypercholesterolemia 05/12/2012  . Hypertension   . Incomplete bladder emptying 07/29/2012  . Open wound of knee, leg (except thigh), and ankle 06/01/2012  . 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   Overview:  Overview:  Several years ago, allergic reaction to sulfa antibiotic Several years ago, allergic reaction to sulfa antibiotic     Surgical History: Past Surgical History:  Procedure Laterality Date  . ABDOMINAL HYSTERECTOMY    . BLADDER SURGERY    . cataract surgery    . ENDOVENOUS ABLATION SAPHENOUS VEIN W/ LASER    . NASAL SINUS SURGERY    . TOE AMPUTATION    . TONSILLECTOMY      Home Medications:  Allergies as of 03/09/2018      Reactions   Ciprofloxacin Anaphylaxis   Sulfamethoxazole-trimethoprim Other (See Comments), Rash   Went into [employer] Sydrome.   Augmentin [amoxicillin-pot Clavulanate] Hives   Levofloxacin Other (See Comments)   Losartan Potassium-hctz Other (See Comments)   Other reaction(s): Unknown   Nitrofurantoin Other (See Comments)   Sulfa Antibiotics Other (See Comments)   Levonne Spiller Steven's Johnsons   Sulfasalazine    unknown   Levaquin [levofloxacin In D5w]       Medication List        Accurate as of 03/09/18 11:59 PM. Always use your most recent med list.          amiodarone 200 MG tablet Commonly known as:  PACERONE TAKE 1 TABLET (200 MG TOTAL) BY MOUTH ONCE DAILY.   amitriptyline 50 MG tablet Commonly known as:  ELAVIL TAKE 1 TABLET (50 MG TOTAL) BY MOUTH NIGHTLY.   aspirin EC 81 MG tablet Take 81 mg by mouth daily.   atorvastatin 40 MG tablet Commonly known as:  LIPITOR Take 40 mg by mouth daily.   glucose blood test strip 1 each 2 (two) times daily. Reported on 12/18/2015   metoprolol tartrate 50 MG  tablet Commonly known as:  LOPRESSOR Take 1 tablet (50 mg total) by mouth 2 (two) times daily.   nitrofurantoin 100 MG capsule Commonly known as:  MACRODANTIN Take 100 mg by mouth at bedtime.   NOVOLOG MIX 70/30 FLEXPEN (70-30) 100 UNIT/ML FlexPen Generic drug:  insulin aspart protamine - aspart Inject 0.12 mLs (12 Units total) into the skin 2 (two) times daily with a meal.   pantoprazole 40 MG tablet Commonly known as:  PROTONIX Take 40 mg by mouth daily.   PHARMACIST CHOICE LANCETS Misc USE 2 TIMES DAILY FOR DIABETIC TESTING   tamsulosin 0.4 MG Caps capsule Commonly known as:  FLOMAX Take 0.4 mg by mouth daily. 30 minutes before the same meal       Allergies:  Allergies  Allergen Reactions  . Ciprofloxacin Anaphylaxis  . Sulfamethoxazole-Trimethoprim Other (See Comments) and Rash    Went into [employer]  Sydrome.  . Augmentin [Amoxicillin-Pot Clavulanate] Hives  . Levofloxacin Other (See Comments)  . Losartan Potassium-Hctz Other (See Comments)    Other reaction(s): Unknown  . Nitrofurantoin Other (See Comments)  . Sulfa Antibiotics Other (See Comments)    Levonne SpillerStevens johnson Steven's Johnsons  . Sulfasalazine     unknown  . Levaquin [Levofloxacin In D5w]     Family History: Family History  Problem Relation Age of Onset  . Diabetes Mellitus II Mother   . Heart disease Mother   . Lung cancer Father   . Diabetes Paternal Grandmother   . Diabetes Maternal Grandmother   . Kidney disease Neg Hx   . Prostate cancer Neg Hx   . Bladder Cancer Neg Hx     Social History:  reports that she has never smoked. She has never used smokeless tobacco. She reports that she does not drink alcohol or use drugs.  ROS: UROLOGY Frequent Urination?: Yes Hard to postpone urination?: Yes Burning/pain with urination?: No Get up at night to urinate?: Yes Leakage of urine?: Yes Urine stream starts and stops?: No Trouble starting stream?: No Do you have to strain to urinate?:  No Blood in urine?: No Urinary tract infection?: No Sexually transmitted disease?: No Injury to kidneys or bladder?: No Painful intercourse?: No Weak stream?: No Currently pregnant?: No Vaginal bleeding?: No Last menstrual period?: N  Gastrointestinal Nausea?: No Vomiting?: No Indigestion/heartburn?: Yes Diarrhea?: No Constipation?: No  Constitutional Fever: No Night sweats?: No Weight loss?: No Fatigue?: No  Skin Skin rash/lesions?: No Itching?: No  Eyes Blurred vision?: No Double vision?: No  Ears/Nose/Throat Sore throat?: No Sinus problems?: Yes  Hematologic/Lymphatic Swollen glands?: No Easy bruising?: No  Cardiovascular Leg swelling?: No Chest pain?: No  Respiratory Cough?: Yes Shortness of breath?: No  Endocrine Excessive thirst?: Yes  Musculoskeletal Back pain?: No Joint pain?: No  Neurological Headaches?: No Dizziness?: No  Psychologic Depression?: No Anxiety?: No  Physical Exam: BP 119/63   Pulse 81   Ht 5\' 5"  (1.651 m)   Wt 145 lb (65.8 kg)   BMI 24.13 kg/m   Constitutional: Well nourished. Alert and oriented, No acute distress. HEENT: [place] AT, moist mucus membranes. Trachea midline, no masses. Cardiovascular: No clubbing, cyanosis, or edema. Respiratory: Normal respiratory effort, no increased work of breathing. GI: Abdomen is soft, non tender, non distended, no abdominal masses. Liver and spleen not palpable.  No hernias appreciated.  Stool sample for occult testing is not indicated.  SPT site is clean and dry. There is an area of firmness at the insertion site, but there is no erythema, fluctuance, crepitus or tenderness when palpating the area. GU: No CVA tenderness.  No bladder fullness or masses.   Skin: No rashes, bruises or suspicious lesions. Lymph: No cervical or inguinal adenopathy. Neurologic: Grossly intact, no focal deficits, moving all 4 extremities. Psychiatric: Normal mood and affect.  Laboratory Data See  above  I have reviewed the labs.  Assessment & Plan:    1. Urinary retention:    Explained to the caregiver that the current size SPT, 14 [place], will not be the final size and it is most likely being clogged with sediment and hopefully once we reach the point where we are putting in a regular 16 French Foley in place the clogging of the catheter will not be an issue Did demonstrate to the caregiver how to irrigate the catheter and gave her supplies to do so if it should stop draining prior to her next dilation  with IR  2. History of recurrent UTI's  - patient on Macrobid suppression  -UA suspicious for infection, will send for culture, will hold on prescribing an antibiotic until culture and sensitivities are available   3. Vaginal atrophy  - estrogen ring removed  4. Cystocele  - pessary removed                                      Return for after next SPT placement .  These notes generated with voice recognition software. I apologize for typographical errors.  Michiel Cowboy, PA-C  [facility] [address] [place], [place] [address] [phone]

## 2018-03-14 ENCOUNTER — Telehealth: Payer: Self-pay | Admitting: Urology

## 2018-03-14 ENCOUNTER — Telehealth: Payer: Self-pay

## 2018-03-14 LAB — CULTURE, URINE COMPREHENSIVE

## 2018-03-14 MED ORDER — NITROFURANTOIN MONOHYD MACRO 100 MG PO CAPS: 100.0000 mg | ORAL_CAPSULE | Freq: Two times a day (BID) | ORAL | 0 refills | Status: DC

## 2018-03-14 NOTE — Telephone Encounter (Signed)
Spoke with pt who stated she forgot to ask someone to go get the abx for her. Pt stated that she was wondering why she feels so bad. Reinforced with pt the importance of getting abx. Offered to call someone for her. Pt stated that she would call someone now.

## 2018-03-14 NOTE — Telephone Encounter (Signed)
macrobid sent to pharmacy 

## 2018-03-14 NOTE — Telephone Encounter (Signed)
-----   Message from Harle BattiestShannon A McGowan, PA-C sent at 03/14/2018 11:47 AM EDT ----- Is Mrs. Ebony CargoClayton currently taking Macrobid 100 mg qhs?

## 2018-03-14 NOTE — Telephone Encounter (Signed)
Pt called asking about Rx being called in.  I sent message to Leeroy BockChelsea [facility] for SunGardMacrobid to be called in.

## 2018-03-18 ENCOUNTER — Telehealth: Payer: Self-pay | Admitting: Urology

## 2018-03-18 ENCOUNTER — Encounter (INDEPENDENT_AMBULATORY_CARE_PROVIDER_SITE_OTHER): Payer: Self-pay

## 2018-03-18 ENCOUNTER — Other Ambulatory Visit: Payer: Self-pay | Admitting: Urology

## 2018-03-18 MED ORDER — NITROFURANTOIN MONOHYD MACRO 100 MG PO CAPS: 100.0000 mg | ORAL_CAPSULE | Freq: Two times a day (BID) | ORAL | 0 refills | Status: DC

## 2018-03-18 NOTE — Progress Notes (Signed)
[facility]

## 2018-03-19 NOTE — Telephone Encounter (Signed)
Error

## 2018-03-25 ENCOUNTER — Ambulatory Visit: Admission: RE | Admit: 2018-03-25 | Payer: Medicare Other | Source: Ambulatory Visit

## 2018-03-25 ENCOUNTER — Ambulatory Visit: Payer: Medicare Other

## 2018-03-28 ENCOUNTER — Ambulatory Visit: Admission: RE | Admit: 2018-03-28 | Payer: Medicare Other | Source: Ambulatory Visit

## 2018-03-28 ENCOUNTER — Ambulatory Visit: Payer: Medicare Other

## 2018-03-28 ENCOUNTER — Telehealth: Payer: Self-pay | Admitting: Radiology

## 2018-03-28 ENCOUNTER — Other Ambulatory Visit: Payer: Self-pay | Admitting: Radiology

## 2018-03-28 ENCOUNTER — Ambulatory Visit
Admission: RE | Admit: 2018-03-28 | Discharge: 2018-03-28 | Disposition: A | Discharge status: Home / Self Care | Payer: Medicare Other | Source: Ambulatory Visit | Attending: Urology | Admitting: Urology

## 2018-03-28 DIAGNOSIS — R339 Retention of urine, unspecified: Secondary | ICD-10-CM | POA: Insufficient documentation

## 2018-03-28 MED ORDER — IOPAMIDOL (ISOVUE-300) INJECTION 61%
30.0000 mL | Freq: Once | INTRAVENOUS | Status: AC | PRN
  Administered 2018-03-28: 30 mL

## 2018-03-28 NOTE — Procedures (Signed)
Pre procedural Dx: Urinary retention Post procedural Dx: Same  Technically successful fluoro guided exchange and up-sizing of new, slightly larger, now 16 Fr suprapubic catheter.  EBL: Minimal  Complications: None immediate  Katherina Right, MD Pager #: [phone]

## 2018-03-28 NOTE — Telephone Encounter (Signed)
Notified son of appt for SPT replacement today. Pt to arrive to [facility] before 3:00. Son voices understanding.

## 2018-03-28 NOTE — Progress Notes (Signed)
BG: 55 post suprapubic catheter replacement. Pt. Asymptomatic. Given 240 ml juice now  & sandwich to nibble on.

## 2018-03-28 NOTE — Telephone Encounter (Signed)
Son states suprapubic tube has been pulled out. Called interventional radiology. Waiting for instructions.

## 2018-03-29 LAB — GLUCOSE, CAPILLARY: Glucose-Capillary: 55 mg/dL — ABNORMAL LOW (ref 65–99)

## 2018-03-30 ENCOUNTER — Ambulatory Visit: Payer: Medicare Other

## 2018-03-31 IMAGING — US US RENAL
1 series · 13 of 25 positions shown · non-contrast
Comparison: May 16, 2012

CLINICAL DATA: Recurrent urinary tract infections. History of prior
renal cyst on the left

EXAM:
RENAL / URINARY TRACT ULTRASOUND COMPLETE

[Series 1: us renal · 0.23mm/px · 13 of 40 slices shown]
[im 1/40]
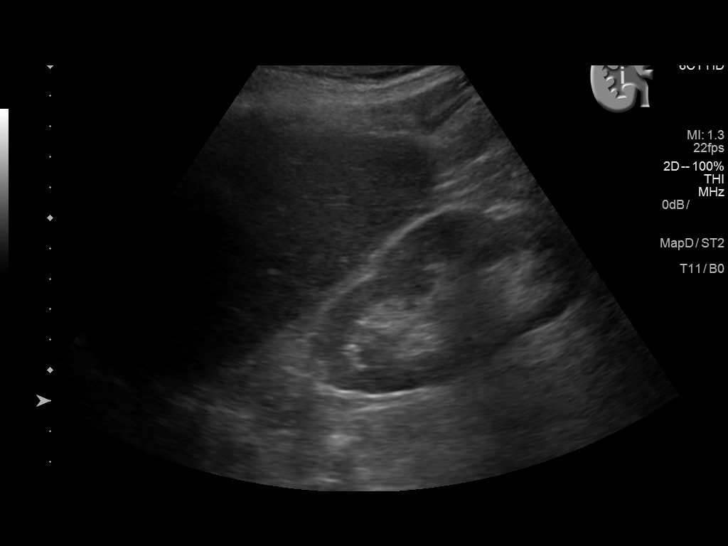
[im 4/40]
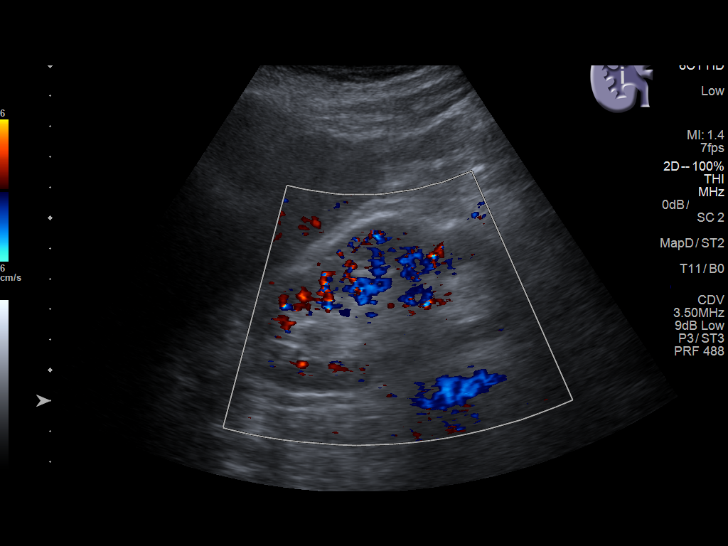
[im 7/40]
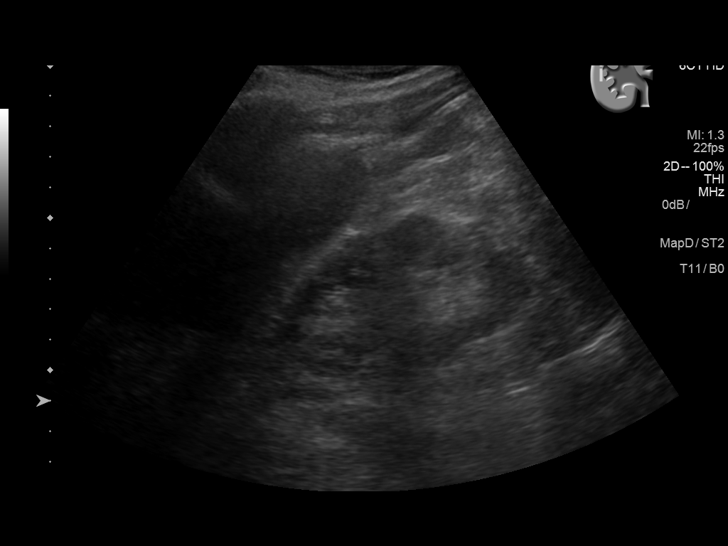
[im 10/40]
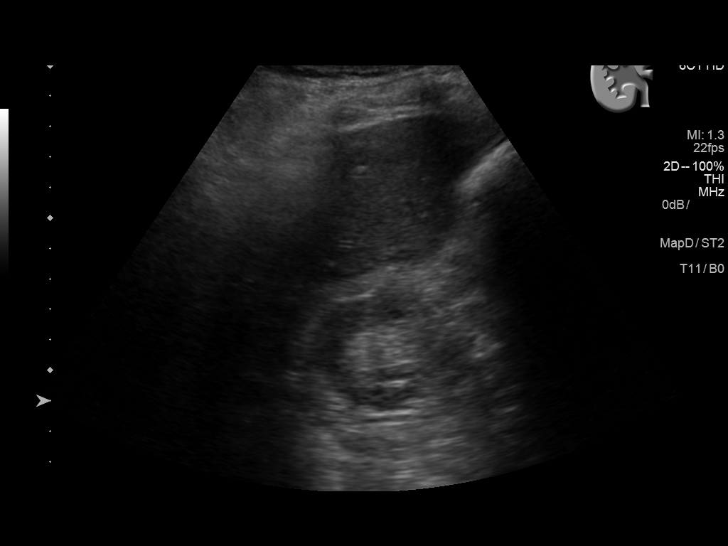
[im 14/40]
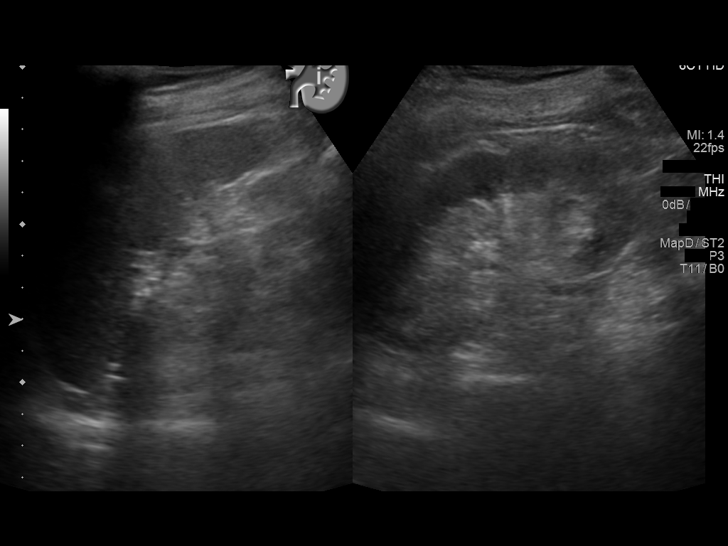
[im 17/40]
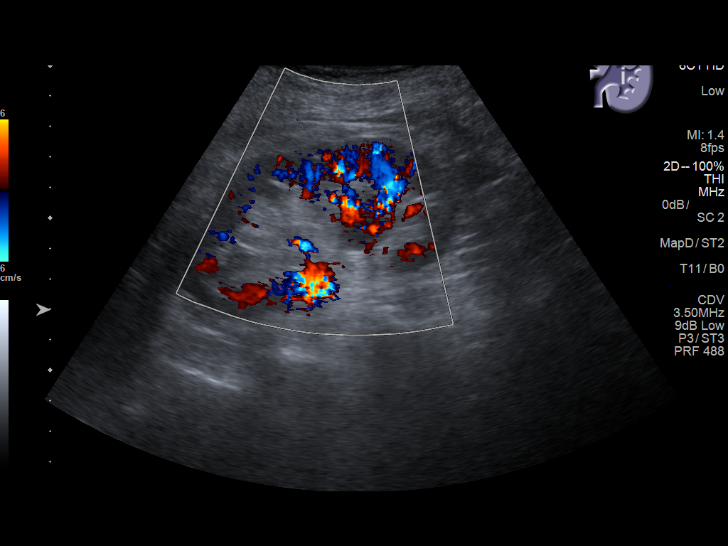
[im 20/40]
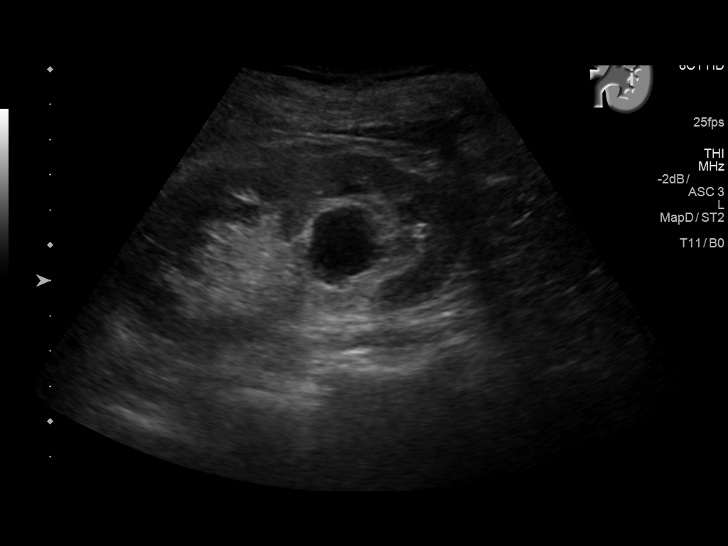
[im 23/40]
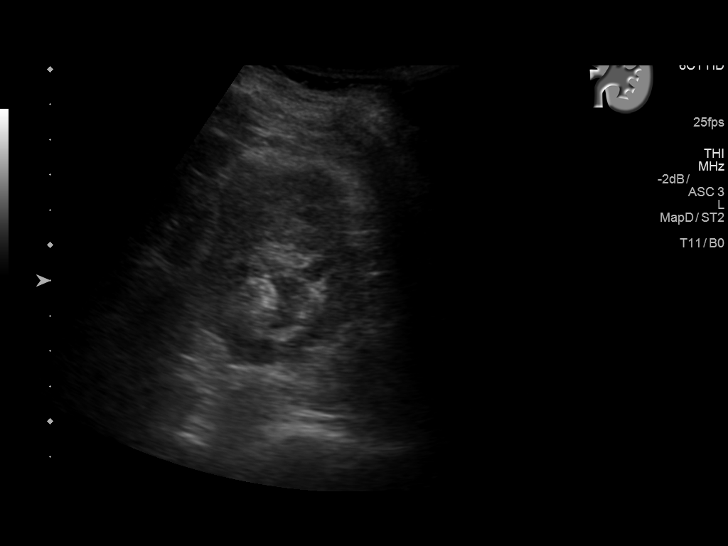
[im 27/40]
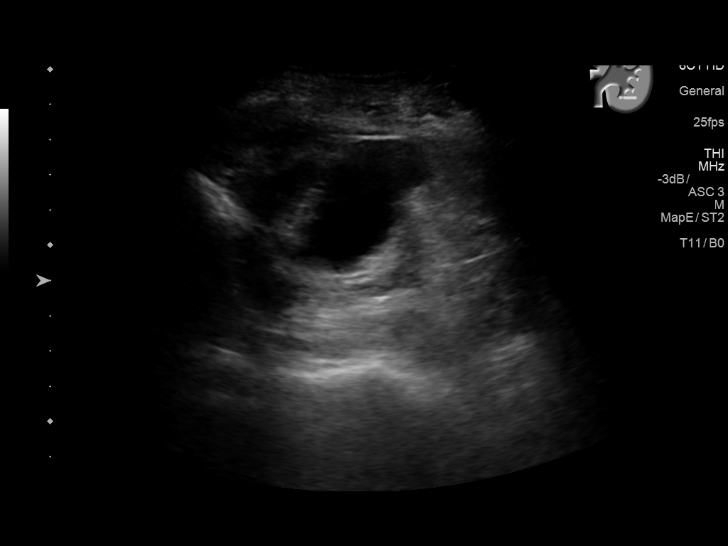
[im 30/40]
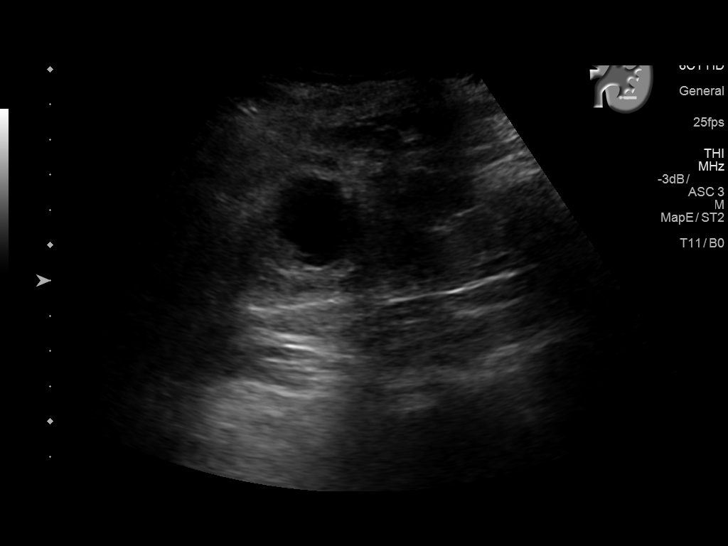
[im 33/40]
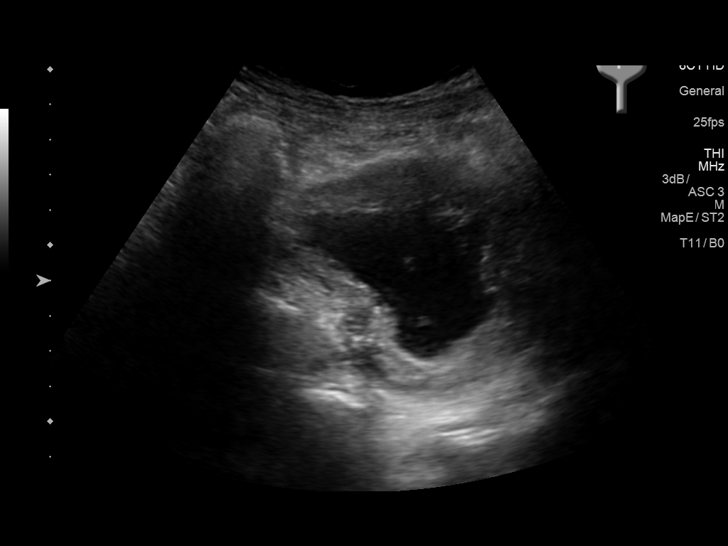
[im 36/40]
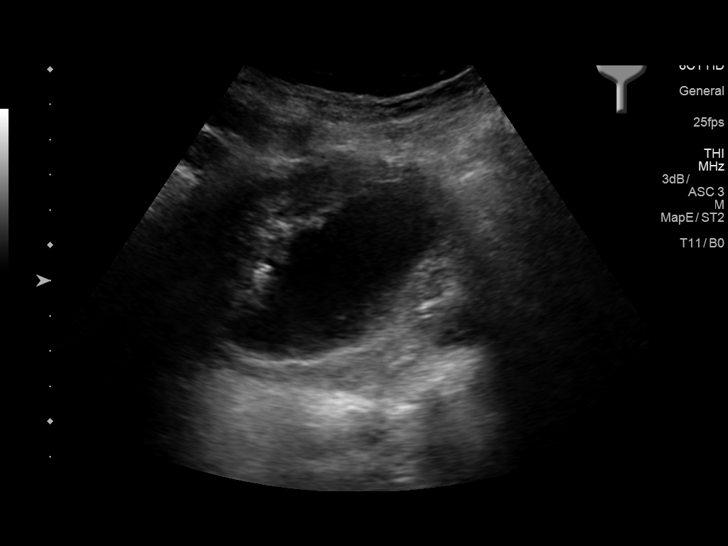
[im 40/40]
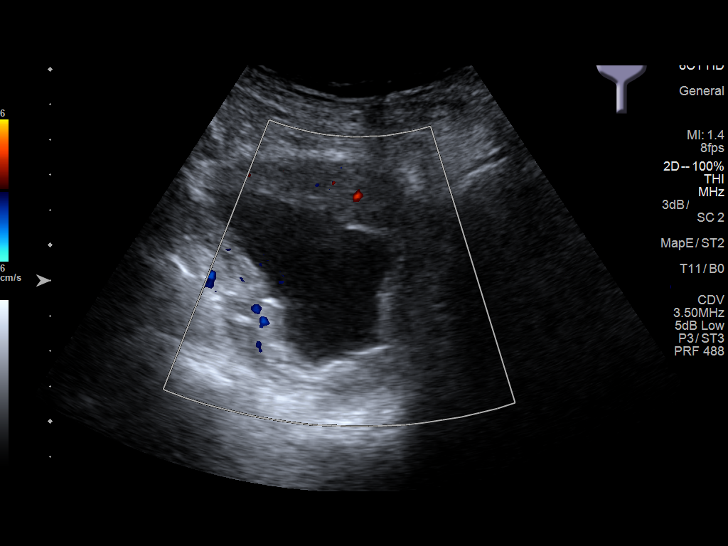

[13 of 25 positions shown; findings below may reference images not displayed]

FINDINGS: Right Kidney:

Length: 10.1 cm. Echogenicity within normal limits. Renal cortical
thickness low normal. No mass, perinephric fluid, or hydronephrosis
visualized. No sonographically demonstrable calculus or
ureterectasis.

Left Kidney:

Length: 10.8 cm. Echogenicity within normal limits. Renal cortical
thickness within normal limits. No perinephric fluid or
hydronephrosis visualized. There is a cyst in the mid to lower pole
left kidney measuring 4.4 x 2.4 x 2.6 cm which appears slightly
larger than on the previous study. No new mass evident on the left.
No sonographically demonstrable calculus or ureterectasis.

Bladder:

The urinary bladder wall is irregularly thickened and somewhat
hypervascular. A well-defined mass is not evident on this study in
the urinary bladder.
IMPRESSION: Urinary bladder has an irregular wall with increased vascularity.
Question significant inflammatory change versus possible neoplastic
infiltration within the urinary bladder. This finding may well
warrant cystoscopy and possible tissue sampling for further
assessment.

Cyst in left kidney has increased compared to prior study. No new or
noncystic mass is evident on either side.

No obstructing focus in either kidney.

These results will be called to the ordering clinician or
representative by the Radiologist Assistant, and communication
documented in the PACS or zVision Dashboard.

## 2018-04-22 ENCOUNTER — Emergency Department
Admission: EM | Admit: 2018-04-22 | Discharge: 2018-04-23 | Disposition: A | Discharge status: Home / Self Care | Payer: Medicare Other | Attending: Emergency Medicine | Admitting: Emergency Medicine

## 2018-04-22 ENCOUNTER — Encounter: Payer: Self-pay | Admitting: Emergency Medicine

## 2018-04-22 ENCOUNTER — Emergency Department: Payer: Medicare Other

## 2018-04-22 DIAGNOSIS — Z7982 Long term (current) use of aspirin: Secondary | ICD-10-CM | POA: Insufficient documentation

## 2018-04-22 DIAGNOSIS — F17210 Nicotine dependence, cigarettes, uncomplicated: Secondary | ICD-10-CM | POA: Insufficient documentation

## 2018-04-22 DIAGNOSIS — E119 Type 2 diabetes mellitus without complications: Secondary | ICD-10-CM | POA: Insufficient documentation

## 2018-04-22 DIAGNOSIS — N189 Chronic kidney disease, unspecified: Secondary | ICD-10-CM | POA: Insufficient documentation

## 2018-04-22 DIAGNOSIS — I129 Hypertensive chronic kidney disease with stage 1 through stage 4 chronic kidney disease, or unspecified chronic kidney disease: Secondary | ICD-10-CM | POA: Diagnosis not present

## 2018-04-22 DIAGNOSIS — T83198A Other mechanical complication of other urinary devices and implants, initial encounter: Secondary | ICD-10-CM | POA: Insufficient documentation

## 2018-04-22 DIAGNOSIS — Y69 Unspecified misadventure during surgical and medical care: Secondary | ICD-10-CM | POA: Diagnosis not present

## 2018-04-22 DIAGNOSIS — T83010A Breakdown (mechanical) of cystostomy catheter, initial encounter: Secondary | ICD-10-CM

## 2018-04-22 MED ORDER — ALBUTEROL SULFATE (2.5 MG/3ML) 0.083% IN NEBU: 5.0000 mg | INHALATION_SOLUTION | Freq: Once | RESPIRATORY_TRACT | Status: DC

## 2018-04-22 MED ORDER — KETAMINE HCL 10 MG/ML IJ SOLN: 50.0000 mg | Freq: Once | INTRAMUSCULAR | Status: DC

## 2018-04-22 MED ORDER — IPRATROPIUM BROMIDE 0.02 % IN SOLN: 0.5000 mg | Freq: Once | RESPIRATORY_TRACT | Status: DC

## 2018-04-22 NOTE — ED Notes (Signed)
Pt states she accidentally stepped on her catheter, pulling her suprapubic catheter out. Pt with noted removed suprapubic catheter. Pt with stoma noted to mid pelvis, leaking foul odored urine.

## 2018-04-22 NOTE — ED Notes (Signed)
md in with rn assist to insert suprapubic catheter

## 2018-04-22 NOTE — ED Notes (Signed)
Attempt to call [facility] x4 to give report and arrange ride home without success. No answer on phone at [facility].

## 2018-04-22 NOTE — ED Triage Notes (Signed)
Patient brought in by ems from [facility]. Patient accidentally removed ger suprapubic catheter.

## 2018-04-22 NOTE — ED Provider Notes (Addendum)
[facility] Emergency Department Provider Note  ____________________________________________   I have reviewed the triage vital signs and the nursing notes. Where available I have reviewed prior notes and, if possible and indicated, outside hospital notes.    HISTORY  Chief Complaint suprapubic catheter complication    HPI Pamela [name] is a 84 y.o. female who has a suprapubic catheter appears since February, it looks as if it was a 16, the catheter has been pulled out, or for a lot, patient is not sure.  She thinks it may have happened last night.  [facility] is where she lives and they have not answered their phone for the last 2 hours, I did talk to her son who does not know when it was last in.  Patient thinks he was out either this morning or last night.  In any event, she still does make urine from below, she is not having any pain fever vomiting, she has no bleeding or other complaints.     Past Medical History:  Diagnosis Date  . A-fib (HCC) 08/28/2014   Overview:  Overview:  Diastolic dysfunction on ECHO 04/2012-done at [facility]   . Abnormal gait 02/08/2012  . Abscess or cellulitis of leg 04/18/2012  . Absence of bladder continence 07/29/2012  . Acid reflux 02/08/2012  . Acute kidney failure (HCC) 05/15/2012  . Afib, paroxysmal, with RVR at times with BBB 05/13/2012  . Allergic drug rash 05/15/2012  . Anemia 05/13/2012  . 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 Benign essential HTN 02/08/2012  . Bladder infection, chronic 06/29/2013  . BP (high blood pressure) 05/12/2012  . Bursitis of knee 08/07/2014  . Cellulitis in diabetic foot (HCC) 05/12/2012  . Chronic kidney disease 06/01/2012   Overview:  Overview:   12/2012- Renal US, simple L cyst. UIEP normal   . Clinical depression 08/28/2014  . CN (constipation) 03/17/2012  . Dermatitis due to drug reaction 05/15/2012  . Diabetes mellitus   . Diastolic dysfunction, left ventricle 05/14/2012  . DM (diabetes mellitus)  (HCC) 05/12/2012  . Dysrhythmia   . Dysuria   . Edema leg 07/02/2014  . Enthesopathy of knee 08/07/2014  . Excess fluid volume 05/20/2012  . FOM (frequency of micturition) 07/29/2012  . Frank hematuria 09/27/2014  . Gangrene (HCC)   . GERD (gastroesophageal reflux disease)   . Heart disease   . High cholesterol   . HTN (hypertension) 05/12/2012  . Hypercholesterolemia 05/12/2012  . Hypertension   . Incomplete bladder emptying 07/29/2012  . Open wound of knee, leg (except thigh), and ankle 06/01/2012  . 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   Overview:  Overview:  Several years ago, allergic reaction to sulfa antibiotic Several years ago, allergic reaction to sulfa antibiotic     Patient Active Problem List   Diagnosis Date Noted  . UTI (urinary tract infection) 11/28/2017  . Pelvic muscle wasting 01/26/2017  . Cystocele, midline 01/26/2017  . Urinary frequency 12/26/2015  . Incontinence 12/07/2015  . Cystocele 12/07/2015  . Tinea cruris 12/07/2015  . Recurrent UTI 08/11/2015  . Vaginal atrophy 08/11/2015  . Fall 08/11/2015  . 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 Discoordination 05/08/2015  . Frank hematuria 09/27/2014  . A-fib (HCC) 08/28/2014  . Clinical depression 08/28/2014  . Atrial fibrillation (HCC) 08/28/2014  . Major depressive disorder with single episode 08/28/2014  . Bursitis of knee 08/07/2014  . Enthesopathy of knee 08/07/2014  . Edema leg 07/02/2014  . Bladder infection, chronic 06/29/2013  . Incomplete bladder emptying  07/29/2012  . FOM (frequency of micturition) 07/29/2012  . Absence of bladder continence 07/29/2012  . Chronic kidney disease 06/01/2012  . Open wound of knee, leg (except thigh), and ankle 06/01/2012  . Skin rash 05/20/2012  . Fluid overload 05/20/2012  . Excess fluid volume 05/20/2012  . Acute kidney failure (HCC) 05/15/2012  . Allergic drug rash 05/15/2012  . 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  . Dermatitis due to drug taken  internally 05/15/2012  . Dermatitis due to drug reaction 05/15/2012  . Acute renal failure (HCC) 05/15/2012  . Diastolic dysfunction, left ventricle 05/14/2012  . Afib, paroxysmal, with RVR at times with BBB 05/13/2012  . Anemia 05/13/2012  . Cellulitis in diabetic foot (HCC) 05/12/2012  . DM (diabetes mellitus) (HCC) 05/12/2012  . HTN (hypertension) 05/12/2012  . Hypercholesterolemia 05/12/2012  . BP (high blood pressure) 05/12/2012  . Abscess or cellulitis of leg 04/18/2012  . Diabetes mellitus (HCC) 04/18/2012  . CN (constipation) 03/17/2012  . Abnormal gait 02/08/2012  . Benign essential HTN 02/08/2012  . Acid reflux 02/08/2012    Past Surgical History:  Procedure Laterality Date  . ABDOMINAL HYSTERECTOMY    . BLADDER SURGERY    . cataract surgery    . ENDOVENOUS ABLATION SAPHENOUS VEIN W/ LASER    . NASAL SINUS SURGERY    . TOE AMPUTATION    . TONSILLECTOMY      Prior to Admission medications   Medication Sig Start Date End Date Taking? Authorizing Provider  amiodarone (PACERONE) 200 MG tablet TAKE 1 TABLET (200 MG TOTAL) BY MOUTH ONCE DAILY. 12/16/15   [provider]  amitriptyline (ELAVIL) 50 MG tablet TAKE 1 TABLET (50 MG TOTAL) BY MOUTH NIGHTLY. 03/31/16   [provider]  aspirin EC 81 MG tablet Take 81 mg by mouth daily.    [provider]  atorvastatin (LIPITOR) 40 MG tablet Take 40 mg by mouth daily.    [provider]  glucose blood test strip 1 each 2 (two) times daily. Reported on 12/18/2015 05/22/15   [provider]  metoprolol (LOPRESSOR) 50 MG tablet Take 1 tablet (50 mg total) by mouth 2 (two) times daily. 05/23/12   Osvaldo Shipper, MD  nitrofurantoin (MACRODANTIN) 100 MG capsule Take 100 mg by mouth at bedtime.    [provider]  nitrofurantoin, macrocrystal-monohydrate, (MACROBID) 100 MG capsule Take 1 capsule (100 mg total) by mouth every 12 (twelve) hours. 03/18/18   McGowan, Carollee Herter A, PA-C  NOVOLOG  MIX 70/30 FLEXPEN (70-30) 100 UNIT/ML FlexPen Inject 0.12 mLs (12 Units total) into the skin 2 (two) times daily with a meal. 11/30/17   Mody, Sital, MD  pantoprazole (PROTONIX) 40 MG tablet Take 40 mg by mouth daily.    [provider]  PHARMACIST CHOICE LANCETS MISC USE 2 TIMES DAILY FOR DIABETIC TESTING 05/22/15   [provider]  tamsulosin (FLOMAX) 0.4 MG CAPS capsule Take 0.4 mg by mouth daily. 30 minutes before the same meal 03/19/16   [provider]    Allergies Ciprofloxacin; Sulfamethoxazole-trimethoprim; Augmentin [amoxicillin-pot clavulanate]; Levofloxacin; Losartan potassium-hctz; Nitrofurantoin; Sulfa antibiotics; Sulfasalazine; and Levaquin [levofloxacin in d5w]  Family History  Problem Relation Age of Onset  . Diabetes Mellitus II Mother   . Heart disease Mother   . Lung cancer Father   . Diabetes Paternal Grandmother   . Diabetes Maternal Grandmother   . Kidney disease Neg Hx   . Prostate cancer Neg Hx   . Bladder Cancer Neg Hx  Social History Social History   Tobacco Use  . Smoking status: Never Smoker  . Smokeless tobacco: Never Used  Substance Use Topics  . Alcohol use: No    Alcohol/week: 0.0 oz  . Drug use: No    Review of Systems Constitutional: No fever/chills Eyes: No visual changes. ENT: No sore throat. No stiff neck no neck pain Cardiovascular: Denies chest pain. Respiratory: Denies shortness of breath. Gastrointestinal:   no vomiting.  No diarrhea.  No constipation. Genitourinary: Negative for dysuria. Musculoskeletal: Negative lower extremity swelling Skin: Negative for rash. Neurological: Negative for severe headaches, focal weakness or numbness.   ____________________________________________   PHYSICAL EXAM:  VITAL SIGNS: ED Triage Vitals  Enc Vitals Group     BP 04/22/18 2007 (!) 153/66     Pulse Rate 04/22/18 2007 (!) 56     Resp 04/22/18 2007 18     Temp 04/22/18 2007 99 F (37.2 C)     Temp src --       SpO2 04/22/18 2007 100 %     Weight 04/22/18 2005 145 lb (65.8 kg)     Height 04/22/18 2005  (1.651 m)     Head Circumference --      Peak Flow --      Pain Score 04/22/18 2005 0     Pain Loc --      Pain Edu? --      Excl. in GC? --     Constitutional: Alert and oriented to name and place unsure of the exact date not confused however. Well appearing and in no acute distress. Eyes: Conjunctivae are normal Head: Atraumatic HEENT: No congestion/rhinnorhea. Mucous membranes are moist.  Oropharynx non-erythematous Neck:   Nontender with no meningismus, no masses, no stridor Cardiovascular: Normal rate, regular rhythm. Grossly normal heart sounds.  Good peripheral circulation. Respiratory: Normal respiratory effort.  No retractions. Lungs CTAB. Abdominal: Soft and suprapubic fullness is appreciated, the area of suprapubic catheter is noted.  No bleeding or purulence or erythema noted. No distention. No guarding no rebound Back:  There is no focal tenderness or step off.  there is no midline tenderness there are no lesions noted. there is no CVA tenderness Musculoskeletal: No lower extremity tenderness, no upper extremity tenderness. No joint effusions, no DVT signs strong distal pulses no edema Neurologic:  Normal speech and language. No gross focal neurologic deficits are appreciated.  Skin:  Skin is warm, dry and intact. No rash noted. Psychiatric: Mood and affect are normal. Speech and behavior are normal.  ____________________________________________   LABS (all labs ordered are listed, but only abnormal results are displayed)  Labs Reviewed - No data to display  Pertinent labs  results that were available during my care of the patient were reviewed by me and considered in my medical decision making (see chart for details). ____________________________________________  EKG  I personally interpreted any EKGs ordered by me or  triage  ____________________________________________  RADIOLOGY  Pertinent labs & imaging results that were available during my care of the patient were reviewed by me and considered in my medical decision making (see chart for details). If possible, patient and/or family made aware of any abnormal findings.  No results found. ____________________________________________    PROCEDURES  Procedure(s) performed: None  Procedures  Critical Care performed: None  ____________________________________________   INITIAL IMPRESSION / ASSESSMENT AND PLAN / ED COURSE  Pertinent labs & imaging results that were available during my care of the patient were reviewed by me  and considered in my medical decision making (see chart for details).  Patient here with a dislodged suprapubic catheter, we did place a regular catheter and to drain the bladder as I did initially try to place a 16 coude and was unable to pass or even find the stoma really past the skin.  It was my concern that perhaps the bladder wall was being deformed by the pressure of an released urine and therefore we placed an Foley catheter below.  This allowed [place] to decompress the bladder, we will now clamp that Foley so we can get some urine buildup so that I can try to replace the suprapubic catheter again, if I cannot do that, we likely will have to discharge her with a regular Foley catheter and close outpatient follow-up with urology.  ----------------------------------------- 12:06 AM on 04/23/2018 -----------------------------------------  Unable to pass Foley, there meeting resistance did not want to push it, I think that this is been out for some time, I feel that aggressive attempt at replacement would likely cause more trauma than benefit especially as we are able to place a Foley which is draining well.  Discussed with Dr. Alvester Morin of urology, who agrees with management.  Appreciate consult.  Patient will follow-up as an  outpatient with her urologist.  Return precautions follow-up given and understood   ____________________________________________   FINAL CLINICAL IMPRESSION(S) / ED DIAGNOSES  Final diagnoses:  None      This chart was dictated using voice recognition software.  Despite best efforts to proofread,  errors can occur which can change meaning.      Jeanmarie Plant, MD 04/22/18 [id number]    Jeanmarie Plant, MD 04/23/18 [phone]

## 2018-04-23 NOTE — Discharge Instructions (Addendum)
Use the Foley bag as you would your regular Foley that you usually have, if you have abdominal pain, vomiting, redness or swelling or signs of infection or fever, return to the emergency room.  Follow closely with your urologist for replacement of your suprapubic catheter

## 2018-04-23 NOTE — ED Notes (Signed)
Pt provided with snack by ed tech

## 2018-04-26 ENCOUNTER — Ambulatory Visit: Payer: Medicare Other

## 2018-04-29 ENCOUNTER — Emergency Department: Payer: Medicare Other

## 2018-04-29 ENCOUNTER — Ambulatory Visit: Payer: Medicare Other | Admitting: Urology

## 2018-04-29 ENCOUNTER — Other Ambulatory Visit: Payer: Self-pay

## 2018-04-29 ENCOUNTER — Emergency Department
Admission: EM | Admit: 2018-04-29 | Discharge: 2018-04-29 | Disposition: A | Discharge status: Home / Self Care | Payer: Medicare Other | Attending: Emergency Medicine | Admitting: Emergency Medicine

## 2018-04-29 DIAGNOSIS — T83510A Infection and inflammatory reaction due to cystostomy catheter, initial encounter: Secondary | ICD-10-CM | POA: Diagnosis not present

## 2018-04-29 DIAGNOSIS — E1122 Type 2 diabetes mellitus with diabetic chronic kidney disease: Secondary | ICD-10-CM | POA: Diagnosis not present

## 2018-04-29 DIAGNOSIS — N390 Urinary tract infection, site not specified: Secondary | ICD-10-CM | POA: Diagnosis not present

## 2018-04-29 DIAGNOSIS — Z79899 Other long term (current) drug therapy: Secondary | ICD-10-CM | POA: Diagnosis not present

## 2018-04-29 DIAGNOSIS — I130 Hypertensive heart and chronic kidney disease with heart failure and stage 1 through stage 4 chronic kidney disease, or unspecified chronic kidney disease: Secondary | ICD-10-CM | POA: Diagnosis not present

## 2018-04-29 DIAGNOSIS — N189 Chronic kidney disease, unspecified: Secondary | ICD-10-CM | POA: Diagnosis not present

## 2018-04-29 DIAGNOSIS — Z7982 Long term (current) use of aspirin: Secondary | ICD-10-CM | POA: Insufficient documentation

## 2018-04-29 DIAGNOSIS — Z794 Long term (current) use of insulin: Secondary | ICD-10-CM | POA: Diagnosis not present

## 2018-04-29 DIAGNOSIS — R05 Cough: Secondary | ICD-10-CM | POA: Diagnosis present

## 2018-04-29 DIAGNOSIS — I5030 Unspecified diastolic (congestive) heart failure: Secondary | ICD-10-CM | POA: Diagnosis not present

## 2018-04-29 DIAGNOSIS — Y828 Other medical devices associated with adverse incidents: Secondary | ICD-10-CM | POA: Insufficient documentation

## 2018-04-29 DIAGNOSIS — J189 Pneumonia, unspecified organism: Secondary | ICD-10-CM | POA: Insufficient documentation

## 2018-04-29 HISTORY — DX: Left bundle-branch block, unspecified: I44.7

## 2018-04-29 HISTORY — DX: Heart failure, unspecified: I50.9

## 2018-04-29 LAB — CBC WITH DIFFERENTIAL/PLATELET
BASOS ABS: 0 10*3/uL (ref 0–0.1)
BASOS PCT: 0 %
Eosinophils Absolute: 0 10*3/uL (ref 0–0.7)
Eosinophils Relative: 1 %
HCT: 32.2 % — ABNORMAL LOW (ref 35.0–47.0)
HEMOGLOBIN: 10.8 g/dL — AB (ref 12.0–16.0)
Lymphocytes Relative: 15 %
Lymphs Abs: 1.3 10*3/uL (ref 1.0–3.6)
MCH: 31.8 pg (ref 26.0–34.0)
MCHC: 33.7 g/dL (ref 32.0–36.0)
MCV: 94.5 fL (ref 80.0–100.0)
MONOS PCT: 13 %
Monocytes Absolute: 1.1 10*3/uL — ABNORMAL HIGH (ref 0.2–0.9)
NEUTROS ABS: 6.1 10*3/uL (ref 1.4–6.5)
NEUTROS PCT: 71 %
Platelets: 188 10*3/uL (ref 150–440)
RBC: 3.4 MIL/uL — AB (ref 3.80–5.20)
RDW: 14.9 % — ABNORMAL HIGH (ref 11.5–14.5)
WBC: 8.6 10*3/uL (ref 3.6–11.0)

## 2018-04-29 LAB — COMPREHENSIVE METABOLIC PANEL
ALT: 14 U/L (ref 14–54)
ANION GAP: 11 (ref 5–15)
AST: 15 U/L (ref 15–41)
Albumin: 3 g/dL — ABNORMAL LOW (ref 3.5–5.0)
Alkaline Phosphatase: 84 U/L (ref 38–126)
BILIRUBIN TOTAL: 0.7 mg/dL (ref 0.3–1.2)
BUN: 29 mg/dL — AB (ref 6–20)
CHLORIDE: 102 mmol/L (ref 101–111)
CO2: 22 mmol/L (ref 22–32)
Calcium: 9.1 mg/dL (ref 8.9–10.3)
Creatinine, Ser: 1.24 mg/dL — ABNORMAL HIGH (ref 0.44–1.00)
GFR calc Af Amer: 46 mL/min — ABNORMAL LOW (ref >60)
GFR calc non Af Amer: 39 mL/min — ABNORMAL LOW (ref >60)
Glucose, Bld: 226 mg/dL — ABNORMAL HIGH (ref 65–99)
POTASSIUM: 3.6 mmol/L (ref 3.5–5.1)
Sodium: 135 mmol/L (ref 135–145)
TOTAL PROTEIN: 6.7 g/dL (ref 6.5–8.1)

## 2018-04-29 LAB — BRAIN NATRIURETIC PEPTIDE: B NATRIURETIC PEPTIDE 5: 246 pg/mL — AB (ref 0.0–100.0)

## 2018-04-29 LAB — URINALYSIS, ROUTINE W REFLEX MICROSCOPIC
BILIRUBIN URINE: NEGATIVE
GLUCOSE, UA: NEGATIVE mg/dL
HGB URINE DIPSTICK: NEGATIVE
KETONES UR: NEGATIVE mg/dL
NITRITE: NEGATIVE
PH: 7 (ref 5.0–8.0)
PROTEIN: 100 mg/dL — AB
Specific Gravity, Urine: 1.017 (ref 1.005–1.030)
WBC, UA: >50 WBC/hpf — ABNORMAL HIGH (ref 0–5)

## 2018-04-29 LAB — LACTIC ACID, PLASMA: LACTIC ACID, VENOUS: 1 mmol/L (ref 0.5–1.9)

## 2018-04-29 LAB — TROPONIN I: Troponin I: <0.03 ng/mL (ref <0.03)

## 2018-04-29 LAB — PROCALCITONIN: Procalcitonin: 0.1 ng/mL

## 2018-04-29 MED ORDER — CEPHALEXIN 500 MG PO CAPS: 500.0000 mg | ORAL_CAPSULE | Freq: Three times a day (TID) | ORAL | 0 refills | Status: DC

## 2018-04-29 MED ORDER — DOXYCYCLINE HYCLATE 100 MG PO TABS
100.0000 mg | ORAL_TABLET | Freq: Once | ORAL | Status: AC
  Administered 2018-04-29: 100 mg via ORAL
  Filled 2018-04-29: qty 1

## 2018-04-29 MED ORDER — SODIUM CHLORIDE 0.9 % IV SOLN
1.0000 g | INTRAVENOUS | Status: AC
  Administered 2018-04-29: 1 g via INTRAVENOUS
  Filled 2018-04-29: qty 10

## 2018-04-29 MED ORDER — DOXYCYCLINE HYCLATE 100 MG PO CAPS: ORAL_CAPSULE | ORAL | 0 refills | Status: DC

## 2018-04-29 NOTE — ED Notes (Signed)
Pt given peanut butter crackers and diet sprite. Pt in NAD at this time.

## 2018-04-29 NOTE — ED Notes (Signed)

## 2018-04-29 NOTE — ED Notes (Signed)
Pt waiting for EMS for transport back to [facility], pt sleeping at this time

## 2018-04-29 NOTE — ED Notes (Signed)
Pt placed on 2L due to O2 90% on RA, pt sleeping at this time.

## 2018-04-29 NOTE — ED Notes (Signed)
Pt's son has not answered phone x2 for ride for pt discharge.

## 2018-04-29 NOTE — ED Notes (Signed)
EDP took pt off 2L, will continue to monitor

## 2018-04-29 NOTE — ED Provider Notes (Signed)
[facility] Emergency Department Provider Note  ____________________________________________   First MD Initiated Contact with Patient 04/29/18 0158     (approximate)  I have reviewed the triage vital signs and the nursing notes.   HISTORY  Chief Complaint Cough and Recurrent UTI    HPI Pamela [name] is a 84 y.o. female with extensive chronic medical history and who lives in independent living facility who presents for evaluation of about a week of gradually worsening cough and chest congestion.  She reports that she feels something rattling in her chest but she cannot quite get it out so her cough is been nonproductive.  She reports that she checked her temperature today when she felt febrile and was feeling chills and her temperature was 102.  She took a Tylenol and subsequently when the  Living facility checked her temperature she was afebrile and she was afebrile when she came to the emergency department.  She reports feeling general malaise which is mild but the cough is moderate to severe.  She denies neck pain, shortness of breath, nausea, vomiting, and abdominal pain.  She has a suprapubic catheter chronically.   Past Medical History:  Diagnosis Date  . A-fib (HCC) 08/28/2014   Overview:  Overview:  Diastolic dysfunction on ECHO 04/2012-[facility]   . Abnormal gait 02/08/2012  . Abscess or cellulitis of leg 04/18/2012  . Absence of bladder continence 07/29/2012  . Acid reflux 02/08/2012  . Acute kidney failure (HCC) 05/15/2012  . Afib, paroxysmal, with RVR at times with BBB 05/13/2012  . Allergic drug rash 05/15/2012  . Anemia 05/13/2012  . 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 Benign essential HTN 02/08/2012  . Bladder infection, chronic 06/29/2013  . BP (high blood pressure) 05/12/2012  . Bursitis of knee 08/07/2014  . Cellulitis in diabetic foot (HCC) 05/12/2012  . CHF (congestive heart failure) (HCC)   . Chronic kidney disease 06/01/2012   Overview:   Overview:   12/2012- Renal US, simple L cyst. UIEP normal   . Clinical depression 08/28/2014  . CN (constipation) 03/17/2012  . Dermatitis due to drug reaction 05/15/2012  . Diabetes mellitus   . Diastolic dysfunction, left ventricle 05/14/2012  . DM (diabetes mellitus) (HCC) 05/12/2012  . Dysrhythmia   . Dysuria   . Edema leg 07/02/2014  . Enthesopathy of knee 08/07/2014  . Excess fluid volume 05/20/2012  . FOM (frequency of micturition) 07/29/2012  . Frank hematuria 09/27/2014  . Gangrene (HCC)   . GERD (gastroesophageal reflux disease)   . Heart disease   . High cholesterol   . HTN (hypertension) 05/12/2012  . Hypercholesterolemia 05/12/2012  . Hypertension   . Incomplete bladder emptying 07/29/2012  . Left bundle branch block (LBBB)   . Open wound of knee, leg (except thigh), and ankle 06/01/2012  . 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   Overview:  Overview:  Several years ago, allergic reaction to sulfa antibiotic Several years ago, allergic reaction to sulfa antibiotic     Patient Active Problem List   Diagnosis Date Noted  . UTI (urinary tract infection) 11/28/2017  . Pelvic muscle wasting 01/26/2017  . Cystocele, midline 01/26/2017  . Urinary frequency 12/26/2015  . Incontinence 12/07/2015  . Cystocele 12/07/2015  . Tinea cruris 12/07/2015  . Recurrent UTI 08/11/2015  . Vaginal atrophy 08/11/2015  . Fall 08/11/2015  . 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 Discoordination 05/08/2015  . Frank hematuria 09/27/2014  . A-fib (HCC) 08/28/2014  . Clinical depression 08/28/2014  .  Atrial fibrillation (HCC) 08/28/2014  . Major depressive disorder with single episode 08/28/2014  . Bursitis of knee 08/07/2014  . Enthesopathy of knee 08/07/2014  . Edema leg 07/02/2014  . Bladder infection, chronic 06/29/2013  . Incomplete bladder emptying 07/29/2012  . FOM (frequency of micturition) 07/29/2012  . Absence of bladder continence 07/29/2012  . Chronic kidney disease 06/01/2012  . Open  wound of knee, leg (except thigh), and ankle 06/01/2012  . Skin rash 05/20/2012  . Fluid overload 05/20/2012  . Excess fluid volume 05/20/2012  . Acute kidney failure (HCC) 05/15/2012  . Allergic drug rash 05/15/2012  . 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  . Dermatitis due to drug taken internally 05/15/2012  . Dermatitis due to drug reaction 05/15/2012  . Acute renal failure (HCC) 05/15/2012  . Diastolic dysfunction, left ventricle 05/14/2012  . Afib, paroxysmal, with RVR at times with BBB 05/13/2012  . Anemia 05/13/2012  . Cellulitis in diabetic foot (HCC) 05/12/2012  . DM (diabetes mellitus) (HCC) 05/12/2012  . HTN (hypertension) 05/12/2012  . Hypercholesterolemia 05/12/2012  . BP (high blood pressure) 05/12/2012  . Abscess or cellulitis of leg 04/18/2012  . Diabetes mellitus (HCC) 04/18/2012  . CN (constipation) 03/17/2012  . Abnormal gait 02/08/2012  . Benign essential HTN 02/08/2012  . Acid reflux 02/08/2012    Past Surgical History:  Procedure Laterality Date  . ABDOMINAL HYSTERECTOMY    . BLADDER SURGERY    . cataract surgery    . ENDOVENOUS ABLATION SAPHENOUS VEIN W/ LASER    . NASAL SINUS SURGERY    . TOE AMPUTATION    . TONSILLECTOMY      Prior to Admission medications   Medication Sig Start Date End Date Taking? Authorizing Provider  amiodarone (PACERONE) 200 MG tablet TAKE 1 TABLET (200 MG TOTAL) BY MOUTH ONCE DAILY. 12/16/15   [provider]  amitriptyline (ELAVIL) 50 MG tablet TAKE 1 TABLET (50 MG TOTAL) BY MOUTH NIGHTLY. 03/31/16   [provider]  aspirin EC 81 MG tablet Take 81 mg by mouth daily.    [provider]  atorvastatin (LIPITOR) 40 MG tablet Take 40 mg by mouth daily.    [provider]  cephALEXin (KEFLEX) 500 MG capsule Take 1 capsule (500 mg total) by mouth 3 (three) times daily. 04/29/18   Loleta Rose, MD  doxycycline (VIBRAMYCIN) 100 MG capsule Take 1 capsule (100 mg) by mouth twice daily for 10  days. 04/29/18   Loleta Rose, MD  glucose blood test strip 1 each 2 (two) times daily. Reported on 12/18/2015 05/22/15   [provider]  metoprolol (LOPRESSOR) 50 MG tablet Take 1 tablet (50 mg total) by mouth 2 (two) times daily. 05/23/12   Osvaldo Shipper, MD  nitrofurantoin (MACRODANTIN) 100 MG capsule Take 100 mg by mouth at bedtime.    [provider]  nitrofurantoin, macrocrystal-monohydrate, (MACROBID) 100 MG capsule Take 1 capsule (100 mg total) by mouth every 12 (twelve) hours. 03/18/18   McGowan, Carollee Herter A, PA-C  NOVOLOG MIX 70/30 FLEXPEN (70-30) 100 UNIT/ML FlexPen Inject 0.12 mLs (12 Units total) into the skin 2 (two) times daily with a meal. 11/30/17   Mody, Sital, MD  pantoprazole (PROTONIX) 40 MG tablet Take 40 mg by mouth daily.    [provider]  PHARMACIST CHOICE LANCETS MISC USE 2 TIMES DAILY FOR DIABETIC TESTING 05/22/15   [provider]  tamsulosin (FLOMAX) 0.4 MG CAPS capsule Take 0.4 mg by mouth daily. 30 minutes before the same meal 03/19/16  [provider]    Allergies Ciprofloxacin; Sulfamethoxazole-trimethoprim; Augmentin [amoxicillin-pot clavulanate]; Levofloxacin; Losartan potassium-hctz; Nitrofurantoin; Sulfa antibiotics; Sulfasalazine; and Levaquin [levofloxacin in d5w]  Family History  Problem Relation Age of Onset  . Diabetes Mellitus II Mother   . Heart disease Mother   . Lung cancer Father   . Diabetes Paternal Grandmother   . Diabetes Maternal Grandmother   . Kidney disease Neg Hx   . Prostate cancer Neg Hx   . Bladder Cancer Neg Hx     Social History Social History   Tobacco Use  . Smoking status: Never Smoker  . Smokeless tobacco: Never Used  Substance Use Topics  . Alcohol use: No    Alcohol/week: 0.0 oz  . Drug use: No    Review of Systems Constitutional: Fever at home, now resolved Eyes: No visual changes. ENT: No sore throat. Cardiovascular: Denies chest pain. Respiratory: Nonproductive but  thick sounding cough for about a week.  Denies shortness of breath. Gastrointestinal: No abdominal pain.  No nausea, no vomiting.  No diarrhea.  No constipation. Genitourinary: Chronic suprapubic catheter Musculoskeletal: Negative for neck pain.  Negative for back pain. Integumentary: Negative for rash. Neurological: Negative for headaches, focal weakness or numbness.   ____________________________________________   PHYSICAL EXAM:  VITAL SIGNS: ED Triage Vitals  Enc Vitals Group     BP 04/29/18 0130 (!) 120/95     Pulse Rate 04/29/18 0130 87     Resp 04/29/18 0130 20     Temp 04/29/18 0130 99 F (37.2 C)     Temp Source 04/29/18 0130 Oral     SpO2 04/29/18 0125 93 %     Weight 04/29/18 0131 73.9 kg (163 lb)     Height 04/29/18 0131 1.651 m (5\' 5" )     Head Circumference --      Peak Flow --      Pain Score 04/29/18 0131 0     Pain Loc --      Pain Edu? --      Excl. in GC? --     Constitutional: Alert and oriented.  Appears chronically ill but in no acute distress Eyes: Conjunctivae are normal.  Head: Atraumatic. Nose: No congestion/rhinnorhea. Mouth/Throat: Mucous membranes are moist. Neck: No stridor.  No meningeal signs.   Cardiovascular: Normal rate, regular rhythm. Good peripheral circulation. Grossly normal heart sounds. Respiratory: Normal respiratory effort.  No retractions. Lungs CTAB. Gastrointestinal: Soft and nontender. No distention.  Genitourinary: Chronic suprapubic catheter Musculoskeletal: No lower extremity tenderness nor edema. No gross deformities of extremities. Neurologic:  Normal speech and language. No gross focal neurologic deficits are appreciated.  Skin:  Skin is warm, dry and intact. No rash noted. Psychiatric: Mood and affect are normal. Speech and behavior are normal.  ____________________________________________   LABS (all labs ordered are listed, but only abnormal results are displayed)  Labs Reviewed  COMPREHENSIVE METABOLIC  PANEL - Abnormal; Notable for the following components:      Result Value   Glucose, Bld 226 (*)    BUN 29 (*)    Creatinine, Ser 1.24 (*)    Albumin 3.0 (*)    GFR calc non Af Amer 39 (*)    GFR calc Af Amer 46 (*)    All other components within normal limits  BRAIN NATRIURETIC PEPTIDE - Abnormal; Notable for the following components:   B Natriuretic Peptide 246.0 (*)    All other components within normal limits  CBC WITH DIFFERENTIAL/PLATELET - Abnormal; Notable for the  following components:   RBC 3.40 (*)    Hemoglobin 10.8 (*)    HCT 32.2 (*)    RDW 14.9 (*)    Monocytes Absolute 1.1 (*)    All other components within normal limits  URINALYSIS, ROUTINE W REFLEX MICROSCOPIC - Abnormal; Notable for the following components:   Color, Urine YELLOW (*)    APPearance TURBID (*)    Protein, ur 100 (*)    Leukocytes, UA MODERATE (*)    WBC, UA >50 (*)    Bacteria, UA MANY (*)    All other components within normal limits  CULTURE, BLOOD (ROUTINE X 2)  CULTURE, BLOOD (ROUTINE X 2)  URINE CULTURE  LACTIC ACID, PLASMA  TROPONIN I  PROCALCITONIN   ____________________________________________  EKG  ED ECG REPORT I, Loleta Rose, the attending physician, personally viewed and interpreted this ECG.  Date: 04/29/2018 EKG Time: 2:20 AM Rate: 83 Rhythm: Sinus rhythm with first-degree AV block QRS Axis: normal Intervals: Left bundle branch block (pre-existing) and PR interval of 218 ms ST/T Wave abnormalities: Non-specific ST segment / T-wave changes, but no evidence of acute ischemia. Narrative Interpretation: no evidence of acute ischemia   ____________________________________________  RADIOLOGY I, Loleta Rose, personally viewed and evaluated these images (plain radiographs) as part of my medical decision making, as well as reviewing the written report by the radiologist.  ED MD interpretation: Possible mild pneumonia  Official radiology report(s): Dg Chest 2  View  Result Date: 04/29/2018 CLINICAL DATA:  Acute onset of cough and congestion. Cloudy urine. Fever. EXAM: CHEST - 2 VIEW COMPARISON:  Chest radiograph performed 07/07/2013 FINDINGS: The lungs are well-aerated. Peribronchial thickening is noted. Mild bibasilar opacities may reflect mild pneumonia. There is no evidence of pleural effusion or pneumothorax. The heart is borderline normal in size. No acute osseous abnormalities are seen. IMPRESSION: Mild bibasilar airspace opacities may reflect mild pneumonia. Electronically Signed   By: Roanna Raider M.D.   On: 04/29/2018 02:08    ____________________________________________   PROCEDURES  Critical Care performed: No   Procedure(s) performed:   Procedures   ____________________________________________   INITIAL IMPRESSION / ASSESSMENT AND PLAN / ED COURSE  As part of my medical decision making, I reviewed the following data within the electronic MEDICAL RECORD NUMBER Nursing notes reviewed and incorporated, Labs reviewed , EKG interpreted , Old EKG reviewed, Old chart reviewed, Radiograph reviewed  and Notes from prior ED visits    Differential diagnosis includes, but is not limited to, pneumonia, urinary tract infection, sepsis, viral illness, bronchitis.  The patient has numerous chronic medical conditions but is in no distress at this time.  She was reportedly febrile at home and she is able to give a good history, but she has been afebrile in the emergency department.  Her vital signs are within normal limits.  She does not meet sepsis criteria.  Her chest x-ray is suggestive of mild pneumonia but is not clear-cut or obvious, and I will await the results of her lab work to determine if she requires inpatient treatment or if she would be appropriate for outpatient antibiotics.  She is amenable to either plan when I discussed my recommendations for work-up and possible disposition.  She is in no acute distress at this time.  While she was  asleep her oxygen level was 90% but she is in no distress.   Clinical Course as of Apr 29 432  Fri Apr 29, 2018  0300 Lactic Acid, Venous: 1.0 [CF]  0317 WBC:  8.6 [CF]  0318 PORT score is 82, which is generally reassuring.   [CF]  0334 UA consistent with infection, but this may be chronic given chronic suprapubic catheter, urine culture is pending.  Urinalysis, Routine w reflex microscopic (not at [facility])(!) [CF]  [phone] Patient has been off of oxygen and is fast asleep and satting in the low 90s and no respiratory distress.  As documented above, she has generally reassuring PORT score, and I believe that admission for IV antibiotics is not indicated and in fact could lead to nosocomial infections and worsening overall clinical status including hospital-acquired delirium.  The patient should be able to be treated adequately as an outpatient.  I will prescribe doxycycline for mild community-acquired pneumonia; she lives at an assisted living facility and not a nursing home, and I believe that the doxycycline should provide adequate coverage).  Additionally, however, I will also provide a prescription for Keflex for broader coverage of an apparent urinary tract infection which I do suspect is chronic due to the suprapubic catheter but which I should still treat given her report of fever.  She will need to follow-up as an outpatient.  I gave my usual and customary return precautions.    [CF]    Clinical Course User Index [CF] Loleta Rose, MD    ____________________________________________  FINAL CLINICAL IMPRESSION(S) / ED DIAGNOSES  Final diagnoses:  Community acquired pneumonia, unspecified laterality  Urinary tract infection associated with cystostomy catheter, initial encounter [facility])     MEDICATIONS GIVEN DURING THIS VISIT:  Medications  cefTRIAXone (ROCEPHIN) 1 g in sodium chloride 0.9 % 100 mL IVPB (0 g Intravenous Stopped 04/29/18 0408)  doxycycline (VIBRA-TABS) tablet 100 mg (100  mg Oral Given 04/29/18 0336)     ED Discharge Orders        Ordered    doxycycline (VIBRAMYCIN) 100 MG capsule     04/29/18 0424    cephALEXin (KEFLEX) 500 MG capsule  3 times daily     04/29/18 0424       Note:  This document was prepared using Dragon voice recognition software and may include unintentional dictation errors.    Loleta Rose, MD 04/29/18 [phone]

## 2018-04-29 NOTE — ED Notes (Signed)
Patient transported to X-ray 

## 2018-04-29 NOTE — Discharge Instructions (Addendum)
As we discussed, although it does appear that you may have a very mild pneumonia as well as a urinary tract infection (most likely from your suprapubic catheter), the rest of your lab work and vital signs are all within normal limits and there is no need for you to stay in the hospital at this time.  Please take both antibiotics as prescribed for the full course of treatment.  Follow-up with your regular doctor at the next available opportunity.  Continue taking all of your other medications as prescribed.  Return to the emergency department if you develop new or worsening symptoms that concern you.

## 2018-04-29 NOTE — ED Triage Notes (Signed)
Per EMS, [facility] with reports of cough and congestion last 4 days. Pt also has a chronic foley and urinary output appears cloudy. Pt states she took tylenol at 8pm last night for fever of 102. Pt does have cough on arrival, sounds congested. Pt A&O x4.

## 2018-05-01 NOTE — Progress Notes (Deleted)
05/02/2018 2:36 PM   Pamela [name] October 30, 1934 [id number]  Referring provider: Mick Sell, MD [address] [place], [place] [address]  No chief complaint on file.   HPI: Patient is a 84 year old Caucasian female with recurrent UTI's, mixed incontinence and vaginal atrophy who presents today for a follow up for SPT exchange with her caregiver, Pamela [name].  ***  She underwent a CT guided SPT placement on 02/09/2018.   She was seen in the ED on 02/12/2018 with bleeding and drainage from around the suprapubic catheter.  Bedside ultrasound performed by the ED physician revealed no evidence of subcutaneous fluid.  UA was positive for TNTC RBC's and TNTC WBC's.   WBC count was 5.6.  She was reassured and discharged home.    She presented to the office on 02/15/2018 with the complaint of having some dark urine in the catheter bag.  She is still experiencing redness around the SPT site.  Patient denies any gross hematuria, dysuria or suprapubic/flank pain.  Patient denies any fevers, chills, nausea or vomiting.   She states she is still having some leakage from the urethra.  The tube was found to be on tension and son was advised to keep slack in the tube to prevent erosion of SPT site.    On 03/18/2018, she returned with the complaint of the SPT not draining, urinary incontinence, frequency, urgency and nocturia.  The caregiver states there is a foul odor to her urine and "white stuff" in the tubing.  Patient denies any gross hematuria, dysuria or suprapubic/flank pain.  Patient denies any fevers, chills, nausea or vomiting.   Her UA from the SPT was positive for > 30 WBC's, 11-30 RBC's and many bacteria.    Urinary retention Managed with SPT.    Recurrent UTI Patient was undergoing gentamicin instillations three times weekly in our office.  She was seen on 12/23/2017 by Dr. Sampson Goon and placed on suppressive Macrobid therapy.     Bladder wall thickening A CT of the pelvis was  performed on 04/14/2012 and it noted no hydronephrosis, but irregular thickened bladder wall with debris within the bladder and left renal cysts.  Patient underwent a cystoscopy on 09/27/2014 with Dr. Lonna Cobb and no abnormalities were noted.  Renal ultrasound completed on 09/29/2016 noted that the urinary bladder wall is irregularly thickened with increased vascularity questioning significant inflammatory change versus possible neoplastic infiltration within the urinary bladder. And a left renal cyst which appears slightly larger than previous study.   Patient underwent cystoscopy on 03/16/2017 with Dr. Mena Goes and bladder was irrigated of debris and two small diverticulum where inspected.  No tumors were seen.    Incontinence Managed with SPT.    PMH: Past Medical History:  Diagnosis Date  . A-fib (HCC) 08/28/2014   Overview:  Overview:  Diastolic dysfunction on ECHO 04/2012-done at [facility]   . Abnormal gait 02/08/2012  . Abscess or cellulitis of leg 04/18/2012  . Absence of bladder continence 07/29/2012  . Acid reflux 02/08/2012  . Acute kidney failure (HCC) 05/15/2012  . Afib, paroxysmal, with RVR at times with BBB 05/13/2012  . Allergic drug rash 05/15/2012  . Anemia 05/13/2012  . 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 Benign essential HTN 02/08/2012  . Bladder infection, chronic 06/29/2013  . BP (high blood pressure) 05/12/2012  . Bursitis of knee 08/07/2014  . Cellulitis in diabetic foot (HCC) 05/12/2012  . CHF (congestive heart failure) (HCC)   . Chronic kidney disease 06/01/2012  Overview:  Overview:   12/2012- Renal US, simple L cyst. UIEP normal   . Clinical depression 08/28/2014  . CN (constipation) 03/17/2012  . Dermatitis due to drug reaction 05/15/2012  . Diabetes mellitus   . Diastolic dysfunction, left ventricle 05/14/2012  . DM (diabetes mellitus) (HCC) 05/12/2012  . Dysrhythmia   . Dysuria   . Edema leg 07/02/2014  . Enthesopathy of knee 08/07/2014  . Excess fluid volume 05/20/2012  . FOM  (frequency of micturition) 07/29/2012  . Frank hematuria 09/27/2014  . Gangrene (HCC)   . GERD (gastroesophageal reflux disease)   . Heart disease   . High cholesterol   . HTN (hypertension) 05/12/2012  . Hypercholesterolemia 05/12/2012  . Hypertension   . Incomplete bladder emptying 07/29/2012  . Left bundle branch block (LBBB)   . Open wound of knee, leg (except thigh), and ankle 06/01/2012  . 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   Overview:  Overview:  Several years ago, allergic reaction to sulfa antibiotic Several years ago, allergic reaction to sulfa antibiotic     Surgical History: Past Surgical History:  Procedure Laterality Date  . ABDOMINAL HYSTERECTOMY    . BLADDER SURGERY    . cataract surgery    . ENDOVENOUS ABLATION SAPHENOUS VEIN W/ LASER    . NASAL SINUS SURGERY    . TOE AMPUTATION    . TONSILLECTOMY      Home Medications:  Allergies as of 05/02/2018      Reactions   Ciprofloxacin Anaphylaxis   Sulfamethoxazole-trimethoprim Other (See Comments), Rash   Went into [employer].   Augmentin [amoxicillin-pot Clavulanate] Hives   Levofloxacin Other (See Comments)   Losartan Potassium-hctz Other (See Comments)   Other reaction(s): Unknown   Nitrofurantoin Other (See Comments)   Sulfa Antibiotics Other (See Comments)   Levonne Spiller Steven's Johnsons   Sulfasalazine    unknown   Levaquin [levofloxacin In D5w]       Medication List        Accurate as of 05/01/18  2:36 PM. Always use your most recent med list.          amiodarone 200 MG tablet Commonly known as:  PACERONE TAKE 1 TABLET (200 MG TOTAL) BY MOUTH ONCE DAILY.   amitriptyline 50 MG tablet Commonly known as:  ELAVIL TAKE 1 TABLET (50 MG TOTAL) BY MOUTH NIGHTLY.   aspirin EC 81 MG tablet Take 81 mg by mouth daily.   atorvastatin 40 MG tablet Commonly known as:  LIPITOR Take 40 mg by mouth daily.   cephALEXin 500 MG capsule Commonly known as:  KEFLEX Take 1 capsule (500 mg  total) by mouth 3 (three) times daily.   doxycycline 100 MG capsule Commonly known as:  VIBRAMYCIN Take 1 capsule (100 mg) by mouth twice daily for 10 days.   glucose blood test strip 1 each 2 (two) times daily. Reported on 12/18/2015   metoprolol tartrate 50 MG tablet Commonly known as:  LOPRESSOR Take 1 tablet (50 mg total) by mouth 2 (two) times daily.   nitrofurantoin (macrocrystal-monohydrate) 100 MG capsule Commonly known as:  MACROBID Take 1 capsule (100 mg total) by mouth every 12 (twelve) hours.   nitrofurantoin 100 MG capsule Commonly known as:  MACRODANTIN Take 100 mg by mouth at bedtime.   NOVOLOG MIX 70/30 FLEXPEN (70-30) 100 UNIT/ML FlexPen Generic drug:  insulin aspart protamine - aspart Inject 0.12 mLs (12 Units total) into the skin 2 (two) times daily with a meal.   pantoprazole 40  MG tablet Commonly known as:  PROTONIX Take 40 mg by mouth daily.   PHARMACIST CHOICE LANCETS Misc USE 2 TIMES DAILY FOR DIABETIC TESTING   tamsulosin 0.4 MG Caps capsule Commonly known as:  FLOMAX Take 0.4 mg by mouth daily. 30 minutes before the same meal       Allergies:  Allergies  Allergen Reactions  . Ciprofloxacin Anaphylaxis  . Sulfamethoxazole-Trimethoprim Other (See Comments) and Rash    Went into [employer].  . Augmentin [Amoxicillin-Pot Clavulanate] Hives  . Levofloxacin Other (See Comments)  . Losartan Potassium-Hctz Other (See Comments)    Other reaction(s): Unknown  . Nitrofurantoin Other (See Comments)  . Sulfa Antibiotics Other (See Comments)    Levonne Spiller Steven's Johnsons  . Sulfasalazine     unknown  . Levaquin [Levofloxacin In D5w]     Family History: Family History  Problem Relation Age of Onset  . Diabetes Mellitus II Mother   . Heart disease Mother   . Lung cancer Father   . Diabetes Paternal Grandmother   . Diabetes Maternal Grandmother   . Kidney disease Neg Hx   . Prostate cancer Neg Hx   . Bladder Cancer Neg Hx      Social History:  reports that she has never smoked. She has never used smokeless tobacco. She reports that she does not drink alcohol or use drugs.  ROS:                                        Physical Exam: There were no vitals taken for this visit.  Constitutional: Well nourished. Alert and oriented, No acute distress. HEENT: [place] AT, moist mucus membranes. Trachea midline, no masses. Cardiovascular: No clubbing, cyanosis, or edema. Respiratory: Normal respiratory effort, no increased work of breathing. GI: Abdomen is soft, non tender, non distended, no abdominal masses. Liver and spleen not palpable.  No hernias appreciated.  Stool sample for occult testing is not indicated.  SPT site is clean and dry. There is an area of firmness at the insertion site, but there is no erythema, fluctuance, crepitus or tenderness when palpating the area. GU: No CVA tenderness.  No bladder fullness or masses.   Skin: No rashes, bruises or suspicious lesions. Lymph: No cervical or inguinal adenopathy. Neurologic: Grossly intact, no focal deficits, moving all 4 extremities. Psychiatric: Normal mood and affect.  Constitutional: Well nourished. Alert and oriented, No acute distress. HEENT: [place] AT, moist mucus membranes. Trachea midline, no masses. Cardiovascular: No clubbing, cyanosis, or edema. Respiratory: Normal respiratory effort, no increased work of breathing. GI: Abdomen is soft, non tender, non distended, no abdominal masses. Liver and spleen not palpable.  No hernias appreciated.  Stool sample for occult testing is not indicated.   GU: No CVA tenderness.  No bladder fullness or masses.   Skin: No rashes, bruises or suspicious lesions. Lymph: No cervical or inguinal adenopathy. Neurologic: Grossly intact, no focal deficits, moving all 4 extremities. Psychiatric: Normal mood and affect.   Laboratory Data See above  I have reviewed the labs.  Assessment & Plan:      1. Urinary retention:    Patient's SPT exchanged to a regular 74f Foley catheter -she continues to have the SPT tubing entangled in her legs and stepping on the tubing and therefore traumatically removing the catheter, she has done this with both the SPT and with an indwelling Foley  2. History of recurrent UTI's Recommend discontinuing the Macrobid suppression as recent cultures have demonstrate resistance to the antibiotic Will prescribe Hiprex at this time to see if we can reduce her recurrent UTI's   3. Vaginal atrophy  - estrogen ring removed  4. Cystocele  - pessary removed                                      No follow-ups on file.  These notes generated with voice recognition software. I apologize for typographical errors.  Michiel CowboySHANNON Bartow Zylstra, PA-C  [facility] [address] [place], [place] [id number] [phone]

## 2018-05-02 ENCOUNTER — Encounter: Payer: Self-pay | Admitting: Urology

## 2018-05-02 ENCOUNTER — Ambulatory Visit: Payer: Medicare Other | Admitting: Urology

## 2018-05-02 LAB — URINE CULTURE

## 2018-05-03 ENCOUNTER — Ambulatory Visit: Payer: Medicare Other | Admitting: Urology

## 2018-05-04 LAB — CULTURE, BLOOD (ROUTINE X 2)
CULTURE: NO GROWTH
CULTURE: NO GROWTH
SPECIAL REQUESTS: ADEQUATE
Special Requests: ADEQUATE

## 2018-05-11 ENCOUNTER — Ambulatory Visit: Payer: Medicare Other | Admitting: Urology

## 2018-05-25 NOTE — Progress Notes (Signed)
05/26/2018 4:11 PM   Pamela [name] 11-14-1934 [id number]  Referring provider: Mick Sell, MD [address] [place], [place] [address]  Chief Complaint  Patient presents with  . Urinary Retention    HPI: Patient is a 84 year old Caucasian female with recurrent UTI's, mixed incontinence and vaginal atrophy who presents today for a follow up for SPT exchange with her son, Loraine Leriche.    She underwent a CT guided SPT placement on 02/09/2018.   She was seen in the ED on 02/12/2018 with bleeding and drainage from around the suprapubic catheter.  Bedside ultrasound performed by the ED physician revealed no evidence of subcutaneous fluid.  UA was positive for TNTC RBC's and TNTC WBC's.   WBC count was 5.6.  She was reassured and discharged home.    She presented to the office on 02/15/2018 with the complaint of having some dark urine in the catheter bag.  She is still experiencing redness around the SPT site.  Patient denies any gross hematuria, dysuria or suprapubic/flank pain.  Patient denies any fevers, chills, nausea or vomiting.   She states she is still having some leakage from the urethra.  The tube was found to be on tension and son was advised to keep slack in the tube to prevent erosion of SPT site.    On 03/18/2018, she returned with the complaint of the SPT not draining, urinary incontinence, frequency, urgency and nocturia.  The caregiver states there is a foul odor to her urine and "white stuff" in the tubing.  Patient denies any gross hematuria, dysuria or suprapubic/flank pain.  Patient denies any fevers, chills, nausea or vomiting.   Her UA from the SPT was positive for > 30 WBC's, 11-30 RBC's and many bacteria.    Underwent final dilation on 03/28/2018 with IR for SPT placement.  She is here today to change to a regular Foley.  She has pulled the tube out twice.  She currently has a Foley that has been in place for 6 weeks.    Urinary retention Managed with SPT.     Recurrent UTI Patient was undergoing gentamicin instillations three times weekly in our office.  She was seen on 12/23/2017 by Dr. Sampson Goon and placed on suppressive Macrobid therapy.     Bladder wall thickening A CT of the pelvis was performed on 04/14/2012 and it noted no hydronephrosis, but irregular thickened bladder wall with debris within the bladder and left renal cysts.  Patient underwent a cystoscopy on 09/27/2014 with Dr. Lonna Cobb and no abnormalities were noted.  Renal ultrasound completed on 09/29/2016 noted that the urinary bladder wall is irregularly thickened with increased vascularity questioning significant inflammatory change versus possible neoplastic infiltration within the urinary bladder. And a left renal cyst which appears slightly larger than previous study.   Patient underwent cystoscopy on 03/16/2017 with Dr. Mena Goes and bladder was irrigated of debris and two small diverticulum where inspected.  No tumors were seen.    Incontinence Managed with SPT.    PMH: Past Medical History:  Diagnosis Date  . A-fib (HCC) 08/28/2014   Overview:  Overview:  Diastolic dysfunction on ECHO 04/2012-done at [facility]   . Abnormal gait 02/08/2012  . Abscess or cellulitis of leg 04/18/2012  . Absence of bladder continence 07/29/2012  . Acid reflux 02/08/2012  . Acute kidney failure (HCC) 05/15/2012  . Afib, paroxysmal, with RVR at times with BBB 05/13/2012  . Allergic drug rash 05/15/2012  . Anemia 05/13/2012  .  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 Benign essential HTN 02/08/2012  . Bladder infection, chronic 06/29/2013  . BP (high blood pressure) 05/12/2012  . Bursitis of knee 08/07/2014  . Cellulitis in diabetic foot (HCC) 05/12/2012  . CHF (congestive heart failure) (HCC)   . Chronic kidney disease 06/01/2012   Overview:  Overview:   12/2012- Renal US, simple L cyst. UIEP normal   . Clinical depression 08/28/2014  . CN (constipation) 03/17/2012  . Dermatitis due to drug reaction 05/15/2012  .  Diabetes mellitus   . Diastolic dysfunction, left ventricle 05/14/2012  . DM (diabetes mellitus) (HCC) 05/12/2012  . Dysrhythmia   . Dysuria   . Edema leg 07/02/2014  . Enthesopathy of knee 08/07/2014  . Excess fluid volume 05/20/2012  . FOM (frequency of micturition) 07/29/2012  . Frank hematuria 09/27/2014  . Gangrene (HCC)   . GERD (gastroesophageal reflux disease)   . Heart disease   . High cholesterol   . HTN (hypertension) 05/12/2012  . Hypercholesterolemia 05/12/2012  . Hypertension   . Incomplete bladder emptying 07/29/2012  . Left bundle branch block (LBBB)   . Open wound of knee, leg (except thigh), and ankle 06/01/2012  . 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   Overview:  Overview:  Several years ago, allergic reaction to sulfa antibiotic Several years ago, allergic reaction to sulfa antibiotic     Surgical History: Past Surgical History:  Procedure Laterality Date  . ABDOMINAL HYSTERECTOMY    . BLADDER SURGERY    . cataract surgery    . ENDOVENOUS ABLATION SAPHENOUS VEIN W/ LASER    . NASAL SINUS SURGERY    . TOE AMPUTATION    . TONSILLECTOMY      Home Medications:  Allergies as of 05/26/2018      Reactions   Ciprofloxacin Anaphylaxis   Sulfamethoxazole-trimethoprim Other (See Comments), Rash   Went into [employer].   Augmentin [amoxicillin-pot Clavulanate] Hives   Levofloxacin Other (See Comments)   Losartan Potassium-hctz Other (See Comments)   Other reaction(s): Unknown   Nitrofurantoin Other (See Comments)   Sulfa Antibiotics Other (See Comments)   Levonne Spiller Steven's Johnsons   Sulfasalazine    unknown   Levaquin [levofloxacin In D5w]       Medication List        Accurate as of 05/26/18  4:11 PM. Always use your most recent med list.          amiodarone 200 MG tablet Commonly known as:  PACERONE TAKE 1 TABLET (200 MG TOTAL) BY MOUTH ONCE DAILY.   amitriptyline 50 MG tablet Commonly known as:  ELAVIL TAKE 1 TABLET (50 MG TOTAL)  BY MOUTH NIGHTLY.   aspirin EC 81 MG tablet Take 81 mg by mouth daily.   atorvastatin 40 MG tablet Commonly known as:  LIPITOR Take 40 mg by mouth daily.   glucose blood test strip 1 each 2 (two) times daily. Reported on 12/18/2015   metoprolol tartrate 50 MG tablet Commonly known as:  LOPRESSOR Take 1 tablet (50 mg total) by mouth 2 (two) times daily.   nitrofurantoin (macrocrystal-monohydrate) 100 MG capsule Commonly known as:  MACROBID Take 1 capsule (100 mg total) by mouth every 12 (twelve) hours.   nitrofurantoin 100 MG capsule Commonly known as:  MACRODANTIN Take 100 mg by mouth at bedtime.   NOVOLOG MIX 70/30 FLEXPEN (70-30) 100 UNIT/ML FlexPen Generic drug:  insulin aspart protamine - aspart Inject 0.12 mLs (12 Units total) into the skin 2 (two) times daily with  a meal.   pantoprazole 40 MG tablet Commonly known as:  PROTONIX Take 40 mg by mouth daily.   PHARMACIST CHOICE LANCETS Misc USE 2 TIMES DAILY FOR DIABETIC TESTING   tamsulosin 0.4 MG Caps capsule Commonly known as:  FLOMAX Take 0.4 mg by mouth daily. 30 minutes before the same meal       Allergies:  Allergies  Allergen Reactions  . Ciprofloxacin Anaphylaxis  . Sulfamethoxazole-Trimethoprim Other (See Comments) and Rash    Went into [employer].  . Augmentin [Amoxicillin-Pot Clavulanate] Hives  . Levofloxacin Other (See Comments)  . Losartan Potassium-Hctz Other (See Comments)    Other reaction(s): Unknown  . Nitrofurantoin Other (See Comments)  . Sulfa Antibiotics Other (See Comments)    Levonne SpillerStevens johnson Steven's Johnsons  . Sulfasalazine     unknown  . Levaquin [Levofloxacin In D5w]     Family History: Family History  Problem Relation Age of Onset  . Diabetes Mellitus II Mother   . Heart disease Mother   . Lung cancer Father   . Diabetes Paternal Grandmother   . Diabetes Maternal Grandmother   . Kidney disease Neg Hx   . Prostate cancer Neg Hx   . Bladder Cancer Neg Hx      Social History:  reports that she has never smoked. She has never used smokeless tobacco. She reports that she does not drink alcohol or use drugs.  ROS: UROLOGY Frequent Urination?: No Hard to postpone urination?: No Burning/pain with urination?: No Get up at night to urinate?: No Leakage of urine?: No Urine stream starts and stops?: No Trouble starting stream?: No Do you have to strain to urinate?: No Blood in urine?: No Urinary tract infection?: No Sexually transmitted disease?: No Injury to kidneys or bladder?: No Painful intercourse?: No Weak stream?: No Currently pregnant?: No Vaginal bleeding?: No Last menstrual period?: n  Gastrointestinal Nausea?: No Vomiting?: No Indigestion/heartburn?: No Diarrhea?: No Constipation?: No  Constitutional Fever: No Night sweats?: No Weight loss?: No Fatigue?: No  Skin Skin rash/lesions?: No Itching?: No  Eyes Blurred vision?: No Double vision?: No  Ears/Nose/Throat Sore throat?: No Sinus problems?: No  Hematologic/Lymphatic Swollen glands?: No Easy bruising?: No  Cardiovascular Leg swelling?: No Chest pain?: No  Respiratory Cough?: No Shortness of breath?: No  Endocrine Excessive thirst?: No  Musculoskeletal Back pain?: No Joint pain?: No  Neurological Headaches?: No Dizziness?: No  Psychologic Depression?: No Anxiety?: No  Physical Exam: BP (!) 146/83   Pulse (!) 53   Temp (!) 97.4 F (36.3 C) (Oral)   Resp 16   Wt 156 lb 4.8 oz (70.9 kg)   SpO2 97%   BMI 26.01 kg/m   Constitutional: Well nourished. Alert and oriented, No acute distress. HEENT: [place] AT, moist mucus membranes. Trachea midline, no masses. Cardiovascular: No clubbing, cyanosis, or edema. Respiratory: Normal respiratory effort, no increased work of breathing. GI: Abdomen is soft, non tender, non distended, no abdominal masses. Liver and spleen not palpable.  No hernias appreciated.  Stool sample for occult testing is  not indicated.   GU: No CVA tenderness.  No bladder fullness or masses.   Skin: No rashes, bruises or suspicious lesions. Lymph: No cervical or inguinal adenopathy. Neurologic: Grossly intact, no focal deficits, moving all 4 extremities. Psychiatric: Normal mood and affect.   Laboratory Data See above  I have reviewed the labs.  Assessment & Plan:    1. Urinary retention:    After discussion with the patient and her son,  we have decided to continue with the Foley catheter and forego placing a SPT.  We will keep the Foley attached to a leg bag so that she will have less of a chance of tripping over the tubing and pulling it out.  She has caretakers come to her apartment three times daily and then can empty the bag.  She will RTC in one month for Foley exchange.    2. History of recurrent UTI's Recommend discontinuing the Macrobid suppression as recent cultures have demonstrate resistance to the antibiotic Will prescribe Hiprex at this time to see if we can reduce her recurrent UTI's   3. Vaginal atrophy  - estrogen ring removed  4. Cystocele  - pessary removed                                      Return in about 1 month (around 06/25/2018) for Foley exchange .  These notes generated with voice recognition software. I apologize for typographical errors.  Michiel Cowboy, PA-C  [facility] [address] [place], [place] [address] [phone]

## 2018-05-26 ENCOUNTER — Encounter: Payer: Self-pay | Admitting: Urology

## 2018-05-26 ENCOUNTER — Ambulatory Visit (INDEPENDENT_AMBULATORY_CARE_PROVIDER_SITE_OTHER): Payer: Medicare Other | Admitting: Urology

## 2018-05-26 VITALS — BP 146/83 | HR 53 | Temp 97.4°F | Resp 16 | Wt 156.3 lb

## 2018-05-26 DIAGNOSIS — R339 Retention of urine, unspecified: Secondary | ICD-10-CM

## 2018-05-26 DIAGNOSIS — Z87440 Personal history of urinary (tract) infections: Secondary | ICD-10-CM

## 2018-06-29 ENCOUNTER — Other Ambulatory Visit: Payer: Self-pay

## 2018-06-29 ENCOUNTER — Ambulatory Visit (INDEPENDENT_AMBULATORY_CARE_PROVIDER_SITE_OTHER): Payer: Medicare Other

## 2018-06-29 DIAGNOSIS — R339 Retention of urine, unspecified: Secondary | ICD-10-CM

## 2018-06-29 NOTE — Progress Notes (Signed)
Cath Change/ Replacement  Patient is present today for a catheter change due to urinary retention.  10ml of water was removed from the balloon, a 16FR foley cath was removed with out difficulty.  Patient was cleaned and prepped in a sterile fashion with betadine and 2% lidocaine jelly was instilled into the urethra. A 16 FR foley cath was replaced into the bladder no complications were noted Urine return was noted 10ml and urine was yellow in color. The balloon was filled with 10ml of sterile water. A leg bag was attached for drainage.  A night bag was also given to the patient and patient was given instruction on how to change from one bag to another. Patient was given proper instruction on catheter care.    Preformed by: Nydia Boutonhristopher Thompson, CMA and Luther Hearingkie Glanton, CMA  Follow up: As Scheduled

## 2018-07-29 ENCOUNTER — Telehealth: Payer: Self-pay | Admitting: Urology

## 2018-07-29 NOTE — Telephone Encounter (Signed)
Pt's caregiver wants to come by and get a couple of leg bags.

## 2018-08-03 ENCOUNTER — Ambulatory Visit (INDEPENDENT_AMBULATORY_CARE_PROVIDER_SITE_OTHER): Payer: Medicare Other

## 2018-08-03 DIAGNOSIS — N3942 Incontinence without sensory awareness: Secondary | ICD-10-CM | POA: Diagnosis not present

## 2018-08-03 NOTE — Progress Notes (Signed)
Cath Change/ Replacement  Patient is present today for a catheter change due to urinary retention.  61ml of water was removed from the balloon, a 16FR foley cath was removed with out difficulty.  Patient was cleaned and prepped in a sterile fashion with betadine and 2% lidocaine jelly was instilled into the urethra. A 16 FR foley cath was replaced into the bladder no complications were noted Urine return was noted 15ml and urine was yellow in color. The balloon was filled with 53ml of sterile water. A leg was attached for drainage.  A night bag was also given to the patient and patient was given instruction on how to change from one bag to another. Patient was given proper instruction on catheter care.    Preformed by: Eligha Bridegroom, CMA  Follow up: 1 month

## 2018-09-07 ENCOUNTER — Ambulatory Visit: Payer: Medicare Other

## 2018-09-08 ENCOUNTER — Ambulatory Visit (INDEPENDENT_AMBULATORY_CARE_PROVIDER_SITE_OTHER): Payer: Medicare Other | Admitting: Family Medicine

## 2018-09-08 VITALS — BP 133/64 | HR 63 | Ht 65.0 in | Wt 178.8 lb

## 2018-09-08 DIAGNOSIS — N3942 Incontinence without sensory awareness: Secondary | ICD-10-CM | POA: Diagnosis not present

## 2018-09-08 NOTE — Progress Notes (Signed)
Cath Change/ Replacement  Patient is present today for a catheter change due to urinary retention.  10ml of water was removed from the balloon, a 16FR foley cath was removed with out difficulty.  Patient was cleaned and prepped in a sterile fashion with betadine and 2% lidocaine jelly was instilled into the urethra. A 16 FR foley cath was replaced into the bladder no complications were noted Urine return was noted 10ml and urine was yellow in color. The balloon was filled with 10ml of sterile water. A night bag was attached for drainage. Patient was given proper instruction on catheter care.    Preformed by: Carrie Garrison, CMA  Follow up: 1 month 

## 2018-10-06 ENCOUNTER — Telehealth: Payer: Self-pay | Admitting: Urology

## 2018-10-06 NOTE — Telephone Encounter (Signed)
Cordelia Pen from [facility] [phone]) called the office to report that patient has arranged for home health to come to her home to manage her monthly cath exchanges.  I advised her to contact our office if she had any questions or concerns.

## 2018-10-13 ENCOUNTER — Ambulatory Visit: Payer: Medicare Other

## 2018-11-25 ENCOUNTER — Emergency Department: Payer: Medicare Other

## 2018-11-25 ENCOUNTER — Encounter: Payer: Self-pay | Admitting: Emergency Medicine

## 2018-11-25 ENCOUNTER — Other Ambulatory Visit: Payer: Self-pay

## 2018-11-25 ENCOUNTER — Emergency Department
Admission: EM | Admit: 2018-11-25 | Discharge: 2018-11-25 | Disposition: A | Discharge status: Home / Self Care | Payer: Medicare Other | Attending: Emergency Medicine | Admitting: Emergency Medicine

## 2018-11-25 DIAGNOSIS — N189 Chronic kidney disease, unspecified: Secondary | ICD-10-CM | POA: Diagnosis not present

## 2018-11-25 DIAGNOSIS — Z79899 Other long term (current) drug therapy: Secondary | ICD-10-CM | POA: Insufficient documentation

## 2018-11-25 DIAGNOSIS — Y999 Unspecified external cause status: Secondary | ICD-10-CM | POA: Diagnosis not present

## 2018-11-25 DIAGNOSIS — Y92122 Bedroom in nursing home as the place of occurrence of the external cause: Secondary | ICD-10-CM | POA: Diagnosis not present

## 2018-11-25 DIAGNOSIS — W19XXXA Unspecified fall, initial encounter: Secondary | ICD-10-CM

## 2018-11-25 DIAGNOSIS — Z7982 Long term (current) use of aspirin: Secondary | ICD-10-CM | POA: Diagnosis not present

## 2018-11-25 DIAGNOSIS — I130 Hypertensive heart and chronic kidney disease with heart failure and stage 1 through stage 4 chronic kidney disease, or unspecified chronic kidney disease: Secondary | ICD-10-CM | POA: Insufficient documentation

## 2018-11-25 DIAGNOSIS — Z794 Long term (current) use of insulin: Secondary | ICD-10-CM | POA: Diagnosis not present

## 2018-11-25 DIAGNOSIS — S0003XA Contusion of scalp, initial encounter: Secondary | ICD-10-CM | POA: Insufficient documentation

## 2018-11-25 DIAGNOSIS — W010XXA Fall on same level from slipping, tripping and stumbling without subsequent striking against object, initial encounter: Secondary | ICD-10-CM | POA: Insufficient documentation

## 2018-11-25 DIAGNOSIS — S0990XA Unspecified injury of head, initial encounter: Secondary | ICD-10-CM | POA: Diagnosis present

## 2018-11-25 DIAGNOSIS — I509 Heart failure, unspecified: Secondary | ICD-10-CM | POA: Diagnosis not present

## 2018-11-25 DIAGNOSIS — Y93F2 Activity, caregiving, lifting: Secondary | ICD-10-CM | POA: Diagnosis not present

## 2018-11-25 DIAGNOSIS — E1122 Type 2 diabetes mellitus with diabetic chronic kidney disease: Secondary | ICD-10-CM | POA: Insufficient documentation

## 2018-11-25 LAB — GLUCOSE, CAPILLARY: Glucose-Capillary: 170 mg/dL — ABNORMAL HIGH (ref 70–99)

## 2018-11-25 MED ORDER — ACETAMINOPHEN 500 MG PO TABS
1000.0000 mg | ORAL_TABLET | Freq: Once | ORAL | Status: AC
  Administered 2018-11-25: 1000 mg via ORAL
  Filled 2018-11-25: qty 2

## 2018-11-25 NOTE — ED Notes (Signed)
Report called to [facility]. State they do not have transportation available this week. ED secretary informed and contacting [facility].

## 2018-11-25 NOTE — ED Provider Notes (Signed)
[facility] Emergency Department Provider Note  ____________________________________________  Time seen: Approximately 11:36 AM  I have reviewed the triage vital signs and the nursing notes.   HISTORY  Chief Complaint Fall   HPI Pamela [name] is a 84 y.o. female with several chronic medical problems as listed below who presents for evaluation of mechanical fall.  Patient was trying to assist her husband getting up from the bed when she lost her balance and fell backwards.  She hit her head on the floor.  No LOC.  She is not on blood thinners.  She reports mild discomfort located in the back of her head since the fall but no severe pain.  She denies changes in vision, neck pain or back pain.  No extremity pain.  Patient denies any syncopal prodrome leading to the fall and reports that the fall was mechanical in nature.  Past Medical History:  Diagnosis Date  . A-fib (HCC) 08/28/2014   Overview:  Overview:  Diastolic dysfunction on ECHO 04/2012-done at [facility]   . Abnormal gait 02/08/2012  . Abscess or cellulitis of leg 04/18/2012  . Absence of bladder continence 07/29/2012  . Acid reflux 02/08/2012  . Acute kidney failure (HCC) 05/15/2012  . Afib, paroxysmal, with RVR at times with BBB 05/13/2012  . Allergic drug rash 05/15/2012  . Anemia 05/13/2012  . 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 Benign essential HTN 02/08/2012  . Bladder infection, chronic 06/29/2013  . BP (high blood pressure) 05/12/2012  . Bursitis of knee 08/07/2014  . Cellulitis in diabetic foot (HCC) 05/12/2012  . CHF (congestive heart failure) (HCC)   . Chronic kidney disease 06/01/2012   Overview:  Overview:   12/2012- Renal US, simple L cyst. UIEP normal   . Clinical depression 08/28/2014  . CN (constipation) 03/17/2012  . Dermatitis due to drug reaction 05/15/2012  . Diabetes mellitus   . Diastolic dysfunction, left ventricle 05/14/2012  . DM (diabetes mellitus) (HCC) 05/12/2012  . Dysrhythmia   .  Dysuria   . Edema leg 07/02/2014  . Enthesopathy of knee 08/07/2014  . Excess fluid volume 05/20/2012  . FOM (frequency of micturition) 07/29/2012  . Frank hematuria 09/27/2014  . Gangrene (HCC)   . GERD (gastroesophageal reflux disease)   . Heart disease   . High cholesterol   . HTN (hypertension) 05/12/2012  . Hypercholesterolemia 05/12/2012  . Hypertension   . Incomplete bladder emptying 07/29/2012  . Left bundle branch block (LBBB)   . Open wound of knee, leg (except thigh), and ankle 06/01/2012  . 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   Overview:  Overview:  Several years ago, allergic reaction to sulfa antibiotic Several years ago, allergic reaction to sulfa antibiotic     Patient Active Problem List   Diagnosis Date Noted  . UTI (urinary tract infection) 11/28/2017  . Pelvic muscle wasting 01/26/2017  . Cystocele, midline 01/26/2017  . Urinary frequency 12/26/2015  . Incontinence 12/07/2015  . Cystocele 12/07/2015  . Tinea cruris 12/07/2015  . Recurrent UTI 08/11/2015  . Vaginal atrophy 08/11/2015  . Fall 08/11/2015  . 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 Discoordination 05/08/2015  . Frank hematuria 09/27/2014  . A-fib (HCC) 08/28/2014  . Clinical depression 08/28/2014  . Atrial fibrillation (HCC) 08/28/2014  . Major depressive disorder with single episode 08/28/2014  . Bursitis of knee 08/07/2014  . Enthesopathy of knee 08/07/2014  . Edema leg 07/02/2014  . Bladder infection, chronic 06/29/2013  . Incomplete bladder emptying 07/29/2012  . FOM (  frequency of micturition) 07/29/2012  . Absence of bladder continence 07/29/2012  . Chronic kidney disease 06/01/2012  . Open wound of knee, leg (except thigh), and ankle 06/01/2012  . Skin rash 05/20/2012  . Fluid overload 05/20/2012  . Excess fluid volume 05/20/2012  . Acute kidney failure (HCC) 05/15/2012  . Allergic drug rash 05/15/2012  . 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  . Dermatitis due to drug taken  internally 05/15/2012  . Dermatitis due to drug reaction 05/15/2012  . Acute renal failure (HCC) 05/15/2012  . Diastolic dysfunction, left ventricle 05/14/2012  . Afib, paroxysmal, with RVR at times with BBB 05/13/2012  . Anemia 05/13/2012  . Cellulitis in diabetic foot (HCC) 05/12/2012  . DM (diabetes mellitus) (HCC) 05/12/2012  . HTN (hypertension) 05/12/2012  . Hypercholesterolemia 05/12/2012  . BP (high blood pressure) 05/12/2012  . Abscess or cellulitis of leg 04/18/2012  . Diabetes mellitus (HCC) 04/18/2012  . CN (constipation) 03/17/2012  . Abnormal gait 02/08/2012  . Benign essential HTN 02/08/2012  . Acid reflux 02/08/2012    Past Surgical History:  Procedure Laterality Date  . ABDOMINAL HYSTERECTOMY    . BLADDER SURGERY    . cataract surgery    . ENDOVENOUS ABLATION SAPHENOUS VEIN W/ LASER    . NASAL SINUS SURGERY    . TOE AMPUTATION    . TONSILLECTOMY      Prior to Admission medications   Medication Sig Start Date End Date Taking? Authorizing Provider  amiodarone (PACERONE) 200 MG tablet TAKE 1 TABLET (200 MG TOTAL) BY MOUTH ONCE DAILY. 12/16/15   [provider]  amitriptyline (ELAVIL) 50 MG tablet TAKE 1 TABLET (50 MG TOTAL) BY MOUTH NIGHTLY. 03/31/16   [provider]  aspirin EC 81 MG tablet Take 81 mg by mouth daily.    [provider]  atorvastatin (LIPITOR) 40 MG tablet Take 40 mg by mouth daily.    [provider]  glucose blood test strip 1 each 2 (two) times daily. Reported on 12/18/2015 05/22/15   [provider]  metoprolol (LOPRESSOR) 50 MG tablet Take 1 tablet (50 mg total) by mouth 2 (two) times daily. 05/23/12   Osvaldo Shipper, MD  nitrofurantoin (MACRODANTIN) 100 MG capsule Take 100 mg by mouth at bedtime.    [provider]  nitrofurantoin, macrocrystal-monohydrate, (MACROBID) 100 MG capsule Take 1 capsule (100 mg total) by mouth every 12 (twelve) hours. 03/18/18   McGowan, Carollee Herter A, PA-C  NOVOLOG  MIX 70/30 FLEXPEN (70-30) 100 UNIT/ML FlexPen Inject 0.12 mLs (12 Units total) into the skin 2 (two) times daily with a meal. 11/30/17   Mody, Sital, MD  pantoprazole (PROTONIX) 40 MG tablet Take 40 mg by mouth daily.    [provider]  PHARMACIST CHOICE LANCETS MISC USE 2 TIMES DAILY FOR DIABETIC TESTING 05/22/15   [provider]  tamsulosin (FLOMAX) 0.4 MG CAPS capsule Take 0.4 mg by mouth daily. 30 minutes before the same meal 03/19/16   [provider]    Allergies Ciprofloxacin; Sulfamethoxazole-trimethoprim; Augmentin [amoxicillin-pot clavulanate]; Hydrochlorothiazide; Levofloxacin; Losartan potassium-hctz; Nitrofurantoin; Sulfa antibiotics; Sulfasalazine; and Levaquin [levofloxacin in d5w]  Family History  Problem Relation Age of Onset  . Diabetes Mellitus II Mother   . Heart disease Mother   . Lung cancer Father   . Diabetes Paternal Grandmother   . Diabetes Maternal Grandmother   . Kidney disease Neg Hx   . Prostate cancer Neg Hx   . Bladder Cancer Neg Hx     Social History  Social History   Tobacco Use  . Smoking status: Never Smoker  . Smokeless tobacco: Never Used  Substance Use Topics  . Alcohol use: No    Alcohol/week: 0.0 standard drinks  . Drug use: No    Review of Systems Constitutional: Negative for fever. Eyes: Negative for visual changes. ENT: Negative for facial injury or neck injury Cardiovascular: Negative for chest injury. Respiratory: Negative for shortness of breath. Negative for chest wall injury. Gastrointestinal: Negative for abdominal pain or injury. Genitourinary: Negative for dysuria. Musculoskeletal: Negative for back injury, negative for arm or leg pain. Skin: Negative for laceration/abrasions. Neurological: + head injury.   ____________________________________________   PHYSICAL EXAM:  VITAL SIGNS: Vitals:   11/25/18 1136  BP: (!) 153/71  Pulse: (!) 59  Resp: 16  Temp: (!) 97.5 F (36.4 C)  SpO2: 98%    Constitutional: Alert and oriented. No acute distress. Does not appear intoxicated. HEENT Head: Normocephalic, scalp hematoma at the midline parietal region Face: No facial bony tenderness. Stable midface Ears: No hemotympanum bilaterally. No Battle sign Eyes: No eye injury. PERRL. No raccoon eyes Nose: Nontender. No epistaxis. No rhinorrhea Mouth/Throat: Mucous membranes are moist. No oropharyngeal blood. No dental injury. Airway patent without stridor. Normal voice. Neck: no C-collar in place. No midline c-spine tenderness.  Cardiovascular: Normal rate, regular rhythm. Normal and symmetric distal pulses are present in all extremities. Pulmonary/Chest: Chest wall is stable and nontender to palpation/compression. Normal respiratory effort. Breath sounds are normal. No crepitus.  Abdominal: Soft, nontender, non distended. Musculoskeletal: Nontender with normal full range of motion in all extremities. No deformities. No thoracic or lumbar midline spinal tenderness. Pelvis is stable. Skin: Skin is warm, dry and intact. No abrasions or contutions. Psychiatric: Speech and behavior are appropriate. Neurological: Normal speech and language. Moves all extremities to command. No gross focal neurologic deficits are appreciated.  Glascow Coma Score: 4 - Opens eyes on own 6 - Follows simple motor commands 5 - Alert and oriented GCS: 15   ____________________________________________   LABS (all labs ordered are listed, but only abnormal results are displayed)  Labs Reviewed  GLUCOSE, CAPILLARY - Abnormal; Notable for the following components:      Result Value   Glucose-Capillary 170 (*)    All other components within normal limits  CBG MONITORING, ED   ____________________________________________  EKG  ED ECG REPORT I, Nita Sickle, the attending physician, personally viewed and interpreted this ECG.  Sinus bradycardia, rate of 58, first-degree AV block, left bundle branch  block, prolonged QTC, no ST elevations or depressions.  Unchanged from prior. ____________________________________________  RADIOLOGY  I have personally reviewed the images performed during this visit and I agree with the Radiologist's read.   Interpretation by Radiologist:  Ct Head Wo Contrast  Result Date: 11/25/2018 CLINICAL DATA:  Fall, hit back of head.  Neck pain. EXAM: CT HEAD WITHOUT CONTRAST CT CERVICAL SPINE WITHOUT CONTRAST TECHNIQUE: Multidetector CT imaging of the head and cervical spine was performed following the standard protocol without intravenous contrast. Multiplanar CT image reconstructions of the cervical spine were also generated. COMPARISON:  03/29/2015 FINDINGS: CT HEAD FINDINGS Brain: There is atrophy and chronic small vessel disease changes. No acute intracranial abnormality. Specifically, no hemorrhage, hydrocephalus, mass lesion, acute infarction, or significant intracranial injury. Vascular: No hyperdense vessel or unexpected calcification. Skull: No acute calvarial abnormality. Sinuses/Orbits: Visualized paranasal sinuses and mastoids clear. Orbital soft tissues unremarkable. Other: None CT CERVICAL SPINE FINDINGS Alignment: Slight anterolisthesis of C6 on C7, 2 mm  related to facet disease. Skull base and vertebrae: No acute fracture. No primary bone lesion or focal pathologic process. Soft tissues and spinal canal: No prevertebral fluid or swelling. No visible canal hematoma. Disc levels: Advanced degenerative disc disease at C5-6 and C6-7. Moderate diffuse bilateral degenerative facet disease. Upper chest: No acute findings Other: No acute findings.  Carotid artery calcifications. IMPRESSION: Atrophy, chronic microvascular disease. No acute intracranial abnormality. Degenerative disc and facet disease in the cervical spine. No acute bony abnormality. Electronically Signed   By: Charlett NoseKevin  Dover M.D.   On: 11/25/2018 12:02   Ct Cervical Spine Wo Contrast  Result Date:  11/25/2018 CLINICAL DATA:  Fall, hit back of head.  Neck pain. EXAM: CT HEAD WITHOUT CONTRAST CT CERVICAL SPINE WITHOUT CONTRAST TECHNIQUE: Multidetector CT imaging of the head and cervical spine was performed following the standard protocol without intravenous contrast. Multiplanar CT image reconstructions of the cervical spine were also generated. COMPARISON:  03/29/2015 FINDINGS: CT HEAD FINDINGS Brain: There is atrophy and chronic small vessel disease changes. No acute intracranial abnormality. Specifically, no hemorrhage, hydrocephalus, mass lesion, acute infarction, or significant intracranial injury. Vascular: No hyperdense vessel or unexpected calcification. Skull: No acute calvarial abnormality. Sinuses/Orbits: Visualized paranasal sinuses and mastoids clear. Orbital soft tissues unremarkable. Other: None CT CERVICAL SPINE FINDINGS Alignment: Slight anterolisthesis of C6 on C7, 2 mm related to facet disease. Skull base and vertebrae: No acute fracture. No primary bone lesion or focal pathologic process. Soft tissues and spinal canal: No prevertebral fluid or swelling. No visible canal hematoma. Disc levels: Advanced degenerative disc disease at C5-6 and C6-7. Moderate diffuse bilateral degenerative facet disease. Upper chest: No acute findings Other: No acute findings.  Carotid artery calcifications. IMPRESSION: Atrophy, chronic microvascular disease. No acute intracranial abnormality. Degenerative disc and facet disease in the cervical spine. No acute bony abnormality. Electronically Signed   By: Charlett NoseKevin  Dover M.D.   On: 11/25/2018 12:02     ____________________________________________   PROCEDURES  Procedure(s) performed: None Procedures Critical Care performed:  None ____________________________________________   INITIAL IMPRESSION / ASSESSMENT AND PLAN / ED COURSE  84 y.o. female with several chronic medical problems as listed below who presents for evaluation of mechanical fall well  with head trauma and no LOC.  Patient is extremely well-appearing, alert and oriented x3, neurologically intact, no signs or symptoms of basilar skull fracture.  She does have a midline parietal hematoma with no laceration.  No CT and L-spine tenderness, full painless range of motion of all joints.  CT head and cervical spine pending.  Will give Tylenol for pain.  No indication for labs at this time.    _________________________ 12:57 PM on 11/25/2018 -----------------------------------------  CTs with no injuries.  Patient remains neurologically intact with no further complaints.  EKG with no evidence of dysrhythmias.  Blood glucose is normal.  At this time patient stable for discharge back to SNF.    As part of my medical decision making, I reviewed the following data within the electronic MEDICAL RECORD NUMBER Nursing notes reviewed and incorporated, EKG interpreted , Old chart reviewed, Radiograph reviewed , Notes from prior ED visits and [place] Controlled Substance Database    Pertinent labs & imaging results that were available during my care of the patient were reviewed by me and considered in my medical decision making (see chart for details).    ____________________________________________   FINAL CLINICAL IMPRESSION(S) / ED DIAGNOSES  Final diagnoses:  Fall, initial encounter  Hematoma of scalp, initial encounter  NEW MEDICATIONS STARTED DURING THIS VISIT:  ED Discharge Orders    None       Note:  This document was prepared using Dragon voice recognition software and may include unintentional dictation errors.    Nita SickleVeronese, [place], MD 11/25/18 [phone]

## 2018-11-25 NOTE — ED Triage Notes (Signed)
[facility]. Patient states she got up to do something this morning when she lost her balance and fell. Patient denies LOC. Not taking anticoagulants. Patient with raised hematoma to back of head. No bleeding noted. Alert and oriented x4.

## 2018-11-25 NOTE — Discharge Instructions (Addendum)

## 2018-11-25 NOTE — ED Notes (Signed)
Signature pad unavailable for patient to sign. Patient verbalized understanding of discharge instructions and follow-up care. [facility]

## 2018-11-28 ENCOUNTER — Emergency Department: Payer: Medicare Other

## 2018-11-28 ENCOUNTER — Inpatient Hospital Stay
Admission: EM | Admit: 2018-11-28 | Discharge: 2018-11-30 | DRG: 281 | Disposition: A | Discharge status: Home Health | Payer: Medicare Other | Attending: Internal Medicine | Admitting: Internal Medicine

## 2018-11-28 ENCOUNTER — Other Ambulatory Visit: Payer: Self-pay

## 2018-11-28 DIAGNOSIS — Z833 Family history of diabetes mellitus: Secondary | ICD-10-CM

## 2018-11-28 DIAGNOSIS — Z79899 Other long term (current) drug therapy: Secondary | ICD-10-CM

## 2018-11-28 DIAGNOSIS — K219 Gastro-esophageal reflux disease without esophagitis: Secondary | ICD-10-CM | POA: Diagnosis present

## 2018-11-28 DIAGNOSIS — E782 Mixed hyperlipidemia: Secondary | ICD-10-CM | POA: Diagnosis present

## 2018-11-28 DIAGNOSIS — I509 Heart failure, unspecified: Secondary | ICD-10-CM | POA: Diagnosis present

## 2018-11-28 DIAGNOSIS — Z794 Long term (current) use of insulin: Secondary | ICD-10-CM

## 2018-11-28 DIAGNOSIS — I130 Hypertensive heart and chronic kidney disease with heart failure and stage 1 through stage 4 chronic kidney disease, or unspecified chronic kidney disease: Secondary | ICD-10-CM | POA: Diagnosis present

## 2018-11-28 DIAGNOSIS — I214 Non-ST elevation (NSTEMI) myocardial infarction: Secondary | ICD-10-CM | POA: Diagnosis not present

## 2018-11-28 DIAGNOSIS — Z8249 Family history of ischemic heart disease and other diseases of the circulatory system: Secondary | ICD-10-CM

## 2018-11-28 DIAGNOSIS — E119 Type 2 diabetes mellitus without complications: Secondary | ICD-10-CM

## 2018-11-28 DIAGNOSIS — I480 Paroxysmal atrial fibrillation: Secondary | ICD-10-CM | POA: Diagnosis present

## 2018-11-28 DIAGNOSIS — E7800 Pure hypercholesterolemia, unspecified: Secondary | ICD-10-CM | POA: Diagnosis present

## 2018-11-28 DIAGNOSIS — I10 Essential (primary) hypertension: Secondary | ICD-10-CM | POA: Diagnosis present

## 2018-11-28 DIAGNOSIS — E1122 Type 2 diabetes mellitus with diabetic chronic kidney disease: Secondary | ICD-10-CM | POA: Diagnosis present

## 2018-11-28 DIAGNOSIS — N183 Chronic kidney disease, stage 3 (moderate): Secondary | ICD-10-CM | POA: Diagnosis present

## 2018-11-28 DIAGNOSIS — Z90710 Acquired absence of both cervix and uterus: Secondary | ICD-10-CM

## 2018-11-28 DIAGNOSIS — I2510 Atherosclerotic heart disease of native coronary artery without angina pectoris: Secondary | ICD-10-CM | POA: Diagnosis present

## 2018-11-28 DIAGNOSIS — Z7982 Long term (current) use of aspirin: Secondary | ICD-10-CM

## 2018-11-28 LAB — CBC WITH DIFFERENTIAL/PLATELET
Abs Immature Granulocytes: 0.03 10*3/uL (ref 0.00–0.07)
Basophils Absolute: 0 10*3/uL (ref 0.0–0.1)
Basophils Relative: 0 %
Eosinophils Absolute: 0.1 10*3/uL (ref 0.0–0.5)
Eosinophils Relative: 1 %
HCT: 37 % (ref 36.0–46.0)
Hemoglobin: 12 g/dL (ref 12.0–15.0)
Immature Granulocytes: 0 %
Lymphocytes Relative: 19 %
Lymphs Abs: 1.5 10*3/uL (ref 0.7–4.0)
MCH: 32 pg (ref 26.0–34.0)
MCHC: 32.4 g/dL (ref 30.0–36.0)
MCV: 98.7 fL (ref 80.0–100.0)
Monocytes Absolute: 0.7 10*3/uL (ref 0.1–1.0)
Monocytes Relative: 9 %
NRBC: 0 % (ref 0.0–0.2)
Neutro Abs: 5.4 10*3/uL (ref 1.7–7.7)
Neutrophils Relative %: 71 %
Platelets: 202 10*3/uL (ref 150–400)
RBC: 3.75 MIL/uL — ABNORMAL LOW (ref 3.87–5.11)
RDW: 13.7 % (ref 11.5–15.5)
WBC: 7.6 10*3/uL (ref 4.0–10.5)

## 2018-11-28 LAB — COMPREHENSIVE METABOLIC PANEL
ALT: 18 U/L (ref 0–44)
ANION GAP: 9 (ref 5–15)
AST: 23 U/L (ref 15–41)
Albumin: 3.3 g/dL — ABNORMAL LOW (ref 3.5–5.0)
Alkaline Phosphatase: 90 U/L (ref 38–126)
BUN: 35 mg/dL — ABNORMAL HIGH (ref 8–23)
CO2: 25 mmol/L (ref 22–32)
Calcium: 9 mg/dL (ref 8.9–10.3)
Chloride: 103 mmol/L (ref 98–111)
Creatinine, Ser: 1.42 mg/dL — ABNORMAL HIGH (ref 0.44–1.00)
GFR calc Af Amer: 40 mL/min — ABNORMAL LOW (ref >60)
GFR calc non Af Amer: 34 mL/min — ABNORMAL LOW (ref >60)
Glucose, Bld: 166 mg/dL — ABNORMAL HIGH (ref 70–99)
Potassium: 3.5 mmol/L (ref 3.5–5.1)
Sodium: 137 mmol/L (ref 135–145)
Total Bilirubin: 0.4 mg/dL (ref 0.3–1.2)
Total Protein: 6.7 g/dL (ref 6.5–8.1)

## 2018-11-28 LAB — TROPONIN I: Troponin I: 1.62 ng/mL (ref <0.03)

## 2018-11-28 LAB — PROTIME-INR
INR: 1.07
Prothrombin Time: 13.8 seconds (ref 11.4–15.2)

## 2018-11-28 LAB — APTT: aPTT: 28 seconds (ref 24–36)

## 2018-11-28 MED ORDER — HEPARIN (PORCINE) 25000 UT/250ML-% IV SOLN
1200.0000 [IU]/h | INTRAVENOUS | Status: DC
  Administered 2018-11-28: 1200 [IU]/h via INTRAVENOUS
  Filled 2018-11-28: qty 250

## 2018-11-28 MED ORDER — HEPARIN BOLUS VIA INFUSION
4000.0000 [IU] | Freq: Once | INTRAVENOUS | Status: AC
  Administered 2018-11-28: 4000 [IU] via INTRAVENOUS
  Filled 2018-11-28: qty 4000

## 2018-11-28 MED ORDER — ASPIRIN 81 MG PO CHEW
324.0000 mg | CHEWABLE_TABLET | Freq: Once | ORAL | Status: DC
  Filled 2018-11-28: qty 4

## 2018-11-28 NOTE — ED Provider Notes (Addendum)
[facility] Emergency Department Provider Note  ____________________________________________   I have reviewed the triage vital signs and the nursing notes. Where available I have reviewed prior notes and, if possible and indicated, outside hospital notes.    HISTORY  Chief Complaint Chest Pain    HPI Pamela [name] is a 84 y.o. female  with a history of the problems listed below including atrial fibrillation, left bundle branch block, fell several days ago and since that time she is had chest wall discomfort hurts when she touches it when she changes position.  She is not touching it does not really hurt.  No other alleviating or aggravating factors no radiation.  Is in the left side of her chest.  No shortness of breath.  Says sharp discomfort.  It is not pleuritic.  She has chronic leg swelling denies any significant change in that.  Aggravating factors.  Vital signs stable for EMS.     Past Medical History:  Diagnosis Date  . A-fib (HCC) 08/28/2014   Overview:  Overview:  Diastolic dysfunction on ECHO 04/2012-[facility]   . Abnormal gait 02/08/2012  . Abscess or cellulitis of leg 04/18/2012  . Absence of bladder continence 07/29/2012  . Acid reflux 02/08/2012  . Acute kidney failure (HCC) 05/15/2012  . Afib, paroxysmal, with RVR at times with BBB 05/13/2012  . Allergic drug rash 05/15/2012  . Anemia 05/13/2012  . 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 Benign essential HTN 02/08/2012  . Bladder infection, chronic 06/29/2013  . BP (high blood pressure) 05/12/2012  . Bursitis of knee 08/07/2014  . Cellulitis in diabetic foot (HCC) 05/12/2012  . CHF (congestive heart failure) (HCC)   . Chronic kidney disease 06/01/2012   Overview:  Overview:   12/2012- Renal US, simple L cyst. UIEP normal   . Clinical depression 08/28/2014  . CN (constipation) 03/17/2012  . Dermatitis due to drug reaction 05/15/2012  . Diabetes mellitus   . Diastolic dysfunction, left ventricle  05/14/2012  . DM (diabetes mellitus) (HCC) 05/12/2012  . Dysrhythmia   . Dysuria   . Edema leg 07/02/2014  . Enthesopathy of knee 08/07/2014  . Excess fluid volume 05/20/2012  . FOM (frequency of micturition) 07/29/2012  . Frank hematuria 09/27/2014  . Gangrene (HCC)   . GERD (gastroesophageal reflux disease)   . Heart disease   . High cholesterol   . HTN (hypertension) 05/12/2012  . Hypercholesterolemia 05/12/2012  . Hypertension   . Incomplete bladder emptying 07/29/2012  . Left bundle branch block (LBBB)   . Open wound of knee, leg (except thigh), and ankle 06/01/2012  . 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   Overview:  Overview:  Several years ago, allergic reaction to sulfa antibiotic Several years ago, allergic reaction to sulfa antibiotic     Patient Active Problem List   Diagnosis Date Noted  . UTI (urinary tract infection) 11/28/2017  . Pelvic muscle wasting 01/26/2017  . Cystocele, midline 01/26/2017  . Urinary frequency 12/26/2015  . Incontinence 12/07/2015  . Cystocele 12/07/2015  . Tinea cruris 12/07/2015  . Recurrent UTI 08/11/2015  . Vaginal atrophy 08/11/2015  . Fall 08/11/2015  . 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 Discoordination 05/08/2015  . Frank hematuria 09/27/2014  . A-fib (HCC) 08/28/2014  . Clinical depression 08/28/2014  . Atrial fibrillation (HCC) 08/28/2014  . Major depressive disorder with single episode 08/28/2014  . Bursitis of knee 08/07/2014  . Enthesopathy of knee 08/07/2014  . Edema leg 07/02/2014  . Bladder infection, chronic  06/29/2013  . Incomplete bladder emptying 07/29/2012  . FOM (frequency of micturition) 07/29/2012  . Absence of bladder continence 07/29/2012  . Chronic kidney disease 06/01/2012  . Open wound of knee, leg (except thigh), and ankle 06/01/2012  . Skin rash 05/20/2012  . Fluid overload 05/20/2012  . Excess fluid volume 05/20/2012  . Acute kidney failure (HCC) 05/15/2012  . Allergic drug rash 05/15/2012  .  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  . Dermatitis due to drug taken internally 05/15/2012  . Dermatitis due to drug reaction 05/15/2012  . Acute renal failure (HCC) 05/15/2012  . Diastolic dysfunction, left ventricle 05/14/2012  . Afib, paroxysmal, with RVR at times with BBB 05/13/2012  . Anemia 05/13/2012  . Cellulitis in diabetic foot (HCC) 05/12/2012  . DM (diabetes mellitus) (HCC) 05/12/2012  . HTN (hypertension) 05/12/2012  . Hypercholesterolemia 05/12/2012  . BP (high blood pressure) 05/12/2012  . Abscess or cellulitis of leg 04/18/2012  . Diabetes mellitus (HCC) 04/18/2012  . CN (constipation) 03/17/2012  . Abnormal gait 02/08/2012  . Benign essential HTN 02/08/2012  . Acid reflux 02/08/2012    Past Surgical History:  Procedure Laterality Date  . ABDOMINAL HYSTERECTOMY    . BLADDER SURGERY    . cataract surgery    . ENDOVENOUS ABLATION SAPHENOUS VEIN W/ LASER    . NASAL SINUS SURGERY    . TOE AMPUTATION    . TONSILLECTOMY      Prior to Admission medications   Medication Sig Start Date End Date Taking? Authorizing Provider  amiodarone (PACERONE) 200 MG tablet TAKE 1 TABLET (200 MG TOTAL) BY MOUTH ONCE DAILY. 12/16/15   [provider]  amitriptyline (ELAVIL) 50 MG tablet TAKE 1 TABLET (50 MG TOTAL) BY MOUTH NIGHTLY. 03/31/16   [provider]  aspirin EC 81 MG tablet Take 81 mg by mouth daily.    [provider]  atorvastatin (LIPITOR) 40 MG tablet Take 40 mg by mouth daily.    [provider]  glucose blood test strip 1 each 2 (two) times daily. Reported on 12/18/2015 05/22/15   [provider]  metoprolol (LOPRESSOR) 50 MG tablet Take 1 tablet (50 mg total) by mouth 2 (two) times daily. 05/23/12   Osvaldo Shipper, MD  nitrofurantoin (MACRODANTIN) 100 MG capsule Take 100 mg by mouth at bedtime.    [provider]  nitrofurantoin, macrocrystal-monohydrate, (MACROBID) 100 MG capsule Take 1 capsule (100 mg total) by  mouth every 12 (twelve) hours. 03/18/18   McGowan, Carollee Herter A, PA-C  NOVOLOG MIX 70/30 FLEXPEN (70-30) 100 UNIT/ML FlexPen Inject 0.12 mLs (12 Units total) into the skin 2 (two) times daily with a meal. 11/30/17   Mody, Sital, MD  pantoprazole (PROTONIX) 40 MG tablet Take 40 mg by mouth daily.    [provider]  PHARMACIST CHOICE LANCETS MISC USE 2 TIMES DAILY FOR DIABETIC TESTING 05/22/15   [provider]  tamsulosin (FLOMAX) 0.4 MG CAPS capsule Take 0.4 mg by mouth daily. 30 minutes before the same meal 03/19/16   [provider]    Allergies Ciprofloxacin; Sulfamethoxazole-trimethoprim; Augmentin [amoxicillin-pot clavulanate]; Hydrochlorothiazide; Levofloxacin; Losartan potassium-hctz; Nitrofurantoin; Sulfa antibiotics; Sulfasalazine; and Levaquin [levofloxacin in d5w]  Family History  Problem Relation Age of Onset  . Diabetes Mellitus II Mother   . Heart disease Mother   . Lung cancer Father   . Diabetes Paternal Grandmother   . Diabetes Maternal Grandmother   . Kidney disease Neg Hx   . Prostate cancer Neg Hx   .  Bladder Cancer Neg Hx     Social History Social History   Tobacco Use  . Smoking status: Never Smoker  . Smokeless tobacco: Never Used  Substance Use Topics  . Alcohol use: No    Alcohol/week: 0.0 standard drinks  . Drug use: No    Review of Systems Constitutional: No fever/chills Eyes: No visual changes. ENT: No sore throat. No stiff neck no neck pain Cardiovascular: Denies chest pain. Respiratory: Denies shortness of breath. Gastrointestinal:   no vomiting.  No diarrhea.  No constipation. Genitourinary: Negative for dysuria. Musculoskeletal: Negative lower extremity swelling Skin: Negative for rash. Neurological: Negative for severe headaches, focal weakness or numbness.   ____________________________________________   PHYSICAL EXAM:  VITAL SIGNS: ED Triage Vitals  Enc Vitals Group     BP --      Pulse Rate 11/28/18 2049  67     Resp 11/28/18 2049 (!) 23     Temp 11/28/18 2049 98 F (36.7 C)     Temp Source 11/28/18 2049 Oral     SpO2 11/28/18 2049 99 %     Weight 11/28/18 2048 172 lb (78 kg)     Height 11/28/18 2048 5\' 5"  (1.651 m)     Head Circumference --      Peak Flow --      Pain Score 11/28/18 2048 5     Pain Loc --      Pain Edu? --      Excl. in GC? --     Constitutional: Alert and oriented. Well appearing and in no acute distress. Eyes: Conjunctivae are normal Head: Atraumatic HEENT: No congestion/rhinnorhea. Mucous membranes are moist.  Oropharynx non-erythematous Neck:   Nontender with no meningismus, no masses, no stridor Auscultated palpation chest wall necessary patient states "ouch that the pain right there".  No crepitus no flail chest no lesions noted. Cardiovascular: Normal rate, regular rhythm. Grossly normal heart sounds.  Good peripheral circulation. Respiratory: Normal respiratory effort.  No retractions. Lungs CTAB. Abdominal: Soft and nontender. No distention. No guarding no rebound Back:  There is no focal tenderness or step off.  there is no midline tenderness there are no lesions noted. there is no CVA tenderness  Musculoskeletal: No lower extremity tenderness, no upper extremity tenderness. No joint effusions, no DVT signs strong distal pulses no edema Neurologic:  Normal speech and language. No gross focal neurologic deficits are appreciated.  Skin:  Skin is warm, dry and intact. No rash noted. Psychiatric: Mood and affect are normal. Speech and behavior are normal.  ____________________________________________   LABS (all labs ordered are listed, but only abnormal results are displayed)  Labs Reviewed  COMPREHENSIVE METABOLIC PANEL - Abnormal; Notable for the following components:      Result Value   Glucose, Bld 166 (*)    BUN 35 (*)    Creatinine, Ser 1.42 (*)    Albumin 3.3 (*)    GFR calc non Af Amer 34 (*)    GFR calc Af Amer 40 (*)    All other  components within normal limits  CBC WITH DIFFERENTIAL/PLATELET - Abnormal; Notable for the following components:   RBC 3.75 (*)    All other components within normal limits  TROPONIN I - Abnormal; Notable for the following components:   Troponin I 1.62 (*)    All other components within normal limits    Pertinent labs  results that were available during my care of the patient were reviewed by me and considered in  my medical decision making (see chart for details). ____________________________________________  EKG  I personally interpreted any EKGs ordered by me or triage Left bundle branch block, rate 67, sinus rhythm, prolonged PR, no significant change from prior ____________________________________________  RADIOLOGY  Pertinent labs & imaging results that were available during my care of the patient were reviewed by me and considered in my medical decision making (see chart for details). If possible, patient and/or family made aware of any abnormal findings.  Dg Chest 2 View  Result Date: 11/28/2018 CLINICAL DATA:  Acute chest pain. EXAM: CHEST - 2 VIEW COMPARISON:  04/29/2018 and prior radiographs FINDINGS: The cardiomediastinal silhouette is unremarkable. There is no evidence of focal airspace disease, pulmonary edema, suspicious pulmonary nodule/mass, pleural effusion, or pneumothorax. No acute bony abnormalities are identified. IMPRESSION: No active cardiopulmonary disease. Electronically Signed   By: Harmon Pier M.D.   On: 11/28/2018 21:23   ____________________________________________    PROCEDURES  Procedure(s) performed: None  Procedures  Critical Care performed: CRITICAL CARE Performed by: Jeanmarie Plant   Total critical care time: 38 minutes  Critical care time was exclusive of separately billable procedures and treating other patients.  Critical care was necessary to treat or prevent imminent or life-threatening deterioration.  Critical care was time  spent personally by me on the following activities: development of treatment plan with patient and/or surrogate as well as nursing, discussions with consultants, evaluation of patient's response to treatment, examination of patient, obtaining history from patient or surrogate, ordering and performing treatments and interventions, ordering and review of laboratory studies, ordering and review of radiographic studies, pulse oximetry and re-evaluation of patient's condition.   ____________________________________________   INITIAL IMPRESSION / ASSESSMENT AND PLAN / ED COURSE  Pertinent labs & imaging results that were available during my care of the patient were reviewed by me and considered in my medical decision making (see chart for details).  Patient here with very reproducible chest wall pain however given her age and multiple comorbidities we did a cardiac work-up which reveals a elevated troponin.  We are giving her aspirin.  Very atypical presentation but undeniably elevated troponin.  This is been going on she states for 3 days, since her fall.  Was a non-syncopal fall at that time.  She did not have the pain she states when she came in here after that it gradually developed afterwards.  Very difficult to interpret her EKG but no evidence of acute ischemia to my read, we will give her aspirin, and admit her to the hospital.   ____________________________________________   FINAL CLINICAL IMPRESSION(S) / ED DIAGNOSES  Final diagnoses:  None      This chart was dictated using voice recognition software.  Despite best efforts to proofread,  errors can occur which can change meaning.      Jeanmarie Plant, MD 11/28/18 2151    Jeanmarie Plant, MD 11/28/18 2156

## 2018-11-28 NOTE — ED Notes (Signed)
Report given to Cole RN 

## 2018-11-28 NOTE — ED Notes (Signed)
Patient transported to X-ray 

## 2018-11-28 NOTE — ED Notes (Signed)
EKG performed by EDT Huntley DecSara

## 2018-11-28 NOTE — ED Triage Notes (Signed)
Pt comes via [facility] from [place] with c/o chest pain for 2 days. Pt states it is midsternal. Pt denies any SOB.  EMS reports VSS. Pt was given 4 baby aspirin.  Pt is alert and oriented.

## 2018-11-28 NOTE — ED Notes (Signed)
Date and time results received: 11/28/18 10:02 PM (use smartphrase ".now" to insert current time)  Test: Troponin Critical Value: 1.62  Name of Provider Notified: Dr. Alphonzo LemmingsMcShane  Orders Received? Or Actions Taken?: Orders Received - See Orders for details

## 2018-11-29 ENCOUNTER — Inpatient Hospital Stay
Admit: 2018-11-29 | Discharge: 2018-11-29 | Disposition: A | Discharge status: Home / Self Care | Payer: Medicare Other | Attending: Internal Medicine | Admitting: Internal Medicine

## 2018-11-29 ENCOUNTER — Encounter
Admission: EM | Disposition: A | Discharge status: Home Health | Payer: Self-pay | Source: Home / Self Care | Attending: Internal Medicine

## 2018-11-29 DIAGNOSIS — Z90710 Acquired absence of both cervix and uterus: Secondary | ICD-10-CM | POA: Diagnosis not present

## 2018-11-29 DIAGNOSIS — I2510 Atherosclerotic heart disease of native coronary artery without angina pectoris: Secondary | ICD-10-CM | POA: Diagnosis present

## 2018-11-29 DIAGNOSIS — K219 Gastro-esophageal reflux disease without esophagitis: Secondary | ICD-10-CM | POA: Diagnosis present

## 2018-11-29 DIAGNOSIS — E1122 Type 2 diabetes mellitus with diabetic chronic kidney disease: Secondary | ICD-10-CM | POA: Diagnosis present

## 2018-11-29 DIAGNOSIS — E782 Mixed hyperlipidemia: Secondary | ICD-10-CM | POA: Diagnosis present

## 2018-11-29 DIAGNOSIS — Z794 Long term (current) use of insulin: Secondary | ICD-10-CM | POA: Diagnosis not present

## 2018-11-29 DIAGNOSIS — I480 Paroxysmal atrial fibrillation: Secondary | ICD-10-CM | POA: Diagnosis present

## 2018-11-29 DIAGNOSIS — I509 Heart failure, unspecified: Secondary | ICD-10-CM | POA: Diagnosis present

## 2018-11-29 DIAGNOSIS — I130 Hypertensive heart and chronic kidney disease with heart failure and stage 1 through stage 4 chronic kidney disease, or unspecified chronic kidney disease: Secondary | ICD-10-CM | POA: Diagnosis present

## 2018-11-29 DIAGNOSIS — Z79899 Other long term (current) drug therapy: Secondary | ICD-10-CM | POA: Diagnosis not present

## 2018-11-29 DIAGNOSIS — Z7982 Long term (current) use of aspirin: Secondary | ICD-10-CM | POA: Diagnosis not present

## 2018-11-29 DIAGNOSIS — Z8249 Family history of ischemic heart disease and other diseases of the circulatory system: Secondary | ICD-10-CM | POA: Diagnosis not present

## 2018-11-29 DIAGNOSIS — Z833 Family history of diabetes mellitus: Secondary | ICD-10-CM | POA: Diagnosis not present

## 2018-11-29 DIAGNOSIS — I214 Non-ST elevation (NSTEMI) myocardial infarction: Secondary | ICD-10-CM | POA: Diagnosis present

## 2018-11-29 DIAGNOSIS — N183 Chronic kidney disease, stage 3 (moderate): Secondary | ICD-10-CM | POA: Diagnosis present

## 2018-11-29 HISTORY — PX: LEFT HEART CATH AND CORONARY ANGIOGRAPHY: CATH118249

## 2018-11-29 LAB — BASIC METABOLIC PANEL
Anion gap: 8 (ref 5–15)
BUN: 34 mg/dL — AB (ref 8–23)
CO2: 24 mmol/L (ref 22–32)
Calcium: 8.9 mg/dL (ref 8.9–10.3)
Chloride: 106 mmol/L (ref 98–111)
Creatinine, Ser: 1.24 mg/dL — ABNORMAL HIGH (ref 0.44–1.00)
GFR calc Af Amer: 47 mL/min — ABNORMAL LOW (ref >60)
GFR calc non Af Amer: 40 mL/min — ABNORMAL LOW (ref >60)
Glucose, Bld: 87 mg/dL (ref 70–99)
Potassium: 3.7 mmol/L (ref 3.5–5.1)
Sodium: 138 mmol/L (ref 135–145)

## 2018-11-29 LAB — CBC
HEMATOCRIT: 35.6 % — AB (ref 36.0–46.0)
Hemoglobin: 11.5 g/dL — ABNORMAL LOW (ref 12.0–15.0)
MCH: 31.6 pg (ref 26.0–34.0)
MCHC: 32.3 g/dL (ref 30.0–36.0)
MCV: 97.8 fL (ref 80.0–100.0)
Platelets: 182 10*3/uL (ref 150–400)
RBC: 3.64 MIL/uL — ABNORMAL LOW (ref 3.87–5.11)
RDW: 13.5 % (ref 11.5–15.5)
WBC: 7.3 10*3/uL (ref 4.0–10.5)
nRBC: 0 % (ref 0.0–0.2)

## 2018-11-29 LAB — TROPONIN I
Troponin I: 1.69 ng/mL (ref <0.03)
Troponin I: 1.5 ng/mL (ref <0.03)
Troponin I: 1.3 ng/mL (ref <0.03)

## 2018-11-29 LAB — GLUCOSE, CAPILLARY
Glucose-Capillary: 99 mg/dL (ref 70–99)
Glucose-Capillary: 98 mg/dL (ref 70–99)
Glucose-Capillary: 159 mg/dL — ABNORMAL HIGH (ref 70–99)
Glucose-Capillary: 184 mg/dL — ABNORMAL HIGH (ref 70–99)

## 2018-11-29 LAB — HEPARIN LEVEL (UNFRACTIONATED): Heparin Unfractionated: 1.04 IU/mL — ABNORMAL HIGH (ref 0.30–0.70)

## 2018-11-29 LAB — MRSA PCR SCREENING: MRSA by PCR: NEGATIVE

## 2018-11-29 SURGERY — LEFT HEART CATH AND CORONARY ANGIOGRAPHY
Anesthesia: Moderate Sedation

## 2018-11-29 MED ORDER — NITROGLYCERIN 0.4 MG SL SUBL: 0.4000 mg | SUBLINGUAL_TABLET | SUBLINGUAL | Status: DC | PRN

## 2018-11-29 MED ORDER — ACETAMINOPHEN 325 MG PO TABS: 650.0000 mg | ORAL_TABLET | ORAL | Status: DC | PRN

## 2018-11-29 MED ORDER — FENTANYL CITRATE (PF) 100 MCG/2ML IJ SOLN
INTRAMUSCULAR | Status: AC
  Filled 2018-11-29: qty 2

## 2018-11-29 MED ORDER — HEPARIN (PORCINE) 25000 UT/250ML-% IV SOLN
1050.0000 [IU]/h | INTRAVENOUS | Status: DC
  Administered 2018-11-29: 1050 [IU]/h via INTRAVENOUS

## 2018-11-29 MED ORDER — SODIUM CHLORIDE 0.9 % WEIGHT BASED INFUSION: 1.0000 mL/kg/h | INTRAVENOUS | Status: DC

## 2018-11-29 MED ORDER — ONDANSETRON HCL 4 MG/2ML IJ SOLN: 4.0000 mg | Freq: Four times a day (QID) | INTRAMUSCULAR | Status: DC | PRN

## 2018-11-29 MED ORDER — PANTOPRAZOLE SODIUM 40 MG PO TBEC
40.0000 mg | DELAYED_RELEASE_TABLET | Freq: Every day | ORAL | Status: DC
  Administered 2018-11-29 – 2018-11-30 (×2): 40 mg via ORAL
  Filled 2018-11-29 (×2): qty 1

## 2018-11-29 MED ORDER — METOPROLOL TARTRATE 25 MG PO TABS
25.0000 mg | ORAL_TABLET | Freq: Two times a day (BID) | ORAL | Status: DC
  Administered 2018-11-30: 25 mg via ORAL
  Filled 2018-11-29: qty 1

## 2018-11-29 MED ORDER — ASPIRIN 81 MG PO CHEW: 81.0000 mg | CHEWABLE_TABLET | ORAL | Status: DC

## 2018-11-29 MED ORDER — ACETAMINOPHEN 650 MG RE SUPP: 650.0000 mg | Freq: Four times a day (QID) | RECTAL | Status: DC | PRN

## 2018-11-29 MED ORDER — HEPARIN (PORCINE) IN NACL 1000-0.9 UT/500ML-% IV SOLN
INTRAVENOUS | Status: AC
  Filled 2018-11-29: qty 1000

## 2018-11-29 MED ORDER — SODIUM CHLORIDE 0.9 % WEIGHT BASED INFUSION: 3.0000 mL/kg/h | INTRAVENOUS | Status: DC

## 2018-11-29 MED ORDER — INSULIN ASPART 100 UNIT/ML ~~LOC~~ SOLN
0.0000 [IU] | Freq: Four times a day (QID) | SUBCUTANEOUS | Status: DC
  Administered 2018-11-29: 2 [IU] via SUBCUTANEOUS
  Administered 2018-11-30: 3 [IU] via SUBCUTANEOUS
  Administered 2018-11-30 (×3): 2 [IU] via SUBCUTANEOUS
  Filled 2018-11-29 (×6): qty 1

## 2018-11-29 MED ORDER — SODIUM CHLORIDE 0.9% FLUSH: 3.0000 mL | INTRAVENOUS | Status: DC | PRN

## 2018-11-29 MED ORDER — SODIUM CHLORIDE 0.9 % IV SOLN: 250.0000 mL | INTRAVENOUS | Status: DC | PRN

## 2018-11-29 MED ORDER — IOPAMIDOL (ISOVUE-300) INJECTION 61%
INTRAVENOUS | Status: DC | PRN
  Administered 2018-11-29: 120 mL via INTRA_ARTERIAL

## 2018-11-29 MED ORDER — ACETAMINOPHEN 325 MG PO TABS: 650.0000 mg | ORAL_TABLET | Freq: Four times a day (QID) | ORAL | Status: DC | PRN

## 2018-11-29 MED ORDER — SODIUM CHLORIDE 0.9% FLUSH: 3.0000 mL | Freq: Two times a day (BID) | INTRAVENOUS | Status: DC

## 2018-11-29 MED ORDER — MEMANTINE HCL ER 7 MG PO CP24
7.0000 mg | ORAL_CAPSULE | Freq: Every day | ORAL | Status: DC
  Administered 2018-11-29 – 2018-11-30 (×2): 7 mg via ORAL
  Filled 2018-11-29 (×2): qty 1

## 2018-11-29 MED ORDER — ATORVASTATIN CALCIUM 20 MG PO TABS
40.0000 mg | ORAL_TABLET | Freq: Every day | ORAL | Status: DC
  Administered 2018-11-29 – 2018-11-30 (×2): 40 mg via ORAL
  Filled 2018-11-29 (×2): qty 2

## 2018-11-29 MED ORDER — OXYCODONE HCL 5 MG PO TABS
5.0000 mg | ORAL_TABLET | ORAL | Status: DC | PRN
  Administered 2018-11-30 (×2): 5 mg via ORAL
  Filled 2018-11-29 (×2): qty 1

## 2018-11-29 MED ORDER — AMIODARONE HCL 200 MG PO TABS
200.0000 mg | ORAL_TABLET | Freq: Every day | ORAL | Status: DC
  Administered 2018-11-29 – 2018-11-30 (×2): 200 mg via ORAL
  Filled 2018-11-29 (×2): qty 1

## 2018-11-29 MED ORDER — ONDANSETRON HCL 4 MG PO TABS: 4.0000 mg | ORAL_TABLET | Freq: Four times a day (QID) | ORAL | Status: DC | PRN

## 2018-11-29 MED ORDER — CLOPIDOGREL BISULFATE 75 MG PO TABS
75.0000 mg | ORAL_TABLET | Freq: Every day | ORAL | Status: DC
  Administered 2018-11-29 – 2018-11-30 (×2): 75 mg via ORAL
  Filled 2018-11-29 (×2): qty 1

## 2018-11-29 MED ORDER — SODIUM CHLORIDE 0.9 % WEIGHT BASED INFUSION
3.0000 mL/kg/h | INTRAVENOUS | Status: DC
  Administered 2018-11-29: 3 mL/kg/h via INTRAVENOUS

## 2018-11-29 MED ORDER — AMLODIPINE BESYLATE 5 MG PO TABS
5.0000 mg | ORAL_TABLET | Freq: Every day | ORAL | Status: DC
  Administered 2018-11-29 – 2018-11-30 (×2): 5 mg via ORAL
  Filled 2018-11-29 (×2): qty 1

## 2018-11-29 MED ORDER — MIDAZOLAM HCL 2 MG/2ML IJ SOLN
INTRAMUSCULAR | Status: AC
  Filled 2018-11-29: qty 2

## 2018-11-29 SURGICAL SUPPLY — 10 items
CATH INFINITI 5FR ANG PIGTAIL (CATHETERS) ×1 IMPLANT
CATH INFINITI 5FR JL4 (CATHETERS) ×1 IMPLANT
CATH INFINITI JR4 5F (CATHETERS) ×1 IMPLANT
DEVICE CLOSURE MYNXGRIP 5F (Vascular Products) ×1 IMPLANT
KIT MANI 3VAL PERCEP (MISCELLANEOUS) ×2 IMPLANT
NDL PERC 18GX7CM (NEEDLE) IMPLANT
NEEDLE PERC 18GX7CM (NEEDLE) ×2 IMPLANT
PACK CARDIAC CATH (CUSTOM PROCEDURE TRAY) ×2 IMPLANT
SHEATH AVANTI 5FR X 11CM (SHEATH) ×1 IMPLANT
WIRE GUIDERIGHT .035X150 (WIRE) ×1 IMPLANT

## 2018-11-29 NOTE — Consult Note (Signed)
[facility] Consultation [facility]  Patient ID: Pamela SaGloria P [name], MRN: [id number], DOB/AGE: 82/03/1934 82 y.o. Admit date: 11/28/2018   Date of Consult: 11/29/2018 Primary Physician: Gracelyn NurseJohnston, John D, MD Primary Cardiologist: None  Chief Complaint:  Chief Complaint  Patient presents with  . Chest Pain   Reason for Consult: Infarction  HPI: 84 y.o. female with known apparent previous history of paroxysmal nonvalvular atrial fibrillation essential hypertension mixed hyperlipidemia for which the patient has had new onset central chest discomfort occurring for several hours radiating into her left arm associated with shortness of breath weakness and fatigue.  This was relieved with appropriate medication management and currently the patient is hemodynamically stable.  EKG has shown normal sinus rhythm with first-degree AV block and left bundle branch block.  She does have a troponin of 1.6 consistent with a non-ST elevation myocardial infarction.  Chest x-ray shows normal chest x-ray with no evidence of heart failure.  Currently the patient is improved at this time  Past Medical History:  Diagnosis Date  . A-fib (HCC) 08/28/2014   Overview:  Overview:  Diastolic dysfunction on ECHO 04/2012-[facility]   . Abnormal gait 02/08/2012  . Abscess or cellulitis of leg 04/18/2012  . Absence of bladder continence 07/29/2012  . Acid reflux 02/08/2012  . Acute kidney failure (HCC) 05/15/2012  . Afib, paroxysmal, with RVR at times with BBB 05/13/2012  . Allergic drug rash 05/15/2012  . Anemia 05/13/2012  . 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 Benign essential HTN 02/08/2012  . Bladder infection, chronic 06/29/2013  . BP (high blood pressure) 05/12/2012  . Bursitis of knee 08/07/2014  . Cellulitis in diabetic foot (HCC) 05/12/2012  . CHF (congestive heart failure) (HCC)   . Chronic kidney disease 06/01/2012   Overview:  Overview:   12/2012- Renal US, simple L cyst. UIEP normal   . Clinical depression  08/28/2014  . CN (constipation) 03/17/2012  . Dermatitis due to drug reaction 05/15/2012  . Diabetes mellitus   . Diastolic dysfunction, left ventricle 05/14/2012  . DM (diabetes mellitus) (HCC) 05/12/2012  . Dysrhythmia   . Dysuria   . Edema leg 07/02/2014  . Enthesopathy of knee 08/07/2014  . Excess fluid volume 05/20/2012  . FOM (frequency of micturition) 07/29/2012  . Frank hematuria 09/27/2014  . Gangrene (HCC)   . GERD (gastroesophageal reflux disease)   . Heart disease   . High cholesterol   . HTN (hypertension) 05/12/2012  . Hypercholesterolemia 05/12/2012  . Hypertension   . Incomplete bladder emptying 07/29/2012  . Left bundle branch block (LBBB)   . Open wound of knee, leg (except thigh), and ankle 06/01/2012  . 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   Overview:  Overview:  Several years ago, allergic reaction to sulfa antibiotic Several years ago, allergic reaction to sulfa antibiotic       Surgical History:  Past Surgical History:  Procedure Laterality Date  . ABDOMINAL HYSTERECTOMY    . BLADDER SURGERY    . cataract surgery    . ENDOVENOUS ABLATION SAPHENOUS VEIN W/ LASER    . NASAL SINUS SURGERY    . TOE AMPUTATION    . TONSILLECTOMY       Home Meds: Prior to Admission medications   Medication Sig Start Date End Date Taking? Authorizing Provider  amiodarone (PACERONE) 200 MG tablet Take 200 mg by mouth daily.  12/16/15  Yes [provider]  amitriptyline (ELAVIL) 50 MG tablet Take 50 mg by mouth at bedtime.  03/31/16  Yes [provider]  amLODipine (NORVASC) 5 MG tablet Take 5 mg by mouth daily.   Yes [provider]  atorvastatin (LIPITOR) 40 MG tablet Take 40 mg by mouth daily.   Yes [provider]  insulin lispro protamine-lispro (HUMALOG 75/25 MIX) (75-25) 100 UNIT/ML SUSP injection Inject 20 Units into the skin daily.   Yes [provider]  insulin lispro protamine-lispro (HUMALOG 75/25 MIX) (75-25) 100 UNIT/ML SUSP  injection Inject 18 Units into the skin at bedtime.   Yes [provider]  memantine (NAMENDA XR) 7 MG CP24 24 hr capsule Take 7 mg by mouth daily.   Yes [provider]  metoprolol tartrate (LOPRESSOR) 25 MG tablet Take 25 mg by mouth 2 (two) times daily.   Yes [provider]  pantoprazole (PROTONIX) 40 MG tablet Take 40 mg by mouth daily.   Yes [provider]  vitamin B-12 (CYANOCOBALAMIN) 1000 MCG tablet Take 1,000 mcg by mouth daily.   Yes [provider]  metoprolol (LOPRESSOR) 50 MG tablet Take 1 tablet (50 mg total) by mouth 2 (two) times daily. Patient not taking: Reported on 11/28/2018 05/23/12   Osvaldo Shipper, MD  nitrofurantoin, macrocrystal-monohydrate, (MACROBID) 100 MG capsule Take 1 capsule (100 mg total) by mouth every 12 (twelve) hours. Patient not taking: Reported on 11/28/2018 03/18/18   Michiel Cowboy A, PA-C  NOVOLOG MIX 70/30 FLEXPEN (70-30) 100 UNIT/ML FlexPen Inject 0.12 mLs (12 Units total) into the skin 2 (two) times daily with a meal. Patient not taking: Reported on 11/28/2018 11/30/17   Adrian Saran, MD    Inpatient Medications:  . amiodarone  200 mg Oral Daily  . amLODipine  5 mg Oral Daily  . atorvastatin  40 mg Oral Daily  . insulin aspart  0-9 Units Subcutaneous Q6H  . memantine  7 mg Oral Daily  . [START ON 11/30/2018] metoprolol tartrate  25 mg Oral BID  . pantoprazole  40 mg Oral Daily   . heparin 1,050 Units/hr (11/29/18 0731)    Allergies:  Allergies  Allergen Reactions  . Ciprofloxacin Anaphylaxis  . Sulfamethoxazole-Trimethoprim Other (See Comments) and Rash    Went into [employer].  . Augmentin [Amoxicillin-Pot Clavulanate] Hives  . Hydrochlorothiazide   . Levofloxacin Other (See Comments)  . Losartan Potassium-Hctz Other (See Comments)    Other reaction(s): Unknown  . Nitrofurantoin Other (See Comments)  . Sulfa Antibiotics Other (See Comments)    Levonne Spiller Steven's Johnsons   . Sulfasalazine     unknown  . Levaquin [Levofloxacin In D5w]     Social History   Socioeconomic History  . Marital status: Married    Spouse name: Not on file  . Number of children: Not on file  . Years of education: Not on file  . Highest education level: Not on file  Occupational History  . Not on file  Social Needs  . Financial resource strain: Not on file  . Food insecurity:    Worry: Not on file    Inability: Not on file  . Transportation needs:    Medical: Not on file    Non-medical: Not on file  Tobacco Use  . Smoking status: Never Smoker  . Smokeless tobacco: Never Used  Substance and Sexual Activity  . Alcohol use: No    Alcohol/week: 0.0 standard drinks  . Drug use: No  . Sexual activity: Never  Lifestyle  . Physical activity:    Days per week: Not on file  Minutes per session: Not on file  . Stress: Not on file  Relationships  . Social connections:    Talks on phone: Not on file    Gets together: Not on file    Attends religious service: Not on file    Active member of club or organization: Not on file    Attends meetings of clubs or organizations: Not on file    Relationship status: Not on file  . Intimate partner violence:    Fear of current or ex partner: Not on file    Emotionally abused: Not on file    Physically abused: Not on file    Forced sexual activity: Not on file  Other Topics Concern  . Not on file  Social History Narrative  . Not on file     Family History  Problem Relation Age of Onset  . Diabetes Mellitus II Mother   . Heart disease Mother   . Lung cancer Father   . Diabetes Paternal Grandmother   . Diabetes Maternal Grandmother   . Kidney disease Neg Hx   . Prostate cancer Neg Hx   . Bladder Cancer Neg Hx      Review of Systems Positive for chest pain shortness of breath Negative for: General:  chills, fever, night sweats or weight changes.  Cardiovascular: PND orthopnea syncope dizziness  Dermatological skin  lesions rashes Respiratory: Cough congestion Urologic: Frequent urination urination at night and hematuria Abdominal: negative for nausea, vomiting, diarrhea, bright red blood per rectum, melena, or hematemesis Neurologic: negative for visual changes, and/or hearing changes  All other systems reviewed and are otherwise negative except as noted above.  Labs: Recent Labs    11/28/18 2050 11/29/18 0454  TROPONINI 1.62* 1.69*   Lab Results  Component Value Date   WBC 7.3 11/29/2018   HGB 11.5 (L) 11/29/2018   HCT 35.6 (L) 11/29/2018   MCV 97.8 11/29/2018   PLT 182 11/29/2018    Recent Labs  Lab 11/28/18 2050 11/29/18 0454  NA 137 138  K 3.5 3.7  CL 103 106  CO2 25 24  BUN 35* 34*  CREATININE 1.42* 1.24*  CALCIUM 9.0 8.9  PROT 6.7  --   BILITOT 0.4  --   ALKPHOS 90  --   ALT 18  --   AST 23  --   GLUCOSE 166* 87   Lab Results  Component Value Date   CHOL 119 07/08/2013   HDL 58 07/08/2013   LDLCALC 39 07/08/2013   TRIG 111 07/08/2013   No results found for: DDIMER  Radiology/Studies:  Dg Chest 2 View  Result Date: 11/28/2018 CLINICAL DATA:  Acute chest pain. EXAM: CHEST - 2 VIEW COMPARISON:  04/29/2018 and prior radiographs FINDINGS: The cardiomediastinal silhouette is unremarkable. There is no evidence of focal airspace disease, pulmonary edema, suspicious pulmonary nodule/mass, pleural effusion, or pneumothorax. No acute bony abnormalities are identified. IMPRESSION: No active cardiopulmonary disease. Electronically Signed   By: Harmon PierJeffrey  Hu M.D.   On: 11/28/2018 21:23   Ct Head Wo Contrast  Result Date: 11/25/2018 CLINICAL DATA:  Fall, hit back of head.  Neck pain. EXAM: CT HEAD WITHOUT CONTRAST CT CERVICAL SPINE WITHOUT CONTRAST TECHNIQUE: Multidetector CT imaging of the head and cervical spine was performed following the standard protocol without intravenous contrast. Multiplanar CT image reconstructions of the cervical spine were also generated. COMPARISON:   03/29/2015 FINDINGS: CT HEAD FINDINGS Brain: There is atrophy and chronic small vessel disease changes. No acute intracranial abnormality.  Specifically, no hemorrhage, hydrocephalus, mass lesion, acute infarction, or significant intracranial injury. Vascular: No hyperdense vessel or unexpected calcification. Skull: No acute calvarial abnormality. Sinuses/Orbits: Visualized paranasal sinuses and mastoids clear. Orbital soft tissues unremarkable. Other: None CT CERVICAL SPINE FINDINGS Alignment: Slight anterolisthesis of C6 on C7, 2 mm related to facet disease. Skull base and vertebrae: No acute fracture. No primary bone lesion or focal pathologic process. Soft tissues and spinal canal: No prevertebral fluid or swelling. No visible canal hematoma. Disc levels: Advanced degenerative disc disease at C5-6 and C6-7. Moderate diffuse bilateral degenerative facet disease. Upper chest: No acute findings Other: No acute findings.  Carotid artery calcifications. IMPRESSION: Atrophy, chronic microvascular disease. No acute intracranial abnormality. Degenerative disc and facet disease in the cervical spine. No acute bony abnormality. Electronically Signed   By: Charlett Nose M.D.   On: 11/25/2018 12:02   Ct Cervical Spine Wo Contrast  Result Date: 11/25/2018 CLINICAL DATA:  Fall, hit back of head.  Neck pain. EXAM: CT HEAD WITHOUT CONTRAST CT CERVICAL SPINE WITHOUT CONTRAST TECHNIQUE: Multidetector CT imaging of the head and cervical spine was performed following the standard protocol without intravenous contrast. Multiplanar CT image reconstructions of the cervical spine were also generated. COMPARISON:  03/29/2015 FINDINGS: CT HEAD FINDINGS Brain: There is atrophy and chronic small vessel disease changes. No acute intracranial abnormality. Specifically, no hemorrhage, hydrocephalus, mass lesion, acute infarction, or significant intracranial injury. Vascular: No hyperdense vessel or unexpected calcification. Skull: No acute  calvarial abnormality. Sinuses/Orbits: Visualized paranasal sinuses and mastoids clear. Orbital soft tissues unremarkable. Other: None CT CERVICAL SPINE FINDINGS Alignment: Slight anterolisthesis of C6 on C7, 2 mm related to facet disease. Skull base and vertebrae: No acute fracture. No primary bone lesion or focal pathologic process. Soft tissues and spinal canal: No prevertebral fluid or swelling. No visible canal hematoma. Disc levels: Advanced degenerative disc disease at C5-6 and C6-7. Moderate diffuse bilateral degenerative facet disease. Upper chest: No acute findings Other: No acute findings.  Carotid artery calcifications. IMPRESSION: Atrophy, chronic microvascular disease. No acute intracranial abnormality. Degenerative disc and facet disease in the cervical spine. No acute bony abnormality. Electronically Signed   By: Charlett Nose M.D.   On: 11/25/2018 12:02    EKG: Normal sinus rhythm with first-degree AV block with left bundle branch block  Weights: Filed Weights   11/28/18 2048  Weight: 78 kg     Physical Exam: Blood pressure (!) 150/66, pulse 62, temperature 98 F (36.7 C), temperature source Oral, resp. rate 17, height 5\' 5"  (1.651 m), weight 78 kg, SpO2 96 %. Body mass index is 28.62 kg/m. General: Well developed, well nourished, in no acute distress. Head eyes ears nose throat: Normocephalic, atraumatic, sclera non-icteric, no xanthomas, nares are without discharge. No apparent thyromegaly and/or mass  Lungs: Normal respiratory effort.  no wheezes, no rales, no rhonchi.  Heart: RRR with normal S1 S2. no murmur gallop, no rub, PMI is normal size and placement, carotid upstroke normal without bruit, jugular venous pressure is normal Abdomen: Soft, non-tender, non-distended with normoactive bowel sounds. No hepatomegaly. No rebound/guarding. No obvious abdominal masses. Abdominal aorta is normal size without bruit Extremities: No edema. no cyanosis, no clubbing, no ulcers   Peripheral : 2+ bilateral upper extremity pulses, 2+ bilateral femoral pulses, 2+ bilateral dorsal pedal pulse Neuro: Alert and oriented. No facial asymmetry. No focal deficit. Moves all extremities spontaneously. Musculoskeletal: Normal muscle tone without kyphosis Psych:  Responds to questions appropriately with a normal affect.  Assessment: 84 year old female with essential hypertension mixed hyperlipidemia chronic kidney disease stage III with a non-ST elevation myocardial infarction  Plan: 1.  Continue heparin and nitrates for myocardial infarction and addition of aspirin 2.  Beta-blocker as able for myocardial infarction heart rate and blood pressure control 3.  Echocardiogram for LV systolic dysfunction and further evaluation and treatment options 4.  Proceed to cardiac catheterization to assess coronary anatomy and further treatment thereof is necessary.  Patient understands the risk and benefits of cardiac catheterization.  This includes a possibility of death stroke heart attack infection bleeding or blood clot.  The patient is at low risk for conscious sedation  Signed, Lamar Blinks M.D. [facility] Cardiology 11/29/2018, 8:59 AM

## 2018-11-29 NOTE — ED Notes (Signed)
Cardiologist at the bedside

## 2018-11-29 NOTE — H&P (Signed)
[facility] - [place] at [facility]   PATIENT NAME: Fawn KirkGloria Knudtson    MR#:  [id number]  DATE OF BIRTH:  10/20/1934  DATE OF ADMISSION:  11/28/2018  PRIMARY CARE PHYSICIAN: Gracelyn NurseJohnston, John D, MD   REQUESTING/REFERRING PHYSICIAN: Alphonzo LemmingsMcShane, MD  CHIEF COMPLAINT:   Chief Complaint  Patient presents with  . Chest Pain    HISTORY OF PRESENT ILLNESS:  Fawn KirkGloria Jares  is a 84 y.o. female who presents with chief complaint as above.  Patient reports 2 days of central chest pain without radiation.  She states that this pain occurs sometimes when she touches or pushes on her central chest, but other times on its own.  She states the pain has been somewhat constant.  She denies any associated symptoms such as nausea, lightheadedness, diaphoresis.  She does have some cardiac history.  Today in the ED her troponin was elevated at 1.62.  Hospitalist were called for admission and further evaluation  PAST MEDICAL HISTORY:   Past Medical History:  Diagnosis Date  . A-fib (HCC) 08/28/2014   Overview:  Overview:  Diastolic dysfunction on ECHO 04/2012-done at [facility]   . Abnormal gait 02/08/2012  . Abscess or cellulitis of leg 04/18/2012  . Absence of bladder continence 07/29/2012  . Acid reflux 02/08/2012  . Acute kidney failure (HCC) 05/15/2012  . Afib, paroxysmal, with RVR at times with BBB 05/13/2012  . Allergic drug rash 05/15/2012  . Anemia 05/13/2012  . 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 Benign essential HTN 02/08/2012  . Bladder infection, chronic 06/29/2013  . BP (high blood pressure) 05/12/2012  . Bursitis of knee 08/07/2014  . Cellulitis in diabetic foot (HCC) 05/12/2012  . CHF (congestive heart failure) (HCC)   . Chronic kidney disease 06/01/2012   Overview:  Overview:   12/2012- Renal US, simple L cyst. UIEP normal   . Clinical depression 08/28/2014  . CN (constipation) 03/17/2012  . Dermatitis due to drug reaction 05/15/2012  . Diabetes mellitus   . Diastolic dysfunction,  left ventricle 05/14/2012  . DM (diabetes mellitus) (HCC) 05/12/2012  . Dysrhythmia   . Dysuria   . Edema leg 07/02/2014  . Enthesopathy of knee 08/07/2014  . Excess fluid volume 05/20/2012  . FOM (frequency of micturition) 07/29/2012  . Frank hematuria 09/27/2014  . Gangrene (HCC)   . GERD (gastroesophageal reflux disease)   . Heart disease   . High cholesterol   . HTN (hypertension) 05/12/2012  . Hypercholesterolemia 05/12/2012  . Hypertension   . Incomplete bladder emptying 07/29/2012  . Left bundle branch block (LBBB)   . Open wound of knee, leg (except thigh), and ankle 06/01/2012  . 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   Overview:  Overview:  Several years ago, allergic reaction to sulfa antibiotic Several years ago, allergic reaction to sulfa antibiotic      PAST SURGICAL HISTORY:   Past Surgical History:  Procedure Laterality Date  . ABDOMINAL HYSTERECTOMY    . BLADDER SURGERY    . cataract surgery    . ENDOVENOUS ABLATION SAPHENOUS VEIN W/ LASER    . NASAL SINUS SURGERY    . TOE AMPUTATION    . TONSILLECTOMY       SOCIAL HISTORY:   Social History   Tobacco Use  . Smoking status: Never Smoker  . Smokeless tobacco: Never Used  Substance Use Topics  . Alcohol use: No    Alcohol/week: 0.0 standard drinks     FAMILY HISTORY:   Family History  Problem  Relation Age of Onset  . Diabetes Mellitus II Mother   . Heart disease Mother   . Lung cancer Father   . Diabetes Paternal Grandmother   . Diabetes Maternal Grandmother   . Kidney disease Neg Hx   . Prostate cancer Neg Hx   . Bladder Cancer Neg Hx      DRUG ALLERGIES:   Allergies  Allergen Reactions  . Ciprofloxacin Anaphylaxis  . Sulfamethoxazole-Trimethoprim Other (See Comments) and Rash    Went into [employer] Johnson Sydrome.  . Augmentin [Amoxicillin-Pot Clavulanate] Hives  . Hydrochlorothiazide   . Levofloxacin Other (See Comments)  . Losartan Potassium-Hctz Other (See Comments)    Other  reaction(s): Unknown  . Nitrofurantoin Other (See Comments)  . Sulfa Antibiotics Other (See Comments)    Levonne SpillerStevens johnson Steven's Johnsons  . Sulfasalazine     unknown  . Levaquin [Levofloxacin In D5w]     MEDICATIONS AT HOME:   Prior to Admission medications   Medication Sig Start Date End Date Taking? Authorizing Provider  amiodarone (PACERONE) 200 MG tablet Take 200 mg by mouth daily.  12/16/15  Yes [provider]  amitriptyline (ELAVIL) 50 MG tablet Take 50 mg by mouth at bedtime.  03/31/16  Yes [provider]  amLODipine (NORVASC) 5 MG tablet Take 5 mg by mouth daily.   Yes [provider]  atorvastatin (LIPITOR) 40 MG tablet Take 40 mg by mouth daily.   Yes [provider]  insulin lispro protamine-lispro (HUMALOG 75/25 MIX) (75-25) 100 UNIT/ML SUSP injection Inject 20 Units into the skin daily.   Yes [provider]  insulin lispro protamine-lispro (HUMALOG 75/25 MIX) (75-25) 100 UNIT/ML SUSP injection Inject 18 Units into the skin at bedtime.   Yes [provider]  memantine (NAMENDA XR) 7 MG CP24 24 hr capsule Take 7 mg by mouth daily.   Yes [provider]  metoprolol tartrate (LOPRESSOR) 25 MG tablet Take 25 mg by mouth 2 (two) times daily.   Yes [provider]  pantoprazole (PROTONIX) 40 MG tablet Take 40 mg by mouth daily.   Yes [provider]  vitamin B-12 (CYANOCOBALAMIN) 1000 MCG tablet Take 1,000 mcg by mouth daily.   Yes [provider]  metoprolol (LOPRESSOR) 50 MG tablet Take 1 tablet (50 mg total) by mouth 2 (two) times daily. Patient not taking: Reported on 11/28/2018 05/23/12   Osvaldo ShipperKrishnan, Gokul, MD  nitrofurantoin, macrocrystal-monohydrate, (MACROBID) 100 MG capsule Take 1 capsule (100 mg total) by mouth every 12 (twelve) hours. Patient not taking: Reported on 11/28/2018 03/18/18   Michiel CowboyMcGowan, Shannon A, PA-C  NOVOLOG MIX 70/30 FLEXPEN (70-30) 100 UNIT/ML FlexPen Inject 0.12 mLs (12  Units total) into the skin 2 (two) times daily with a meal. Patient not taking: Reported on 11/28/2018 11/30/17   Adrian SaranMody, Sital, MD    REVIEW OF SYSTEMS:  Review of Systems  Constitutional: Negative for chills, fever, malaise/fatigue and weight loss.  HENT: Negative for ear pain, hearing loss and tinnitus.   Eyes: Negative for blurred vision, double vision, pain and redness.  Respiratory: Negative for cough, hemoptysis and shortness of breath.   Cardiovascular: Positive for chest pain. Negative for palpitations, orthopnea and leg swelling.  Gastrointestinal: Negative for abdominal pain, constipation, diarrhea, nausea and vomiting.  Genitourinary: Negative for dysuria, frequency and hematuria.  Musculoskeletal: Negative for back pain, joint pain and neck pain.  Skin:       No acne, rash, or lesions  Neurological: Negative for dizziness, tremors, focal weakness  and weakness.  Endo/Heme/Allergies: Negative for polydipsia. Does not bruise/bleed easily.  Psychiatric/Behavioral: Negative for depression. The patient is not nervous/anxious and does not have insomnia.      VITAL SIGNS:   Vitals:   11/28/18 2237 11/28/18 2238 11/28/18 2245 11/28/18 2303  BP:    129/63  Pulse: (!) 51 (!) 52 (!) 54 (!) 51  Resp: 15 19 17 16   Temp:      TempSrc:      SpO2: 94% 97% 96% 95%  Weight:      Height:       Wt Readings from Last 3 Encounters:  11/28/18 78 kg  11/25/18 79.4 kg  09/08/18 81.1 kg    PHYSICAL EXAMINATION:  Physical Exam  Vitals reviewed. Constitutional: She is oriented to person, place, and time. She appears well-developed and well-nourished. No distress.  HENT:  Head: Normocephalic and atraumatic.  Mouth/Throat: Oropharynx is clear and moist.  Eyes: Pupils are equal, round, and reactive to light. Conjunctivae and EOM are normal. No scleral icterus.  Neck: Normal range of motion. Neck supple. No JVD present. No thyromegaly present.  Cardiovascular: Normal rate, regular rhythm and  intact distal pulses. Exam reveals no gallop and no friction rub.  No murmur heard. Respiratory: Effort normal and breath sounds normal. No respiratory distress. She has no wheezes. She has no rales.  GI: Soft. Bowel sounds are normal. She exhibits no distension. There is no abdominal tenderness.  Musculoskeletal: Normal range of motion.        General: No edema.     Comments: No arthritis, no gout  Lymphadenopathy:    She has no cervical adenopathy.  Neurological: She is alert and oriented to person, place, and time. No cranial nerve deficit.  No dysarthria, no aphasia  Skin: Skin is warm and dry. No rash noted. No erythema.  Psychiatric: She has a normal mood and affect. Her behavior is normal. Judgment and thought content normal.    LABORATORY PANEL:   CBC Recent Labs  Lab 11/28/18 2050  WBC 7.6  HGB 12.0  HCT 37.0  PLT 202   ------------------------------------------------------------------------------------------------------------------  Chemistries  Recent Labs  Lab 11/28/18 2050  NA 137  K 3.5  CL 103  CO2 25  GLUCOSE 166*  BUN 35*  CREATININE 1.42*  CALCIUM 9.0  AST 23  ALT 18  ALKPHOS 90  BILITOT 0.4   ------------------------------------------------------------------------------------------------------------------  Cardiac Enzymes Recent Labs  Lab 11/28/18 2050  TROPONINI 1.62*   ------------------------------------------------------------------------------------------------------------------  RADIOLOGY:  Dg Chest 2 View  Result Date: 11/28/2018 CLINICAL DATA:  Acute chest pain. EXAM: CHEST - 2 VIEW COMPARISON:  04/29/2018 and prior radiographs FINDINGS: The cardiomediastinal silhouette is unremarkable. There is no evidence of focal airspace disease, pulmonary edema, suspicious pulmonary nodule/mass, pleural effusion, or pneumothorax. No acute bony abnormalities are identified. IMPRESSION: No active cardiopulmonary disease. Electronically Signed    By: Harmon Pier M.D.   On: 11/28/2018 21:23    EKG:   Orders placed or performed during the hospital encounter of 11/28/18  . EKG 12-Lead  . EKG 12-Lead    IMPRESSION AND PLAN:  Principal Problem:   NSTEMI (non-ST elevated myocardial infarction) (HCC) -[place] pain-free, heparin drip started in the ED, we will trend her cardiac enzymes and get an echocardiogram with a cardiology consult Active Problems:   HTN (hypertension) -home dose antihypertensives   PAF (paroxysmal atrial fibrillation) (HCC) -continue home dose rate controlling medication   Diabetes mellitus (HCC) -sliding scale insulin coverage   Hypercholesterolemia -  Home dose antilipid   GERD (gastroesophageal reflux disease) -home dose PPI  Chart review performed and case discussed with ED provider. Labs, imaging and/or ECG reviewed by provider and discussed with patient/family. Management plans discussed with the patient and/or family.  DVT PROPHYLAXIS: Systemic anticoagulation  GI PROPHYLAXIS:  PPI   ADMISSION STATUS: Inpatient     CODE STATUS: Full Code Status History    Date Active Date Inactive Code Status Order ID Comments User Context   11/28/2017 2109 11/30/2017 1923 Full Code [id number]  Bertrum Sol, MD Inpatient   05/13/2012 0218 05/23/2012 1920 Full Code [id number]  [facility]    Advance Directive Documentation     Most Recent Value  Type of Advance Directive  Living will  Pre-existing out of facility DNR order (yellow form or pink MOST form)  -  "MOST" Form in Place?  -      TOTAL TIME TAKING CARE OF THIS PATIENT: 45 minutes.   Anne Hahn, Marwa Fuhrman FIELDING 11/29/2018, 12:16 AM  [facility]  Office  [phone]  CC: Primary care physician; Gracelyn Nurse, MD  Note:  This document was prepared using Dragon voice recognition software and may include unintentional dictation errors.

## 2018-11-29 NOTE — Progress Notes (Signed)
Advanced Care Plan.  Purpose of Encounter: CODE STATUS. Parties in Attendance: The patient and me. Patient's Decisional Capacity: Yes. Medical Story: Fawn KirkGloria Wernick  is a 84 y.o. female  with multiple medical problems including A. fib, hypertension, hyperlipidemia, CHF, CKD, diabetes, etc.  The patient is admitted for non-STEMI, waiting for cardiac cath.  I discussed with the patient about her current condition, prognosis and CODE STATUS.  The patient wants to be resuscitated and intubated if she has cardiopulmonary arrest. Plan:  Code Status: Full code. Time spent discussing advance care planning: 17 minutes.

## 2018-11-29 NOTE — Progress Notes (Signed)
ANTICOAGULATION CONSULT NOTE - Initial Consult  Pharmacy Consult for heparin drip Indication: chest pain/ACS  Allergies  Allergen Reactions  . Ciprofloxacin Anaphylaxis  . Sulfamethoxazole-Trimethoprim Other (See Comments) and Rash    Went into [employer] Johnson Sydrome.  . Augmentin [Amoxicillin-Pot Clavulanate] Hives  . Hydrochlorothiazide   . Levofloxacin Other (See Comments)  . Losartan Potassium-Hctz Other (See Comments)    Other reaction(s): Unknown  . Nitrofurantoin Other (See Comments)  . Sulfa Antibiotics Other (See Comments)    Levonne SpillerStevens johnson Steven's Johnsons  . Sulfasalazine     unknown  . Levaquin [Levofloxacin In D5w]     Patient Measurements: Height: 5\' 5"  (165.1 cm) Weight: 172 lb (78 kg) IBW/kg (Calculated) : 57 Heparin Dosing Weight: 73 kg  Vital Signs: Temp: 98 F (36.7 C) (12/30 2049) Temp Source: Oral (12/30 2049) BP: 129/63 (12/30 2303) Pulse Rate: 51 (12/30 2303)  Labs: Recent Labs    11/28/18 2050  HGB 12.0  HCT 37.0  PLT 202  APTT 28  LABPROT 13.8  INR 1.07  CREATININE 1.42*  TROPONINI 1.62*    Estimated Creatinine Clearance: 31.5 mL/min (A) (by C-G formula based on SCr of 1.42 mg/dL (H)).   Medical History: Past Medical History:  Diagnosis Date  . A-fib (HCC) 08/28/2014   Overview:  Overview:  Diastolic dysfunction on ECHO 04/2012-[facility]   . Abnormal gait 02/08/2012  . Abscess or cellulitis of leg 04/18/2012  . Absence of bladder continence 07/29/2012  . Acid reflux 02/08/2012  . Acute kidney failure (HCC) 05/15/2012  . Afib, paroxysmal, with RVR at times with BBB 05/13/2012  . Allergic drug rash 05/15/2012  . Anemia 05/13/2012  . 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 Benign essential HTN 02/08/2012  . Bladder infection, chronic 06/29/2013  . BP (high blood pressure) 05/12/2012  . Bursitis of knee 08/07/2014  . Cellulitis in diabetic foot (HCC) 05/12/2012  . CHF (congestive heart failure) (HCC)   . Chronic kidney disease 06/01/2012    Overview:  Overview:   12/2012- Renal US, simple L cyst. UIEP normal   . Clinical depression 08/28/2014  . CN (constipation) 03/17/2012  . Dermatitis due to drug reaction 05/15/2012  . Diabetes mellitus   . Diastolic dysfunction, left ventricle 05/14/2012  . DM (diabetes mellitus) (HCC) 05/12/2012  . Dysrhythmia   . Dysuria   . Edema leg 07/02/2014  . Enthesopathy of knee 08/07/2014  . Excess fluid volume 05/20/2012  . FOM (frequency of micturition) 07/29/2012  . Frank hematuria 09/27/2014  . Gangrene (HCC)   . GERD (gastroesophageal reflux disease)   . Heart disease   . High cholesterol   . HTN (hypertension) 05/12/2012  . Hypercholesterolemia 05/12/2012  . Hypertension   . Incomplete bladder emptying 07/29/2012  . Left bundle branch block (LBBB)   . Open wound of knee, leg (except thigh), and ankle 06/01/2012  . 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   Overview:  Overview:  Several years ago, allergic reaction to sulfa antibiotic Several years ago, allergic reaction to sulfa antibiotic     Medications:  Scheduled:    Assessment: Patient admitted for CP w/ initial trops of 1.62. No PTA anticoagulation. EKG shows LBBB Patient is being started on heparin drip for NSTEMI  Goal of Therapy:  Heparin level 0.3-0.7 units/ml Monitor platelets by anticoagulation protocol: Yes   Plan:  Will bolus w/ heparin 4000 units IV x 1 Will start rate at 1200 units/hr and will check anti-Xa @ 0500 Baseline labs ordered Will monitor daily  CBC's and adjust per anti-Xa levels.  Thomasene Rippleavid Lakishia Bourassa, PharmD, BCPS Clinical Pharmacist 11/29/2018

## 2018-11-29 NOTE — Progress Notes (Signed)
[facility]P cath, R groin looks clean, dry and intact, no bleeding, no hematoma. Received verbal orders from Dr. Gwen PoundsKowalski to d/c heparin gtt. Oriented to room, call bell, and staff. Bed in lowest position. Fall safety plan reviewed. Full assessment to Epic. Skin assessment verified with Loree Feeaylor RN. Telemetry box verification with tele clerk- Box#: 27. Will continue to monitor.

## 2018-11-29 NOTE — Progress Notes (Signed)
ANTICOAGULATION CONSULT NOTE - Initial Consult  Pharmacy Consult for heparin drip Indication: chest pain/ACS  Allergies  Allergen Reactions  . Ciprofloxacin Anaphylaxis  . Sulfamethoxazole-Trimethoprim Other (See Comments) and Rash    Went into [employer] Johnson Sydrome.  . Augmentin [Amoxicillin-Pot Clavulanate] Hives  . Hydrochlorothiazide   . Levofloxacin Other (See Comments)  . Losartan Potassium-Hctz Other (See Comments)    Other reaction(s): Unknown  . Nitrofurantoin Other (See Comments)  . Sulfa Antibiotics Other (See Comments)    Levonne SpillerStevens johnson Steven's Johnsons  . Sulfasalazine     unknown  . Levaquin [Levofloxacin In D5w]     Patient Measurements: Height: 5\' 5"  (165.1 cm) Weight: 172 lb (78 kg) IBW/kg (Calculated) : 57 Heparin Dosing Weight: 73 kg  Vital Signs: Temp: 98 F (36.7 C) (12/30 2049) Temp Source: Oral (12/30 2049) BP: 166/72 (12/31 0600) Pulse Rate: 61 (12/31 0600)  Labs: Recent Labs    11/28/18 2050 11/29/18 0454  HGB 12.0 11.5*  HCT 37.0 35.6*  PLT 202 182  APTT 28  --   LABPROT 13.8  --   INR 1.07  --   HEPARINUNFRC  --  1.04*  CREATININE 1.42* 1.24*  TROPONINI 1.62* 1.69*    Estimated Creatinine Clearance: 36.1 mL/min (A) (by C-G formula based on SCr of 1.24 mg/dL (H)).   Medical History: Past Medical History:  Diagnosis Date  . A-fib (HCC) 08/28/2014   Overview:  Overview:  Diastolic dysfunction on ECHO 04/2012-[facility]   . Abnormal gait 02/08/2012  . Abscess or cellulitis of leg 04/18/2012  . Absence of bladder continence 07/29/2012  . Acid reflux 02/08/2012  . Acute kidney failure (HCC) 05/15/2012  . Afib, paroxysmal, with RVR at times with BBB 05/13/2012  . Allergic drug rash 05/15/2012  . Anemia 05/13/2012  . 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 Benign essential HTN 02/08/2012  . Bladder infection, chronic 06/29/2013  . BP (high blood pressure) 05/12/2012  . Bursitis of knee 08/07/2014  . Cellulitis in diabetic foot (HCC)  05/12/2012  . CHF (congestive heart failure) (HCC)   . Chronic kidney disease 06/01/2012   Overview:  Overview:   12/2012- Renal US, simple L cyst. UIEP normal   . Clinical depression 08/28/2014  . CN (constipation) 03/17/2012  . Dermatitis due to drug reaction 05/15/2012  . Diabetes mellitus   . Diastolic dysfunction, left ventricle 05/14/2012  . DM (diabetes mellitus) (HCC) 05/12/2012  . Dysrhythmia   . Dysuria   . Edema leg 07/02/2014  . Enthesopathy of knee 08/07/2014  . Excess fluid volume 05/20/2012  . FOM (frequency of micturition) 07/29/2012  . Frank hematuria 09/27/2014  . Gangrene (HCC)   . GERD (gastroesophageal reflux disease)   . Heart disease   . High cholesterol   . HTN (hypertension) 05/12/2012  . Hypercholesterolemia 05/12/2012  . Hypertension   . Incomplete bladder emptying 07/29/2012  . Left bundle branch block (LBBB)   . Open wound of knee, leg (except thigh), and ankle 06/01/2012  . 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   Overview:  Overview:  Several years ago, allergic reaction to sulfa antibiotic Several years ago, allergic reaction to sulfa antibiotic     Medications:  Scheduled:  . amiodarone  200 mg Oral Daily  . amLODipine  5 mg Oral Daily  . atorvastatin  40 mg Oral Daily  . insulin aspart  0-9 Units Subcutaneous Q6H  . memantine  7 mg Oral Daily  . [START ON 11/30/2018] metoprolol tartrate  25 mg Oral  BID  . pantoprazole  40 mg Oral Daily    Assessment: Patient admitted for CP w/ initial trops of 1.62. No PTA anticoagulation. EKG shows LBBB Patient is being started on heparin drip for NSTEMI  Goal of Therapy:  Heparin level 0.3-0.7 units/ml Monitor platelets by anticoagulation protocol: Yes   Plan:  12/31 @ 0500 HL 1.04 supratherapeutic. Will hold heparin drip for 1 hour and will restart at 1050 units/hr and will recheck HL @ 1530. hgb down to 11.5 from 12 but stable, will continue to monitor.  Thomasene Rippleavid Parys Elenbaas, PharmD, BCPS Clinical  Pharmacist 11/29/2018

## 2018-11-29 NOTE — Progress Notes (Signed)
See the patient in the cardiac Cath Lab.  The patient has no complaints of chest pain or shortness of breath. Vital sign is stable.  Labs reviewed. Non-STEMI.  Continue current treatment.  Follow cardiologist recommendation after cardiac cath.

## 2018-11-29 NOTE — ED Notes (Signed)
Pt moved to hospital bed and positioned for comfort, instructed on how to use equipment.

## 2018-11-29 NOTE — ED Notes (Signed)
Report given to special procedures RN

## 2018-11-29 NOTE — ED Notes (Addendum)
Per Onalee Huaavid from Pharmacy stop heparin drip and will resume at 0730 at lower rate.

## 2018-11-29 NOTE — Progress Notes (Signed)
[facility]  Patient: Pamela SaGloria P [name] / Admit Date: 11/28/2018 / Date of Encounter: 11/29/2018, 2:35 PM   Subjective: Patient feels much better since admission to the hospital.  Troponin peaked at 1.6 consistent with a non-ST elevation myocardial infarction with an EKG showing new left bundle branch block.  The patient has chronic kidney disease as well stable.  No further chest discomfort Cardiac catheterization showing normal LV systolic function with ejection fraction of 50% Moderate coronary atherosclerosis with a 55% stenosis of left anterior descending artery  Review of Systems: Positive for: This of breath weakness Negative for: Vision change, hearing change, syncope, dizziness, nausea, vomiting,diarrhea, bloody stool, stomach pain, cough, congestion, diaphoresis, urinary frequency, urinary pain,skin lesions, skin rashes Others previously listed  Objective: Telemetry: Normal sinus rhythm Physical Exam: Blood pressure (!) 150/67, pulse 69, temperature 98.6 F (37 C), temperature source Oral, resp. rate 20, height 5\' 5"  (1.651 m), weight 78 kg, SpO2 94 %. Body mass index is 28.62 kg/m. General: Well developed, well nourished, in no acute distress. Head: Normocephalic, atraumatic, sclera non-icteric, no xanthomas, nares are without discharge. Neck: No apparent masses Lungs: Normal respirations with no wheezes, no rhonchi, no rales , no crackles   Heart: Regular rate and rhythm, normal S1 S2, no murmur, no rub, no gallop, PMI is normal size and placement, carotid upstroke normal without bruit, jugular venous pressure normal Abdomen: Soft, non-tender, non-distended with normoactive bowel sounds. No hepatosplenomegaly. Abdominal aorta is normal size without bruit Extremities: Trace edema, no clubbing, no cyanosis, no ulcers,  Peripheral: 2+ radial, 2+ femoral, 2+ dorsal pedal pulses Neuro: Alert and oriented. Moves all extremities  spontaneously. Psych:  Responds to questions appropriately with a normal affect.   Intake/Output Summary (Last 24 hours) at 11/29/2018 1435 Last data filed at 11/29/2018 0903 Gross per 24 hour  Intake 127.17 ml  Output 600 ml  Net -472.83 ml    Inpatient Medications:  . [MAR Hold] amiodarone  200 mg Oral Daily  . [MAR Hold] amLODipine  5 mg Oral Daily  . [START ON 11/30/2018] aspirin  81 mg Oral Pre-Cath  . [MAR Hold] atorvastatin  40 mg Oral Daily  . [MAR Hold] insulin aspart  0-9 Units Subcutaneous Q6H  . [MAR Hold] memantine  7 mg Oral Daily  . [MAR Hold] metoprolol tartrate  25 mg Oral BID  . [MAR Hold] pantoprazole  40 mg Oral Daily  . sodium chloride flush  3 mL Intravenous Q12H   Infusions:  . sodium chloride    . [START ON 11/30/2018] sodium chloride 3 mL/kg/hr (11/29/18 1320)   Followed by  . [START ON 11/30/2018] sodium chloride    . heparin Stopped (11/29/18 1328)    Labs: Recent Labs    11/28/18 2050 11/29/18 0454  NA 137 138  K 3.5 3.7  CL 103 106  CO2 25 24  GLUCOSE 166* 87  BUN 35* 34*  CREATININE 1.42* 1.24*  CALCIUM 9.0 8.9   Recent Labs    11/28/18 2050  AST 23  ALT 18  ALKPHOS 90  BILITOT 0.4  PROT 6.7  ALBUMIN 3.3*   Recent Labs    11/28/18 2050 11/29/18 0454  WBC 7.6 7.3  NEUTROABS 5.4  --   HGB 12.0 11.5*  HCT 37.0 35.6*  MCV 98.7 97.8  PLT 202 182   Recent Labs    11/28/18 2050 11/29/18 0454 11/29/18 1132  TROPONINI 1.62* 1.69* 1.50*   Invalid input(s): POCBNP No results for  input(s): HGBA1C in the last 72 hours.   Weights: Filed Weights   11/28/18 2048  Weight: 78 kg     Radiology/Studies:  Dg Chest 2 View  Result Date: 11/28/2018 CLINICAL DATA:  Acute chest pain. EXAM: CHEST - 2 VIEW COMPARISON:  04/29/2018 and prior radiographs FINDINGS: The cardiomediastinal silhouette is unremarkable. There is no evidence of focal airspace disease, pulmonary edema, suspicious pulmonary nodule/mass, pleural effusion, or  pneumothorax. No acute bony abnormalities are identified. IMPRESSION: No active cardiopulmonary disease. Electronically Signed   By: Harmon PierJeffrey  Hu M.D.   On: 11/28/2018 21:23   Ct Head Wo Contrast  Result Date: 11/25/2018 CLINICAL DATA:  Fall, hit back of head.  Neck pain. EXAM: CT HEAD WITHOUT CONTRAST CT CERVICAL SPINE WITHOUT CONTRAST TECHNIQUE: Multidetector CT imaging of the head and cervical spine was performed following the standard protocol without intravenous contrast. Multiplanar CT image reconstructions of the cervical spine were also generated. COMPARISON:  03/29/2015 FINDINGS: CT HEAD FINDINGS Brain: There is atrophy and chronic small vessel disease changes. No acute intracranial abnormality. Specifically, no hemorrhage, hydrocephalus, mass lesion, acute infarction, or significant intracranial injury. Vascular: No hyperdense vessel or unexpected calcification. Skull: No acute calvarial abnormality. Sinuses/Orbits: Visualized paranasal sinuses and mastoids clear. Orbital soft tissues unremarkable. Other: None CT CERVICAL SPINE FINDINGS Alignment: Slight anterolisthesis of C6 on C7, 2 mm related to facet disease. Skull base and vertebrae: No acute fracture. No primary bone lesion or focal pathologic process. Soft tissues and spinal canal: No prevertebral fluid or swelling. No visible canal hematoma. Disc levels: Advanced degenerative disc disease at C5-6 and C6-7. Moderate diffuse bilateral degenerative facet disease. Upper chest: No acute findings Other: No acute findings.  Carotid artery calcifications. IMPRESSION: Atrophy, chronic microvascular disease. No acute intracranial abnormality. Degenerative disc and facet disease in the cervical spine. No acute bony abnormality. Electronically Signed   By: Charlett NoseKevin  Dover M.D.   On: 11/25/2018 12:02   Ct Cervical Spine Wo Contrast  Result Date: 11/25/2018 CLINICAL DATA:  Fall, hit back of head.  Neck pain. EXAM: CT HEAD WITHOUT CONTRAST CT CERVICAL SPINE  WITHOUT CONTRAST TECHNIQUE: Multidetector CT imaging of the head and cervical spine was performed following the standard protocol without intravenous contrast. Multiplanar CT image reconstructions of the cervical spine were also generated. COMPARISON:  03/29/2015 FINDINGS: CT HEAD FINDINGS Brain: There is atrophy and chronic small vessel disease changes. No acute intracranial abnormality. Specifically, no hemorrhage, hydrocephalus, mass lesion, acute infarction, or significant intracranial injury. Vascular: No hyperdense vessel or unexpected calcification. Skull: No acute calvarial abnormality. Sinuses/Orbits: Visualized paranasal sinuses and mastoids clear. Orbital soft tissues unremarkable. Other: None CT CERVICAL SPINE FINDINGS Alignment: Slight anterolisthesis of C6 on C7, 2 mm related to facet disease. Skull base and vertebrae: No acute fracture. No primary bone lesion or focal pathologic process. Soft tissues and spinal canal: No prevertebral fluid or swelling. No visible canal hematoma. Disc levels: Advanced degenerative disc disease at C5-6 and C6-7. Moderate diffuse bilateral degenerative facet disease. Upper chest: No acute findings Other: No acute findings.  Carotid artery calcifications. IMPRESSION: Atrophy, chronic microvascular disease. No acute intracranial abnormality. Degenerative disc and facet disease in the cervical spine. No acute bony abnormality. Electronically Signed   By: Charlett NoseKevin  Dover M.D.   On: 11/25/2018 12:02     Assessment and Recommendation  84 y.o. female with chronic kidney disease essential hypertension mixed hyperlipidemia with a non-ST elevation myocardial infarction and abnormal EKG Cardiac catheterization showing normal LV systolic function with ejection fraction  of 50% and moderate noncritical coronary atherosclerosis 1.  Continue medication management for non-ST elevation myocardial infarction including aspirin and Plavix as able 2.  Beta-blocker for myocardial  infarction 3.  Hypertension control with medication management with a goal systolic pressure of 130 mm 4.  Cardiac rehabilitation 5.  No further cardiac intervention at this time  Signed, Arnoldo Hooker M.D. FACC

## 2018-11-29 NOTE — ED Notes (Signed)
RN from specials states to hold pt until she clarifies something first, will continue to monitor the pt.

## 2018-11-30 LAB — BASIC METABOLIC PANEL
Anion gap: 8 (ref 5–15)
BUN: 28 mg/dL — ABNORMAL HIGH (ref 8–23)
CO2: 22 mmol/L (ref 22–32)
Calcium: 8.6 mg/dL — ABNORMAL LOW (ref 8.9–10.3)
Chloride: 106 mmol/L (ref 98–111)
Creatinine, Ser: 1.37 mg/dL — ABNORMAL HIGH (ref 0.44–1.00)
GFR calc Af Amer: 42 mL/min — ABNORMAL LOW (ref >60)
GFR calc non Af Amer: 36 mL/min — ABNORMAL LOW (ref >60)
Glucose, Bld: 158 mg/dL — ABNORMAL HIGH (ref 70–99)
Potassium: 4.1 mmol/L (ref 3.5–5.1)
Sodium: 136 mmol/L (ref 135–145)

## 2018-11-30 LAB — ECHOCARDIOGRAM COMPLETE
Height: 65 in
WEIGHTICAEL: 2752 [oz_av]

## 2018-11-30 LAB — HEMOGLOBIN: Hemoglobin: 11 g/dL — ABNORMAL LOW (ref 12.0–15.0)

## 2018-11-30 LAB — GLUCOSE, CAPILLARY
Glucose-Capillary: 162 mg/dL — ABNORMAL HIGH (ref 70–99)
Glucose-Capillary: 156 mg/dL — ABNORMAL HIGH (ref 70–99)
Glucose-Capillary: 213 mg/dL — ABNORMAL HIGH (ref 70–99)
Glucose-Capillary: 162 mg/dL — ABNORMAL HIGH (ref 70–99)

## 2018-11-30 LAB — MAGNESIUM: Magnesium: 2 mg/dL (ref 1.7–2.4)

## 2018-11-30 MED ORDER — DOCUSATE SODIUM 100 MG PO CAPS
100.0000 mg | ORAL_CAPSULE | Freq: Two times a day (BID) | ORAL | Status: DC
  Administered 2018-11-30: 100 mg via ORAL
  Filled 2018-11-30: qty 1

## 2018-11-30 MED ORDER — CLOPIDOGREL BISULFATE 75 MG PO TABS: 75.0000 mg | ORAL_TABLET | Freq: Every day | ORAL | 2 refills | Status: DC

## 2018-11-30 MED ORDER — BISACODYL 5 MG PO TBEC: 5.0000 mg | DELAYED_RELEASE_TABLET | Freq: Every day | ORAL | 0 refills | Status: DC | PRN

## 2018-11-30 MED ORDER — BISACODYL 10 MG RE SUPP
10.0000 mg | Freq: Once | RECTAL | Status: AC
  Administered 2018-11-30: 10 mg via RECTAL
  Filled 2018-11-30: qty 1

## 2018-11-30 MED ORDER — NITROGLYCERIN 0.4 MG SL SUBL: 0.4000 mg | SUBLINGUAL_TABLET | SUBLINGUAL | 2 refills | Status: DC | PRN

## 2018-11-30 NOTE — Clinical Social Work Note (Addendum)
Patient to be d/c'ed today to [facility].  Patient and family agreeable to plans will transport via ems RN to call report to [place] [phone].  CSW spoke to patient's son and he is aware that patient is discharging today, and he is requesting EMS transport, due to not being able to transport patient.  CSW informed patient's son that her insurance will be billed.  CSW spoke with Misty Stanley at [facility] and she requested that discharge summary and FL2 be faxed to [phone], CSW faxed requested information.  Windell Moulding, MSW, Theresia Majors [phone]

## 2018-11-30 NOTE — Progress Notes (Signed)
[facility] Cardiology [facility] Encounter Note  Patient: Pamela [name] / Admit Date: 11/28/2018 / Date of Encounter: 11/30/2018, 7:56 AM   Subjective: Patient feels much better since admission to the hospital.  Troponin peaked at 1.6 consistent with a non-ST elevation myocardial infarction with an EKG showing new left bundle branch block.  The patient has chronic kidney disease as well stable.  No further chest discomfort and/or heart failure type symptoms.  Patient has been ambulating minimally Cardiac catheterization showing normal LV systolic function with ejection fraction of 50% Moderate coronary atherosclerosis with a 55% stenosis of left anterior descending artery  Review of Systems: Positive for: None Negative for: Vision change, hearing change, syncope, dizziness, nausea, vomiting,diarrhea, bloody stool, stomach pain, cough, congestion, diaphoresis, urinary frequency, urinary pain,skin lesions, skin rashes Others previously listed  Objective: Telemetry: Normal sinus rhythm Physical Exam: Blood pressure (!) 119/54, pulse 79, temperature 98.9 F (37.2 C), temperature source Oral, resp. rate 18, height 5\' 5"  (1.651 m), weight 83.5 kg, SpO2 93 %. Body mass index is 30.64 kg/m. General: Well developed, well nourished, in no acute distress. Head: Normocephalic, atraumatic, sclera non-icteric, no xanthomas, nares are without discharge. Neck: No apparent masses Lungs: Normal respirations with no wheezes, no rhonchi, no rales , no crackles   Heart: Regular rate and rhythm, normal S1 S2, no murmur, no rub, no gallop, PMI is normal size and placement, carotid upstroke normal without bruit, jugular venous pressure normal Abdomen: Soft, non-tender, non-distended with normoactive bowel sounds. No hepatosplenomegaly. Abdominal aorta is normal size without bruit Extremities: Trace edema, no clubbing, no cyanosis, no ulcers,  Peripheral: 2+ radial, 2+ femoral, 2+ dorsal pedal pulses Neuro:  Alert and oriented. Moves all extremities spontaneously. Psych:  Responds to questions appropriately with a normal affect.   Intake/Output Summary (Last 24 hours) at 11/30/2018 0756 Last data filed at 11/30/2018 0300 Gross per 24 hour  Intake 850.2 ml  Output 1875 ml  Net -1024.8 ml    Inpatient Medications:  . amiodarone  200 mg Oral Daily  . amLODipine  5 mg Oral Daily  . atorvastatin  40 mg Oral Daily  . clopidogrel  75 mg Oral Daily  . insulin aspart  0-9 Units Subcutaneous Q6H  . memantine  7 mg Oral Daily  . metoprolol tartrate  25 mg Oral BID  . pantoprazole  40 mg Oral Daily   Infusions:    Labs: Recent Labs    11/29/18 0454 11/30/18 0406  NA 138 136  K 3.7 4.1  CL 106 106  CO2 24 22  GLUCOSE 87 158*  BUN 34* 28*  CREATININE 1.24* 1.37*  CALCIUM 8.9 8.6*  MG  --  2.0   Recent Labs    11/28/18 2050  AST 23  ALT 18  ALKPHOS 90  BILITOT 0.4  PROT 6.7  ALBUMIN 3.3*   Recent Labs    11/28/18 2050 11/29/18 0454 11/30/18 0406  WBC 7.6 7.3  --   NEUTROABS 5.4  --   --   HGB 12.0 11.5* 11.0*  HCT 37.0 35.6*  --   MCV 98.7 97.8  --   PLT 202 182  --    Recent Labs    11/28/18 2050 11/29/18 0454 11/29/18 1132 11/29/18 1648  TROPONINI 1.62* 1.69* 1.50* 1.30*   Invalid input(s): POCBNP No results for input(s): HGBA1C in the last 72 hours.   Weights: Filed Weights   11/28/18 2048 11/30/18 0437  Weight: 78 kg 83.5 kg  Radiology/Studies:  Dg Chest 2 View  Result Date: 11/28/2018 CLINICAL DATA:  Acute chest pain. EXAM: CHEST - 2 VIEW COMPARISON:  04/29/2018 and prior radiographs FINDINGS: The cardiomediastinal silhouette is unremarkable. There is no evidence of focal airspace disease, pulmonary edema, suspicious pulmonary nodule/mass, pleural effusion, or pneumothorax. No acute bony abnormalities are identified. IMPRESSION: No active cardiopulmonary disease. Electronically Signed   By: Harmon Pier M.D.   On: 11/28/2018 21:23   Ct Head Wo  Contrast  Result Date: 11/25/2018 CLINICAL DATA:  Fall, hit back of head.  Neck pain. EXAM: CT HEAD WITHOUT CONTRAST CT CERVICAL SPINE WITHOUT CONTRAST TECHNIQUE: Multidetector CT imaging of the head and cervical spine was performed following the standard protocol without intravenous contrast. Multiplanar CT image reconstructions of the cervical spine were also generated. COMPARISON:  03/29/2015 FINDINGS: CT HEAD FINDINGS Brain: There is atrophy and chronic small vessel disease changes. No acute intracranial abnormality. Specifically, no hemorrhage, hydrocephalus, mass lesion, acute infarction, or significant intracranial injury. Vascular: No hyperdense vessel or unexpected calcification. Skull: No acute calvarial abnormality. Sinuses/Orbits: Visualized paranasal sinuses and mastoids clear. Orbital soft tissues unremarkable. Other: None CT CERVICAL SPINE FINDINGS Alignment: Slight anterolisthesis of C6 on C7, 2 mm related to facet disease. Skull base and vertebrae: No acute fracture. No primary bone lesion or focal pathologic process. Soft tissues and spinal canal: No prevertebral fluid or swelling. No visible canal hematoma. Disc levels: Advanced degenerative disc disease at C5-6 and C6-7. Moderate diffuse bilateral degenerative facet disease. Upper chest: No acute findings Other: No acute findings.  Carotid artery calcifications. IMPRESSION: Atrophy, chronic microvascular disease. No acute intracranial abnormality. Degenerative disc and facet disease in the cervical spine. No acute bony abnormality. Electronically Signed   By: Charlett Nose M.D.   On: 11/25/2018 12:02   Ct Cervical Spine Wo Contrast  Result Date: 11/25/2018 CLINICAL DATA:  Fall, hit back of head.  Neck pain. EXAM: CT HEAD WITHOUT CONTRAST CT CERVICAL SPINE WITHOUT CONTRAST TECHNIQUE: Multidetector CT imaging of the head and cervical spine was performed following the standard protocol without intravenous contrast. Multiplanar CT image  reconstructions of the cervical spine were also generated. COMPARISON:  03/29/2015 FINDINGS: CT HEAD FINDINGS Brain: There is atrophy and chronic small vessel disease changes. No acute intracranial abnormality. Specifically, no hemorrhage, hydrocephalus, mass lesion, acute infarction, or significant intracranial injury. Vascular: No hyperdense vessel or unexpected calcification. Skull: No acute calvarial abnormality. Sinuses/Orbits: Visualized paranasal sinuses and mastoids clear. Orbital soft tissues unremarkable. Other: None CT CERVICAL SPINE FINDINGS Alignment: Slight anterolisthesis of C6 on C7, 2 mm related to facet disease. Skull base and vertebrae: No acute fracture. No primary bone lesion or focal pathologic process. Soft tissues and spinal canal: No prevertebral fluid or swelling. No visible canal hematoma. Disc levels: Advanced degenerative disc disease at C5-6 and C6-7. Moderate diffuse bilateral degenerative facet disease. Upper chest: No acute findings Other: No acute findings.  Carotid artery calcifications. IMPRESSION: Atrophy, chronic microvascular disease. No acute intracranial abnormality. Degenerative disc and facet disease in the cervical spine. No acute bony abnormality. Electronically Signed   By: Charlett Nose M.D.   On: 11/25/2018 12:02     Assessment and Recommendation  85 y.o. female with chronic kidney disease essential hypertension mixed hyperlipidemia with a non-ST elevation myocardial infarction and abnormal EKG Cardiac catheterization showing normal LV systolic function with ejection fraction of 50% and moderate noncritical coronary atherosclerosis 1.  Continue medication management for non-ST elevation myocardial infarction including aspirin and Plavix as able 2.  Beta-blocker for myocardial infarction 3.  Hypertension control with medication management with a goal systolic pressure of 130 mm 4.  Cardiac rehabilitation 5.  No further cardiac intervention at this time 6.   Begin ambulation today following for improvements of symptoms and adjustments of medications management.  Patient okay for discharge home from cardiac standpoint with follow-up in 1 to 2 weeks for further assessment and management  Signed, Arnoldo HookerBruce Ercell Perlman M.D. FACC

## 2018-11-30 NOTE — Plan of Care (Signed)
  Problem: Education: Goal: Understanding of cardiac disease, CV risk reduction, and recovery process will improve Outcome: Adequate for Discharge Goal: Individualized Educational Video(s) Outcome: Adequate for Discharge   Problem: Activity: Goal: Ability to tolerate increased activity will improve Outcome: Adequate for Discharge   Problem: Cardiac: Goal: Ability to achieve and maintain adequate cardiovascular perfusion will improve Outcome: Adequate for Discharge   Problem: Health Behavior/Discharge Planning: Goal: Ability to safely manage health-related needs after discharge will improve Outcome: Adequate for Discharge   Problem: Education: Goal: Understanding of CV disease, CV risk reduction, and recovery process will improve Outcome: Adequate for Discharge Goal: Individualized Educational Video(s) Outcome: Adequate for Discharge   Problem: Activity: Goal: Ability to return to baseline activity level will improve Outcome: Adequate for Discharge   Problem: Cardiovascular: Goal: Ability to achieve and maintain adequate cardiovascular perfusion will improve Outcome: Adequate for Discharge Goal: Vascular access site(s) Level 0-1 will be maintained Outcome: Adequate for Discharge   Problem: Health Behavior/Discharge Planning: Goal: Ability to safely manage health-related needs after discharge will improve Outcome: Adequate for Discharge   

## 2018-11-30 NOTE — Clinical Social Work Note (Signed)
Clinical Social Work Assessment  Patient Details  Name: Pamela [name] MRN: [id number] Date of Birth: [birth year]  Date of referral:  11/30/18               Reason for consult:  Facility Placement                Permission sought to share information with:  Facility Medical sales representative, Family Supports Permission granted to share information::  Yes, Verbal Permission Granted  Name::     Pamela [name], Pamela [name] [phone] or Pamela [name], Pamela [name] [phone]   Agency::  [facility]  Relationship::     Contact Information:     Housing/Transportation Living arrangements for the past 2 months:  Assisted Living Facility Source of Information:  Adult Children, Patient Patient Interpreter Needed:  None Criminal Activity/Legal Involvement Pertinent to Current Situation/Hospitalization:    Significant Relationships:  Adult Children, Spouse Lives with:  Facility Resident([facility]) Do you feel safe going back to the place where you live?  Yes Need for family participation in patient care:  Yes (Comment)  Care giving concerns:  Patient and family did not express any concerns about returning back to ALF.   Social Worker assessment / plan:  Patient is an 85 year old female who is alert and oriented x3.  Patient states she has been doing well at ALF, and would like to return back.  CSW explained role of CSW and process for coordinating with [facility] to help patient return safely.  Patient states she has been at ALF for awhile, and she feels like the staff treat her well.  Patient states she is ready to return back to ALF.  Employment status:  Retired Database administrator PT Recommendations:  Not assessed at this time Information / Referral to community resources:     Patient/Family's Response to care:  Patient and family are agreeable to returning back to ALF.  Patient/Family's Understanding of and Emotional Response to Diagnosis, Current  Treatment, and Prognosis:  Patient expressed that she is hopeful that she can return back home soon.  Emotional Assessment Appearance:  Appears stated age Attitude/Demeanor/Rapport:  Engaged, Flirtatious (Comment) Affect (typically observed):  Accepting, Appropriate, Calm Orientation:  Oriented to Self, Oriented to Place Alcohol / Substance use:  Not Applicable Psych involvement (Current and /or in the community):  No (Comment)  Discharge Needs  Concerns to be addressed:  Care Coordination Readmission within the last 30 days:  No Current discharge risk:  Cognitively Impaired Barriers to Discharge:  No Barriers Identified   Darleene Cleaver, LCSWA 11/30/2018, 4:50 PM

## 2018-11-30 NOTE — Clinical Social Work Note (Signed)
CSW spoke to [facility], she stated she will have to come assess patient before she can determine if patient is able to return back to ALF with home health.  Ervin Knack. Alizea Pell, MSW, [facility]resia Majors [phone]  11/30/2018 10:32 AM

## 2018-11-30 NOTE — Care Management Note (Signed)
Case Management Note  Patient Details  Name: GABRIELLE GREENHILL MRN: [id number] Date of Birth: [birth year]  Subjective/Objective:    Patient admitted with NSTEMI; s/p cardiac cath with no stent placement.  Will be discharging back to [facility].  She lives with her husband.  Denies difficulties obtaining medications or with accessing medical care.  She is current with her PCP.  Provided list of home health agencies with ratings.  Patient would like to use [facility].  Refferal made for RN and PT which was accepted.               Action/Plan:   Expected Discharge Date:  11/30/18               Expected Discharge Plan:  Home w Home Health Services  In-House Referral:     Discharge planning Services  CM Consult  Post Acute Care Choice:  Home Health Choice offered to:  Patient  DME Arranged:    DME Agency:     HH Arranged:  RN, PT HH Agency:  [facility]  Status of Service:  Completed, signed off  If discussed at Long Length of Stay Meetings, dates discussed:    Additional Comments:  Sherren Kerns, RN 11/30/2018, 2:17 PM

## 2018-11-30 NOTE — Progress Notes (Signed)
Report given to [place] at [facility]. EMS called for transport.

## 2018-11-30 NOTE — Plan of Care (Signed)
  Problem: Activity: Goal: Ability to tolerate increased activity will improve Outcome: Progressing   Problem: Health Behavior/Discharge Planning: Goal: Ability to safely manage health-related needs after discharge will improve Outcome: Progressing   Problem: Activity: Goal: Ability to return to baseline activity level will improve Outcome: Progressing   Problem: Cardiovascular: Goal: Ability to achieve and maintain adequate cardiovascular perfusion will improve Outcome: Progressing Goal: Vascular access site(s) Level 0-1 will be maintained Outcome: Progressing   Problem: Health Behavior/Discharge Planning: Goal: Ability to safely manage health-related needs after discharge will improve Outcome: Progressing

## 2018-11-30 NOTE — Discharge Summary (Addendum)
[facility] [place] [facility]   PATIENT NAME: Pamela [name]    MR#:  [id number]  DATE OF BIRTH:  12/15/1933  DATE OF ADMISSION:  11/28/2018   ADMITTING PHYSICIAN: Oralia Manisavid Willis, MD  DATE OF DISCHARGE: 11/30/2018  PRIMARY CARE PHYSICIAN: Gracelyn NurseJohnston, John D, MD   ADMISSION DIAGNOSIS:  NSTEMI (non-ST elevated myocardial infarction) (HCC) [I21.4] DISCHARGE DIAGNOSIS:  Principal Problem:   NSTEMI (non-ST elevated myocardial infarction) (HCC) Active Problems:   HTN (hypertension)   Hypercholesterolemia   PAF (paroxysmal atrial fibrillation) (HCC)   GERD (gastroesophageal reflux disease)   Diabetes mellitus (HCC)  SECONDARY DIAGNOSIS:   Past Medical History:  Diagnosis Date  . A-fib (HCC) 08/28/2014   Overview:  Overview:  Diastolic dysfunction on ECHO 04/2012-[facility] at [facility]   . Abnormal gait 02/08/2012  . Abscess or cellulitis of leg 04/18/2012  . Absence of bladder continence 07/29/2012  . Acid reflux 02/08/2012  . Acute kidney failure (HCC) 05/15/2012  . Afib, paroxysmal, with RVR at times with BBB 05/13/2012  . Allergic drug rash 05/15/2012  . Anemia 05/13/2012  . 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 Benign essential HTN 02/08/2012  . Bladder infection, chronic 06/29/2013  . BP (high blood pressure) 05/12/2012  . Bursitis of knee 08/07/2014  . Cellulitis in diabetic foot (HCC) 05/12/2012  . CHF (congestive heart failure) (HCC)   . Chronic kidney disease 06/01/2012   Overview:  Overview:   12/2012- Renal US, simple L cyst. UIEP normal   . Clinical depression 08/28/2014  . CN (constipation) 03/17/2012  . Dermatitis due to drug reaction 05/15/2012  . Diabetes mellitus   . Diastolic dysfunction, left ventricle 05/14/2012  . DM (diabetes mellitus) (HCC) 05/12/2012  . Dysrhythmia   . Dysuria   . Edema leg 07/02/2014  . Enthesopathy of knee 08/07/2014  . Excess fluid volume 05/20/2012  . FOM (frequency of micturition) 07/29/2012  . Frank hematuria 09/27/2014  . Gangrene  (HCC)   . GERD (gastroesophageal reflux disease)   . Heart disease   . High cholesterol   . HTN (hypertension) 05/12/2012  . Hypercholesterolemia 05/12/2012  . Hypertension   . Incomplete bladder emptying 07/29/2012  . Left bundle branch block (LBBB)   . Open wound of knee, leg (except thigh), and ankle 06/01/2012  . 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   Overview:  Overview:  Several years ago, allergic reaction to sulfa antibiotic Several years ago, allergic reaction to sulfa antibiotic    HOSPITAL COURSE:   NSTEMI (non-ST elevated myocardial infarction)Cardiac catheterization showing normal LV systolic function with ejection fraction of 50% and moderate noncritical coronary atherosclerosis Continue medication management for non-ST elevation myocardial infarction including aspirin and Plavix as able per Dr. Gwen PoundsKowalski. The patient was on heparin drip.  She is started Plavix after cardiac cath.  Unable to give aspirin due to allergy.  Continue Lipitor and Lopressor. Echo: LV EF: 45% -   50%.    HTN (hypertension) -home dose antihypertensives   PAF (paroxysmal atrial fibrillation) (HCC) -continue home dose rate controlling medication   Diabetes mellitus (HCC) -sliding scale insulin coverage, resume home medication after discharge.   Hypercholesterolemia -Home dose antilipid   GERD (gastroesophageal reflux disease) -home dose PPI CKD stage III.  Stable. DISCHARGE CONDITIONS:  Stable, discharge to assisted living facility with home health and PT today. CONSULTS OBTAINED:  Treatment Team:  Lamar BlinksKowalski, Bruce J, MD DRUG ALLERGIES:   Allergies  Allergen Reactions  . Ciprofloxacin Anaphylaxis  . Sulfamethoxazole-Trimethoprim Other (See Comments) and  Rash    Went into [employer].  . Augmentin [Amoxicillin-Pot Clavulanate] Hives  . Hydrochlorothiazide   . Levofloxacin Other (See Comments)  . Losartan Potassium-Hctz Other (See Comments)    Other reaction(s): Unknown  .  Nitrofurantoin Other (See Comments)  . Sulfa Antibiotics Other (See Comments)    Levonne Spiller Steven's Johnsons  . Sulfasalazine     unknown  . Levaquin [Levofloxacin In D5w]    DISCHARGE MEDICATIONS:   Allergies as of 11/30/2018      Reactions   Ciprofloxacin Anaphylaxis   Sulfamethoxazole-trimethoprim Other (See Comments), Rash   Went into [employer].   Augmentin [amoxicillin-pot Clavulanate] Hives   Hydrochlorothiazide    Levofloxacin Other (See Comments)   Losartan Potassium-hctz Other (See Comments)   Other reaction(s): Unknown   Nitrofurantoin Other (See Comments)   Sulfa Antibiotics Other (See Comments)   Levonne Spiller Steven[employer] Johnsons   Sulfasalazine    unknown   Levaquin [levofloxacin In D5w]       Medication List    TAKE these medications   amiodarone 200 MG tablet Commonly known as:  PACERONE Take 200 mg by mouth daily.   amitriptyline 50 MG tablet Commonly known as:  ELAVIL Take 50 mg by mouth at bedtime.   amLODipine 5 MG tablet Commonly known as:  NORVASC Take 5 mg by mouth daily.   atorvastatin 40 MG tablet Commonly known as:  LIPITOR Take 40 mg by mouth daily.   bisacodyl 5 MG EC tablet Commonly known as:  bisacodyl Take 1 tablet (5 mg total) by mouth daily as needed for moderate constipation.   clopidogrel 75 MG tablet Commonly known as:  PLAVIX Take 1 tablet (75 mg total) by mouth daily.   insulin lispro protamine-lispro (75-25) 100 UNIT/ML Susp injection Commonly known as:  HUMALOG 75/25 MIX Inject 20 Units into the skin daily.   insulin lispro protamine-lispro (75-25) 100 UNIT/ML Susp injection Commonly known as:  HUMALOG 75/25 MIX Inject 18 Units into the skin at bedtime.   memantine 7 MG Cp24 24 hr capsule Commonly known as:  NAMENDA XR Take 7 mg by mouth daily.   metoprolol tartrate 25 MG tablet Commonly known as:  LOPRESSOR Take 25 mg by mouth 2 (two) times daily. What changed:  Another medication with the  same name was removed. Continue taking this medication, and follow the directions you see here.   nitrofurantoin (macrocrystal-monohydrate) 100 MG capsule Commonly known as:  MACROBID Take 1 capsule (100 mg total) by mouth every 12 (twelve) hours.   nitroGLYCERIN 0.4 MG SL tablet Commonly known as:  NITROSTAT Place 1 tablet (0.4 mg total) under the tongue every 5 (five) minutes as needed for chest pain.   NOVOLOG MIX 70/30 FLEXPEN (70-30) 100 UNIT/ML FlexPen Generic drug:  insulin aspart protamine - aspart Inject 0.12 mLs (12 Units total) into the skin 2 (two) times daily with a meal.   pantoprazole 40 MG tablet Commonly known as:  PROTONIX Take 40 mg by mouth daily.   vitamin B-12 1000 MCG tablet Commonly known as:  CYANOCOBALAMIN Take 1,000 mcg by mouth daily.        DISCHARGE INSTRUCTIONS:  See AVS. If you experience worsening of your admission symptoms, develop shortness of breath, life threatening emergency, suicidal or homicidal thoughts you must seek medical attention immediately by calling 911 or calling your MD immediately  if symptoms less severe.  You Must read complete instructions/literature along with all the possible adverse reactions/side effects for all  the Medicines you take and that have been prescribed to you. Take any new Medicines after you have completely understood and accpet all the possible adverse reactions/side effects.   Please note  You were cared for by a hospitalist during your hospital stay. If you have any questions about your discharge medications or the care you received while you were in the hospital after you are discharged, you can call the unit and asked to speak with the hospitalist on call if the hospitalist that took care of you is not available. Once you are discharged, your primary care physician will handle any further medical issues. Please note that NO REFILLS for any discharge medications will be authorized once you are discharged, as  it is imperative that you return to your primary care physician (or establish a relationship with a primary care physician if you do not have one) for your aftercare needs so that they can reassess your need for medications and monitor your lab values.    On the day of Discharge:  VITAL SIGNS:  Blood pressure (!) 119/54, pulse 79, temperature 98.9 F (37.2 C), temperature source Oral, resp. rate 18, height 5\' 5"  (1.651 m), weight 83.5 kg, SpO2 93 %. PHYSICAL EXAMINATION:  GENERAL:  85 y.o.-year-old patient lying in the bed with no acute distress.  EYES: Pupils equal, round, reactive to light and accommodation. No scleral icterus. Extraocular muscles intact.  HEENT: Head atraumatic, normocephalic. Oropharynx and nasopharynx clear.  NECK:  Supple, no jugular venous distention. No thyroid enlargement, no tenderness.  LUNGS: Normal breath sounds bilaterally, no wheezing, rales,rhonchi or crepitation. No use of accessory muscles of respiration.  CARDIOVASCULAR: S1, S2 normal. No murmurs, rubs, or gallops.  ABDOMEN: Soft, non-tender, non-distended. Bowel sounds present. No organomegaly or mass.  EXTREMITIES: No pedal edema, cyanosis, or clubbing.  NEUROLOGIC: Cranial nerves II through XII are intact. Muscle strength 5/5 in all extremities. Sensation intact. Gait not checked.  PSYCHIATRIC: The patient is alert and oriented x 3.  SKIN: No obvious rash, lesion, or ulcer.  DATA REVIEW:   CBC Recent Labs  Lab 11/29/18 0454 11/30/18 0406  WBC 7.3  --   HGB 11.5* 11.0*  HCT 35.6*  --   PLT 182  --     Chemistries  Recent Labs  Lab 11/28/18 2050  11/30/18 0406  NA 137   < > 136  K 3.5   < > 4.1  CL 103   < > 106  CO2 25   < > 22  GLUCOSE 166*   < > 158*  BUN 35*   < > 28*  CREATININE 1.42*   < > 1.37*  CALCIUM 9.0   < > 8.6*  MG  --   --  2.0  AST 23  --   --   ALT 18  --   --   ALKPHOS 90  --   --   BILITOT 0.4  --   --    < > = values in this interval not displayed.      Microbiology Results  Results for orders placed or performed during the hospital encounter of 11/28/18  MRSA PCR Screening     Status: None   Collection Time: 11/29/18  6:32 PM  Result Value Ref Range Status   MRSA by PCR NEGATIVE NEGATIVE Final    Comment:        The GeneXpert MRSA Assay (FDA approved for NASAL specimens only), is one component of a comprehensive MRSA colonization  surveillance program. It is not intended to diagnose MRSA infection nor to guide or monitor treatment for MRSA infections. Performed at [facility], [address] [place], [place] [address]     RADIOLOGY:  No results found.   Management plans discussed with the patient, family and they are in agreement.  CODE STATUS: Full Code   TOTAL TIME TAKING CARE OF THIS PATIENT:  32 minutes.    Shaune PollackQing Jacorey Donaway M.D on 11/30/2018 at 1:36 PM  Between 7am to 6pm - Pager - [phone]  After 6pm go to [email or web address] - Social research officer, governmentpassword [email or web address] [email or web address]  [facility]  Office  [phone]  CC: Primary care physician; Gracelyn NurseJohnston, John D, MD   Note: This dictation was prepared with Dragon dictation along with smaller phrase technology. Any transcriptional errors that result from this process are unintentional.

## 2018-11-30 NOTE — Care Management (Deleted)
RNCM notified by Steve, RN that patient and daughter have decided they would prefer to discharge to SNF.  This RNCM to bedside; daughter states she feels her mother needs more care than what she and home health can provide.  Notified Eric, CSW. 

## 2018-11-30 NOTE — [place] FL2 (Signed)
[place] MEDICAID FL2 LEVEL OF CARE SCREENING TOOL     IDENTIFICATION  Patient Name: Pamela [name] Birthdate: June 26, 1934 Sex: female Admission Date (Current Location): 11/28/2018  [place] and [place] Number:  Chiropodist and Address:  [facility], [address], [place], [place] [address]      Provider Number: [id number]  Attending Physician Name and Address:  Shaune Pollack, MD  Relative Name and Phone Number:  Brynli, Pfost [phone] or Leathie, [place] Spouse [phone]     Current Level of Care: Hospital Recommended Level of Care: ALF Prior Approval Number:    Date Approved/Denied:   PASRR Number:    Discharge Plan: Domiciliary (Rest home)    Current Diagnoses: Patient Active Problem List   Diagnosis Date Noted  . NSTEMI (non-ST elevated myocardial infarction) (HCC) 11/28/2018  . UTI (urinary tract infection) 11/28/2017  . Pelvic muscle wasting 01/26/2017  . Cystocele, midline 01/26/2017  . Urinary frequency 12/26/2015  . Incontinence 12/07/2015  . Cystocele 12/07/2015  . Tinea cruris 12/07/2015  . Recurrent UTI 08/11/2015  . Vaginal atrophy 08/11/2015  . 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 Discoordination 05/08/2015  . Frank hematuria 09/27/2014  . Clinical depression 08/28/2014  . Major depressive disorder with single episode 08/28/2014  . Bursitis of knee 08/07/2014  . Enthesopathy of knee 08/07/2014  . Edema leg 07/02/2014  . Bladder infection, chronic 06/29/2013  . Incomplete bladder emptying 07/29/2012  . FOM (frequency of micturition) 07/29/2012  . Absence of bladder continence 07/29/2012  . Chronic kidney disease 06/01/2012  . Open wound of knee, leg (except thigh), and ankle 06/01/2012  . Skin rash 05/20/2012  . Acute kidney failure (HCC) 05/15/2012  . Allergic drug rash 05/15/2012  . 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  . Dermatitis due to drug taken internally  05/15/2012  . Dermatitis due to drug reaction 05/15/2012  . Acute renal failure (HCC) 05/15/2012  . Diastolic dysfunction, left ventricle 05/14/2012  . PAF (paroxysmal atrial fibrillation) (HCC) 05/13/2012  . Anemia 05/13/2012  . Cellulitis in diabetic foot (HCC) 05/12/2012  . HTN (hypertension) 05/12/2012  . Hypercholesterolemia 05/12/2012  . Abscess or cellulitis of leg 04/18/2012  . Diabetes mellitus (HCC) 04/18/2012  . CN (constipation) 03/17/2012  . Abnormal gait 02/08/2012  . Benign essential HTN 02/08/2012  . GERD (gastroesophageal reflux disease) 02/08/2012    Orientation RESPIRATION BLADDER Height & Weight     Self, Time, Situation, Place  Normal Incontinent Weight: 184 lb 1.6 oz (83.5 kg) Height:  5\' 5"  (165.1 cm)  BEHAVIORAL SYMPTOMS/MOOD NEUROLOGICAL BOWEL NUTRITION STATUS      Continent Diet(Carb modified)  AMBULATORY STATUS COMMUNICATION OF NEEDS Skin   Supervision Verbally Normal                       Personal Care Assistance Level of Assistance  Bathing, Feeding, Dressing Bathing Assistance: Limited assistance Feeding assistance: Independent Dressing Assistance: Limited assistance     Functional Limitations Info  Sight, Hearing, Speech Sight Info: Adequate Hearing Info: Adequate Speech Info: Adequate    SPECIAL CARE FACTORS FREQUENCY  PT (By licensed PT), OT (By licensed OT)     PT Frequency: Home Health minimum 2x a week OT Frequency: Home Health Minimum 2x a week            Contractures Contractures Info: Not present    Additional Factors Info  Code Status, Allergies, Insulin Sliding Scale Code Status Info: Full Code Allergies Info: CIPROFLOXACIN, SULFAMETHOXAZOLE-TRIMETHOPRIM, AUGMENTIN  AMOXICILLIN-POT CLAVULANATE, HYDROCHLOROTHIAZIDE, LEVOFLOXACIN, LOSARTAN POTASSIUM-HCTZ, NITROFURANTOIN, SULFA ANTIBIOTICS, SULFASALAZINE, LEVAQUIN LEVOFLOXACIN IN D5W    Insulin Sliding Scale Info: insulin aspart (novoLOG) injection 0-9 Units 3x a day  with meals       Current Medications (11/30/2018):  This is the current hospital active medication list Current Facility-Administered Medications  Medication Dose Route Frequency Provider Last Rate Last Dose  . acetaminophen (TYLENOL) tablet 650 mg  650 mg Oral Q6H PRN Oralia ManisWillis, David, MD       Or  . acetaminophen (TYLENOL) suppository 650 mg  650 mg Rectal Q6H PRN Oralia ManisWillis, David, MD      . acetaminophen (TYLENOL) tablet 650 mg  650 mg Oral Q4H PRN Lamar BlinksKowalski, Bruce J, MD      . amiodarone (PACERONE) tablet 200 mg  200 mg Oral Daily Oralia ManisWillis, David, MD   200 mg at 11/30/18 [id number]  . amLODipine (NORVASC) tablet 5 mg  5 mg Oral Daily Oralia ManisWillis, David, MD   5 mg at 11/30/18 [id number]  . atorvastatin (LIPITOR) tablet 40 mg  40 mg Oral Daily Oralia ManisWillis, David, MD   40 mg at 11/30/18 [id number]  . clopidogrel (PLAVIX) tablet 75 mg  75 mg Oral Daily Shaune Pollackhen, Qing, MD   75 mg at 11/30/18 [id number]  . docusate sodium (COLACE) capsule 100 mg  100 mg Oral BID Shaune Pollackhen, Qing, MD   100 mg at 11/30/18 1014  . insulin aspart (novoLOG) injection 0-9 Units  0-9 Units Subcutaneous Q6H Oralia ManisWillis, David, MD   3 Units at 11/30/18 1203  . memantine (NAMENDA XR) 24 hr capsule 7 mg  7 mg Oral Daily Oralia ManisWillis, David, MD   7 mg at 11/30/18 [id number]  . metoprolol tartrate (LOPRESSOR) tablet 25 mg  25 mg Oral BID Oralia ManisWillis, David, MD   25 mg at 11/30/18 [id number]  . nitroGLYCERIN (NITROSTAT) SL tablet 0.4 mg  0.4 mg Sublingual Q5 min PRN Oralia ManisWillis, David, MD      . ondansetron [facility](ZOFRAN) injection 4 mg  4 mg Intravenous Q6H PRN Lamar BlinksKowalski, Bruce J, MD      . ondansetron [facility](ZOFRAN) tablet 4 mg  4 mg Oral Q6H PRN Oralia ManisWillis, David, MD      . oxyCODONE (Oxy IR/ROXICODONE) immediate release tablet 5 mg  5 mg Oral Q4H PRN Oralia ManisWillis, David, MD   5 mg at 11/30/18 1014  . pantoprazole (PROTONIX) EC tablet 40 mg  40 mg Oral Daily Oralia ManisWillis, David, MD   40 mg at 11/30/18 [id number]     Discharge Medications: TAKE these medications   amiodarone 200 MG tablet Commonly known as:  PACERONE Take 200 mg by  mouth daily.   amitriptyline 50 MG tablet Commonly known as:  ELAVIL Take 50 mg by mouth at bedtime.   amLODipine 5 MG tablet Commonly known as:  NORVASC Take 5 mg by mouth daily.   atorvastatin 40 MG tablet Commonly known as:  LIPITOR Take 40 mg by mouth daily.   bisacodyl 5 MG EC tablet Commonly known as:  bisacodyl Take 1 tablet (5 mg total) by mouth daily as needed for moderate constipation.   clopidogrel 75 MG tablet Commonly known as:  PLAVIX Take 1 tablet (75 mg total) by mouth daily.   insulin lispro protamine-lispro (75-25) 100 UNIT/ML Susp injection Commonly known as:  HUMALOG 75/25 MIX Inject 20 Units into the skin daily.   insulin lispro protamine-lispro (75-25) 100 UNIT/ML Susp injection Commonly known as:  HUMALOG 75/25 MIX Inject 18 Units into the skin at bedtime.   memantine 7  MG Cp24 24 hr capsule Commonly known as:  NAMENDA XR Take 7 mg by mouth daily.   metoprolol tartrate 25 MG tablet Commonly known as:  LOPRESSOR Take 25 mg by mouth 2 (two) times daily. What changed:  Another medication with the same name was removed. Continue taking this medication, and follow the directions you see here.   nitrofurantoin (macrocrystal-monohydrate) 100 MG capsule Commonly known as:  MACROBID Take 1 capsule (100 mg total) by mouth every 12 (twelve) hours.   nitroGLYCERIN 0.4 MG SL tablet Commonly known as:  NITROSTAT Place 1 tablet (0.4 mg total) under the tongue every 5 (five) minutes as needed for chest pain.   NOVOLOG MIX 70/30 FLEXPEN (70-30) 100 UNIT/ML FlexPen Generic drug:  insulin aspart protamine - aspart Inject 0.12 mLs (12 Units total) into the skin 2 (two) times daily with a meal.   pantoprazole 40 MG tablet Commonly known as:  PROTONIX Take 40 mg by mouth daily.   vitamin B-12 1000 MCG tablet Commonly known as:  CYANOCOBALAMIN Take 1,000 mcg by mouth daily.     Relevant Imaging Results:  Relevant Lab  Results:   Additional Information SSN [id number]  Darleene Cleaver, [place]

## 2018-11-30 NOTE — Discharge Instructions (Signed)
HHPT °Fall precaution. °

## 2018-12-01 ENCOUNTER — Encounter: Payer: Self-pay | Admitting: Internal Medicine

## 2018-12-06 ENCOUNTER — Other Ambulatory Visit: Payer: Self-pay

## 2018-12-06 ENCOUNTER — Emergency Department
Admission: EM | Admit: 2018-12-06 | Discharge: 2018-12-06 | Disposition: A | Discharge status: Home / Self Care | Payer: Medicare Other | Attending: Emergency Medicine | Admitting: Emergency Medicine

## 2018-12-06 DIAGNOSIS — Z7902 Long term (current) use of antithrombotics/antiplatelets: Secondary | ICD-10-CM | POA: Diagnosis not present

## 2018-12-06 DIAGNOSIS — Z794 Long term (current) use of insulin: Secondary | ICD-10-CM | POA: Insufficient documentation

## 2018-12-06 DIAGNOSIS — Z79899 Other long term (current) drug therapy: Secondary | ICD-10-CM | POA: Diagnosis not present

## 2018-12-06 DIAGNOSIS — E1122 Type 2 diabetes mellitus with diabetic chronic kidney disease: Secondary | ICD-10-CM | POA: Diagnosis not present

## 2018-12-06 DIAGNOSIS — I130 Hypertensive heart and chronic kidney disease with heart failure and stage 1 through stage 4 chronic kidney disease, or unspecified chronic kidney disease: Secondary | ICD-10-CM | POA: Diagnosis not present

## 2018-12-06 DIAGNOSIS — N189 Chronic kidney disease, unspecified: Secondary | ICD-10-CM | POA: Insufficient documentation

## 2018-12-06 DIAGNOSIS — R102 Pelvic and perineal pain: Secondary | ICD-10-CM | POA: Diagnosis present

## 2018-12-06 DIAGNOSIS — L245 Irritant contact dermatitis due to other chemical products: Secondary | ICD-10-CM

## 2018-12-06 DIAGNOSIS — I5032 Chronic diastolic (congestive) heart failure: Secondary | ICD-10-CM | POA: Diagnosis not present

## 2018-12-06 LAB — URINALYSIS, COMPLETE (UACMP) WITH MICROSCOPIC
Bilirubin Urine: NEGATIVE
Glucose, UA: NEGATIVE mg/dL
Ketones, ur: NEGATIVE mg/dL
Nitrite: NEGATIVE
PROTEIN: 100 mg/dL — AB
RBC / HPF: >50 RBC/hpf — ABNORMAL HIGH (ref 0–5)
Specific Gravity, Urine: 1.01 (ref 1.005–1.030)
WBC, UA: >50 WBC/hpf — ABNORMAL HIGH (ref 0–5)
pH: 6 (ref 5.0–8.0)

## 2018-12-06 LAB — COMPREHENSIVE METABOLIC PANEL
ALT: 17 U/L (ref 0–44)
AST: 23 U/L (ref 15–41)
Albumin: 3 g/dL — ABNORMAL LOW (ref 3.5–5.0)
Alkaline Phosphatase: 92 U/L (ref 38–126)
Anion gap: 8 (ref 5–15)
BUN: 29 mg/dL — ABNORMAL HIGH (ref 8–23)
CO2: 24 mmol/L (ref 22–32)
Calcium: 9.2 mg/dL (ref 8.9–10.3)
Chloride: 108 mmol/L (ref 98–111)
Creatinine, Ser: 1.31 mg/dL — ABNORMAL HIGH (ref 0.44–1.00)
GFR calc Af Amer: 44 mL/min — ABNORMAL LOW (ref >60)
GFR calc non Af Amer: 38 mL/min — ABNORMAL LOW (ref >60)
Glucose, Bld: 178 mg/dL — ABNORMAL HIGH (ref 70–99)
Potassium: 4.2 mmol/L (ref 3.5–5.1)
Sodium: 140 mmol/L (ref 135–145)
Total Bilirubin: 0.6 mg/dL (ref 0.3–1.2)
Total Protein: 6.4 g/dL — ABNORMAL LOW (ref 6.5–8.1)

## 2018-12-06 LAB — CBC WITH DIFFERENTIAL/PLATELET
Abs Immature Granulocytes: 0.04 10*3/uL (ref 0.00–0.07)
Basophils Absolute: 0.1 10*3/uL (ref 0.0–0.1)
Basophils Relative: 1 %
Eosinophils Absolute: 0.1 10*3/uL (ref 0.0–0.5)
Eosinophils Relative: 2 %
HCT: 36.7 % (ref 36.0–46.0)
Hemoglobin: 11.9 g/dL — ABNORMAL LOW (ref 12.0–15.0)
Immature Granulocytes: 1 %
Lymphocytes Relative: 27 %
Lymphs Abs: 1.5 10*3/uL (ref 0.7–4.0)
MCH: 31.4 pg (ref 26.0–34.0)
MCHC: 32.4 g/dL (ref 30.0–36.0)
MCV: 96.8 fL (ref 80.0–100.0)
Monocytes Absolute: 0.5 10*3/uL (ref 0.1–1.0)
Monocytes Relative: 8 %
Neutro Abs: 3.5 10*3/uL (ref 1.7–7.7)
Neutrophils Relative %: 61 %
PLATELETS: 261 10*3/uL (ref 150–400)
RBC: 3.79 MIL/uL — ABNORMAL LOW (ref 3.87–5.11)
RDW: 13.2 % (ref 11.5–15.5)
WBC: 5.6 10*3/uL (ref 4.0–10.5)
nRBC: 0 % (ref 0.0–0.2)

## 2018-12-06 LAB — LIPASE, BLOOD: Lipase: 18 U/L (ref 11–51)

## 2018-12-06 MED ORDER — HYDROCORTISONE 1 % EX OINT
TOPICAL_OINTMENT | Freq: Once | CUTANEOUS | Status: DC
  Filled 2018-12-06: qty 28.35

## 2018-12-06 MED ORDER — HYDROCORTISONE 1 % EX OINT: 1.0000 "application " | TOPICAL_OINTMENT | Freq: Two times a day (BID) | CUTANEOUS | 0 refills | Status: DC

## 2018-12-06 MED ORDER — PHENAZOPYRIDINE HCL 200 MG PO TABS
200.0000 mg | ORAL_TABLET | Freq: Once | ORAL | Status: AC
  Administered 2018-12-06: 200 mg via ORAL
  Filled 2018-12-06: qty 1

## 2018-12-06 MED ORDER — FLAVOXATE HCL 100 MG PO TABS: 100.0000 mg | ORAL_TABLET | Freq: Once | ORAL | Status: DC

## 2018-12-06 MED ORDER — FLAVOXATE HCL 100 MG PO TABS: 100.0000 mg | ORAL_TABLET | Freq: Three times a day (TID) | ORAL | Status: DC | PRN

## 2018-12-06 NOTE — ED Triage Notes (Signed)
Pt BIB EMS from [facility] for c/o bilateral lower abd pain. Pt has an indwelling foley that has cloudy urine present in drainage bag. Pt is afebrile on arrival. HX of dementia.

## 2018-12-06 NOTE — ED Notes (Signed)
Foley emptied at this time. About 

## 2018-12-06 NOTE — ED Notes (Signed)
Per pt call ems for transport - no family member available.

## 2018-12-06 NOTE — ED Provider Notes (Signed)
[facility] Emergency Department Provider Note  ____________________________________________   First MD Initiated Contact with Patient 12/06/18 0411     (approximate)  I have reviewed the triage vital signs and the nursing notes.   HISTORY  Chief Complaint Abdominal Pain    HPI Pamela GUILBERT is a 85 y.o. female comes to the emergency department via EMS with sharp vaginal pain.  The patient has a complex past medical history including atony of her bladder for which she is fully dependent.  She formerly had a suprapubic catheter although that has been removed and she is fully dependent now for the rest of her life.  She thinks the last time her catheter was changed was about 3 weeks ago.  She reports sharp burning and aching discomfort right at her vulva.  I appreciate EMSs report of "abdominal pain" however the patient very clearly is able to tell me that she never had any abdominal pain whatsoever.  She actually has some dysuria despite the Foley catheter.  Her symptoms came on suddenly are severe are described as sharp and burning and are intermittent.  She is currently asymptomatic.  Nothing in particular seems to make the symptoms better or worse.    Past Medical History:  Diagnosis Date  . A-fib (HCC) 08/28/2014   Overview:  Overview:  Diastolic dysfunction on ECHO 04/2012-done at [facility]   . Abnormal gait 02/08/2012  . Abscess or cellulitis of leg 04/18/2012  . Absence of bladder continence 07/29/2012  . Acid reflux 02/08/2012  . Acute kidney failure (HCC) 05/15/2012  . Afib, paroxysmal, with RVR at times with BBB 05/13/2012  . Allergic drug rash 05/15/2012  . Anemia 05/13/2012  . 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 Benign essential HTN 02/08/2012  . Bladder infection, chronic 06/29/2013  . BP (high blood pressure) 05/12/2012  . Bursitis of knee 08/07/2014  . Cellulitis in diabetic foot (HCC) 05/12/2012  . CHF (congestive heart failure) (HCC)   . Chronic  kidney disease 06/01/2012   Overview:  Overview:   12/2012- Renal US, simple L cyst. UIEP normal   . Clinical depression 08/28/2014  . CN (constipation) 03/17/2012  . Dermatitis due to drug reaction 05/15/2012  . Diabetes mellitus   . Diastolic dysfunction, left ventricle 05/14/2012  . DM (diabetes mellitus) (HCC) 05/12/2012  . Dysrhythmia   . Dysuria   . Edema leg 07/02/2014  . Enthesopathy of knee 08/07/2014  . Excess fluid volume 05/20/2012  . FOM (frequency of micturition) 07/29/2012  . Frank hematuria 09/27/2014  . Gangrene (HCC)   . GERD (gastroesophageal reflux disease)   . Heart disease   . High cholesterol   . HTN (hypertension) 05/12/2012  . Hypercholesterolemia 05/12/2012  . Hypertension   . Incomplete bladder emptying 07/29/2012  . Left bundle branch block (LBBB)   . Open wound of knee, leg (except thigh), and ankle 06/01/2012  . 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   Overview:  Overview:  Several years ago, allergic reaction to sulfa antibiotic Several years ago, allergic reaction to sulfa antibiotic     Patient Active Problem List   Diagnosis Date Noted  . NSTEMI (non-ST elevated myocardial infarction) (HCC) 11/28/2018  . UTI (urinary tract infection) 11/28/2017  . Pelvic muscle wasting 01/26/2017  . Cystocele, midline 01/26/2017  . Urinary frequency 12/26/2015  . Incontinence 12/07/2015  . Cystocele 12/07/2015  . Tinea cruris 12/07/2015  . Recurrent UTI 08/11/2015  . Vaginal atrophy 08/11/2015  . 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  Discoordination 05/08/2015  . Frank hematuria 09/27/2014  . Clinical depression 08/28/2014  . Major depressive disorder with single episode 08/28/2014  . Bursitis of knee 08/07/2014  . Enthesopathy of knee 08/07/2014  . Edema leg 07/02/2014  . Bladder infection, chronic 06/29/2013  . Incomplete bladder emptying 07/29/2012  . FOM (frequency of micturition) 07/29/2012  . Absence of bladder continence 07/29/2012  . Chronic kidney disease  06/01/2012  . Open wound of knee, leg (except thigh), and ankle 06/01/2012  . Skin rash 05/20/2012  . Acute kidney failure (HCC) 05/15/2012  . Allergic drug rash 05/15/2012  . 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  . Dermatitis due to drug taken internally 05/15/2012  . Dermatitis due to drug reaction 05/15/2012  . Acute renal failure (HCC) 05/15/2012  . Diastolic dysfunction, left ventricle 05/14/2012  . PAF (paroxysmal atrial fibrillation) (HCC) 05/13/2012  . Anemia 05/13/2012  . Cellulitis in diabetic foot (HCC) 05/12/2012  . HTN (hypertension) 05/12/2012  . Hypercholesterolemia 05/12/2012  . Abscess or cellulitis of leg 04/18/2012  . Diabetes mellitus (HCC) 04/18/2012  . CN (constipation) 03/17/2012  . Abnormal gait 02/08/2012  . Benign essential HTN 02/08/2012  . GERD (gastroesophageal reflux disease) 02/08/2012    Past Surgical History:  Procedure Laterality Date  . ABDOMINAL HYSTERECTOMY    . BLADDER SURGERY    . cataract surgery    . ENDOVENOUS ABLATION SAPHENOUS VEIN W/ LASER    . LEFT HEART CATH AND CORONARY ANGIOGRAPHY N/A 11/29/2018   Procedure: LEFT HEART CATH AND CORONARY ANGIOGRAPHY;  Surgeon: Lamar Blinks, MD;  Location: [facility] INVASIVE CV LAB;  Service: Cardiovascular;  Laterality: N/A;  . NASAL SINUS SURGERY    . TOE AMPUTATION    . TONSILLECTOMY      Prior to Admission medications   Medication Sig Start Date End Date Taking? Authorizing Provider  amiodarone (PACERONE) 200 MG tablet Take 200 mg by mouth daily.  12/16/15   [provider]  amitriptyline (ELAVIL) 50 MG tablet Take 50 mg by mouth at bedtime.  03/31/16   [provider]  amLODipine (NORVASC) 5 MG tablet Take 5 mg by mouth daily.    [provider]  atorvastatin (LIPITOR) 40 MG tablet Take 40 mg by mouth daily.    [provider]  bisacodyl (BISACODYL) 5 MG EC tablet Take 1 tablet (5 mg total) by mouth daily as needed for moderate constipation. 11/30/18    Shaune Pollack, MD  clopidogrel (PLAVIX) 75 MG tablet Take 1 tablet (75 mg total) by mouth daily. 11/30/18   Shaune Pollack, MD  hydrocortisone 1 % ointment Apply 1 application topically 2 (two) times daily. Please apply liberally to your vulva twice a day to help prevent further irritation 12/06/18   Merrily Brittle, MD  insulin lispro protamine-lispro (HUMALOG 75/25 MIX) (75-25) 100 UNIT/ML SUSP injection Inject 20 Units into the skin daily.    [provider]  insulin lispro protamine-lispro (HUMALOG 75/25 MIX) (75-25) 100 UNIT/ML SUSP injection Inject 18 Units into the skin at bedtime.    [provider]  memantine (NAMENDA XR) 7 MG CP24 24 hr capsule Take 7 mg by mouth daily.    [provider]  metoprolol tartrate (LOPRESSOR) 25 MG tablet Take 25 mg by mouth 2 (two) times daily.    [provider]  nitrofurantoin, macrocrystal-monohydrate, (MACROBID) 100 MG capsule Take 1 capsule (100 mg total) by mouth every 12 (twelve) hours. Patient not taking: Reported on 11/28/2018 03/18/18   Harle Battiest, PA-C  nitroGLYCERIN (NITROSTAT) 0.4 MG SL tablet Place 1 tablet (0.4 mg total) under the tongue every 5 (five) minutes as needed for chest pain. 11/30/18   Shaune Pollackhen, Qing, MD  NOVOLOG MIX 70/30 FLEXPEN (70-30) 100 UNIT/ML FlexPen Inject 0.12 mLs (12 Units total) into the skin 2 (two) times daily with a meal. Patient not taking: Reported on 11/28/2018 11/30/17   Adrian SaranMody, Sital, MD  pantoprazole (PROTONIX) 40 MG tablet Take 40 mg by mouth daily.    [provider]  vitamin B-12 (CYANOCOBALAMIN) 1000 MCG tablet Take 1,000 mcg by mouth daily.    [provider]    Allergies Ciprofloxacin; Sulfamethoxazole-trimethoprim; Augmentin [amoxicillin-pot clavulanate]; Hydrochlorothiazide; Levofloxacin; Losartan potassium-hctz; Nitrofurantoin; Sulfa antibiotics; Sulfasalazine; and Levaquin [levofloxacin in d5w]  Family History  Problem Relation Age of Onset  . Diabetes Mellitus  II Mother   . Heart disease Mother   . Lung cancer Father   . Diabetes Paternal Grandmother   . Diabetes Maternal Grandmother   . Kidney disease Neg Hx   . Prostate cancer Neg Hx   . Bladder Cancer Neg Hx     Social History Social History   Tobacco Use  . Smoking status: Never Smoker  . Smokeless tobacco: Never Used  Substance Use Topics  . Alcohol use: No    Alcohol/week: 0.0 standard drinks  . Drug use: No    Review of Systems Constitutional: No fever/chills Eyes: No visual changes. ENT: No sore throat. Cardiovascular: Denies chest pain. Respiratory: Denies shortness of breath. Gastrointestinal: No abdominal pain.  No nausea, no vomiting.  No diarrhea.  No constipation. Genitourinary: Positive for vaginal pain and dysuria Musculoskeletal: Negative for back pain. Skin: Negative for rash. Neurological: Negative for headaches, focal weakness or numbness.   ____________________________________________   PHYSICAL EXAM:  VITAL SIGNS: ED Triage Vitals  Enc Vitals Group     BP      Pulse      Resp      Temp      Temp src      SpO2      Weight      Height      Head Circumference      Peak Flow      Pain Score      Pain Loc      Pain Edu?      Excl. in GC?     Constitutional: Pleasant cooperative alert and oriented x4 nontoxic no diaphoresis Eyes: PERRL EOMI. Head: Atraumatic. Nose: No congestion/rhinnorhea. Mouth/Throat: No trismus Neck: No stridor.   Cardiovascular: Normal rate, regular rhythm. Grossly normal heart sounds.  Good peripheral circulation. Respiratory: Normal respiratory effort.  No retractions. Lungs CTAB and moving good air Gastrointestinal: Obese abdomen soft nontender Genitourinary exam performed with female nurse Erie NoeVanessa is a chaperone: Foley catheter in place.  She has what appears to be atrophic vaginitis and contact dermatitis primarily to her left labia majora but also to the right labia majora.  No evidence of infection but it does  appear dry and raw Musculoskeletal: No lower extremity edema   Neurologic:  Normal speech and language. No gross focal neurologic deficits are appreciated. Skin:  Skin is warm, dry and intact. No rash noted. Psychiatric: Mood and affect are normal. Speech and behavior are normal.    ____________________________________________   DIFFERENTIAL includes but not limited to  Vulvovaginitis, contact dermatitis, urinary tract infection, candidiasis, appendicitis, diverticulitis ____________________________________________   LABS (all labs ordered are listed, but only abnormal results are displayed)  Labs Reviewed  CBC WITH DIFFERENTIAL/PLATELET - Abnormal; Notable for the following components:      Result Value   RBC 3.79 (*)    Hemoglobin 11.9 (*)    All other components within normal limits  URINALYSIS, COMPLETE (UACMP) WITH MICROSCOPIC - Abnormal; Notable for the following components:   Color, Urine YELLOW (*)    APPearance CLOUDY (*)    Hgb urine dipstick MODERATE (*)    Protein, ur 100 (*)    Leukocytes, UA LARGE (*)    RBC / HPF >50 (*)    WBC, UA >50 (*)    Bacteria, UA RARE (*)    Non Squamous Epithelial PRESENT (*)    All other components within normal limits  COMPREHENSIVE METABOLIC PANEL - Abnormal; Notable for the following components:   Glucose, Bld 178 (*)    BUN 29 (*)    Creatinine, Ser 1.31 (*)    Total Protein 6.4 (*)    Albumin 3.0 (*)    GFR calc non Af Amer 38 (*)    GFR calc Af Amer 44 (*)    All other components within normal limits  URINE CULTURE  LIPASE, BLOOD    Lab work reviewed by me with urinalysis showing whites and rare bacteria.  No nitrites.  Does not appear to be acutely infected.  Her blood work shows CKD roughly at her baseline __________________________________________  EKG   ____________________________________________  RADIOLOGY   ____________________________________________   PROCEDURES  Procedure(s) performed:  no  Procedures  Critical Care performed: no  ____________________________________________   INITIAL IMPRESSION / ASSESSMENT AND PLAN / ED COURSE  Pertinent labs & imaging results that were available during my care of the patient were reviewed by me and considered in my medical decision making (see chart for details).   As part of my medical decision making, I reviewed the following data within the electronic MEDICAL RECORD NUMBER History obtained from family if available, nursing notes, old chart and ekg, as well as notes from prior ED visits.  I appreciate the EMS report of lower abdominal pain however the patient adamantly denies ever having abdominal pain she says that she had vulvar pain and some dysuria.  On chart review she has a longstanding history of urinary incontinence and formally required a suprapubic catheter however at some point recently was converted to Foley catheter.  She is currently asymptomatic.  On physical exam her vagina is clearly atrophic and I think a lot of her discomfort is likely related to the Foley catheter rubbing on already atrophied vulvar tissue.  I will treat her symptomatically with Urispas, hydrocortisone ointment topically on her vulva as well as give a trial of Pyridium.  Her urinalysis is pending but will likely show nitrites etc. but without any symptoms we will defer treatment and send a culture now.  I do anticipate discharge home this morning.      ____________________________________________   FINAL CLINICAL IMPRESSION(S) / ED DIAGNOSES  Final diagnoses:  Irritant contact dermatitis due to other chemical products      NEW MEDICATIONS STARTED DURING THIS VISIT:  New Prescriptions   HYDROCORTISONE 1 % OINTMENT    Apply 1 application topically 2 (two) times daily. Please apply liberally to your vulva twice a day to help prevent further irritation     Note:  This document was prepared using Dragon voice recognition software and may include  unintentional dictation errors.    Merrily Brittleifenbark, Tannor Pyon, MD 12/06/18 [phone]

## 2018-12-06 NOTE — ED Notes (Signed)
[facility]  CALLED  FOR  TRANSPORT 

## 2018-12-06 NOTE — Discharge Instructions (Addendum)
Fortunately today your blood work was reassuring.  I think your discomfort is largely related to the Foley catheter rubbing against your already atrophic vulva.  Please apply your prescribed ointment (it is also over-the-counter) twice a day to help prevent irritation and to help your vulva heal.  Follow-up with your primary care physician in 1 week for recheck and return to the emergency department sooner for any concerns.  It was a pleasure to take care of you today, and thank you for coming to our emergency department.  If you have any questions or concerns before leaving please ask the nurse to grab me and I'm more than happy to go through your aftercare instructions again.  If you have any concerns once you are home that you are not improving or are in fact getting worse before you can make it to your follow-up appointment, please do not hesitate to call 911 and come back for further evaluation.  Merrily BrittleNeil Erick Murin, MD  Results for orders placed or performed during the hospital encounter of 12/06/18  CBC with Differential  Result Value Ref Range   WBC 5.6 4.0 - 10.5 K/uL   RBC 3.79 (L) 3.87 - 5.11 MIL/uL   Hemoglobin 11.9 (L) 12.0 - 15.0 g/dL   HCT 81.136.7 91.436.0 - 78.246.0 %   MCV 96.8 80.0 - 100.0 fL   MCH 31.4 26.0 - 34.0 pg   MCHC 32.4 30.0 - 36.0 g/dL   RDW 95.613.2 21.311.5 - 08.615.5 %   Platelets 261 150 - 400 K/uL   nRBC 0.0 0.0 - 0.2 %   Neutrophils Relative % 61 %   Neutro Abs 3.5 1.7 - 7.7 K/uL   Lymphocytes Relative 27 %   Lymphs Abs 1.5 0.7 - 4.0 K/uL   Monocytes Relative 8 %   Monocytes Absolute 0.5 0.1 - 1.0 K/uL   Eosinophils Relative 2 %   Eosinophils Absolute 0.1 0.0 - 0.5 K/uL   Basophils Relative 1 %   Basophils Absolute 0.1 0.0 - 0.1 K/uL   Immature Granulocytes 1 %   Abs Immature Granulocytes 0.04 0.00 - 0.07 K/uL  Urinalysis, Complete w Microscopic  Result Value Ref Range   Color, Urine YELLOW (A) YELLOW   APPearance CLOUDY (A) CLEAR   Specific Gravity, Urine 1.010 1.005 -  1.030   pH 6.0 5.0 - 8.0   Glucose, UA NEGATIVE NEGATIVE mg/dL   Hgb urine dipstick MODERATE (A) NEGATIVE   Bilirubin Urine NEGATIVE NEGATIVE   Ketones, ur NEGATIVE NEGATIVE mg/dL   Protein, ur 578100 (A) NEGATIVE mg/dL   Nitrite NEGATIVE NEGATIVE   Leukocytes, UA LARGE (A) NEGATIVE   RBC / HPF >50 (H) 0 - 5 RBC/hpf   WBC, UA >50 (H) 0 - 5 WBC/hpf   Bacteria, UA RARE (A) NONE SEEN   Squamous Epithelial / LPF 11-20 0 - 5   WBC Clumps PRESENT    Mucus PRESENT    Non Squamous Epithelial PRESENT (A) NONE SEEN  Comprehensive metabolic panel  Result Value Ref Range   Sodium 140 135 - 145 mmol/L   Potassium 4.2 3.5 - 5.1 mmol/L   Chloride 108 98 - 111 mmol/L   CO2 24 22 - 32 mmol/L   Glucose, Bld 178 (H) 70 - 99 mg/dL   BUN 29 (H) 8 - 23 mg/dL   Creatinine, Ser 4.691.31 (H) 0.44 - 1.00 mg/dL   Calcium 9.2 8.9 - 62.910.3 mg/dL   Total Protein 6.4 (L) 6.5 - 8.1 g/dL   Albumin  3.0 (L) 3.5 - 5.0 g/dL   AST 23 15 - 41 U/L   ALT 17 0 - 44 U/L   Alkaline Phosphatase 92 38 - 126 U/L   Total Bilirubin 0.6 0.3 - 1.2 mg/dL   GFR calc non Af Amer 38 (L) >60 mL/min   GFR calc Af Amer 44 (L) >60 mL/min   Anion gap 8 5 - 15  Lipase, blood  Result Value Ref Range   Lipase 18 11 - 51 U/L

## 2018-12-07 LAB — URINE CULTURE

## 2019-06-06 ENCOUNTER — Encounter: Payer: Self-pay | Admitting: Emergency Medicine

## 2019-06-06 ENCOUNTER — Other Ambulatory Visit: Payer: Self-pay

## 2019-06-06 ENCOUNTER — Emergency Department
Admission: EM | Admit: 2019-06-06 | Discharge: 2019-06-06 | Disposition: A | Discharge status: Home / Self Care | Payer: Medicare Other | Attending: Emergency Medicine | Admitting: Emergency Medicine

## 2019-06-06 DIAGNOSIS — I509 Heart failure, unspecified: Secondary | ICD-10-CM | POA: Diagnosis not present

## 2019-06-06 DIAGNOSIS — Z7902 Long term (current) use of antithrombotics/antiplatelets: Secondary | ICD-10-CM | POA: Insufficient documentation

## 2019-06-06 DIAGNOSIS — E1122 Type 2 diabetes mellitus with diabetic chronic kidney disease: Secondary | ICD-10-CM | POA: Diagnosis not present

## 2019-06-06 DIAGNOSIS — N189 Chronic kidney disease, unspecified: Secondary | ICD-10-CM | POA: Insufficient documentation

## 2019-06-06 DIAGNOSIS — T839XXA Unspecified complication of genitourinary prosthetic device, implant and graft, initial encounter: Secondary | ICD-10-CM | POA: Insufficient documentation

## 2019-06-06 DIAGNOSIS — I130 Hypertensive heart and chronic kidney disease with heart failure and stage 1 through stage 4 chronic kidney disease, or unspecified chronic kidney disease: Secondary | ICD-10-CM | POA: Diagnosis not present

## 2019-06-06 DIAGNOSIS — Y732 Prosthetic and other implants, materials and accessory gastroenterology and urology devices associated with adverse incidents: Secondary | ICD-10-CM | POA: Diagnosis not present

## 2019-06-06 DIAGNOSIS — Z794 Long term (current) use of insulin: Secondary | ICD-10-CM | POA: Diagnosis not present

## 2019-06-06 DIAGNOSIS — Z79899 Other long term (current) drug therapy: Secondary | ICD-10-CM | POA: Insufficient documentation

## 2019-06-06 NOTE — ED Provider Notes (Signed)
[facility] Emergency Department Provider Note   ____________________________________________   I have reviewed the triage vital signs and the nursing notes.   HISTORY  Chief Complaint Foley catheter fell out  History limited by: Not Limited   HPI Pamela [name] is a 85 y.o. female who presents to the emergency department today after her foley catheter accidentally came out. The patient states that it happened within two hours of presentation to the emergency department. The patient says that she has had a foley catheter for a long time.   Records reviewed. Per medical record review patient has a history of indwelling foley catheter.   Past Medical History:  Diagnosis Date  . A-fib (HCC) 08/28/2014   Overview:  Overview:  Diastolic dysfunction on ECHO 04/2012-[facility]   . Abnormal gait 02/08/2012  . Abscess or cellulitis of leg 04/18/2012  . Absence of bladder continence 07/29/2012  . Acid reflux 02/08/2012  . Acute kidney failure (HCC) 05/15/2012  . Afib, paroxysmal, with RVR at times with BBB 05/13/2012  . Allergic drug rash 05/15/2012  . Anemia 05/13/2012  . 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 Benign essential HTN 02/08/2012  . Bladder infection, chronic 06/29/2013  . BP (high blood pressure) 05/12/2012  . Bursitis of knee 08/07/2014  . Cellulitis in diabetic foot (HCC) 05/12/2012  . CHF (congestive heart failure) (HCC)   . Chronic kidney disease 06/01/2012   Overview:  Overview:   12/2012- Renal US, simple L cyst. UIEP normal   . Clinical depression 08/28/2014  . CN (constipation) 03/17/2012  . Dermatitis due to drug reaction 05/15/2012  . Diabetes mellitus   . Diastolic dysfunction, left ventricle 05/14/2012  . DM (diabetes mellitus) (HCC) 05/12/2012  . Dysrhythmia   . Dysuria   . Edema leg 07/02/2014  . Enthesopathy of knee 08/07/2014  . Excess fluid volume 05/20/2012  . FOM (frequency of micturition) 07/29/2012  . Frank hematuria 09/27/2014  . Gangrene  (HCC)   . GERD (gastroesophageal reflux disease)   . Heart disease   . High cholesterol   . HTN (hypertension) 05/12/2012  . Hypercholesterolemia 05/12/2012  . Hypertension   . Incomplete bladder emptying 07/29/2012  . Left bundle branch block (LBBB)   . Open wound of knee, leg (except thigh), and ankle 06/01/2012  . 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   Overview:  Overview:  Several years ago, allergic reaction to sulfa antibiotic Several years ago, allergic reaction to sulfa antibiotic     Patient Active Problem List   Diagnosis Date Noted  . NSTEMI (non-ST elevated myocardial infarction) (HCC) 11/28/2018  . UTI (urinary tract infection) 11/28/2017  . Pelvic muscle wasting 01/26/2017  . Cystocele, midline 01/26/2017  . Urinary frequency 12/26/2015  . Incontinence 12/07/2015  . Cystocele 12/07/2015  . Tinea cruris 12/07/2015  . Recurrent UTI 08/11/2015  . Vaginal atrophy 08/11/2015  . 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 Discoordination 05/08/2015  . Frank hematuria 09/27/2014  . Clinical depression 08/28/2014  . Major depressive disorder with single episode 08/28/2014  . Bursitis of knee 08/07/2014  . Enthesopathy of knee 08/07/2014  . Edema leg 07/02/2014  . Bladder infection, chronic 06/29/2013  . Incomplete bladder emptying 07/29/2012  . FOM (frequency of micturition) 07/29/2012  . Absence of bladder continence 07/29/2012  . Chronic kidney disease 06/01/2012  . Open wound of knee, leg (except thigh), and ankle 06/01/2012  . Skin rash 05/20/2012  . Acute kidney failure (HCC) 05/15/2012  . Allergic drug rash 05/15/2012  .  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  . Dermatitis due to drug taken internally 05/15/2012  . Dermatitis due to drug reaction 05/15/2012  . Acute renal failure (HCC) 05/15/2012  . Diastolic dysfunction, left ventricle 05/14/2012  . PAF (paroxysmal atrial fibrillation) (HCC) 05/13/2012  . Anemia 05/13/2012  . Cellulitis in diabetic foot (HCC)  05/12/2012  . HTN (hypertension) 05/12/2012  . Hypercholesterolemia 05/12/2012  . Abscess or cellulitis of leg 04/18/2012  . Diabetes mellitus (HCC) 04/18/2012  . CN (constipation) 03/17/2012  . Abnormal gait 02/08/2012  . Benign essential HTN 02/08/2012  . GERD (gastroesophageal reflux disease) 02/08/2012    Past Surgical History:  Procedure Laterality Date  . ABDOMINAL HYSTERECTOMY    . BLADDER SURGERY    . cataract surgery    . ENDOVENOUS ABLATION SAPHENOUS VEIN W/ LASER    . LEFT HEART CATH AND CORONARY ANGIOGRAPHY N/A 11/29/2018   Procedure: LEFT HEART CATH AND CORONARY ANGIOGRAPHY;  Surgeon: Lamar BlinksKowalski, Bruce J, MD;  Location: [facility] INVASIVE CV LAB;  Service: Cardiovascular;  Laterality: N/A;  . NASAL SINUS SURGERY    . TOE AMPUTATION    . TONSILLECTOMY      Prior to Admission medications   Medication Sig Start Date End Date Taking? Authorizing Provider  amiodarone (PACERONE) 200 MG tablet Take 200 mg by mouth daily.  12/16/15   [provider]  amitriptyline (ELAVIL) 50 MG tablet Take 50 mg by mouth at bedtime.  03/31/16   [provider]  amLODipine (NORVASC) 5 MG tablet Take 5 mg by mouth daily.    [provider]  atorvastatin (LIPITOR) 40 MG tablet Take 40 mg by mouth daily.    [provider]  bisacodyl (BISACODYL) 5 MG EC tablet Take 1 tablet (5 mg total) by mouth daily as needed for moderate constipation. 11/30/18   Shaune Pollackhen, Qing, MD  clopidogrel (PLAVIX) 75 MG tablet Take 1 tablet (75 mg total) by mouth daily. 11/30/18   Shaune Pollackhen, Qing, MD  hydrocortisone 1 % ointment Apply 1 application topically 2 (two) times daily. Please apply liberally to your vulva twice a day to help prevent further irritation 12/06/18   Merrily Brittleifenbark, Neil, MD  insulin lispro protamine-lispro (HUMALOG 75/25 MIX) (75-25) 100 UNIT/ML SUSP injection Inject 20 Units into the skin daily.    [provider]  insulin lispro protamine-lispro (HUMALOG 75/25 MIX) (75-25) 100  UNIT/ML SUSP injection Inject 18 Units into the skin at bedtime.    [provider]  memantine (NAMENDA XR) 7 MG CP24 24 hr capsule Take 7 mg by mouth daily.    [provider]  metoprolol tartrate (LOPRESSOR) 25 MG tablet Take 25 mg by mouth 2 (two) times daily.    [provider]  nitrofurantoin, macrocrystal-monohydrate, (MACROBID) 100 MG capsule Take 1 capsule (100 mg total) by mouth every 12 (twelve) hours. Patient not taking: Reported on 11/28/2018 03/18/18   Michiel CowboyMcGowan, Shannon A, PA-C  nitroGLYCERIN (NITROSTAT) 0.4 MG SL tablet Place 1 tablet (0.4 mg total) under the tongue every 5 (five) minutes as needed for chest pain. 11/30/18   Shaune Pollackhen, Qing, MD  NOVOLOG MIX 70/30 FLEXPEN (70-30) 100 UNIT/ML FlexPen Inject 0.12 mLs (12 Units total) into the skin 2 (two) times daily with a meal. Patient not taking: Reported on 11/28/2018 11/30/17   Adrian SaranMody, Sital, MD  pantoprazole (PROTONIX) 40 MG tablet Take 40 mg by mouth daily.    [provider]  vitamin B-12 (CYANOCOBALAMIN) 1000 MCG tablet Take 1,000 mcg by mouth daily.    [provider]    Allergies Ciprofloxacin, Sulfamethoxazole-trimethoprim, Augmentin [amoxicillin-pot clavulanate], Hydrochlorothiazide, Levofloxacin, Losartan potassium-hctz, Nitrofurantoin, Sulfa antibiotics, Sulfasalazine, and Levaquin [levofloxacin in d5w]  Family History  Problem Relation Age of Onset  . Diabetes Mellitus II Mother   . Heart disease Mother   . Lung cancer Father   . Diabetes Paternal Grandmother   . Diabetes Maternal Grandmother   . Kidney disease Neg Hx   . Prostate cancer Neg Hx   . Bladder Cancer Neg Hx     Social History Social History   Tobacco Use  . Smoking status: Never Smoker  . Smokeless tobacco: Never Used  Substance Use Topics  . Alcohol use: No    Alcohol/week: 0.0 standard drinks  . Drug use: No    Review of Systems Constitutional: No fever/chills Eyes: No visual changes. ENT: No sore  throat. Cardiovascular: Denies chest pain. Respiratory: Denies shortness of breath. Gastrointestinal: No abdominal pain.  No nausea, no vomiting.  No diarrhea.   Genitourinary: Foley catheter fell out. Musculoskeletal: Negative for back pain. Skin: Negative for rash. Neurological: Negative for headaches, focal weakness or numbness.  ____________________________________________   PHYSICAL EXAM:  Constitutional: Alert and oriented.  Eyes: Conjunctivae are normal.  ENT      Head: Normocephalic and atraumatic.      Nose: No congestion/rhinnorhea.      Mouth/Throat: Mucous membranes are moist.      Neck: No stridor. Hematological/Lymphatic/Immunilogical: No cervical lymphadenopathy. Respiratory: Normal respiratory effort without tachypnea nor retractions. Gastrointestinal: Soft and non tender. No rebound. No guarding.  Genitourinary: Deferred Musculoskeletal: Lower extremity edema.  Neurologic:  Normal speech and language. No gross focal neurologic deficits are appreciated.  Skin:  Skin is warm, dry and intact. No rash noted. Psychiatric: Mood and affect are normal. Speech and behavior are normal. Patient exhibits appropriate insight and judgment.  ____________________________________________    LABS (pertinent positives/negatives)  None  ____________________________________________   EKG  None  ____________________________________________    RADIOLOGY  None  ____________________________________________   PROCEDURES  Procedures  ____________________________________________   INITIAL IMPRESSION / ASSESSMENT AND PLAN / ED COURSE  Pertinent labs & imaging results that were available during my care of the patient were reviewed by me and considered in my medical decision making (see chart for details).   Patient presented to the emergency department tonight after foley catheter was accidentally removed. Unclear why she was sent emergently to the ER. She says she  does have baseline incontinence. Nonetheless the foley catheter was replaced. Will discharge back to living facility. ____________________________________________   FINAL CLINICAL IMPRESSION(S) / ED DIAGNOSES  Final diagnoses:  Problem with Foley catheter, initial encounter [facility])     Note: This dictation was prepared with Dragon dictation. Any transcriptional errors that result from this process are unintentional     Nance Pear, MD 06/06/19 [id number]

## 2019-06-06 NOTE — ED Triage Notes (Signed)
Patient arrives from [place] for reinsertion of Foley catheter. Apparently patient was sitting on toilet this evening and the catheter came out. No pain, NAD

## 2019-06-06 NOTE — Discharge Instructions (Addendum)
Please seek medical attention for any high fevers, chest pain, shortness of breath, change in behavior, persistent vomiting, bloody stool or any other new or concerning symptoms.  

## 2019-06-10 ENCOUNTER — Encounter: Payer: Self-pay | Admitting: Emergency Medicine

## 2019-06-10 ENCOUNTER — Emergency Department
Admission: EM | Admit: 2019-06-10 | Discharge: 2019-06-10 | Disposition: A | Discharge status: Skilled Nursing Facility | Payer: Medicare Other | Attending: Emergency Medicine | Admitting: Emergency Medicine

## 2019-06-10 ENCOUNTER — Other Ambulatory Visit: Payer: Self-pay

## 2019-06-10 ENCOUNTER — Emergency Department: Payer: Medicare Other

## 2019-06-10 DIAGNOSIS — Y92129 Unspecified place in nursing home as the place of occurrence of the external cause: Secondary | ICD-10-CM | POA: Insufficient documentation

## 2019-06-10 DIAGNOSIS — Z7901 Long term (current) use of anticoagulants: Secondary | ICD-10-CM | POA: Diagnosis not present

## 2019-06-10 DIAGNOSIS — Y939 Activity, unspecified: Secondary | ICD-10-CM | POA: Diagnosis not present

## 2019-06-10 DIAGNOSIS — Z79899 Other long term (current) drug therapy: Secondary | ICD-10-CM | POA: Insufficient documentation

## 2019-06-10 DIAGNOSIS — W2203XA Walked into furniture, initial encounter: Secondary | ICD-10-CM | POA: Diagnosis not present

## 2019-06-10 DIAGNOSIS — Y999 Unspecified external cause status: Secondary | ICD-10-CM | POA: Diagnosis not present

## 2019-06-10 DIAGNOSIS — I509 Heart failure, unspecified: Secondary | ICD-10-CM | POA: Insufficient documentation

## 2019-06-10 DIAGNOSIS — I130 Hypertensive heart and chronic kidney disease with heart failure and stage 1 through stage 4 chronic kidney disease, or unspecified chronic kidney disease: Secondary | ICD-10-CM | POA: Insufficient documentation

## 2019-06-10 DIAGNOSIS — S92354B Nondisplaced fracture of fifth metatarsal bone, right foot, initial encounter for open fracture: Secondary | ICD-10-CM | POA: Diagnosis not present

## 2019-06-10 DIAGNOSIS — S91114A Laceration without foreign body of right lesser toe(s) without damage to nail, initial encounter: Secondary | ICD-10-CM | POA: Insufficient documentation

## 2019-06-10 DIAGNOSIS — Z23 Encounter for immunization: Secondary | ICD-10-CM | POA: Diagnosis not present

## 2019-06-10 DIAGNOSIS — N189 Chronic kidney disease, unspecified: Secondary | ICD-10-CM | POA: Insufficient documentation

## 2019-06-10 DIAGNOSIS — S99921A Unspecified injury of right foot, initial encounter: Secondary | ICD-10-CM | POA: Diagnosis present

## 2019-06-10 LAB — URINALYSIS, COMPLETE (UACMP) WITH MICROSCOPIC
Bilirubin Urine: NEGATIVE
Glucose, UA: NEGATIVE mg/dL
Hgb urine dipstick: NEGATIVE
Ketones, ur: NEGATIVE mg/dL
Nitrite: POSITIVE — AB
Protein, ur: 30 mg/dL — AB
Specific Gravity, Urine: 1.013 (ref 1.005–1.030)
pH: 8 (ref 5.0–8.0)

## 2019-06-10 MED ORDER — AZITHROMYCIN 250 MG PO TABS: ORAL_TABLET | ORAL | 0 refills | Status: DC

## 2019-06-10 MED ORDER — TETANUS-DIPHTH-ACELL PERTUSSIS 5-2.5-18.5 LF-MCG/0.5 IM SUSP
0.5000 mL | Freq: Once | INTRAMUSCULAR | Status: AC
  Administered 2019-06-10: 17:00:00 0.5 mL via INTRAMUSCULAR
  Filled 2019-06-10: qty 0.5

## 2019-06-10 MED ORDER — LIDOCAINE HCL (PF) 1 % IJ SOLN
5.0000 mL | Freq: Once | INTRAMUSCULAR | Status: DC
  Filled 2019-06-10: qty 5

## 2019-06-10 NOTE — ED Triage Notes (Signed)
Pt to ER via EMS from [place] with c/o laceration to right foot.  Pt hit toes while walking.  Per EMS pt has significant amount of bleeding.  Pt currently has dressing in place that is dry and intact.

## 2019-06-10 NOTE — ED Notes (Signed)
Called [facility] for transport to [place]  

## 2019-06-10 NOTE — Discharge Instructions (Signed)
Follow-up with your primary care provider if any signs of infection.  You may need to follow-up with podiatry at [facility] however her fracture of her fifth metatarsal is nondisplaced and no surgery is indicated.  She was placed in a postop shoe for added support.  The sutures that were placed in her foot do not have to be removed as they will dissolve on their own.  Keep the area clean and dry and clean with mild soap and water daily.  A prescription for Zithromax was sent with her for infection prevention since this is considered an open fracture.  Continue with her regular medications as prescribed by her primary care provider.

## 2019-06-10 NOTE — ED Provider Notes (Signed)
[facility] Emergency Department Provider Note   ____________________________________________   First MD Initiated Contact with Patient 06/10/19 1538     (approximate)  I have reviewed the triage vital signs and the nursing notes.   HISTORY  Chief Complaint Laceration   HPI Marlou SaGloria P Morrow is a 85 y.o. female presents to the ED via EMS from [facility].  Patient has a laceration to her right foot that she states happened while she was walking with her walker.  She denies hitting her foot on the walker but believes that it was with a piece of furniture in her room.  She denies any other injuries.  She is uncertain to her last tetanus.  She denies any pain as she has neuropathy in her feet.     Past Medical History:  Diagnosis Date  . A-fib (HCC) 08/28/2014   Overview:  Overview:  Diastolic dysfunction on ECHO 04/2012-[facility]   . Abnormal gait 02/08/2012  . Abscess or cellulitis of leg 04/18/2012  . Absence of bladder continence 07/29/2012  . Acid reflux 02/08/2012  . Acute kidney failure (HCC) 05/15/2012  . Afib, paroxysmal, with RVR at times with BBB 05/13/2012  . Allergic drug rash 05/15/2012  . Anemia 05/13/2012  . 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 Benign essential HTN 02/08/2012  . Bladder infection, chronic 06/29/2013  . BP (high blood pressure) 05/12/2012  . Bursitis of knee 08/07/2014  . Cellulitis in diabetic foot (HCC) 05/12/2012  . CHF (congestive heart failure) (HCC)   . Chronic kidney disease 06/01/2012   Overview:  Overview:   12/2012- Renal US, simple L cyst. UIEP normal   . Clinical depression 08/28/2014  . CN (constipation) 03/17/2012  . Dermatitis due to drug reaction 05/15/2012  . Diabetes mellitus   . Diastolic dysfunction, left ventricle 05/14/2012  . DM (diabetes mellitus) (HCC) 05/12/2012  . Dysrhythmia   . Dysuria   . Edema leg 07/02/2014  . Enthesopathy of knee 08/07/2014  . Excess fluid volume 05/20/2012  . FOM  (frequency of micturition) 07/29/2012  . Frank hematuria 09/27/2014  . Gangrene (HCC)   . GERD (gastroesophageal reflux disease)   . Heart disease   . High cholesterol   . HTN (hypertension) 05/12/2012  . Hypercholesterolemia 05/12/2012  . Hypertension   . Incomplete bladder emptying 07/29/2012  . Left bundle branch block (LBBB)   . Open wound of knee, leg (except thigh), and ankle 06/01/2012  . 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   Overview:  Overview:  Several years ago, allergic reaction to sulfa antibiotic Several years ago, allergic reaction to sulfa antibiotic     Patient Active Problem List   Diagnosis Date Noted  . NSTEMI (non-ST elevated myocardial infarction) (HCC) 11/28/2018  . UTI (urinary tract infection) 11/28/2017  . Pelvic muscle wasting 01/26/2017  . Cystocele, midline 01/26/2017  . Urinary frequency 12/26/2015  . Incontinence 12/07/2015  . Cystocele 12/07/2015  . Tinea cruris 12/07/2015  . Recurrent UTI 08/11/2015  . Vaginal atrophy 08/11/2015  . 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 Discoordination 05/08/2015  . Frank hematuria 09/27/2014  . Clinical depression 08/28/2014  . Major depressive disorder with single episode 08/28/2014  . Bursitis of knee 08/07/2014  . Enthesopathy of knee 08/07/2014  . Edema leg 07/02/2014  . Bladder infection, chronic 06/29/2013  . Incomplete bladder emptying 07/29/2012  . FOM (frequency of micturition) 07/29/2012  . Absence of bladder continence 07/29/2012  . Chronic kidney disease 06/01/2012  . Open wound of  knee, leg (except thigh), and ankle 06/01/2012  . Skin rash 05/20/2012  . Acute kidney failure (HCC) 05/15/2012  . Allergic drug rash 05/15/2012  . 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  . Dermatitis due to drug taken internally 05/15/2012  . Dermatitis due to drug reaction 05/15/2012  . Acute renal failure (HCC) 05/15/2012  . Diastolic dysfunction, left ventricle 05/14/2012  . PAF (paroxysmal atrial  fibrillation) (HCC) 05/13/2012  . Anemia 05/13/2012  . Cellulitis in diabetic foot (HCC) 05/12/2012  . HTN (hypertension) 05/12/2012  . Hypercholesterolemia 05/12/2012  . Abscess or cellulitis of leg 04/18/2012  . Diabetes mellitus (HCC) 04/18/2012  . CN (constipation) 03/17/2012  . Abnormal gait 02/08/2012  . Benign essential HTN 02/08/2012  . GERD (gastroesophageal reflux disease) 02/08/2012    Past Surgical History:  Procedure Laterality Date  . ABDOMINAL HYSTERECTOMY    . BLADDER SURGERY    . cataract surgery    . ENDOVENOUS ABLATION SAPHENOUS VEIN W/ LASER    . LEFT HEART CATH AND CORONARY ANGIOGRAPHY N/A 11/29/2018   Procedure: LEFT HEART CATH AND CORONARY ANGIOGRAPHY;  Surgeon: Lamar Blinks, MD;  Location: [facility] INVASIVE CV LAB;  Service: Cardiovascular;  Laterality: N/A;  . NASAL SINUS SURGERY    . TOE AMPUTATION    . TONSILLECTOMY      Prior to Admission medications   Medication Sig Start Date End Date Taking? Authorizing Provider  amiodarone (PACERONE) 200 MG tablet Take 200 mg by mouth daily.  12/16/15   [provider]  amitriptyline (ELAVIL) 50 MG tablet Take 50 mg by mouth at bedtime.  03/31/16   [provider]  amLODipine (NORVASC) 5 MG tablet Take 5 mg by mouth daily.    [provider]  atorvastatin (LIPITOR) 40 MG tablet Take 40 mg by mouth daily.    [provider]  azithromycin (ZITHROMAX Z-PAK) 250 MG tablet Take 2 tablets (500 mg) on  Day 1,  followed by 1 tablet (250 mg) once daily on Days 2 through 5. 06/10/19   Bridget Hartshorn L, PA-C  bisacodyl (BISACODYL) 5 MG EC tablet Take 1 tablet (5 mg total) by mouth daily as needed for moderate constipation. 11/30/18   Shaune Pollack, MD  clopidogrel (PLAVIX) 75 MG tablet Take 1 tablet (75 mg total) by mouth daily. 11/30/18   Shaune Pollack, MD  hydrocortisone 1 % ointment Apply 1 application topically 2 (two) times daily. Please apply liberally to your vulva twice a day to help prevent  further irritation 12/06/18   Merrily Brittle, MD  insulin lispro protamine-lispro (HUMALOG 75/25 MIX) (75-25) 100 UNIT/ML SUSP injection Inject 20 Units into the skin daily.    [provider]  insulin lispro protamine-lispro (HUMALOG 75/25 MIX) (75-25) 100 UNIT/ML SUSP injection Inject 18 Units into the skin at bedtime.    [provider]  memantine (NAMENDA XR) 7 MG CP24 24 hr capsule Take 7 mg by mouth daily.    [provider]  metoprolol tartrate (LOPRESSOR) 25 MG tablet Take 25 mg by mouth 2 (two) times daily.    [provider]  nitrofurantoin, macrocrystal-monohydrate, (MACROBID) 100 MG capsule Take 1 capsule (100 mg total) by mouth every 12 (twelve) hours. Patient not taking: Reported on 11/28/2018 03/18/18   Michiel Cowboy A, PA-C  nitroGLYCERIN (NITROSTAT) 0.4 MG SL tablet Place 1 tablet (0.4 mg total) under the tongue every 5 (five) minutes as needed for chest pain. 11/30/18   Shaune Pollack, MD  NOVOLOG MIX 70/30 FLEXPEN (70-30) 100 UNIT/ML  FlexPen Inject 0.12 mLs (12 Units total) into the skin 2 (two) times daily with a meal. Patient not taking: Reported on 11/28/2018 11/30/17   Adrian SaranMody, Sital, MD  pantoprazole (PROTONIX) 40 MG tablet Take 40 mg by mouth daily.    [provider]  vitamin B-12 (CYANOCOBALAMIN) 1000 MCG tablet Take 1,000 mcg by mouth daily.    [provider]    Allergies Ciprofloxacin, Sulfamethoxazole-trimethoprim, Augmentin [amoxicillin-pot clavulanate], Hydrochlorothiazide, Levofloxacin, Losartan potassium-hctz, Nitrofurantoin, Sulfa antibiotics, Sulfasalazine, and Levaquin [levofloxacin in d5w]  Family History  Problem Relation Age of Onset  . Diabetes Mellitus II Mother   . Heart disease Mother   . Lung cancer Father   . Diabetes Paternal Grandmother   . Diabetes Maternal Grandmother   . Kidney disease Neg Hx   . Prostate cancer Neg Hx   . Bladder Cancer Neg Hx     Social History Social History   Tobacco  Use  . Smoking status: Never Smoker  . Smokeless tobacco: Never Used  Substance Use Topics  . Alcohol use: No    Alcohol/week: 0.0 standard drinks  . Drug use: No    Review of Systems Constitutional: No fever/chills Cardiovascular: Denies chest pain. Respiratory: Denies shortness of breath. Musculoskeletal: Positive contusion right foot. Skin: Positive for laceration right foot. Neurological: Negative for headaches.  Positive for neuropathy bilateral feet.  ____________________________________________   PHYSICAL EXAM:  VITAL SIGNS: ED Triage Vitals  Enc Vitals Group     BP 06/10/19 1531 (!) 155/57     Pulse Rate 06/10/19 1531 (!) 53     Resp 06/10/19 1531 18     Temp 06/10/19 1531 98.3 F (36.8 C)     Temp Source 06/10/19 1531 Oral     SpO2 06/10/19 1531 98 %     Weight 06/10/19 1532 180 lb (81.6 kg)     Height 06/10/19 1532 5\' 5"  (1.651 m)     Head Circumference --      Peak Flow --      Pain Score --      Pain Loc --      Pain Edu? --      Excl. in GC? --    Constitutional: Alert and oriented. Well appearing and in no acute distress.  Patient is hard of hearing. Eyes: Conjunctivae are normal.  Head: Atraumatic. Neck: No stridor.   Cardiovascular: Normal rate, regular rhythm. Grossly normal heart sounds.  Good peripheral circulation. Respiratory: Normal respiratory effort.  No retractions. Lungs CTAB. Gastrointestinal: Soft and nontender. No distention.  Musculoskeletal: Patient has surgically amputated great toe on the right foot.  There is laceration to the web spacing between the third and fourth toe and also between the fourth and fifth toe.  Bleeding is under control at this time.  No gross deformity or foreign body is noted. Neurologic:  Normal speech and language. No gross focal neurologic deficits are appreciated. No gait instability. Skin:  Skin is warm, dry and intact. No rash noted. Psychiatric: Mood and affect are normal. Speech and behavior are  normal.  ____________________________________________   LABS (all labs ordered are listed, but only abnormal results are displayed)  Labs Reviewed  URINALYSIS, COMPLETE (UACMP) WITH MICROSCOPIC - Abnormal; Notable for the following components:      Result Value   Color, Urine YELLOW (*)    APPearance CLOUDY (*)    Protein, ur 30 (*)    Nitrite POSITIVE (*)    Leukocytes,Ua LARGE (*)    Bacteria, UA  RARE (*)    All other components within normal limits  URINE CULTURE    RADIOLOGY   Official radiology report(s): Dg Foot Complete Right  Result Date: 06/10/2019 CLINICAL DATA:  Laceration between fourth and fifth digits. EXAM: RIGHT FOOT COMPLETE - 3+ VIEW COMPARISON:  Apr 18, 2012 FINDINGS: The great toe has been amputated, stable. No foreign bodies are identified. Mild angulation and cortical irregularity is associated with the distal fifth metatarsal. No other acute abnormalities. IMPRESSION: Fracture through the proximal aspect of the proximal fifth phalanx and the distal fifth metatarsal with mild angulation. No foreign bodies identified. Electronically Signed   By: Dorise Bullion III M.D   On: 06/10/2019 16:52    ____________________________________________   PROCEDURES  Procedure(s) performed (including Critical Care):  Marland KitchenMarland KitchenLaceration Repair  Date/Time: 06/10/2019 4:24 PM Performed by: Johnn Hai, PA-C Authorized by: Johnn Hai, PA-C   Consent:    Consent obtained:  Verbal   Consent given by:  Patient   Risks discussed:  Pain and poor wound healing Anesthesia (see MAR for exact dosages):    Anesthesia method:  Local infiltration   Local anesthetic:  Lidocaine 1% w/o epi Laceration details:    Location:  Toe   Toe location:  R fourth toe   Length (cm):  1.5 Repair type:    Repair type:  Simple Pre-procedure details:    Preparation:  Imaging obtained to evaluate for foreign bodies and patient was prepped and draped in usual sterile  fashion Exploration:    Hemostasis achieved with:  Direct pressure   Contaminated: no   Treatment:    Area cleansed with:  Saline   Amount of cleaning:  Standard   Irrigation solution:  Sterile saline   Irrigation volume:  30 cc   Irrigation method:  Syringe Skin repair:    Repair method:  Sutures   Suture size:  5-0   Suture material:  Fast-absorbing gut   Suture technique:  Simple interrupted   Number of sutures:  4 Approximation:    Approximation:  Close Post-procedure details:    Dressing:  Non-adherent dressing   Patient tolerance of procedure:  Tolerated well, no immediate complications .Marland KitchenLaceration Repair  Date/Time: 06/10/2019 4:32 PM Performed by: Johnn Hai, PA-C Authorized by: Johnn Hai, PA-C   Consent:    Consent obtained:  Verbal   Consent given by:  Patient   Risks discussed:  Pain and poor wound healing Anesthesia (see MAR for exact dosages):    Anesthesia method:  Local infiltration   Local anesthetic:  Lidocaine 1% w/o epi Laceration details:    Location:  Toe   Toe location:  R little toe   Length (cm):  1 Repair type:    Repair type:  Simple Pre-procedure details:    Preparation:  Patient was prepped and draped in usual sterile fashion and imaging obtained to evaluate for foreign bodies Exploration:    Hemostasis achieved with:  Direct pressure   Contaminated: no   Treatment:    Area cleansed with:  Saline   Amount of cleaning:  Standard   Irrigation solution:  Sterile saline   Irrigation volume:  30 cc   Irrigation method:  Syringe   Visualized foreign bodies/material removed: no   Skin repair:    Repair method:  Sutures   Suture size:  5-0   Suture material:  Fast-absorbing gut   Suture technique:  Simple interrupted   Number of sutures:  2 Approximation:    Approximation:  Close Post-procedure details:    Dressing:  Non-adherent dressing   Patient tolerance of procedure:  Tolerated well, no immediate  complications     ____________________________________________   INITIAL IMPRESSION / ASSESSMENT AND PLAN / ED COURSE  As part of my medical decision making, I reviewed the following data within the electronic MEDICAL RECORD NUMBER Notes from prior ED visits and [place] Controlled Substance Database  85 year old female presents to the ED via EMS from [facility] with a laceration to her right foot.  Patient states that she believes that she hit her foot on a piece of furniture instead of the walker that she uses to ambulate with.  She denies any head injury or loss of consciousness.  X-ray of her foot did show a fracture to her fifth metatarsal that was nondisplaced.  Lacerations were repaired with Vicryl so that patient did not have to return to have sutures removed.  She was also placed on Zithromax for infection prophylaxis as she has taken this in the past without any complications..  Patient is to follow-up with her PCP and also Dr. Alberteen Spindleline who [facility] podiat[facility].  She is return to the emergency department if any signs of infection or urgent concerns.  ____________________________________________   FINAL CLINICAL IMPRESSION(S) / ED DIAGNOSES  Final diagnoses:  Open nondisplaced fracture of fifth metatarsal bone of right foot, initial encounter  Laceration of fourth toe of right foot, initial encounter  Laceration of fifth toe of right foot, initial encounter     ED Discharge Orders         Ordered    azithromycin (ZITHROMAX Z-PAK) 250 MG tablet     06/10/19 1732           Note:  This document was prepared using Dragon voice recognition software and may include unintentional dictation errors.    Tommi RumpsSummers, Rhonda L, PA-C 06/11/19 0010    Emily FilbertWilliams, Jonathan E, MD 06/13/19 [phone]

## 2019-06-14 ENCOUNTER — Telehealth: Payer: Self-pay

## 2019-06-14 NOTE — Telephone Encounter (Signed)
Incoming call from pt's home health nurse. She questions if pt needs to be on additional antibiotics based on recent UA. Dr.Stoioff reviewed pt recent ua and cx from the hospital. He states that no additional antibiotics are needed as pt has a chronic catheter and is asymptomatic. Caregiver questions if patient may start cranberry supplement gave verbal ok per [facility] they will fax written order over to be signed.

## 2019-06-15 ENCOUNTER — Other Ambulatory Visit: Payer: Self-pay

## 2019-06-15 LAB — URINE CULTURE: Culture: >=100000 — AB

## 2019-06-16 NOTE — Progress Notes (Signed)
Brief Pharmacy Note  Pharmacist reviewed ED culture report. Patient with > 100,000 E. coli and 80,000 K. pneumoniae. Patient presented with laceration on foot after hitting on a piece of furniture. No urinary complaints. Sent out with azithromycin for infection prevention for wound on foot.  Reviewed chart. Per note from 7/15, Dr. Bernardo Heater aware of UA/culture. No need to treat as pt has chronic catheter and was asymptomatic. Expect colonization.  Dorena Bodo, PharmD Clinical Pharmacist

## 2019-08-30 ENCOUNTER — Other Ambulatory Visit: Payer: Self-pay

## 2019-08-30 ENCOUNTER — Emergency Department
Admission: EM | Admit: 2019-08-30 | Discharge: 2019-08-30 | Disposition: A | Discharge status: Skilled Nursing Facility | Payer: Medicare Other | Attending: Emergency Medicine | Admitting: Emergency Medicine

## 2019-08-30 DIAGNOSIS — Z79899 Other long term (current) drug therapy: Secondary | ICD-10-CM | POA: Diagnosis not present

## 2019-08-30 DIAGNOSIS — N390 Urinary tract infection, site not specified: Secondary | ICD-10-CM | POA: Insufficient documentation

## 2019-08-30 DIAGNOSIS — T839XXA Unspecified complication of genitourinary prosthetic device, implant and graft, initial encounter: Secondary | ICD-10-CM

## 2019-08-30 DIAGNOSIS — R319 Hematuria, unspecified: Secondary | ICD-10-CM | POA: Insufficient documentation

## 2019-08-30 DIAGNOSIS — I130 Hypertensive heart and chronic kidney disease with heart failure and stage 1 through stage 4 chronic kidney disease, or unspecified chronic kidney disease: Secondary | ICD-10-CM | POA: Insufficient documentation

## 2019-08-30 DIAGNOSIS — E1122 Type 2 diabetes mellitus with diabetic chronic kidney disease: Secondary | ICD-10-CM | POA: Diagnosis not present

## 2019-08-30 DIAGNOSIS — Z7902 Long term (current) use of antithrombotics/antiplatelets: Secondary | ICD-10-CM | POA: Insufficient documentation

## 2019-08-30 DIAGNOSIS — Z436 Encounter for attention to other artificial openings of urinary tract: Secondary | ICD-10-CM | POA: Diagnosis present

## 2019-08-30 DIAGNOSIS — I509 Heart failure, unspecified: Secondary | ICD-10-CM | POA: Diagnosis not present

## 2019-08-30 DIAGNOSIS — N189 Chronic kidney disease, unspecified: Secondary | ICD-10-CM | POA: Diagnosis not present

## 2019-08-30 DIAGNOSIS — Z794 Long term (current) use of insulin: Secondary | ICD-10-CM | POA: Diagnosis not present

## 2019-08-30 LAB — URINALYSIS, COMPLETE (UACMP) WITH MICROSCOPIC
Bilirubin Urine: NEGATIVE
Glucose, UA: NEGATIVE mg/dL
Ketones, ur: NEGATIVE mg/dL
Nitrite: POSITIVE — AB
Protein, ur: NEGATIVE mg/dL
Specific Gravity, Urine: 1.009 (ref 1.005–1.030)
WBC, UA: >50 WBC/hpf — ABNORMAL HIGH (ref 0–5)
pH: 5 (ref 5.0–8.0)

## 2019-08-30 MED ORDER — CEPHALEXIN 500 MG PO CAPS
500.0000 mg | ORAL_CAPSULE | Freq: Once | ORAL | Status: AC
  Administered 2019-08-30: 500 mg via ORAL
  Filled 2019-08-30: qty 1

## 2019-08-30 MED ORDER — CEPHALEXIN 500 MG PO CAPS: 500.0000 mg | ORAL_CAPSULE | Freq: Two times a day (BID) | ORAL | 0 refills | Status: AC

## 2019-08-30 NOTE — ED Provider Notes (Signed)
[facility] Emergency Department Provider Note   ____________________________________________   First MD Initiated Contact with Patient 08/30/19 1539     (approximate)  I have reviewed the triage vital signs and the nursing notes.   HISTORY  Chief Complaint Foley cath pulled out  Chief complaint is pulled out Foley  HPI Pamela [name] is a 85 y.o. female patient reports Foley catheter came out.  She says it was uncomfortable from time was put in on telemetry just fell out by itself.  She denies any abdominal pain or dysuria at this point.         Past Medical History:  Diagnosis Date  . A-fib ([place]) 08/28/2014   Overview:  Overview:  Diastolic dysfunction on ECHO 04/2012-done at [facility]   . Abnormal gait 02/08/2012  . Abscess or cellulitis of leg 04/18/2012  . Absence of bladder continence 07/29/2012  . Acid reflux 02/08/2012  . Acute kidney failure ([place]) 05/15/2012  . Afib, paroxysmal, with RVR at times with BBB 05/13/2012  . Allergic drug rash 05/15/2012  . Anemia 05/13/2012  . 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 Benign essential HTN 02/08/2012  . Bladder infection, chronic 06/29/2013  . BP (high blood pressure) 05/12/2012  . Bursitis of knee 08/07/2014  . Cellulitis in diabetic foot ([place]) 05/12/2012  . CHF (congestive heart failure) ([place])   . Chronic kidney disease 06/01/2012   Overview:  Overview:   12/2012- Renal US, simple L cyst. UIEP normal   . Clinical depression 08/28/2014  . CN (constipation) 03/17/2012  . Dermatitis due to drug reaction 05/15/2012  . Diabetes mellitus   . Diastolic dysfunction, left ventricle 05/14/2012  . DM (diabetes mellitus) ([place]) 05/12/2012  . Dysrhythmia   . Dysuria   . Edema leg 07/02/2014  . Enthesopathy of knee 08/07/2014  . Excess fluid volume 05/20/2012  . FOM (frequency of micturition) 07/29/2012  . Frank hematuria 09/27/2014  . Gangrene ([place])   . GERD (gastroesophageal reflux disease)   . Heart disease   . High  cholesterol   . HTN (hypertension) 05/12/2012  . Hypercholesterolemia 05/12/2012  . Hypertension   . Incomplete bladder emptying 07/29/2012  . Left bundle branch block (LBBB)   . Open wound of knee, leg (except thigh), and ankle 06/01/2012  . Stevens-Johnson syndrome ([place]) 05/15/2012   Overview:  Overview:  Several years ago, allergic reaction to sulfa antibiotic Several years ago, allergic reaction to sulfa antibiotic     Patient Active Problem List   Diagnosis Date Noted  . NSTEMI (non-ST elevated myocardial infarction) ([place]) 11/28/2018  . UTI (urinary tract infection) 11/28/2017  . Pelvic muscle wasting 01/26/2017  . Cystocele, midline 01/26/2017  . Urinary frequency 12/26/2015  . Incontinence 12/07/2015  . Cystocele 12/07/2015  . Tinea cruris 12/07/2015  . Recurrent UTI 08/11/2015  . Vaginal atrophy 08/11/2015  . 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 Discoordination 05/08/2015  . Frank hematuria 09/27/2014  . Clinical depression 08/28/2014  . Major depressive disorder with single episode 08/28/2014  . Bursitis of knee 08/07/2014  . Enthesopathy of knee 08/07/2014  . Edema leg 07/02/2014  . Bladder infection, chronic 06/29/2013  . Incomplete bladder emptying 07/29/2012  . FOM (frequency of micturition) 07/29/2012  . Absence of bladder continence 07/29/2012  . Chronic kidney disease 06/01/2012  . Open wound of knee, leg (except thigh), and ankle 06/01/2012  . Skin rash 05/20/2012  . Acute kidney failure ([place]) 05/15/2012  . Allergic drug rash 05/15/2012  . Stevens-Johnson syndrome ([place])  05/15/2012  . Dermatitis due to drug taken internally 05/15/2012  . Dermatitis due to drug reaction 05/15/2012  . Acute renal failure (HCC) 05/15/2012  . Diastolic dysfunction, left ventricle 05/14/2012  . PAF (paroxysmal atrial fibrillation) (HCC) 05/13/2012  . Anemia 05/13/2012  . Cellulitis in diabetic foot (HCC) 05/12/2012  . HTN (hypertension) 05/12/2012  . Hypercholesterolemia 05/12/2012   . Abscess or cellulitis of leg 04/18/2012  . Diabetes mellitus (HCC) 04/18/2012  . CN (constipation) 03/17/2012  . Abnormal gait 02/08/2012  . Benign essential HTN 02/08/2012  . GERD (gastroesophageal reflux disease) 02/08/2012    Past Surgical History:  Procedure Laterality Date  . ABDOMINAL HYSTERECTOMY    . BLADDER SURGERY    . cataract surgery    . ENDOVENOUS ABLATION SAPHENOUS VEIN W/ LASER    . LEFT HEART CATH AND CORONARY ANGIOGRAPHY N/A 11/29/2018   Procedure: LEFT HEART CATH AND CORONARY ANGIOGRAPHY;  Surgeon: Lamar BlinksKowalski, Bruce J, MD;  Location: [facility] INVASIVE CV LAB;  Service: Cardiovascular;  Laterality: N/A;  . NASAL SINUS SURGERY    . TOE AMPUTATION    . TONSILLECTOMY      Prior to Admission medications   Medication Sig Start Date End Date Taking? Authorizing Provider  amiodarone (PACERONE) 200 MG tablet Take 200 mg by mouth daily.  12/16/15  Yes [provider]  amitriptyline (ELAVIL) 50 MG tablet Take 50 mg by mouth at bedtime.  03/31/16  Yes [provider]  amLODipine (NORVASC) 5 MG tablet Take 5 mg by mouth daily.   Yes [provider]  atorvastatin (LIPITOR) 40 MG tablet Take 40 mg by mouth daily.   Yes [provider]  clopidogrel (PLAVIX) 75 MG tablet Take 1 tablet (75 mg total) by mouth daily. 11/30/18  Yes Shaune Pollackhen, Qing, MD  insulin lispro protamine-lispro (HUMALOG 75/25 MIX) (75-25) 100 UNIT/ML SUSP injection Inject 20 Units into the skin daily.   Yes [provider]  insulin lispro protamine-lispro (HUMALOG 75/25 MIX) (75-25) 100 UNIT/ML SUSP injection Inject 18 Units into the skin at bedtime.   Yes [provider]  memantine (NAMENDA) 5 MG tablet Take 5 mg by mouth 2 (two) times daily. 12/15/18 12/15/19 Yes [provider]  metoprolol tartrate (LOPRESSOR) 25 MG tablet Take 25 mg by mouth 2 (two) times daily.   Yes [provider]  pantoprazole (PROTONIX) 40 MG tablet Take 40 mg by mouth daily.   Yes  [provider]  torsemide (DEMADEX) 20 MG tablet Take 20 mg by mouth daily. 08/17/19  Yes [provider]  vitamin B-12 (CYANOCOBALAMIN) 1000 MCG tablet Take 1,000 mcg by mouth daily.   Yes [provider]  bisacodyl (BISACODYL) 5 MG EC tablet Take 1 tablet (5 mg total) by mouth daily as needed for moderate constipation. 11/30/18   Shaune Pollackhen, Qing, MD  cephALEXin (KEFLEX) 500 MG capsule Take 1 capsule (500 mg total) by mouth 2 (two) times daily for 10 days. 08/30/19 09/09/19  Arnaldo NatalMalinda, Reily Treloar F, MD  hydrocortisone 1 % ointment Apply 1 application topically 2 (two) times daily. Please apply liberally to your vulva twice a day to help prevent further irritation 12/06/18   Merrily Brittleifenbark, Neil, MD  nitroGLYCERIN (NITROSTAT) 0.4 MG SL tablet Place 1 tablet (0.4 mg total) under the tongue every 5 (five) minutes as needed for chest pain. 11/30/18   Shaune Pollackhen, Qing, MD  NOVOLOG MIX 70/30 FLEXPEN (70-30) 100 UNIT/ML FlexPen Inject 0.12 mLs (12 Units total) into the skin 2 (two) times daily with a meal.  Patient not taking: Reported on 11/28/2018 11/30/17   Adrian Saran, MD    Allergies Ciprofloxacin, Sulfamethoxazole-trimethoprim, Augmentin [amoxicillin-pot clavulanate], Hydrochlorothiazide, Levofloxacin, Losartan potassium-hctz, Nitrofurantoin, Sulfa antibiotics, Sulfasalazine, and Levaquin [levofloxacin in d5w]  Family History  Problem Relation Age of Onset  . Diabetes Mellitus II Mother   . Heart disease Mother   . Lung cancer Father   . Diabetes Paternal Grandmother   . Diabetes Maternal Grandmother   . Kidney disease Neg Hx   . Prostate cancer Neg Hx   . Bladder Cancer Neg Hx     Social History Social History   Tobacco Use  . Smoking status: Never Smoker  . Smokeless tobacco: Never Used  Substance Use Topics  . Alcohol use: No    Alcohol/week: 0.0 standard drinks  . Drug use: No    Review of Systems  Constitutional: No fever/chills Eyes: No visual changes. ENT: No sore  throat. Cardiovascular: Denies chest pain. Respiratory: Denies shortness of breath. Gastrointestinal: No abdominal pain.  No nausea, no vomiting.  No diarrhea.  No constipation. Genitourinary: Negative for dysuria. Musculoskeletal: Negative for back pain. Skin: Negative for rash. Neurological: Negative for headaches  ____________________________________________   PHYSICAL EXAM:  VITAL SIGNS: ED Triage Vitals  Enc Vitals Group     BP      Pulse      Resp      Temp      Temp src      SpO2      Weight      Height      Head Circumference      Peak Flow      Pain Score      Pain Loc      Pain Edu?      Excl. in GC?     Constitutional: Alert and oriented. Well appearing and in no acute distress. Eyes: Conjunctivae are normal.  Head: Atraumatic. Nose: No congestion/rhinnorhea. Mouth/Throat: Mucous membranes are moist.  Oropharynx non-erythematous. Neck: No stridor. Cardiovascular: Normal rate, regular rhythm. Grossly normal heart sounds.  Good peripheral circulation. Respiratory: Normal respiratory effort.  No retractions. Lungs CTAB. Gastrointestinal: Soft and nontender. No distention. No abdominal bruits.. Musculoskeletal: No lower extremity tenderness   Neurologic:  Normal speech and language. No gross focal neurologic deficits are appreciated.  Skin:  Skin is warm, dry and intact. No rash noted.   ____________________________________________   LABS (all labs ordered are listed, but only abnormal results are displayed)  Labs Reviewed  URINALYSIS, COMPLETE (UACMP) WITH MICROSCOPIC - Abnormal; Notable for the following components:      Result Value   Color, Urine YELLOW (*)    APPearance CLOUDY (*)    Hgb urine dipstick SMALL (*)    Nitrite POSITIVE (*)    Leukocytes,Ua LARGE (*)    WBC, UA >50 (*)    Bacteria, UA MANY (*)    Non Squamous Epithelial PRESENT (*)    All other components within normal limits    ____________________________________________  EKG  ____________________________________________  RADIOLOGY  ED MD interpretation:   Official radiology report(s): No results found.  ____________________________________________   PROCEDURES  Procedure(s) performed (including Critical Care):  Procedures   ____________________________________________   INITIAL IMPRESSION / ASSESSMENT AND PLAN / ED COURSE  Patient gets a rash or hives with Augmentin.  She is allergic to almost everything else though.  I will try some Keflex twice a day and see if that will work.  We will give her the first dose here in  the emergency room. Pamela [name] was evaluated in Emergency Department on 08/30/2019 for the symptoms described in the history of present illness. She was evaluated in the context of the global COVID-19 pandemic, which necessitated consideration that the patient might be at risk for infection with the SARS-CoV-2 virus that causes COVID-19. Institutional protocols and algorithms that pertain to the evaluation of patients at risk for COVID-19 are in a state of rapid change based on information released by regulatory bodies including the CDC and federal and state organizations. These policies and algorithms were followed during the patient's care in the ED.              ____________________________________________   FINAL CLINICAL IMPRESSION(S) / ED DIAGNOSES  Final diagnoses:  Urinary tract infection with hematuria, site unspecified  Foley catheter problem, initial encounter [facility])     ED Discharge Orders         Ordered    cephALEXin (KEFLEX) 500 MG capsule  2 times daily     08/30/19 1721           Note:  This document was prepared using Dragon voice recognition software and may include unintentional dictation errors.    Arnaldo Natal, MD 08/30/19 [phone]

## 2019-08-30 NOTE — ED Notes (Signed)
Called [facility] and they will pick pt up.

## 2019-08-30 NOTE — ED Notes (Signed)
Staff at [facility] returned call and cannot pick up and they requested pt be sent EMS and they will pay.

## 2019-08-30 NOTE — ED Triage Notes (Signed)
Pt arrives from [place] via EMS. Pt accidentally pulled foley catheter out. Foley was placed yesterday per EMS.

## 2019-08-30 NOTE — ED Notes (Signed)
Pt given meal tray while waiting for transport.  

## 2019-08-30 NOTE — Discharge Instructions (Addendum)
Please continue use the Foley catheter.  You also have a small UTI.  I will give you some Keflex 1 pill 2 times a day for your UTI.  Please return for any further problems including fever vomiting feeling sicker developing any rash.

## 2019-10-06 ENCOUNTER — Encounter: Payer: Self-pay | Admitting: Emergency Medicine

## 2019-10-06 ENCOUNTER — Emergency Department
Admission: EM | Admit: 2019-10-06 | Discharge: 2019-10-06 | Disposition: A | Discharge status: Home / Self Care | Payer: Medicare Other | Attending: Emergency Medicine | Admitting: Emergency Medicine

## 2019-10-06 ENCOUNTER — Other Ambulatory Visit: Payer: Self-pay

## 2019-10-06 DIAGNOSIS — T839XXA Unspecified complication of genitourinary prosthetic device, implant and graft, initial encounter: Secondary | ICD-10-CM | POA: Insufficient documentation

## 2019-10-06 DIAGNOSIS — N390 Urinary tract infection, site not specified: Secondary | ICD-10-CM | POA: Insufficient documentation

## 2019-10-06 DIAGNOSIS — Z7902 Long term (current) use of antithrombotics/antiplatelets: Secondary | ICD-10-CM | POA: Insufficient documentation

## 2019-10-06 DIAGNOSIS — Y731 Therapeutic (nonsurgical) and rehabilitative gastroenterology and urology devices associated with adverse incidents: Secondary | ICD-10-CM | POA: Insufficient documentation

## 2019-10-06 DIAGNOSIS — Z794 Long term (current) use of insulin: Secondary | ICD-10-CM | POA: Diagnosis not present

## 2019-10-06 DIAGNOSIS — T83511A Infection and inflammatory reaction due to indwelling urethral catheter, initial encounter: Secondary | ICD-10-CM | POA: Diagnosis not present

## 2019-10-06 DIAGNOSIS — I509 Heart failure, unspecified: Secondary | ICD-10-CM | POA: Insufficient documentation

## 2019-10-06 DIAGNOSIS — Z79899 Other long term (current) drug therapy: Secondary | ICD-10-CM | POA: Diagnosis not present

## 2019-10-06 DIAGNOSIS — I110 Hypertensive heart disease with heart failure: Secondary | ICD-10-CM | POA: Diagnosis not present

## 2019-10-06 DIAGNOSIS — E119 Type 2 diabetes mellitus without complications: Secondary | ICD-10-CM | POA: Insufficient documentation

## 2019-10-06 DIAGNOSIS — R339 Retention of urine, unspecified: Secondary | ICD-10-CM | POA: Diagnosis present

## 2019-10-06 LAB — BASIC METABOLIC PANEL
Anion gap: 11 (ref 5–15)
BUN: 35 mg/dL — ABNORMAL HIGH (ref 8–23)
CO2: 27 mmol/L (ref 22–32)
Calcium: 9.3 mg/dL (ref 8.9–10.3)
Chloride: 103 mmol/L (ref 98–111)
Creatinine, Ser: 2.02 mg/dL — ABNORMAL HIGH (ref 0.44–1.00)
GFR calc Af Amer: 26 mL/min — ABNORMAL LOW (ref >60)
GFR calc non Af Amer: 22 mL/min — ABNORMAL LOW (ref >60)
Glucose, Bld: 154 mg/dL — ABNORMAL HIGH (ref 70–99)
Potassium: 4 mmol/L (ref 3.5–5.1)
Sodium: 141 mmol/L (ref 135–145)

## 2019-10-06 LAB — CBC WITH DIFFERENTIAL/PLATELET
Abs Immature Granulocytes: 0.06 10*3/uL (ref 0.00–0.07)
Basophils Absolute: 0.1 10*3/uL (ref 0.0–0.1)
Basophils Relative: 1 %
Eosinophils Absolute: 0.1 10*3/uL (ref 0.0–0.5)
Eosinophils Relative: 1 %
HCT: 39.1 % (ref 36.0–46.0)
Hemoglobin: 12.5 g/dL (ref 12.0–15.0)
Immature Granulocytes: 1 %
Lymphocytes Relative: 13 %
Lymphs Abs: 1.4 10*3/uL (ref 0.7–4.0)
MCH: 31.3 pg (ref 26.0–34.0)
MCHC: 32 g/dL (ref 30.0–36.0)
MCV: 97.8 fL (ref 80.0–100.0)
Monocytes Absolute: 1 10*3/uL (ref 0.1–1.0)
Monocytes Relative: 9 %
Neutro Abs: 8.1 10*3/uL — ABNORMAL HIGH (ref 1.7–7.7)
Neutrophils Relative %: 75 %
Platelets: 199 10*3/uL (ref 150–400)
RBC: 4 MIL/uL (ref 3.87–5.11)
RDW: 14.2 % (ref 11.5–15.5)
WBC: 10.7 10*3/uL — ABNORMAL HIGH (ref 4.0–10.5)
nRBC: 0 % (ref 0.0–0.2)

## 2019-10-06 LAB — URINALYSIS, COMPLETE (UACMP) WITH MICROSCOPIC
Bacteria, UA: NONE SEEN
Bilirubin Urine: NEGATIVE
Glucose, UA: NEGATIVE mg/dL
Ketones, ur: NEGATIVE mg/dL
Nitrite: POSITIVE — AB
Protein, ur: 30 mg/dL — AB
Specific Gravity, Urine: 1.016 (ref 1.005–1.030)
WBC, UA: >50 WBC/hpf — ABNORMAL HIGH (ref 0–5)
pH: 7 (ref 5.0–8.0)

## 2019-10-06 MED ORDER — CEPHALEXIN 500 MG PO CAPS
500.0000 mg | ORAL_CAPSULE | Freq: Once | ORAL | Status: AC
  Administered 2019-10-06: 500 mg via ORAL
  Filled 2019-10-06: qty 1

## 2019-10-06 MED ORDER — CEPHALEXIN 500 MG PO CAPS: 500.0000 mg | ORAL_CAPSULE | Freq: Two times a day (BID) | ORAL | 0 refills | Status: AC

## 2019-10-06 NOTE — ED Triage Notes (Signed)
Patient coming in from [place] for catheter issue. Patient states her catheter tubing got caught on something pulled. Patient states her catheter was put in by [facility] urology.

## 2019-10-06 NOTE — ED Provider Notes (Signed)
[facility] Emergency Department Provider Note   First MD Initiated Contact with Patient 10/06/19 0407     (approximate)  I have reviewed the triage vital signs and the nursing notes.   HISTORY  Chief Complaint Urinary Retention (catheter problem )   HPI Pamela [name] is a 85 y.o. female resents to the emergency department with history of indwelling Foley catheter getting caught while the patient was stumbling with resultant discomfort.  Patient admits to urine leaking around the catheter since event occurred earlier tonight.        Past Medical History:  Diagnosis Date  . A-fib (HCC) 08/28/2014   Overview:  Overview:  Diastolic dysfunction on ECHO 04/2012-done at [facility]   . Abnormal gait 02/08/2012  . Abscess or cellulitis of leg 04/18/2012  . Absence of bladder continence 07/29/2012  . Acid reflux 02/08/2012  . Acute kidney failure (HCC) 05/15/2012  . Afib, paroxysmal, with RVR at times with BBB 05/13/2012  . Allergic drug rash 05/15/2012  . Anemia 05/13/2012  . 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 Benign essential HTN 02/08/2012  . Bladder infection, chronic 06/29/2013  . BP (high blood pressure) 05/12/2012  . Bursitis of knee 08/07/2014  . Cellulitis in diabetic foot (HCC) 05/12/2012  . CHF (congestive heart failure) (HCC)   . Chronic kidney disease 06/01/2012   Overview:  Overview:   12/2012- Renal US, simple L cyst. UIEP normal   . Clinical depression 08/28/2014  . CN (constipation) 03/17/2012  . Dermatitis due to drug reaction 05/15/2012  . Diabetes mellitus   . Diastolic dysfunction, left ventricle 05/14/2012  . DM (diabetes mellitus) (HCC) 05/12/2012  . Dysrhythmia   . Dysuria   . Edema leg 07/02/2014  . Enthesopathy of knee 08/07/2014  . Excess fluid volume 05/20/2012  . FOM (frequency of micturition) 07/29/2012  . Frank hematuria 09/27/2014  . Gangrene (HCC)   . GERD (gastroesophageal reflux disease)   . Heart disease   . High cholesterol   . HTN  (hypertension) 05/12/2012  . Hypercholesterolemia 05/12/2012  . Hypertension   . Incomplete bladder emptying 07/29/2012  . Left bundle branch block (LBBB)   . Open wound of knee, leg (except thigh), and ankle 06/01/2012  . 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   Overview:  Overview:  Several years ago, allergic reaction to sulfa antibiotic Several years ago, allergic reaction to sulfa antibiotic     Patient Active Problem List   Diagnosis Date Noted  . NSTEMI (non-ST elevated myocardial infarction) (HCC) 11/28/2018  . UTI (urinary tract infection) 11/28/2017  . Pelvic muscle wasting 01/26/2017  . Cystocele, midline 01/26/2017  . Urinary frequency 12/26/2015  . Incontinence 12/07/2015  . Cystocele 12/07/2015  . Tinea cruris 12/07/2015  . Recurrent UTI 08/11/2015  . Vaginal atrophy 08/11/2015  . 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 Discoordination 05/08/2015  . Frank hematuria 09/27/2014  . Clinical depression 08/28/2014  . Major depressive disorder with single episode 08/28/2014  . Bursitis of knee 08/07/2014  . Enthesopathy of knee 08/07/2014  . Edema leg 07/02/2014  . Bladder infection, chronic 06/29/2013  . Incomplete bladder emptying 07/29/2012  . FOM (frequency of micturition) 07/29/2012  . Absence of bladder continence 07/29/2012  . Chronic kidney disease 06/01/2012  . Open wound of knee, leg (except thigh), and ankle 06/01/2012  . Skin rash 05/20/2012  . Acute kidney failure (HCC) 05/15/2012  . Allergic drug rash 05/15/2012  . 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  . Dermatitis due to drug taken internally  05/15/2012  . Dermatitis due to drug reaction 05/15/2012  . Acute renal failure (HCC) 05/15/2012  . Diastolic dysfunction, left ventricle 05/14/2012  . PAF (paroxysmal atrial fibrillation) (HCC) 05/13/2012  . Anemia 05/13/2012  . Cellulitis in diabetic foot (HCC) 05/12/2012  . HTN (hypertension) 05/12/2012  . Hypercholesterolemia 05/12/2012  . Abscess or  cellulitis of leg 04/18/2012  . Diabetes mellitus (HCC) 04/18/2012  . CN (constipation) 03/17/2012  . Abnormal gait 02/08/2012  . Benign essential HTN 02/08/2012  . GERD (gastroesophageal reflux disease) 02/08/2012    Past Surgical History:  Procedure Laterality Date  . ABDOMINAL HYSTERECTOMY    . BLADDER SURGERY    . cataract surgery    . ENDOVENOUS ABLATION SAPHENOUS VEIN W/ LASER    . LEFT HEART CATH AND CORONARY ANGIOGRAPHY N/A 11/29/2018   Procedure: LEFT HEART CATH AND CORONARY ANGIOGRAPHY;  Surgeon: Lamar BlinksKowalski, Bruce J, MD;  Location: [facility] INVASIVE CV LAB;  Service: Cardiovascular;  Laterality: N/A;  . NASAL SINUS SURGERY    . TOE AMPUTATION    . TONSILLECTOMY      Prior to Admission medications   Medication Sig Start Date End Date Taking? Authorizing Provider  amiodarone (PACERONE) 200 MG tablet Take 200 mg by mouth daily.  12/16/15   [provider]  amitriptyline (ELAVIL) 50 MG tablet Take 50 mg by mouth at bedtime.  03/31/16   [provider]  amLODipine (NORVASC) 5 MG tablet Take 5 mg by mouth daily.    [provider]  atorvastatin (LIPITOR) 40 MG tablet Take 40 mg by mouth daily.    [provider]  bisacodyl (BISACODYL) 5 MG EC tablet Take 1 tablet (5 mg total) by mouth daily as needed for moderate constipation. 11/30/18   Shaune Pollackhen, Qing, MD  clopidogrel (PLAVIX) 75 MG tablet Take 1 tablet (75 mg total) by mouth daily. 11/30/18   Shaune Pollackhen, Qing, MD  hydrocortisone 1 % ointment Apply 1 application topically 2 (two) times daily. Please apply liberally to your vulva twice a day to help prevent further irritation 12/06/18   Merrily Brittleifenbark, Neil, MD  insulin lispro protamine-lispro (HUMALOG 75/25 MIX) (75-25) 100 UNIT/ML SUSP injection Inject 20 Units into the skin daily.    [provider]  insulin lispro protamine-lispro (HUMALOG 75/25 MIX) (75-25) 100 UNIT/ML SUSP injection Inject 18 Units into the skin at bedtime.    [provider]   memantine (NAMENDA) 5 MG tablet Take 5 mg by mouth 2 (two) times daily. 12/15/18 12/15/19  [provider]  metoprolol tartrate (LOPRESSOR) 25 MG tablet Take 25 mg by mouth 2 (two) times daily.    [provider]  nitroGLYCERIN (NITROSTAT) 0.4 MG SL tablet Place 1 tablet (0.4 mg total) under the tongue every 5 (five) minutes as needed for chest pain. 11/30/18   Shaune Pollackhen, Qing, MD  NOVOLOG MIX 70/30 FLEXPEN (70-30) 100 UNIT/ML FlexPen Inject 0.12 mLs (12 Units total) into the skin 2 (two) times daily with a meal. Patient not taking: Reported on 11/28/2018 11/30/17   Adrian SaranMody, Sital, MD  pantoprazole (PROTONIX) 40 MG tablet Take 40 mg by mouth daily.    [provider]  torsemide (DEMADEX) 20 MG tablet Take 20 mg by mouth daily. 08/17/19   [provider]  vitamin B-12 (CYANOCOBALAMIN) 1000 MCG tablet Take 1,000 mcg by mouth daily.    [provider]    Allergies Ciprofloxacin, Sulfamethoxazole-trimethoprim, Augmentin [amoxicillin-pot clavulanate], Hydrochlorothiazide, Levofloxacin, Losartan potassium-hctz, Nitrofurantoin, Sulfa antibiotics, Sulfasalazine, and Levaquin [levofloxacin in d5w]  Family History  Problem Relation Age of Onset  . Diabetes Mellitus II Mother   . Heart disease Mother   . Lung cancer Father   . Diabetes Paternal Grandmother   . Diabetes Maternal Grandmother   . Kidney disease Neg Hx   . Prostate cancer Neg Hx   . Bladder Cancer Neg Hx     Social History Social History   Tobacco Use  . Smoking status: Never Smoker  . Smokeless tobacco: Never Used  Substance Use Topics  . Alcohol use: No    Alcohol/week: 0.0 standard drinks  . Drug use: No    Review of Systems Constitutional: No fever/chills Eyes: No visual changes. ENT: No sore throat. Cardiovascular: Denies chest pain. Respiratory: Denies shortness of breath. Gastrointestinal: No abdominal pain.  No nausea, no vomiting.  No diarrhea.  No constipation. Genitourinary:  Negative for dysuria.  Positive for urethral discomfort Musculoskeletal: Negative for neck pain.  Negative for back pain. Integumentary: Negative for rash. Neurological: Negative for headaches, focal weakness or numbness.   ____________________________________________   PHYSICAL EXAM:  VITAL SIGNS: ED Triage Vitals  Enc Vitals Group     BP 10/06/19 0212 (!) 144/82     Pulse Rate 10/06/19 0212 72     Resp 10/06/19 0212 18     Temp 10/06/19 0212 97.6 F (36.4 C)     Temp Source 10/06/19 0212 Oral     SpO2 10/06/19 0212 98 %     Weight 10/06/19 0217 85.3 kg (188 lb)     Height 10/06/19 0217 1.651 m (5\' 5" )     Head Circumference --      Peak Flow --      Pain Score 10/06/19 0216 4     Pain Loc --      Pain Edu? --      Excl. in GC? --     Constitutional: Alert and oriented.  Eyes: Conjunctivae are normal.  Mouth/Throat: Patient is wearing a mask. Neck: No stridor.  No meningeal signs.   Cardiovascular: Normal rate, regular rhythm. Good peripheral circulation. Grossly normal heart sounds. Respiratory: Normal respiratory effort.  No retractions. Gastrointestinal: Soft and nontender. No distention.  Genitourinary: Foley catheter balloon fully inflated noted at the vaginal introitus Musculoskeletal: No lower extremity tenderness nor edema. No gross deformities of extremities. Neurologic:  Normal speech and language. No gross focal neurologic deficits are appreciated.  Skin:  Skin is warm, dry and intact. Psychiatric: Mood and affect are normal. Speech and behavior are normal.  ____________________________________________   LABS (all labs ordered are listed, but only abnormal results are displayed)  Labs Reviewed  CBC WITH DIFFERENTIAL/PLATELET - Abnormal; Notable for the following components:      Result Value   WBC 10.7 (*)    Neutro Abs 8.1 (*)    All other components within normal limits  BASIC METABOLIC PANEL - Abnormal; Notable for the following components:    Glucose, Bld 154 (*)    BUN 35 (*)    Creatinine, Ser 2.02 (*)    GFR calc non Af Amer 22 (*)    GFR calc Af Amer 26 (*)    All other components within normal limits  URINALYSIS, COMPLETE (UACMP) WITH MICROSCOPIC   __   Procedures   ____________________________________________   INITIAL IMPRESSION / MDM / ASSESSMENT AND PLAN / ED COURSE  As part of my medical decision making, I reviewed the following data within the electronic MEDICAL RECORD NUMBER   85 year old female presenting with above-stated secondary  to dislodged Foley catheter which was replaced.  Urinalysis consistent with urinary tract infection.  I reviewed the patient's previous urine culture which revealed that it was sensitive to cephalosporin and as such she was given Keflex.      ____________________________________________  FINAL CLINICAL IMPRESSION(S) / ED DIAGNOSES  Final diagnoses:  Urinary tract infection associated with indwelling urethral catheter, initial encounter ([place])  Complication of Foley catheter, initial encounter ([place])     MEDICATIONS GIVEN DURING THIS VISIT:  Medications - No data to display   ED Discharge Orders    None      *Please note:  Pamela [name] was evaluated in Emergency Department on 10/06/2019 for the symptoms described in the history of present illness. She was evaluated in the context of the global COVID-19 pandemic, which necessitated consideration that the patient might be at risk for infection with the SARS-CoV-2 virus that causes COVID-19. Institutional protocols and algorithms that pertain to the evaluation of patients at risk for COVID-19 are in a state of rapid change based on information released by regulatory bodies including the CDC and federal and state organizations. These policies and algorithms were followed during the patient's care in the ED.  Some ED evaluations and interventions may be delayed as a result of limited staffing during the pandemic.*  Note:   This document was prepared using Dragon voice recognition software and may include unintentional dictation errors.   Gregor Hams, MD 10/06/19 [phone]

## 2019-10-06 NOTE — ED Notes (Signed)
This nurse did a bladder scan and it showed less than 51ml but patient states it just does not feel right

## 2019-10-08 LAB — URINE CULTURE: Culture: >=100000 — AB

## 2019-10-09 NOTE — Progress Notes (Signed)
Brief Pharmacy Note  Patient is an 85 y/o F with history of indwelling foley catheter who presented to [facility] ED 11/6 c/o catheter problem. Catheter was re-placed in ED. UA concerning for UTI and patient was discharged on cephalexin. Urine culture has resulted >100k colonies/mL Proteus mirabilis (cefazolin sensitive). Considering patient is appropriately covered on discharge antibiotic, will not pursue further intervention at this time.   Jerome Resident 09 October 2019

## 2019-11-20 ENCOUNTER — Emergency Department
Admission: EM | Admit: 2019-11-20 | Discharge: 2019-11-20 | Disposition: A | Discharge status: Skilled Nursing Facility | Payer: Medicare Other | Attending: Emergency Medicine | Admitting: Emergency Medicine

## 2019-11-20 ENCOUNTER — Other Ambulatory Visit: Payer: Self-pay

## 2019-11-20 DIAGNOSIS — E162 Hypoglycemia, unspecified: Secondary | ICD-10-CM | POA: Diagnosis present

## 2019-11-20 DIAGNOSIS — N189 Chronic kidney disease, unspecified: Secondary | ICD-10-CM | POA: Diagnosis not present

## 2019-11-20 DIAGNOSIS — N390 Urinary tract infection, site not specified: Secondary | ICD-10-CM | POA: Insufficient documentation

## 2019-11-20 DIAGNOSIS — Z79899 Other long term (current) drug therapy: Secondary | ICD-10-CM | POA: Diagnosis not present

## 2019-11-20 DIAGNOSIS — I129 Hypertensive chronic kidney disease with stage 1 through stage 4 chronic kidney disease, or unspecified chronic kidney disease: Secondary | ICD-10-CM | POA: Diagnosis not present

## 2019-11-20 DIAGNOSIS — E1122 Type 2 diabetes mellitus with diabetic chronic kidney disease: Secondary | ICD-10-CM | POA: Diagnosis not present

## 2019-11-20 DIAGNOSIS — Z794 Long term (current) use of insulin: Secondary | ICD-10-CM | POA: Insufficient documentation

## 2019-11-20 LAB — CBC WITH DIFFERENTIAL/PLATELET
Abs Immature Granulocytes: 0.04 10*3/uL (ref 0.00–0.07)
Basophils Absolute: 0 10*3/uL (ref 0.0–0.1)
Basophils Relative: 1 %
Eosinophils Absolute: 0.1 10*3/uL (ref 0.0–0.5)
Eosinophils Relative: 1 %
HCT: 38 % (ref 36.0–46.0)
Hemoglobin: 12.1 g/dL (ref 12.0–15.0)
Immature Granulocytes: 1 %
Lymphocytes Relative: 11 %
Lymphs Abs: 0.9 10*3/uL (ref 0.7–4.0)
MCH: 31.3 pg (ref 26.0–34.0)
MCHC: 31.8 g/dL (ref 30.0–36.0)
MCV: 98.4 fL (ref 80.0–100.0)
Monocytes Absolute: 0.6 10*3/uL (ref 0.1–1.0)
Monocytes Relative: 7 %
Neutro Abs: 6.6 10*3/uL (ref 1.7–7.7)
Neutrophils Relative %: 79 %
Platelets: 202 10*3/uL (ref 150–400)
RBC: 3.86 MIL/uL — ABNORMAL LOW (ref 3.87–5.11)
RDW: 15.3 % (ref 11.5–15.5)
WBC: 8.2 10*3/uL (ref 4.0–10.5)
nRBC: 0 % (ref 0.0–0.2)

## 2019-11-20 LAB — COMPREHENSIVE METABOLIC PANEL
ALT: 22 U/L (ref 0–44)
AST: 22 U/L (ref 15–41)
Albumin: 3.5 g/dL (ref 3.5–5.0)
Alkaline Phosphatase: 81 U/L (ref 38–126)
Anion gap: 11 (ref 5–15)
BUN: 36 mg/dL — ABNORMAL HIGH (ref 8–23)
CO2: 25 mmol/L (ref 22–32)
Calcium: 9.1 mg/dL (ref 8.9–10.3)
Chloride: 106 mmol/L (ref 98–111)
Creatinine, Ser: 2.15 mg/dL — ABNORMAL HIGH (ref 0.44–1.00)
GFR calc Af Amer: 24 mL/min — ABNORMAL LOW (ref >60)
GFR calc non Af Amer: 21 mL/min — ABNORMAL LOW (ref >60)
Glucose, Bld: 104 mg/dL — ABNORMAL HIGH (ref 70–99)
Potassium: 3.6 mmol/L (ref 3.5–5.1)
Sodium: 142 mmol/L (ref 135–145)
Total Bilirubin: 0.5 mg/dL (ref 0.3–1.2)
Total Protein: 6.7 g/dL (ref 6.5–8.1)

## 2019-11-20 LAB — URINALYSIS, ROUTINE W REFLEX MICROSCOPIC
Bilirubin Urine: NEGATIVE
Glucose, UA: NEGATIVE mg/dL
Hgb urine dipstick: NEGATIVE
Ketones, ur: NEGATIVE mg/dL
Nitrite: POSITIVE — AB
Protein, ur: 100 mg/dL — AB
Specific Gravity, Urine: 1.017 (ref 1.005–1.030)
WBC, UA: >50 WBC/hpf — ABNORMAL HIGH (ref 0–5)
pH: 7 (ref 5.0–8.0)

## 2019-11-20 LAB — GLUCOSE, CAPILLARY: Glucose-Capillary: 198 mg/dL — ABNORMAL HIGH (ref 70–99)

## 2019-11-20 MED ORDER — SODIUM CHLORIDE 0.9 % IV SOLN
1.0000 g | INTRAVENOUS | Status: AC
  Administered 2019-11-20: 1 g via INTRAVENOUS
  Filled 2019-11-20: qty 10

## 2019-11-20 MED ORDER — CEPHALEXIN 250 MG PO CAPS: 250.0000 mg | ORAL_CAPSULE | Freq: Two times a day (BID) | ORAL | 0 refills | Status: AC

## 2019-11-20 MED ORDER — SODIUM CHLORIDE 0.9 % IV BOLUS
500.0000 mL | Freq: Once | INTRAVENOUS | Status: AC
  Administered 2019-11-20: 500 mL via INTRAVENOUS

## 2019-11-20 NOTE — Discharge Instructions (Signed)
As we discussed, your blood glucose level was normal for [place] even after we checked it multiple times during the night.  But you do have a significant urinary tract infection and you were given a first dose of antibiotics in the emergency department and a prescription for antibiotics to take as an outpatient.  Please make sure to have the prescription filled in the morning and take it as directed until the medicine is gone.  Follow-up with your doctor as needed.  Return to the emergency department if you develop new or worsening symptoms that concern you.

## 2019-11-20 NOTE — ED Notes (Addendum)
Report given to Welford Roche at [facility].   Reviewed discharge instructions, follow-up care, and prescriptions with patient. Patient verbalized understanding of all information reviewed. Patient stable, with no distress noted at this time.

## 2019-11-20 NOTE — ED Notes (Addendum)
EDP Karma Greaser notified pt continues to c/o "stabbing pain" in her R elbow. Pt currently NSR on cardiac monitor. No new orders.

## 2019-11-20 NOTE — ED Notes (Signed)
Patient to waiting room via wheelchair by EMS.  Per EMS patient with complaint of hypoglycemia.  Per EMS facility stated cbg was 48 and they had her eating and it was then 88.  On EMS arrival patient cbg 208.   EMS vital signs - hr 62, bp 139 82, pulse oxi 97% on room air.

## 2019-11-20 NOTE — ED Notes (Addendum)
Pt given another warm blanket. Pt noted to have L hearing aide in but not R.

## 2019-11-20 NOTE — ED Provider Notes (Signed)
[facility] Emergency Department Provider Note  ____________________________________________   First MD Initiated Contact with Patient 11/20/19 0147     (approximate)  I have reviewed the triage vital signs and the nursing notes.   HISTORY  Chief Complaint Hypoglycemia    HPI Pamela [name] is a 85 y.o. female with medical history as listed below who lives at [facility] and presents by EMS for evaluation of generalized weakness which led to a check of her blood sugar  which demonstrated a blood glucose in the 40s.  They apparently gave her something to bring the blood glucose level up and then called EMS.  She is awake and alert and in no distress upon arrival.  She says that she feels fine although she remembers feeling a little bit weak earlier.  She denies any known contact with COVID-19 patients.  She denies fever/chills, sore throat, loss of smell and taste, chest pain, shortness of breath, nausea, vomiting, abdominal pain, and dysuria.  Symptoms were acute in onset and reportedly severe although they have resolved.  What ever oral glucose replacement she took seems to have made the situation better and nothing in particular makes it worse.  Of note, she states that she is particularly prone to urinary tract infections.        Past Medical History:  Diagnosis Date  . A-fib (HCC) 08/28/2014   Overview:  Overview:  Diastolic dysfunction on ECHO 04/2012-done at [facility]   . Abnormal gait 02/08/2012  . Abscess or cellulitis of leg 04/18/2012  . Absence of bladder continence 07/29/2012  . Acid reflux 02/08/2012  . Acute kidney failure (HCC) 05/15/2012  . Afib, paroxysmal, with RVR at times with BBB 05/13/2012  . Allergic drug rash 05/15/2012  . Anemia 05/13/2012  . 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 Benign essential HTN 02/08/2012  . Bladder infection, chronic 06/29/2013  . BP (high blood pressure) 05/12/2012  . Bursitis of knee 08/07/2014  .  Cellulitis in diabetic foot (HCC) 05/12/2012  . CHF (congestive heart failure) (HCC)   . Chronic kidney disease 06/01/2012   Overview:  Overview:   12/2012- Renal US, simple L cyst. UIEP normal   . Clinical depression 08/28/2014  . CN (constipation) 03/17/2012  . Dermatitis due to drug reaction 05/15/2012  . Diabetes mellitus   . Diastolic dysfunction, left ventricle 05/14/2012  . DM (diabetes mellitus) (HCC) 05/12/2012  . Dysrhythmia   . Dysuria   . Edema leg 07/02/2014  . Enthesopathy of knee 08/07/2014  . Excess fluid volume 05/20/2012  . FOM (frequency of micturition) 07/29/2012  . Frank hematuria 09/27/2014  . Gangrene (HCC)   . GERD (gastroesophageal reflux disease)   . Heart disease   . High cholesterol   . HTN (hypertension) 05/12/2012  . Hypercholesterolemia 05/12/2012  . Hypertension   . Incomplete bladder emptying 07/29/2012  . Left bundle branch block (LBBB)   . Open wound of knee, leg (except thigh), and ankle 06/01/2012  . 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   Overview:  Overview:  Several years ago, allergic reaction to sulfa antibiotic Several years ago, allergic reaction to sulfa antibiotic     Patient Active Problem List   Diagnosis Date Noted  . NSTEMI (non-ST elevated myocardial infarction) (HCC) 11/28/2018  . UTI (urinary tract infection) 11/28/2017  . Pelvic muscle wasting 01/26/2017  . Cystocele, midline 01/26/2017  . Urinary frequency 12/26/2015  . Incontinence 12/07/2015  . Cystocele 12/07/2015  . Tinea cruris 12/07/2015  . Recurrent  UTI 08/11/2015  . Vaginal atrophy 08/11/2015  . 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 Discoordination 05/08/2015  . Frank hematuria 09/27/2014  . Clinical depression 08/28/2014  . Major depressive disorder with single episode 08/28/2014  . Bursitis of knee 08/07/2014  . Enthesopathy of knee 08/07/2014  . Edema leg 07/02/2014  . Bladder infection, chronic 06/29/2013  . Incomplete bladder emptying 07/29/2012  . FOM (frequency of  micturition) 07/29/2012  . Absence of bladder continence 07/29/2012  . Chronic kidney disease 06/01/2012  . Open wound of knee, leg (except thigh), and ankle 06/01/2012  . Skin rash 05/20/2012  . Acute kidney failure (HCC) 05/15/2012  . Allergic drug rash 05/15/2012  . 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  . Dermatitis due to drug taken internally 05/15/2012  . Dermatitis due to drug reaction 05/15/2012  . Acute renal failure (HCC) 05/15/2012  . Diastolic dysfunction, left ventricle 05/14/2012  . PAF (paroxysmal atrial fibrillation) (HCC) 05/13/2012  . Anemia 05/13/2012  . Cellulitis in diabetic foot (HCC) 05/12/2012  . HTN (hypertension) 05/12/2012  . Hypercholesterolemia 05/12/2012  . Abscess or cellulitis of leg 04/18/2012  . Diabetes mellitus (HCC) 04/18/2012  . CN (constipation) 03/17/2012  . Abnormal gait 02/08/2012  . Benign essential HTN 02/08/2012  . GERD (gastroesophageal reflux disease) 02/08/2012    Past Surgical History:  Procedure Laterality Date  . ABDOMINAL HYSTERECTOMY    . BLADDER SURGERY    . cataract surgery    . ENDOVENOUS ABLATION SAPHENOUS VEIN W/ LASER    . LEFT HEART CATH AND CORONARY ANGIOGRAPHY N/A 11/29/2018   Procedure: LEFT HEART CATH AND CORONARY ANGIOGRAPHY;  Surgeon: Lamar Blinks, MD;  Location: [facility] INVASIVE CV LAB;  Service: Cardiovascular;  Laterality: N/A;  . NASAL SINUS SURGERY    . TOE AMPUTATION    . TONSILLECTOMY      Prior to Admission medications   Medication Sig Start Date End Date Taking? Authorizing Provider  amiodarone (PACERONE) 200 MG tablet Take 200 mg by mouth daily.  12/16/15   [provider]  amitriptyline (ELAVIL) 50 MG tablet Take 50 mg by mouth at bedtime.  03/31/16   [provider]  amLODipine (NORVASC) 5 MG tablet Take 5 mg by mouth daily.    [provider]  atorvastatin (LIPITOR) 40 MG tablet Take 40 mg by mouth daily.    [provider]  bisacodyl (BISACODYL) 5 MG EC  tablet Take 1 tablet (5 mg total) by mouth daily as needed for moderate constipation. 11/30/18   Shaune Pollack, MD  cephALEXin (KEFLEX) 250 MG capsule Take 1 capsule (250 mg total) by mouth 2 (two) times daily for 7 days. 11/20/19 11/27/19  Loleta Rose, MD  clopidogrel (PLAVIX) 75 MG tablet Take 1 tablet (75 mg total) by mouth daily. 11/30/18   Shaune Pollack, MD  hydrocortisone 1 % ointment Apply 1 application topically 2 (two) times daily. Please apply liberally to your vulva twice a day to help prevent further irritation 12/06/18   Merrily Brittle, MD  insulin lispro protamine-lispro (HUMALOG 75/25 MIX) (75-25) 100 UNIT/ML SUSP injection Inject 20 Units into the skin daily.    [provider]  insulin lispro protamine-lispro (HUMALOG 75/25 MIX) (75-25) 100 UNIT/ML SUSP injection Inject 18 Units into the skin at bedtime.    [provider]  memantine (NAMENDA) 5 MG tablet Take 5 mg by mouth 2 (two) times daily. 12/15/18 12/15/19  [provider]  metoprolol tartrate (LOPRESSOR) 25 MG tablet Take 25 mg by mouth 2 (two) times  daily.    [provider]  nitroGLYCERIN (NITROSTAT) 0.4 MG SL tablet Place 1 tablet (0.4 mg total) under the tongue every 5 (five) minutes as needed for chest pain. 11/30/18   Shaune Pollack, MD  NOVOLOG MIX 70/30 FLEXPEN (70-30) 100 UNIT/ML FlexPen Inject 0.12 mLs (12 Units total) into the skin 2 (two) times daily with a meal. Patient not taking: Reported on 11/28/2018 11/30/17   Adrian Saran, MD  pantoprazole (PROTONIX) 40 MG tablet Take 40 mg by mouth daily.    [provider]  torsemide (DEMADEX) 20 MG tablet Take 20 mg by mouth daily. 08/17/19   [provider]  vitamin B-12 (CYANOCOBALAMIN) 1000 MCG tablet Take 1,000 mcg by mouth daily.    [provider]    Allergies Ciprofloxacin, Sulfamethoxazole-trimethoprim, Augmentin [amoxicillin-pot clavulanate], Hydrochlorothiazide, Levofloxacin, Losartan potassium-hctz, Nitrofurantoin,  Sulfa antibiotics, Sulfasalazine, and Levaquin [levofloxacin in d5w]  Family History  Problem Relation Age of Onset  . Diabetes Mellitus II Mother   . Heart disease Mother   . Lung cancer Father   . Diabetes Paternal Grandmother   . Diabetes Maternal Grandmother   . Kidney disease Neg Hx   . Prostate cancer Neg Hx   . Bladder Cancer Neg Hx     Social History Social History   Tobacco Use  . Smoking status: Never Smoker  . Smokeless tobacco: Never Used  Substance Use Topics  . Alcohol use: No    Alcohol/week: 0.0 standard drinks  . Drug use: No    Review of Systems Constitutional: No fever/chills.  Acute onset of generalized weakness and reported hypoglycemia, now resolved. Eyes: No visual changes. ENT: No sore throat. Cardiovascular: Denies chest pain. Respiratory: Denies shortness of breath. Gastrointestinal: No abdominal pain.  No nausea, no vomiting.  No diarrhea.  No constipation. Genitourinary: Negative for dysuria. Musculoskeletal: Negative for neck pain.  Negative for back pain. Integumentary: Negative for rash. Neurological: Negative for headaches, focal weakness or numbness.   ____________________________________________   PHYSICAL EXAM:  VITAL SIGNS: ED Triage Vitals  Enc Vitals Group     BP 11/20/19 0055 140/66     Pulse Rate 11/20/19 0055 (!) 57     Resp 11/20/19 0055 20     Temp 11/20/19 0055 97.6 F (36.4 C)     Temp Source 11/20/19 0055 Oral     SpO2 11/20/19 0055 97 %     Weight 11/20/19 0048 81.6 kg (180 lb)     Height 11/20/19 0048 1.6 m ( )     Head Circumference --      Peak Flow --      Pain Score 11/20/19 0048 0     Pain Loc --      Pain Edu? --      Excl. in GC? --     Constitutional: Alert and oriented.  Generally well-appearing and in no distress.  Cheerful and communicative. Eyes: Conjunctivae are normal.  Head: Atraumatic. Nose: No congestion/rhinnorhea. Mouth/Throat: Patient is wearing a mask. Neck: No stridor.  No  meningeal signs.   Cardiovascular: Normal rate, regular rhythm. Good peripheral circulation. Grossly normal heart sounds. Respiratory: Normal respiratory effort.  No retractions. Gastrointestinal: Soft and nontender. No distention.  Musculoskeletal: No lower extremity tenderness nor edema. No gross deformities of extremities.  The patient reported acute onset aching pain just above her right elbow just as I was evaluating her.  She has no tenderness to palpation and no pain with flexion extension of the elbow.  Of note  this is the area where the blood pressure cuff is located. Neurologic: Patient is hard of hearing at baseline and has in only her left hearing aid.  Normal speech and language. No gross focal neurologic deficits are appreciated other than the hearing loss. Skin:  Skin is warm, dry and intact. Psychiatric: Mood and affect are normal. Speech and behavior are normal.  ____________________________________________   LABS (all labs ordered are listed, but only abnormal results are displayed)  Labs Reviewed  CBC WITH DIFFERENTIAL/PLATELET - Abnormal; Notable for the following components:      Result Value   RBC 3.86 (*)    All other components within normal limits  COMPREHENSIVE METABOLIC PANEL - Abnormal; Notable for the following components:   Glucose, Bld 104 (*)    BUN 36 (*)    Creatinine, Ser 2.15 (*)    GFR calc non Af Amer 21 (*)    GFR calc Af Amer 24 (*)    All other components within normal limits  URINALYSIS, ROUTINE W REFLEX MICROSCOPIC - Abnormal; Notable for the following components:   Color, Urine YELLOW (*)    APPearance TURBID (*)    Protein, ur 100 (*)    Nitrite POSITIVE (*)    Leukocytes,Ua LARGE (*)    WBC, UA >50 (*)    Bacteria, UA FEW (*)    All other components within normal limits  GLUCOSE, CAPILLARY - Abnormal; Notable for the following components:   Glucose-Capillary 198 (*)    All other components within normal limits  URINE CULTURE  CBG  MONITORING, ED  CBG MONITORING, ED   ____________________________________________  EKG  No indication for emergent EKG ____________________________________________  RADIOLOGY I, Loleta Roseory Angelito Hopping, personally viewed and evaluated these images (plain radiographs) as part of my medical decision making, as well as reviewing the written report by the radiologist.  ED MD interpretation: No indication for emergent imaging  Official radiology report(s): No results found.  ____________________________________________   PROCEDURES   Procedure(s) performed (including Critical Care):  Procedures   ____________________________________________   INITIAL IMPRESSION / MDM / ASSESSMENT AND PLAN / ED COURSE  As part of my medical decision making, I reviewed the following data within the electronic MEDICAL RECORD NUMBER Nursing notes reviewed and incorporated, Labs reviewed , Old chart reviewed and Notes from prior ED visits   Differential diagnosis includes, but is not limited to, overmedication resulting in hypoglycemia, acute infection such as urinalysis or pneumonia, other nonspecific metabolic or electrolyte abnormality, less likely ACS or CVA.  The patient is currently well-appearing and in no distress and asymptomatic.  No focal neurological deficits other than her chronic hearing loss.  Vital signs are stable other than some hypertension.  She has chronic kidney disease and although her creatinine is elevated at just above 2 it has been this high and higher in the past and she has no clinical signs of dehydration.  She has a strongly positive urinalysis and I have sent a urine culture and I have ordered ceftriaxone 1 g IV.  She has no leukocytosis and no signs of systemic infection.  I suspect that her urinary tract infection has led to some glycemic control difficulties.  However her glucose was 104 when checked on the comprehensive metabolic panel.  She is willing to eat a meal and I have given  her a [place] sandwich tray.  We will provide a small fluid bolus for her slightly elevated BUN and creatinine along with the ceftriaxone but I anticipate discharge back  to her facility.  No other complaints at this time and the right elbow pain was minor with no physical exam abnormalities or reproducible tenderness and I do not believe that this represents an emergent medical condition.      Clinical Course as of Nov 19 436  Mon Nov 20, 2019  0430 The patient has been resting comfortably and in no distress.  We have observed her for 4 hours in the emergency department.  No more arm pain.  Repeat fingerstick blood glucose is appropriate.  She is safe for discharge back to her facility.  I gave my usual and customary return precautions.   [CF]  [id number] Of note, her creatinine clearance is 26 mils per minute, so I am giving her a reduced dose of Keflex.   [CF]    Clinical Course User Index [CF] Hinda Kehr, MD     ____________________________________________  FINAL CLINICAL IMPRESSION(S) / ED DIAGNOSES  Final diagnoses:  Urinary tract infection without hematuria, site unspecified  Chronic kidney disease, unspecified CKD stage     MEDICATIONS GIVEN DURING THIS VISIT:  Medications  cefTRIAXone (ROCEPHIN) 1 g in sodium chloride 0.9 % 100 mL IVPB (0 g Intravenous Stopped 11/20/19 0257)  sodium chloride 0.9 % bolus 500 mL (500 mLs Intravenous New Bag/Given 11/20/19 0257)     ED Discharge Orders         Ordered    cephALEXin (KEFLEX) 250 MG capsule  2 times daily     11/20/19 1610          *Please note:  Pamela [name] was evaluated in Emergency Department on 11/20/2019 for the symptoms described in the history of present illness. She was evaluated in the context of the global COVID-19 pandemic, which necessitated consideration that the patient might be at risk for infection with the SARS-CoV-2 virus that causes COVID-19. Institutional protocols and algorithms that pertain to the  evaluation of patients at risk for COVID-19 are in a state of rapid change based on information released by regulatory bodies including the CDC and federal and state organizations. These policies and algorithms were followed during the patient's care in the ED.  Some ED evaluations and interventions may be delayed as a result of limited staffing during the pandemic.*  Note:  This document was prepared using Dragon voice recognition software and may include unintentional dictation errors.   Hinda Kehr, MD 11/20/19 [phone]

## 2019-11-20 NOTE — ED Notes (Signed)
Pt reports sudden R elbow/upper arm pain. EDP Karma Greaser at bedside. Order from EDP to give pt food tray and drink.

## 2019-11-20 NOTE — ED Notes (Signed)
Pt currently sitting up in bed eating

## 2019-11-20 NOTE — ED Notes (Signed)
Upon entrance to room pt asleep. Easily woken. Updated will need an IV for antibiotics. Pt agreeable.

## 2019-11-20 NOTE — ED Notes (Addendum)
Pt alert and cracking jokes. Pt denies HA, nausea, dizziness, weakness outside of baseline. Repositioned in bed. Reports that she left her hearing aides at the nursing home. Foley cath present. Urine in line noted to be cloudy.

## 2019-11-20 NOTE — ED Triage Notes (Signed)
Patient reports she was feel weak and staff checked her sugar and it was low.  On arrival to ED patient is awake, alert and oriented (patient is hard of hearing).

## 2019-11-22 LAB — URINE CULTURE
Culture: >=100000 — AB
Special Requests: NORMAL

## 2020-02-01 ENCOUNTER — Emergency Department: Payer: Medicare Other

## 2020-02-01 ENCOUNTER — Other Ambulatory Visit: Payer: Self-pay

## 2020-02-01 ENCOUNTER — Emergency Department
Admission: EM | Admit: 2020-02-01 | Discharge: 2020-02-01 | Disposition: A | Discharge status: Skilled Nursing Facility | Payer: Medicare Other | Attending: Emergency Medicine | Admitting: Emergency Medicine

## 2020-02-01 DIAGNOSIS — Y9389 Activity, other specified: Secondary | ICD-10-CM | POA: Insufficient documentation

## 2020-02-01 DIAGNOSIS — M79604 Pain in right leg: Secondary | ICD-10-CM | POA: Diagnosis not present

## 2020-02-01 DIAGNOSIS — Y999 Unspecified external cause status: Secondary | ICD-10-CM | POA: Diagnosis not present

## 2020-02-01 DIAGNOSIS — W19XXXA Unspecified fall, initial encounter: Secondary | ICD-10-CM | POA: Diagnosis not present

## 2020-02-01 DIAGNOSIS — M25561 Pain in right knee: Secondary | ICD-10-CM | POA: Insufficient documentation

## 2020-02-01 DIAGNOSIS — Y92129 Unspecified place in nursing home as the place of occurrence of the external cause: Secondary | ICD-10-CM | POA: Insufficient documentation

## 2020-02-01 DIAGNOSIS — M1711 Unilateral primary osteoarthritis, right knee: Secondary | ICD-10-CM

## 2020-02-01 NOTE — ED Notes (Signed)
EMS transport arrives for patient, states they were informed son would pick up patient. Nurse calls son and states he will come and pick her up and take her to nursing. Will be here in 20 minutes

## 2020-02-01 NOTE — Discharge Instructions (Addendum)
You have been seen in the emergency department after a fall and for right knee pain.  Your work-up shows significant arthritis of the right knee and a small effusion which could indicate an acute injury.  No bones are broken.  Please use your walker when ambulating.  Please use Tylenol for pain control.  Please call the number provided for orthopedics to arrange a follow-up appointment.  Return to the emergency department for any acute concern.

## 2020-02-01 NOTE — ED Provider Notes (Signed)
[facility] Emergency Department Provider Note  Time seen: 7:01 PM  I have reviewed the triage vital signs and the nursing notes.   HISTORY  Chief Complaint Right knee pain   HPI Pamela [name] is a 86 y.o. female with a past medical history paroxysmal A. fib, CHF, CKD, diabetes, hypertension, hyperlipidemia presents to the emergency department for right knee pain.  According to the patient she had a fall yesterday, that time did not want to be transported to the emergency department.  Patient states she had mild right knee pain ongoing chronically but after the fall states the pain was more significant today.  Patient has been able to ambulate but does state increased pain in her right knee when ambulating.  Patient denies hitting her head.  Denies LOC.  Denies any other injuries.    Past Medical History:  Diagnosis Date  . A-fib (HCC) 08/28/2014   Overview:  Overview:  Diastolic dysfunction on ECHO 04/2012-done at [facility]   . Abnormal gait 02/08/2012  . Abscess or cellulitis of leg 04/18/2012  . Absence of bladder continence 07/29/2012  . Acid reflux 02/08/2012  . Acute kidney failure (HCC) 05/15/2012  . Afib, paroxysmal, with RVR at times with BBB 05/13/2012  . Allergic drug rash 05/15/2012  . Anemia 05/13/2012  . 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 Benign essential HTN 02/08/2012  . Bladder infection, chronic 06/29/2013  . BP (high blood pressure) 05/12/2012  . Bursitis of knee 08/07/2014  . Cellulitis in diabetic foot (HCC) 05/12/2012  . CHF (congestive heart failure) (HCC)   . Chronic kidney disease 06/01/2012   Overview:  Overview:   12/2012- Renal US, simple L cyst. UIEP normal   . Clinical depression 08/28/2014  . CN (constipation) 03/17/2012  . Dermatitis due to drug reaction 05/15/2012  . Diabetes mellitus   . Diastolic dysfunction, left ventricle 05/14/2012  . DM (diabetes mellitus) (HCC) 05/12/2012  . Dysrhythmia   . Dysuria   . Edema leg 07/02/2014  .  Enthesopathy of knee 08/07/2014  . Excess fluid volume 05/20/2012  . FOM (frequency of micturition) 07/29/2012  . Frank hematuria 09/27/2014  . Gangrene (HCC)   . GERD (gastroesophageal reflux disease)   . Heart disease   . High cholesterol   . HTN (hypertension) 05/12/2012  . Hypercholesterolemia 05/12/2012  . Hypertension   . Incomplete bladder emptying 07/29/2012  . Left bundle branch block (LBBB)   . Open wound of knee, leg (except thigh), and ankle 06/01/2012  . 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   Overview:  Overview:  Several years ago, allergic reaction to sulfa antibiotic Several years ago, allergic reaction to sulfa antibiotic     Patient Active Problem List   Diagnosis Date Noted  . NSTEMI (non-ST elevated myocardial infarction) (HCC) 11/28/2018  . UTI (urinary tract infection) 11/28/2017  . Pelvic muscle wasting 01/26/2017  . Cystocele, midline 01/26/2017  . Urinary frequency 12/26/2015  . Incontinence 12/07/2015  . Cystocele 12/07/2015  . Tinea cruris 12/07/2015  . Recurrent UTI 08/11/2015  . Vaginal atrophy 08/11/2015  . 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 Discoordination 05/08/2015  . Frank hematuria 09/27/2014  . Clinical depression 08/28/2014  . Major depressive disorder with single episode 08/28/2014  . Bursitis of knee 08/07/2014  . Enthesopathy of knee 08/07/2014  . Edema leg 07/02/2014  . Bladder infection, chronic 06/29/2013  . Incomplete bladder emptying 07/29/2012  . FOM (frequency of micturition) 07/29/2012  . Absence of bladder continence 07/29/2012  . Chronic kidney  disease 06/01/2012  . Open wound of knee, leg (except thigh), and ankle 06/01/2012  . Skin rash 05/20/2012  . Acute kidney failure ([place]) 05/15/2012  . Allergic drug rash 05/15/2012  . Stevens-Johnson syndrome ([place]) 05/15/2012  . Dermatitis due to drug taken internally 05/15/2012  . Dermatitis due to drug reaction 05/15/2012  . Acute renal failure ([place]) 05/15/2012  . Diastolic  dysfunction, left ventricle 05/14/2012  . PAF (paroxysmal atrial fibrillation) ([place]) 05/13/2012  . Anemia 05/13/2012  . Cellulitis in diabetic foot ([place]) 05/12/2012  . HTN (hypertension) 05/12/2012  . Hypercholesterolemia 05/12/2012  . Abscess or cellulitis of leg 04/18/2012  . Diabetes mellitus ([place]) 04/18/2012  . CN (constipation) 03/17/2012  . Abnormal gait 02/08/2012  . Benign essential HTN 02/08/2012  . GERD (gastroesophageal reflux disease) 02/08/2012    Past Surgical History:  Procedure Laterality Date  . ABDOMINAL HYSTERECTOMY    . BLADDER SURGERY    . cataract surgery    . ENDOVENOUS ABLATION SAPHENOUS VEIN W/ LASER    . LEFT HEART CATH AND CORONARY ANGIOGRAPHY N/A 11/29/2018   Procedure: LEFT HEART CATH AND CORONARY ANGIOGRAPHY;  Surgeon: Corey Skains, MD;  Location: [place] [facility];  Service: Cardiovascular;  Laterality: N/A;  . NASAL SINUS SURGERY    . TOE AMPUTATION    . TONSILLECTOMY      Prior to Admission medications   Medication Sig Start Date End Date Taking? Authorizing Provider  amiodarone (PACERONE) 200 MG tablet Take 200 mg by mouth daily.  12/16/15   [provider]  amitriptyline (ELAVIL) 50 MG tablet Take 50 mg by mouth at bedtime.  03/31/16   [provider]  amLODipine (NORVASC) 5 MG tablet Take 5 mg by mouth daily.    [provider]  atorvastatin (LIPITOR) 40 MG tablet Take 40 mg by mouth daily.    [provider]  bisacodyl (BISACODYL) 5 MG EC tablet Take 1 tablet (5 mg total) by mouth daily as needed for moderate constipation. 11/30/18   Demetrios Loll, MD  clopidogrel (PLAVIX) 75 MG tablet Take 1 tablet (75 mg total) by mouth daily. 11/30/18   Demetrios Loll, MD  hydrocortisone 1 % ointment Apply 1 application topically 2 (two) times daily. Please apply liberally to your vulva twice a day to help prevent further irritation 12/06/18   Darel Hong, MD  insulin lispro protamine-lispro (HUMALOG 75/25 MIX) (75-25) 100  UNIT/ML SUSP injection Inject 20 Units into the skin daily.    [provider]  insulin lispro protamine-lispro (HUMALOG 75/25 MIX) (75-25) 100 UNIT/ML SUSP injection Inject 18 Units into the skin at bedtime.    [provider]  memantine (NAMENDA) 5 MG tablet Take 5 mg by mouth 2 (two) times daily. 12/15/18 12/15/19  [provider]  metoprolol tartrate (LOPRESSOR) 25 MG tablet Take 25 mg by mouth 2 (two) times daily.    [provider]  nitroGLYCERIN (NITROSTAT) 0.4 MG SL tablet Place 1 tablet (0.4 mg total) under the tongue every 5 (five) minutes as needed for chest pain. 11/30/18   Demetrios Loll, MD  NOVOLOG MIX 70/30 FLEXPEN (70-30) 100 UNIT/ML FlexPen Inject 0.12 mLs (12 Units total) into the skin 2 (two) times daily with a meal. Patient not taking: Reported on 11/28/2018 11/30/17   Bettey Costa, MD  pantoprazole (PROTONIX) 40 MG tablet Take 40 mg by mouth daily.    [provider]  torsemide (DEMADEX) 20 MG tablet Take 20 mg by mouth daily. 08/17/19   [provider]  vitamin B-12 (CYANOCOBALAMIN) 1000 MCG tablet Take 1,000 mcg by mouth daily.    [provider]    Allergies  Allergen Reactions  . Ciprofloxacin Anaphylaxis  . Sulfamethoxazole-Trimethoprim Other (See Comments) and Rash    Went into [employer].  . Augmentin [Amoxicillin-Pot Clavulanate] Hives  . Hydrochlorothiazide   . Levofloxacin Other (See Comments)  . Losartan Potassium-Hctz Other (See Comments)    Other reaction(s): Unknown  . Nitrofurantoin Other (See Comments)  . Sulfa Antibiotics Other (See Comments)    Levonne Spiller [facility]ons  . Sulfasalazine     unknown  . Levaquin [Levofloxacin In D5w]     Family History  Problem Relation Age of Onset  . Diabetes Mellitus II Mother   . Heart disease Mother   . Lung cancer Father   . Diabetes Paternal Grandmother   . Diabetes Maternal Grandmother   . Kidney disease Neg Hx   . Prostate  cancer Neg Hx   . Bladder Cancer Neg Hx     Social History Social History   Tobacco Use  . Smoking status: Never Smoker  . Smokeless tobacco: Never Used  Substance Use Topics  . Alcohol use: No    Alcohol/week: 0.0 standard drinks  . Drug use: No    Review of Systems Constitutional: Negative for fever. Cardiovascular: Negative for chest pain. Respiratory: Negative for shortness of breath. Gastrointestinal: Negative for abdominal pain Musculoskeletal: Right leg/right knee pain Neurological: Negative for headache All other ROS negative  ____________________________________________   PHYSICAL EXAM:  VITAL SIGNS: ED Triage Vitals  Enc Vitals Group     BP 02/01/20 1849 (!) 129/56     Pulse Rate 02/01/20 1849 (!) 55     Resp 02/01/20 1849 16     Temp 02/01/20 1849 97.9 F (36.6 C)     Temp Source 02/01/20 1849 Oral     SpO2 02/01/20 1849 96 %     Weight 02/01/20 1851 188 lb (85.3 kg)     Height 02/01/20 1851 5\' 5"  (1.651 m)     Head Circumference --      Peak Flow --      Pain Score 02/01/20 1851 9     Pain Loc --      Pain Edu? --      Excl. in GC? --    Constitutional: Alert and oriented. Well appearing and in no distress. Eyes: Normal exam ENT      Head: Normocephalic and atraumatic.      Mouth/Throat: Mucous membranes are moist. Cardiovascular: Normal rate, regular rhythm.  Respiratory: Normal respiratory effort without tachypnea nor retractions. Breath sounds are clear Gastrointestinal: Soft and nontender. No distention.   Musculoskeletal: No knee tenderness on exam.  No popliteal tenderness.  Good range of motion.  No significant discomfort noted with range of motion.  Neurovascular intact distally. Neurologic:  Normal speech and language. No gross focal neurologic deficits  Skin:  Skin is warm, dry and intact.  Psychiatric: Mood and affect are normal.    RADIOLOGY  X-ray of the pelvis is negative.  Knee x-ray shows tricompartmental osteoarthritis with  a small effusion.  ____________________________________________   INITIAL IMPRESSION / ASSESSMENT AND PLAN / ED COURSE  Pertinent labs & imaging results that were available during my care of the patient were reviewed by me and considered in my medical decision making (see chart for details).   Patient presents to the emergency department for right leg pain after a fall yesterday.  Patient is mainly complaining of right knee pain.  No other findings on exam.  No other complaints.  Largely negative review of systems.  She has good range of motion on exam.  Neurovascular intact distally.  We will obtain x-ray images of the right knee and pelvis as a precaution.  X-ray most consistent with significant arthritis with a small joint effusion.  Possible cartilaginous or ligamentous injury.  No bony injury identified on imaging.  Patient uses a walker.  We will discharge the patient home with orthopedic follow-up.  Patient agreeable to plan of care.  LIEL RUDDEN was evaluated in Emergency Department on 02/01/2020 for the symptoms described in the history of present illness. She was evaluated in the context of the global COVID-19 pandemic, which necessitated consideration that the patient might be at risk for infection with the SARS-CoV-2 virus that causes COVID-19. Institutional protocols and algorithms that pertain to the evaluation of patients at risk for COVID-19 are in a state of rapid change based on information released by regulatory bodies including the CDC and federal and state organizations. These policies and algorithms were followed during the patient's care in the ED.  ____________________________________________   FINAL CLINICAL IMPRESSION(S) / ED DIAGNOSES  Right knee pain Thresa Ross, MD 02/01/20 1946

## 2020-02-01 NOTE — ED Notes (Signed)
Attempted to call report to [facility], phone number listed states they are closed and to leave a voicemail and they will call back.

## 2020-02-01 NOTE — ED Notes (Signed)
PT to XRAY

## 2020-02-01 NOTE — ED Notes (Signed)
Patient resting in bed at this time. Patient laying in bed and denies any needs. Call light in reach, willl continue to monitor.

## 2020-02-01 NOTE — ED Triage Notes (Signed)
Pt arrived via [facility] from [place] with a fall yesterday and right leg pain. Pt is ambulatory.

## 2020-02-08 ENCOUNTER — Other Ambulatory Visit: Payer: Self-pay

## 2020-02-08 ENCOUNTER — Emergency Department
Admission: EM | Admit: 2020-02-08 | Discharge: 2020-02-08 | Disposition: A | Discharge status: Home / Self Care | Payer: Medicare Other | Attending: Emergency Medicine | Admitting: Emergency Medicine

## 2020-02-08 DIAGNOSIS — T83021A Displacement of indwelling urethral catheter, initial encounter: Secondary | ICD-10-CM | POA: Diagnosis not present

## 2020-02-08 DIAGNOSIS — N189 Chronic kidney disease, unspecified: Secondary | ICD-10-CM | POA: Diagnosis not present

## 2020-02-08 DIAGNOSIS — Y731 Therapeutic (nonsurgical) and rehabilitative gastroenterology and urology devices associated with adverse incidents: Secondary | ICD-10-CM | POA: Diagnosis not present

## 2020-02-08 DIAGNOSIS — Z7902 Long term (current) use of antithrombotics/antiplatelets: Secondary | ICD-10-CM | POA: Insufficient documentation

## 2020-02-08 DIAGNOSIS — I130 Hypertensive heart and chronic kidney disease with heart failure and stage 1 through stage 4 chronic kidney disease, or unspecified chronic kidney disease: Secondary | ICD-10-CM | POA: Diagnosis not present

## 2020-02-08 DIAGNOSIS — E1122 Type 2 diabetes mellitus with diabetic chronic kidney disease: Secondary | ICD-10-CM | POA: Diagnosis not present

## 2020-02-08 DIAGNOSIS — I252 Old myocardial infarction: Secondary | ICD-10-CM | POA: Diagnosis not present

## 2020-02-08 DIAGNOSIS — I509 Heart failure, unspecified: Secondary | ICD-10-CM | POA: Diagnosis not present

## 2020-02-08 DIAGNOSIS — Z79899 Other long term (current) drug therapy: Secondary | ICD-10-CM | POA: Diagnosis not present

## 2020-02-08 DIAGNOSIS — T83028A Displacement of other indwelling urethral catheter, initial encounter: Secondary | ICD-10-CM | POA: Diagnosis present

## 2020-02-08 DIAGNOSIS — Z794 Long term (current) use of insulin: Secondary | ICD-10-CM | POA: Insufficient documentation

## 2020-02-08 NOTE — ED Notes (Signed)
Pt given warm blanket.

## 2020-02-08 NOTE — ED Notes (Signed)
Called Equipment to have urology cart delivered to ED

## 2020-02-08 NOTE — ED Provider Notes (Signed)
[facility] Emergency Department Provider Note _______________   First MD Initiated Contact with Patient 02/08/20 [phone]     (approximate)  I have reviewed the triage vital signs and the nursing notes.   HISTORY  Chief Complaint Dysuria (need new urinary catheter placed)   HPI Pamela [name] is a 86 y.o. female with below list of previous medical conditions including chronically indwelling Foley catheter presents to the emergency department secondary to dislodgment of the Foley catheter.  Patient denies any discomfort at present.  Patient states that this is happened multiple times in the past        Past Medical History:  Diagnosis Date  . A-fib (HCC) 08/28/2014   Overview:  Overview:  Diastolic dysfunction on ECHO 04/2012-done at [facility]   . Abnormal gait 02/08/2012  . Abscess or cellulitis of leg 04/18/2012  . Absence of bladder continence 07/29/2012  . Acid reflux 02/08/2012  . Acute kidney failure (HCC) 05/15/2012  . Afib, paroxysmal, with RVR at times with BBB 05/13/2012  . Allergic drug rash 05/15/2012  . Anemia 05/13/2012  . 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 Benign essential HTN 02/08/2012  . Bladder infection, chronic 06/29/2013  . BP (high blood pressure) 05/12/2012  . Bursitis of knee 08/07/2014  . Cellulitis in diabetic foot (HCC) 05/12/2012  . CHF (congestive heart failure) (HCC)   . Chronic kidney disease 06/01/2012   Overview:  Overview:   12/2012- Renal US, simple L cyst. UIEP normal   . Clinical depression 08/28/2014  . CN (constipation) 03/17/2012  . Dermatitis due to drug reaction 05/15/2012  . Diabetes mellitus   . Diastolic dysfunction, left ventricle 05/14/2012  . DM (diabetes mellitus) (HCC) 05/12/2012  . Dysrhythmia   . Dysuria   . Edema leg 07/02/2014  . Enthesopathy of knee 08/07/2014  . Excess fluid volume 05/20/2012  . FOM (frequency of micturition) 07/29/2012  . Frank hematuria 09/27/2014  . Gangrene (HCC)   . GERD (gastroesophageal  reflux disease)   . Heart disease   . High cholesterol   . HTN (hypertension) 05/12/2012  . Hypercholesterolemia 05/12/2012  . Hypertension   . Incomplete bladder emptying 07/29/2012  . Left bundle branch block (LBBB)   . Open wound of knee, leg (except thigh), and ankle 06/01/2012  . 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   Overview:  Overview:  Several years ago, allergic reaction to sulfa antibiotic Several years ago, allergic reaction to sulfa antibiotic     Patient Active Problem List   Diagnosis Date Noted  . NSTEMI (non-ST elevated myocardial infarction) (HCC) 11/28/2018  . UTI (urinary tract infection) 11/28/2017  . Pelvic muscle wasting 01/26/2017  . Cystocele, midline 01/26/2017  . Urinary frequency 12/26/2015  . Incontinence 12/07/2015  . Cystocele 12/07/2015  . Tinea cruris 12/07/2015  . Recurrent UTI 08/11/2015  . Vaginal atrophy 08/11/2015  . 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 Discoordination 05/08/2015  . Frank hematuria 09/27/2014  . Clinical depression 08/28/2014  . Major depressive disorder with single episode 08/28/2014  . Bursitis of knee 08/07/2014  . Enthesopathy of knee 08/07/2014  . Edema leg 07/02/2014  . Bladder infection, chronic 06/29/2013  . Incomplete bladder emptying 07/29/2012  . FOM (frequency of micturition) 07/29/2012  . Absence of bladder continence 07/29/2012  . Chronic kidney disease 06/01/2012  . Open wound of knee, leg (except thigh), and ankle 06/01/2012  . Skin rash 05/20/2012  . Acute kidney failure (HCC) 05/15/2012  . Allergic drug rash 05/15/2012  . Stevens-Johnson syndrome (  HCC) 05/15/2012  . Dermatitis due to drug taken internally 05/15/2012  . Dermatitis due to drug reaction 05/15/2012  . Acute renal failure (HCC) 05/15/2012  . Diastolic dysfunction, left ventricle 05/14/2012  . PAF (paroxysmal atrial fibrillation) (HCC) 05/13/2012  . Anemia 05/13/2012  . Cellulitis in diabetic foot (HCC) 05/12/2012  . HTN (hypertension)  05/12/2012  . Hypercholesterolemia 05/12/2012  . Abscess or cellulitis of leg 04/18/2012  . Diabetes mellitus (HCC) 04/18/2012  . CN (constipation) 03/17/2012  . Abnormal gait 02/08/2012  . Benign essential HTN 02/08/2012  . GERD (gastroesophageal reflux disease) 02/08/2012    Past Surgical History:  Procedure Laterality Date  . ABDOMINAL HYSTERECTOMY    . BLADDER SURGERY    . cataract surgery    . ENDOVENOUS ABLATION SAPHENOUS VEIN W/ LASER    . LEFT HEART CATH AND CORONARY ANGIOGRAPHY N/A 11/29/2018   Procedure: LEFT HEART CATH AND CORONARY ANGIOGRAPHY;  Surgeon: Lamar Blinks, MD;  Location: [facility] IN[facility];  Service: Cardiovascular;  Laterality: N/A;  . NASAL SINUS SURGERY    . TOE AMPUTATION    . TONSILLECTOMY      Prior to Admission medications   Medication Sig Start Date End Date Taking? Authorizing Provider  amiodarone (PACERONE) 200 MG tablet Take 200 mg by mouth daily.  12/16/15   [provider]  amitriptyline (ELAVIL) 50 MG tablet Take 50 mg by mouth at bedtime.  03/31/16   [provider]  amLODipine (NORVASC) 5 MG tablet Take 5 mg by mouth daily.    [provider]  atorvastatin (LIPITOR) 40 MG tablet Take 40 mg by mouth daily.    [provider]  bisacodyl (BISACODYL) 5 MG EC tablet Take 1 tablet (5 mg total) by mouth daily as needed for moderate constipation. 11/30/18   Shaune Pollack, MD  clopidogrel (PLAVIX) 75 MG tablet Take 1 tablet (75 mg total) by mouth daily. 11/30/18   Shaune Pollack, MD  hydrocortisone 1 % ointment Apply 1 application topically 2 (two) times daily. Please apply liberally to your vulva twice a day to help prevent further irritation 12/06/18   Merrily Brittle, MD  insulin lispro protamine-lispro (HUMALOG 75/25 MIX) (75-25) 100 UNIT/ML SUSP injection Inject 20 Units into the skin daily.    [provider]  insulin lispro protamine-lispro (HUMALOG 75/25 MIX) (75-25) 100 UNIT/ML SUSP injection Inject 18 Units  into the skin at bedtime.    [provider]  memantine (NAMENDA) 5 MG tablet Take 5 mg by mouth 2 (two) times daily. 12/15/18 12/15/19  [provider]  metoprolol tartrate (LOPRESSOR) 25 MG tablet Take 25 mg by mouth 2 (two) times daily.    [provider]  nitroGLYCERIN (NITROSTAT) 0.4 MG SL tablet Place 1 tablet (0.4 mg total) under the tongue every 5 (five) minutes as needed for chest pain. 11/30/18   Shaune Pollack, MD  NOVOLOG MIX 70/30 FLEXPEN (70-30) 100 UNIT/ML FlexPen Inject 0.12 mLs (12 Units total) into the skin 2 (two) times daily with a meal. Patient not taking: Reported on 11/28/2018 11/30/17   Adrian Saran, MD  pantoprazole (PROTONIX) 40 MG tablet Take 40 mg by mouth daily.    [provider]  torsemide (DEMADEX) 20 MG tablet Take 20 mg by mouth daily. 08/17/19   [provider]  vitamin B-12 (CYANOCOBALAMIN) 1000 MCG tablet Take 1,000 mcg by mouth daily.    [provider]    Allergies Ciprofloxacin, Sulfamethoxazole-trimethoprim, Augmentin [amoxicillin-pot clavulanate], Hydrochlorothiazide, Levofloxacin, Losartan potassium-hctz, Nitrofurantoin, Sulfa  antibiotics, Sulfasalazine, and Levaquin [levofloxacin in d5w]  Family History  Problem Relation Age of Onset  . Diabetes Mellitus II Mother   . Heart disease Mother   . Lung cancer Father   . Diabetes Paternal Grandmother   . Diabetes Maternal Grandmother   . Kidney disease Neg Hx   . Prostate cancer Neg Hx   . Bladder Cancer Neg Hx     Social History Social History   Tobacco Use  . Smoking status: Never Smoker  . Smokeless tobacco: Never Used  Substance Use Topics  . Alcohol use: No    Alcohol/week: 0.0 standard drinks  . Drug use: No    Review of Systems Constitutional: No fever/chills Eyes: No visual changes. ENT: No sore throat. Cardiovascular: Denies chest pain. Respiratory: Denies shortness of breath. Gastrointestinal: No abdominal pain.  No nausea, no  vomiting.  No diarrhea.  No constipation. Genitourinary: Negative for dysuria.  Dislodged Foley catheter Musculoskeletal: Negative for neck pain.  Negative for back pain. Integumentary: Negative for rash. Neurological: Negative for headaches, focal weakness or numbness.  ____________________________________________   PHYSICAL EXAM:  VITAL SIGNS: ED Triage Vitals  Enc Vitals Group     BP 02/08/20 0418 (!) 136/55     Pulse Rate 02/08/20 0418 61     Resp 02/08/20 0418 20     Temp 02/08/20 0418 98 F (36.7 C)     Temp Source 02/08/20 0418 Oral     SpO2 02/08/20 0418 94 %     Weight 02/08/20 0420 85.3 kg (188 lb)     Height 02/08/20 0420 1.651 m (5\' 5" )     Head Circumference --      Peak Flow --      Pain Score 02/08/20 0420 0     Pain Loc --      Pain Edu? --      Excl. in GC? --     Constitutional: Alert and oriented.  Eyes: Conjunctivae are normal.  Mouth/Throat: Patient is wearing a mask. Neck: No stridor.  No meningeal signs.   Cardiovascular: Normal rate, regular rhythm. Good peripheral circulation. Grossly normal heart sounds. Respiratory: Normal respiratory effort.  No retractions. Gastrointestinal: Soft and nontender. No distention.  Genitourinary: No gross abnormality Musculoskeletal: No lower extremity tenderness nor edema. No gross deformities of extremities. Neurologic:  Normal speech and language. No gross focal neurologic deficits are appreciated.  Skin:  Skin is warm, dry and intact. Psychiatric: Mood and affect are normal. Speech and behavior are normal.    Procedures   ____________________________________________   INITIAL IMPRESSION / MDM / ASSESSMENT AND PLAN / ED COURSE  As part of my medical decision making, I reviewed the following data within the electronic MEDICAL RECORD NUMBER   86 year old female presented with above-stated history and physical exam secondary to dislodged Foley catheter.  Larger catheter inserted in hopes of preventing the  catheter from being dislodged ____________________________________________  FINAL CLINICAL IMPRESSION(S) / ED DIAGNOSES  Final diagnoses:  Displacement of Foley catheter, initial encounter (HCC)     MEDICATIONS GIVEN DURING THIS VISIT:  Medications - No data to display   ED Discharge Orders    None      *Please note:  Pamela [name] was evaluated in Emergency Department on 02/08/2020 for the symptoms described in the history of present illness. She was evaluated in the context of the global COVID-19 pandemic, which necessitated consideration that the patient might be at risk for infection with the SARS-CoV-2 virus that causes  COVID-19. Institutional protocols and algorithms that pertain to the evaluation of patients at risk for COVID-19 are in a state of rapid change based on information released by regulatory bodies including the CDC and federal and state organizations. These policies and algorithms were followed during the patient's care in the ED.  Some ED evaluations and interventions may be delayed as a result of limited staffing during the pandemic.*  Note:  This document was prepared using Dragon voice recognition software and may include unintentional dictation errors.   Gregor Hams, MD 02/08/20 [phone]

## 2020-02-08 NOTE — ED Triage Notes (Signed)
From [facility] via [employer].  Pt states her catheter came out in bed while she was sleeping; she states this is the third time since mid-January.  She states she needs a new one inserted.  Pt is HOH; alert and oriented x4 upon arrival.  EMS reports vitals are normal.

## 2020-02-08 NOTE — ED Notes (Signed)
Pt assisted to toilet. Pt had large soft BM. Pt assisted back to bed. Pt repositioned and resting comfortably at this time.

## 2020-02-08 NOTE — ED Notes (Signed)
EMS at bedside, pt unable to reach signature pad. Pt verbalized discharge instructions and has no questions. Facility already called by prior RN Lurena Joiner and informed pt would be returning once EMS arrived.

## 2020-02-08 NOTE — ED Notes (Signed)
Report called to [facility] [phone] Joni Reining; instructed her to document 20Fr catheter with 30cc balloon was placed.

## 2020-02-22 ENCOUNTER — Encounter: Payer: Self-pay | Admitting: Urology

## 2020-02-22 ENCOUNTER — Ambulatory Visit (INDEPENDENT_AMBULATORY_CARE_PROVIDER_SITE_OTHER): Payer: Medicare Other | Admitting: Urology

## 2020-02-22 ENCOUNTER — Other Ambulatory Visit: Payer: Self-pay

## 2020-02-22 VITALS — BP 121/70 | HR 60 | Ht 60.0 in | Wt 189.4 lb

## 2020-02-22 DIAGNOSIS — T839XXA Unspecified complication of genitourinary prosthetic device, implant and graft, initial encounter: Secondary | ICD-10-CM

## 2020-02-22 NOTE — Progress Notes (Signed)
02/22/2020 2:54 PM   Pamela [name] 1934-06-09 [id number]  Referring provider: No referring provider defined for this encounter.  Chief Complaint  Patient presents with  . Follow-up    HPI: Pamela [name] is an 86 year old female with a history of rUTI's, mixed incontinence, vaginal atrophy and urinary retention managed with a Foley catheter who presents today to discuss SPT placement.  She currently resides at a assisted living facility and caregiver states that her Foley catheter keeps coming out.  There has been a request to consider SPT placement.  Pamela [name] has already been tried with SPT and unfortunately the SPT also continued to be dislodged.   It is my suspicion that Pamela [name] is accidentally getting the leg bag or the Foley tubing wrapped around objects or her leg and inadvertently causing a traumatic Foley removal.  Patient denies any modifying or aggravating factors.  Patient denies any gross hematuria, dysuria or suprapubic/flank pain.  Patient denies any fevers, chills, nausea or vomiting.    PMH: Past Medical History:  Diagnosis Date  . A-fib ([place]) 08/28/2014   Overview:  Overview:  Diastolic dysfunction on ECHO 04/2012-done at [facility]   . Abnormal gait 02/08/2012  . Abscess or cellulitis of leg 04/18/2012  . Absence of bladder continence 07/29/2012  . Acid reflux 02/08/2012  . Acute kidney failure ([place]) 05/15/2012  . Afib, paroxysmal, with RVR at times with BBB 05/13/2012  . Allergic drug rash 05/15/2012  . Anemia 05/13/2012  . 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 Benign essential HTN 02/08/2012  . Bladder infection, chronic 06/29/2013  . BP (high blood pressure) 05/12/2012  . Bursitis of knee 08/07/2014  . Cellulitis in diabetic foot ([place]) 05/12/2012  . CHF (congestive heart failure) ([place])   . Chronic kidney disease 06/01/2012   Overview:  Overview:   12/2012- Renal US, simple L cyst. UIEP normal   . Clinical depression 08/28/2014  . CN (constipation) 03/17/2012    . Dermatitis due to drug reaction 05/15/2012  . Diabetes mellitus   . Diastolic dysfunction, left ventricle 05/14/2012  . DM (diabetes mellitus) ([place]) 05/12/2012  . Dysrhythmia   . Dysuria   . Edema leg 07/02/2014  . Enthesopathy of knee 08/07/2014  . Excess fluid volume 05/20/2012  . FOM (frequency of micturition) 07/29/2012  . Frank hematuria 09/27/2014  . Gangrene ([place])   . GERD (gastroesophageal reflux disease)   . Heart disease   . High cholesterol   . HTN (hypertension) 05/12/2012  . Hypercholesterolemia 05/12/2012  . Hypertension   . Incomplete bladder emptying 07/29/2012  . Left bundle branch block (LBBB)   . Open wound of knee, leg (except thigh), and ankle 06/01/2012  . Stevens-Johnson syndrome ([place]) 05/15/2012   Overview:  Overview:  Several years ago, allergic reaction to sulfa antibiotic Several years ago, allergic reaction to sulfa antibiotic     Surgical History: Past Surgical History:  Procedure Laterality Date  . ABDOMINAL HYSTERECTOMY    . BLADDER SURGERY    . cataract surgery    . ENDOVENOUS ABLATION SAPHENOUS VEIN W/ LASER    . LEFT HEART CATH AND CORONARY ANGIOGRAPHY N/A 11/29/2018   Procedure: LEFT HEART CATH AND CORONARY ANGIOGRAPHY;  Surgeon: Corey Skains, MD;  Location: [facility];  Service: Cardiovascular;  Laterality: N/A;  . NASAL SINUS SURGERY    . TOE AMPUTATION    . TONSILLECTOMY      Home Medications:  Allergies as of 02/22/2020  Reactions   Ciprofloxacin Anaphylaxis   Sulfamethoxazole-trimethoprim Other (See Comments), Rash   Went into [employer] [place].   Augmentin [amoxicillin-pot Clavulanate] Hives   Hydrochlorothiazide    Levofloxacin Other (See Comments)   Losartan Potassium-hctz Other (See Comments)   Other reaction(s): Unknown   Nitrofurantoin Other (See Comments)   Sulfa Antibiotics Other (See Comments)   Kathreen Cosier Steven's Johnsons   Sulfasalazine    unknown   Levaquin [levofloxacin In D5w]        Medication List       Accurate as of February 22, 2020 11:59 PM. If you have any questions, ask your nurse or doctor.        amiodarone 200 MG tablet Commonly known as: PACERONE Take 200 mg by mouth daily.   amitriptyline 50 MG tablet Commonly known as: ELAVIL Take 50 mg by mouth at bedtime.   amLODipine 5 MG tablet Commonly known as: NORVASC Take 5 mg by mouth daily.   atorvastatin 40 MG tablet Commonly known as: LIPITOR Take 40 mg by mouth daily.   bisacodyl 5 MG EC tablet Commonly known as: bisacodyl Take 1 tablet (5 mg total) by mouth daily as needed for moderate constipation.   clopidogrel 75 MG tablet Commonly known as: PLAVIX Take 1 tablet (75 mg total) by mouth daily.   Glucagon Emergency 1 MG Kit   hydrocortisone 1 % ointment Apply 1 application topically 2 (two) times daily. Please apply liberally to your vulva twice a day to help prevent further irritation   insulin lispro protamine-lispro (75-25) 100 UNIT/ML Susp injection Commonly known as: HUMALOG 75/25 MIX Inject 20 Units into the skin daily.   insulin lispro protamine-lispro (75-25) 100 UNIT/ML Susp injection Commonly known as: HUMALOG 75/25 MIX Inject 18 Units into the skin at bedtime.   memantine 10 MG tablet Commonly known as: NAMENDA Take 10 mg by mouth 2 (two) times daily.   metoprolol tartrate 25 MG tablet Commonly known as: LOPRESSOR Take 25 mg by mouth 2 (two) times daily.   nitroGLYCERIN 0.4 MG SL tablet Commonly known as: NITROSTAT Place 1 tablet (0.4 mg total) under the tongue every 5 (five) minutes as needed for chest pain.   NovoLOG Mix 70/30 FlexPen (70-30) 100 UNIT/ML FlexPen Generic drug: insulin aspart protamine - aspart Inject 0.12 mLs (12 Units total) into the skin 2 (two) times daily with a meal.   Olopatadine HCl 0.2 % Soln   pantoprazole 40 MG tablet Commonly known as: PROTONIX Take 40 mg by mouth daily.   torsemide 20 MG tablet Commonly known as: DEMADEX Take 20  mg by mouth daily.   traMADol 50 MG tablet Commonly known as: ULTRAM Take 50 mg by mouth 2 (two) times daily as needed.   vitamin B-12 1000 MCG tablet Commonly known as: CYANOCOBALAMIN Take 1,000 mcg by mouth daily.       Allergies:  Allergies  Allergen Reactions  . Ciprofloxacin Anaphylaxis  . Sulfamethoxazole-Trimethoprim Other (See Comments) and Rash    Went into [employer].  . Augmentin [Amoxicillin-Pot Clavulanate] Hives  . Hydrochlorothiazide   . Levofloxacin Other (See Comments)  . Losartan Potassium-Hctz Other (See Comments)    Other reaction(s): Unknown  . Nitrofurantoin Other (See Comments)  . Sulfa Antibiotics Other (See Comments)    Kathreen Cosier Steven's Johnsons  . Sulfasalazine     unknown  . Levaquin [Levofloxacin In D5w]     Family History: Family History  Problem Relation Age of Onset  . Diabetes Mellitus  II Mother   . Heart disease Mother   . Lung cancer Father   . Diabetes Paternal Grandmother   . Diabetes Maternal Grandmother   . Kidney disease Neg Hx   . Prostate cancer Neg Hx   . Bladder Cancer Neg Hx     Social History:  reports that she has never smoked. She has never used smokeless tobacco. She reports that she does not drink alcohol or use drugs.  ROS: Pertinent ROS in HPI  Physical Exam: BP 121/70 (BP Location: Left Arm, Patient Position: Sitting, Cuff Size: Large)   Pulse 60   Ht 5' (1.524 m)   Wt 189 lb 6.4 oz (85.9 kg)   BMI 36.99 kg/m   Constitutional:  Well nourished. Alert and oriented, No acute distress. HEENT: [place] AT, mask in place.  Trachea midline, no masses. Cardiovascular: No clubbing, cyanosis, or edema. Respiratory: Normal respiratory effort, no increased work of breathing. GI: Abdomen is soft, non tender, non distended, no abdominal masses.  GU: No CVA tenderness.  No bladder fullness or masses.  Foley in place draining yellow urine.  Recently place on 02/08/2020 in the ED.  Neurologic: Grossly  intact, no focal deficits, moving all 4 extremities. Psychiatric: Normal mood and affect.  Laboratory Data: Lab Results  Component Value Date   WBC 8.2 11/20/2019   HGB 12.1 11/20/2019   HCT 38.0 11/20/2019   MCV 98.4 11/20/2019   PLT 202 11/20/2019    Lab Results  Component Value Date   CREATININE 2.15 (H) 11/20/2019    Lab Results  Component Value Date   HGBA1C 7.6 (H) 05/13/2012    Lab Results  Component Value Date   TSH 2.546 05/19/2012       Component Value Date/Time   CHOL 119 07/08/2013 0739   HDL 58 07/08/2013 0739   VLDL 22 07/08/2013 0739   LDLCALC 39 07/08/2013 0739    Lab Results  Component Value Date   AST 22 11/20/2019   Lab Results  Component Value Date   ALT 22 11/20/2019    Urinalysis    Component Value Date/Time   COLORURINE YELLOW (A) 03/02/2020 2356   APPEARANCEUR CLOUDY (A) 03/02/2020 2356   APPEARANCEUR Cloudy (A) 03/09/2018 1429   LABSPEC 1.009 03/02/2020 2356   LABSPEC 1.010 07/16/2012 0655   PHURINE 6.0 03/02/2020 2356   GLUCOSEU NEGATIVE 03/02/2020 2356   GLUCOSEU 50 mg/dL 07/16/2012 0655   HGBUR LARGE (A) 03/02/2020 2356   BILIRUBINUR NEGATIVE 03/02/2020 2356   BILIRUBINUR Negative 03/09/2018 1429   BILIRUBINUR Negative 07/16/2012 0655   KETONESUR NEGATIVE 03/02/2020 2356   PROTEINUR 30 (A) 03/02/2020 2356   UROBILINOGEN 0.2 05/15/2012 0952   NITRITE POSITIVE (A) 03/02/2020 2356   LEUKOCYTESUR LARGE (A) 03/02/2020 2356   LEUKOCYTESUR Trace 07/16/2012 0655    I have reviewed the labs.   Pertinent Imaging: No recent imaging     Assessment & Plan:    1. Repeated Foley dislodgement Discussed with the charge nurse at her facility that Mrs. Calvario would best be served by having the Foley catheter removed and having her straight caths 3 times daily and if this is not possible, she will need to be monitored more closely to ensure she does not trip up on her Foley catheter and cause either dislodgment of the Foley or a  fall.  I did explain that we had already tried the SPT with Mrs. Amstutz and had the same issues with the repeated dislodgment of the suprapubic catheter as  well.  In order for patient to receive straight cathing or closer supervision, she will be need to move to a different area within the facility as her current living situation is independent living.  They will speak to her son regarding his wishes.   Return if symptoms worsen or fail to improve.  These notes generated with voice recognition software. I apologize for typographical errors.  Zara Council, PA-C  [facility] [address]  [place], [place] [address] [phone]  A total of 30 minutes were spent face-to-face with the patient and on the phone with [facility] RN greater than 50% was spent in patient education, counseling, and coordination of care regarding her Foley catheter.

## 2020-02-24 ENCOUNTER — Emergency Department: Payer: Medicare Other

## 2020-02-24 ENCOUNTER — Other Ambulatory Visit: Payer: Self-pay

## 2020-02-24 ENCOUNTER — Emergency Department
Admission: EM | Admit: 2020-02-24 | Discharge: 2020-02-24 | Disposition: A | Discharge status: Skilled Nursing Facility | Payer: Medicare Other | Attending: Student in an Organized Health Care Education/Training Program | Admitting: Student in an Organized Health Care Education/Training Program

## 2020-02-24 DIAGNOSIS — Y999 Unspecified external cause status: Secondary | ICD-10-CM | POA: Diagnosis not present

## 2020-02-24 DIAGNOSIS — R2241 Localized swelling, mass and lump, right lower limb: Secondary | ICD-10-CM | POA: Diagnosis not present

## 2020-02-24 DIAGNOSIS — S82444A Nondisplaced spiral fracture of shaft of right fibula, initial encounter for closed fracture: Secondary | ICD-10-CM | POA: Diagnosis not present

## 2020-02-24 DIAGNOSIS — Z794 Long term (current) use of insulin: Secondary | ICD-10-CM | POA: Diagnosis not present

## 2020-02-24 DIAGNOSIS — M79661 Pain in right lower leg: Secondary | ICD-10-CM | POA: Diagnosis not present

## 2020-02-24 DIAGNOSIS — E119 Type 2 diabetes mellitus without complications: Secondary | ICD-10-CM | POA: Insufficient documentation

## 2020-02-24 DIAGNOSIS — Y9389 Activity, other specified: Secondary | ICD-10-CM | POA: Diagnosis not present

## 2020-02-24 DIAGNOSIS — Y92199 Unspecified place in other specified residential institution as the place of occurrence of the external cause: Secondary | ICD-10-CM | POA: Diagnosis not present

## 2020-02-24 DIAGNOSIS — S99911A Unspecified injury of right ankle, initial encounter: Secondary | ICD-10-CM | POA: Diagnosis present

## 2020-02-24 DIAGNOSIS — W19XXXA Unspecified fall, initial encounter: Secondary | ICD-10-CM | POA: Diagnosis not present

## 2020-02-24 DIAGNOSIS — L03115 Cellulitis of right lower limb: Secondary | ICD-10-CM | POA: Insufficient documentation

## 2020-02-24 MED ORDER — ACETAMINOPHEN 500 MG PO TABS
1000.0000 mg | ORAL_TABLET | Freq: Once | ORAL | Status: AC
  Administered 2020-02-24: 1000 mg via ORAL
  Filled 2020-02-24: qty 2

## 2020-02-24 MED ORDER — DOXYCYCLINE HYCLATE 100 MG PO TABS: 100.0000 mg | ORAL_TABLET | Freq: Two times a day (BID) | ORAL | 0 refills | Status: DC

## 2020-02-24 NOTE — ED Triage Notes (Signed)
Pt states she was trying to be independent yesterday and was putting something away. Turned around and fell on R foot/ankle. Pt arrives with foley in place. In personal wheelchair.

## 2020-02-24 NOTE — ED Notes (Signed)
Spoke with son. He is unable to transport back to [facility]. States with the splint, unable to walk and getting her w/c back is too much. Advised not to worry and I would arrange ems transport back.

## 2020-02-24 NOTE — ED Notes (Signed)
First Nurse Note: Pt to ED from [facility]. Pt fell Thursday morning and had a x-ray done. Results came back today and she has fx on her right foot. Pt was sent to ED because they do not have a doctor on call there today.

## 2020-02-24 NOTE — ED Notes (Signed)
Ems is unable to take the w/c with the pt back to [facility]. Chart sticker placed on chair and taken to pt relations desk.

## 2020-02-24 NOTE — Discharge Instructions (Signed)
You were seen today for right ankle pain and swelling after a fall.  Your x-ray is consistent with a distal fibular fracture.  You have been placed in a splint.  Please use your walker at all times.  Your ultrasound was negative for DVT but we are treating you for a mild infection in your lower leg with antibiotics taken twice daily for 7 days.  This medication may cause nausea and diarrhea so please consume with food.  Elevate your leg as often as possible.  Follow-up with Ortho as an outpatient.  You may take Tylenol 1000 mg every 8 hours as needed for pain.

## 2020-02-24 NOTE — ED Notes (Signed)
Pt ate most of her food tray. [facility] called and was updated that the pt is just waiting for ambulance ride back to the facility.

## 2020-02-24 NOTE — ED Provider Notes (Signed)
[facility] Emergency Department Provider Note ____________________________________________  Time seen: 1500  I have reviewed the triage vital signs and the nursing notes.  HISTORY  Chief Complaint  Fall   HPI Pamela [name] is a 86 y.o. female presents to the ER with complaint of right lower leg pain, redness and swelling status post a fall that occurred yesterday according to her but 3 days ago according to the assisted living she lives at.  She describes the pain as a constant, sharp pain.  The pain does not radiate.  The pain is worse with ambulation.  She reports neuropathic pain in her right lower extremity which is not worse since her fall.  She has taken Tylenol OTC with some relief of symptoms.  Past Medical History:  Diagnosis Date  . A-fib ([place]) 08/28/2014   Overview:  Overview:  Diastolic dysfunction on ECHO 04/2012-done at [facility]   . Abnormal gait 02/08/2012  . Abscess or cellulitis of leg 04/18/2012  . Absence of bladder continence 07/29/2012  . Acid reflux 02/08/2012  . Acute kidney failure ([place]) 05/15/2012  . Afib, paroxysmal, with RVR at times with BBB 05/13/2012  . Allergic drug rash 05/15/2012  . Anemia 05/13/2012  . 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 Benign essential HTN 02/08/2012  . Bladder infection, chronic 06/29/2013  . BP (high blood pressure) 05/12/2012  . Bursitis of knee 08/07/2014  . Cellulitis in diabetic foot ([place]) 05/12/2012  . CHF (congestive heart failure) ([place])   . Chronic kidney disease 06/01/2012   Overview:  Overview:   12/2012- Renal US, simple L cyst. UIEP normal   . Clinical depression 08/28/2014  . CN (constipation) 03/17/2012  . Dermatitis due to drug reaction 05/15/2012  . Diabetes mellitus   . Diastolic dysfunction, left ventricle 05/14/2012  . DM (diabetes mellitus) ([place]) 05/12/2012  . Dysrhythmia   . Dysuria   . Edema leg 07/02/2014  . Enthesopathy of knee 08/07/2014  . Excess fluid volume 05/20/2012  . FOM (frequency of  micturition) 07/29/2012  . Frank hematuria 09/27/2014  . Gangrene ([place])   . GERD (gastroesophageal reflux disease)   . Heart disease   . High cholesterol   . HTN (hypertension) 05/12/2012  . Hypercholesterolemia 05/12/2012  . Hypertension   . Incomplete bladder emptying 07/29/2012  . Left bundle branch block (LBBB)   . Open wound of knee, leg (except thigh), and ankle 06/01/2012  . Stevens-Johnson syndrome ([place]) 05/15/2012   Overview:  Overview:  Several years ago, allergic reaction to sulfa antibiotic Several years ago, allergic reaction to sulfa antibiotic     Patient Active Problem List   Diagnosis Date Noted  . NSTEMI (non-ST elevated myocardial infarction) ([place]) 11/28/2018  . UTI (urinary tract infection) 11/28/2017  . Pelvic muscle wasting 01/26/2017  . Cystocele, midline 01/26/2017  . Urinary frequency 12/26/2015  . Incontinence 12/07/2015  . Cystocele 12/07/2015  . Tinea cruris 12/07/2015  . Recurrent UTI 08/11/2015  . Vaginal atrophy 08/11/2015  . 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 Discoordination 05/08/2015  . Frank hematuria 09/27/2014  . Clinical depression 08/28/2014  . Major depressive disorder with single episode 08/28/2014  . Bursitis of knee 08/07/2014  . Enthesopathy of knee 08/07/2014  . Edema leg 07/02/2014  . Bladder infection, chronic 06/29/2013  . Incomplete bladder emptying 07/29/2012  . FOM (frequency of micturition) 07/29/2012  . Absence of bladder continence 07/29/2012  . Chronic kidney disease 06/01/2012  . Open wound of knee, leg (except thigh), and ankle 06/01/2012  .  Skin rash 05/20/2012  . Acute kidney failure ([place]) 05/15/2012  . Allergic drug rash 05/15/2012  . Stevens-Johnson syndrome ([place]) 05/15/2012  . Dermatitis due to drug taken internally 05/15/2012  . Dermatitis due to drug reaction 05/15/2012  . Acute renal failure ([place]) 05/15/2012  . Diastolic dysfunction, left ventricle 05/14/2012  . PAF (paroxysmal atrial fibrillation) ([place])  05/13/2012  . Anemia 05/13/2012  . Cellulitis in diabetic foot ([place]) 05/12/2012  . HTN (hypertension) 05/12/2012  . Hypercholesterolemia 05/12/2012  . Abscess or cellulitis of leg 04/18/2012  . Diabetes mellitus ([place]) 04/18/2012  . CN (constipation) 03/17/2012  . Abnormal gait 02/08/2012  . Benign essential HTN 02/08/2012  . GERD (gastroesophageal reflux disease) 02/08/2012    Past Surgical History:  Procedure Laterality Date  . ABDOMINAL HYSTERECTOMY    . BLADDER SURGERY    . cataract surgery    . ENDOVENOUS ABLATION SAPHENOUS VEIN W/ LASER    . LEFT HEART CATH AND CORONARY ANGIOGRAPHY N/A 11/29/2018   Procedure: LEFT HEART CATH AND CORONARY ANGIOGRAPHY;  Surgeon: Corey Skains, MD;  Location: [facility];  Service: Cardiovascular;  Laterality: N/A;  . NASAL SINUS SURGERY    . TOE AMPUTATION    . TONSILLECTOMY      Prior to Admission medications   Medication Sig Start Date End Date Taking? Authorizing Provider  amiodarone (PACERONE) 200 MG tablet Take 200 mg by mouth daily.  12/16/15   [provider]  amitriptyline (ELAVIL) 50 MG tablet Take 50 mg by mouth at bedtime.  03/31/16   [provider]  amLODipine (NORVASC) 5 MG tablet Take 5 mg by mouth daily.    [provider]  atorvastatin (LIPITOR) 40 MG tablet Take 40 mg by mouth daily.    [provider]  bisacodyl (BISACODYL) 5 MG EC tablet Take 1 tablet (5 mg total) by mouth daily as needed for moderate constipation. 11/30/18   Demetrios Loll, MD  clopidogrel (PLAVIX) 75 MG tablet Take 1 tablet (75 mg total) by mouth daily. 11/30/18   Demetrios Loll, MD  doxycycline (VIBRA-TABS) 100 MG tablet Take 1 tablet (100 mg total) by mouth 2 (two) times daily. 02/24/20   Jearld Fenton, NP  Glucagon, rDNA, (GLUCAGON EMERGENCY) 1 MG KIT  11/21/19   [provider]  hydrocortisone 1 % ointment Apply 1 application topically 2 (two) times daily. Please apply liberally to your vulva twice a day to  help prevent further irritation 12/06/18   Darel Hong, MD  insulin lispro protamine-lispro (HUMALOG 75/25 MIX) (75-25) 100 UNIT/ML SUSP injection Inject 20 Units into the skin daily.    [provider]  insulin lispro protamine-lispro (HUMALOG 75/25 MIX) (75-25) 100 UNIT/ML SUSP injection Inject 18 Units into the skin at bedtime.    [provider]  memantine (NAMENDA) 10 MG tablet Take 10 mg by mouth 2 (two) times daily. 01/30/20   [provider]  metoprolol tartrate (LOPRESSOR) 25 MG tablet Take 25 mg by mouth 2 (two) times daily.    [provider]  nitroGLYCERIN (NITROSTAT) 0.4 MG SL tablet Place 1 tablet (0.4 mg total) under the tongue every 5 (five) minutes as needed for chest pain. 11/30/18   Demetrios Loll, MD  NOVOLOG MIX 70/30 FLEXPEN (70-30) 100 UNIT/ML FlexPen Inject 0.12 mLs (12 Units total) into the skin 2 (two) times daily with a meal. 11/30/17   Bettey Costa, MD  Olopatadine HCl 0.2 % SOLN  02/06/20   [provider]  pantoprazole ([place])  40 MG tablet Take 40 mg by mouth daily.    [provider]  torsemide (DEMADEX) 20 MG tablet Take 20 mg by mouth daily. 08/17/19   [provider]  traMADol (ULTRAM) 50 MG tablet Take 50 mg by mouth 2 (two) times daily as needed. 11/07/19   [provider]  vitamin B-12 (CYANOCOBALAMIN) 1000 MCG tablet Take 1,000 mcg by mouth daily.    [provider]    Allergies Ciprofloxacin, Sulfamethoxazole-trimethoprim, Augmentin [amoxicillin-pot clavulanate], Hydrochlorothiazide, Levofloxacin, Losartan potassium-hctz, Nitrofurantoin, Sulfa antibiotics, Sulfasalazine, and Levaquin [levofloxacin in d5w]  Family History  Problem Relation Age of Onset  . Diabetes Mellitus II Mother   . Heart disease Mother   . Lung cancer Father   . Diabetes Paternal Grandmother   . Diabetes Maternal Grandmother   . Kidney disease Neg Hx   . Prostate cancer Neg Hx   . Bladder Cancer Neg Hx      Social History Social History   Tobacco Use  . Smoking status: Never Smoker  . Smokeless tobacco: Never Used  Substance Use Topics  . Alcohol use: No    Alcohol/week: 0.0 standard drinks  . Drug use: No    Review of Systems  Constitutional: Negative for fever, chills or body aches. Cardiovascular: Negative for chest pain or chest tightness. Respiratory: Negative for cough or shortness of breath. Musculoskeletal: Positive for right ankle pain and swelling, difficulty with gait.  Negative for knee or hip pain. Skin: Positive for redness and warmth of the right lower extremity.  Negative for abrasion or bruising. Neurological: Negative for chronic numbness and tingling in the right lower extremity.. ____________________________________________  PHYSICAL EXAM:  VITAL SIGNS: ED Triage Vitals  Enc Vitals Group     BP 02/24/20 1344 (!) 139/52     Pulse Rate 02/24/20 1344 (!) 54     Resp 02/24/20 1344 16     Temp 02/24/20 1344 97.9 F (36.6 C)     Temp Source 02/24/20 1344 Oral     SpO2 02/24/20 1344 97 %     Weight 02/24/20 1346 190 lb (86.2 kg)     Height 02/24/20 1346 _0  (1.651 m)     Head Circumference --      Peak Flow --      Pain Score 02/24/20 1346 7     Pain Loc --      Pain Edu? --      Excl. in [place]? --     Constitutional: Alert and oriented, mildly confused.  Obese, in no distress. Head: Normocephalic and atraumatic. Eyes: Conjunctivae are normal. Normal extraocular movements.  Wearing glasses. Cardiovascular: Normal rate, with intermittent ectopic beat.  No murmur.  Pedal pulses 1+ on the right, 2+ on the left.  2+ swelling noted of the right leg. Respiratory: Normal respiratory effort. No wheezes/rales/rhonchi. Gastrointestinal: Soft and nontender. No distention. Musculoskeletal: Normal plantarflexion, dorsiflexion and rotation of the right ankle but with pain.  Pain with palpation over the distal fibula.  Able to stand and bear weight on RLE but  painful. Neurologic: Awake.  Seems slightly confused. Skin: Redness and warmth noted of her RLE. ____________________________________________   RADIOLOGY  Imaging Orders     DG Ankle Complete Right     US Venous Img Lower Unilateral Right  ____________________________________________  PROCEDURES  .Ortho Injury Treatment  Date/Time: 02/24/2020 4:12 PM Performed by: Jearld Fenton, NP Authorized by: Jearld Fenton, NP   Consent:    Consent obtained:  Verbal  Consent given by:  Patient   Risks discussed:  Restricted joint movementInjury location: lower leg Location details: right lower leg Injury type: fracture Fracture type: fibular shaft Pre-procedure distal perfusion: normal Pre-procedure neurological function: normal Pre-procedure range of motion: reduced  Anesthesia: Local anesthesia used: no  Patient sedated: NoManipulation performed: no Immobilization: splint Splint type: ankle stirrup Post-procedure distal perfusion: normal Post-procedure neurological function: normal Post-procedure range of motion: unchanged Patient tolerance: patient tolerated the procedure well with no immediate complications     ____________________________________________  INITIAL IMPRESSION / ASSESSMENT AND PLAN / ED COURSE  Right Ankle Pain, Swelling, Redness s/p Fall:  Xray right ankle c/w right spiral non displaced distal fibular fracture Venous doppler RLE negative for DVT. Tylenol 1000 mg given in ER Pt placed in stirrup splint, will follow up with ortho as an outpatient Encouraged her to use her walker all the time, ambulate as tolerated Encouraged right lower extremity elevation RX for Doxcycline 100 mg BID x 7 days for mild cellulitis    I reviewed the patient's prescription history over the last 12 months in the multi-state controlled substances database(s) that includes [place], [place], [place], [place], [employer], [place], [place], [place], [place],  [place], [place], [place], [place], and [place].  Results were notable for no recent controlled substances. ____________________________________________  FINAL CLINICAL IMPRESSION(S) / ED DIAGNOSES  Final diagnoses:  Fall, initial encounter  Closed nondisplaced spiral fracture of shaft of right fibula, initial encounter  Cellulitis of right lower extremity      Jearld Fenton, NP 02/24/20 1613    Merlyn Lot, MD 02/26/20 0700

## 2020-03-02 ENCOUNTER — Encounter: Payer: Self-pay | Admitting: Emergency Medicine

## 2020-03-02 ENCOUNTER — Other Ambulatory Visit: Payer: Self-pay

## 2020-03-02 ENCOUNTER — Emergency Department
Admission: EM | Admit: 2020-03-02 | Discharge: 2020-03-03 | Disposition: A | Discharge status: Home / Self Care | Payer: Medicare Other | Attending: Emergency Medicine | Admitting: Emergency Medicine

## 2020-03-02 DIAGNOSIS — E1122 Type 2 diabetes mellitus with diabetic chronic kidney disease: Secondary | ICD-10-CM | POA: Diagnosis not present

## 2020-03-02 DIAGNOSIS — I130 Hypertensive heart and chronic kidney disease with heart failure and stage 1 through stage 4 chronic kidney disease, or unspecified chronic kidney disease: Secondary | ICD-10-CM | POA: Insufficient documentation

## 2020-03-02 DIAGNOSIS — Y732 Prosthetic and other implants, materials and accessory gastroenterology and urology devices associated with adverse incidents: Secondary | ICD-10-CM | POA: Diagnosis not present

## 2020-03-02 DIAGNOSIS — Z794 Long term (current) use of insulin: Secondary | ICD-10-CM | POA: Diagnosis not present

## 2020-03-02 DIAGNOSIS — N390 Urinary tract infection, site not specified: Secondary | ICD-10-CM | POA: Insufficient documentation

## 2020-03-02 DIAGNOSIS — I5030 Unspecified diastolic (congestive) heart failure: Secondary | ICD-10-CM | POA: Diagnosis not present

## 2020-03-02 DIAGNOSIS — T83098A Other mechanical complication of other indwelling urethral catheter, initial encounter: Secondary | ICD-10-CM | POA: Diagnosis not present

## 2020-03-02 DIAGNOSIS — N189 Chronic kidney disease, unspecified: Secondary | ICD-10-CM | POA: Diagnosis not present

## 2020-03-02 DIAGNOSIS — Z466 Encounter for fitting and adjustment of urinary device: Secondary | ICD-10-CM | POA: Diagnosis present

## 2020-03-02 DIAGNOSIS — Z79899 Other long term (current) drug therapy: Secondary | ICD-10-CM | POA: Diagnosis not present

## 2020-03-02 DIAGNOSIS — Z7902 Long term (current) use of antithrombotics/antiplatelets: Secondary | ICD-10-CM | POA: Insufficient documentation

## 2020-03-02 DIAGNOSIS — F0390 Unspecified dementia without behavioral disturbance: Secondary | ICD-10-CM | POA: Diagnosis not present

## 2020-03-02 DIAGNOSIS — F03C0 Unspecified dementia, severe, without behavioral disturbance, psychotic disturbance, mood disturbance, and anxiety: Secondary | ICD-10-CM

## 2020-03-02 DIAGNOSIS — T839XXA Unspecified complication of genitourinary prosthetic device, implant and graft, initial encounter: Secondary | ICD-10-CM

## 2020-03-02 NOTE — ED Triage Notes (Signed)
Pt arrives via [facility] with c/o foley catheter leakage. Pt c/o no other concerns. MD Scotty Court at bedside.

## 2020-03-02 NOTE — ED Provider Notes (Addendum)
[facility] Emergency Department Provider Note  ____________________________________________  Time seen: Approximately 11:56 PM  I have reviewed the triage vital signs and the nursing notes.   HISTORY  Chief Complaint Foley insertion    Level 5 Caveat: Portions of the History and Physical including HPI and review of systems are unable to be completely obtained due to patient being a poor historian due to dementia  HPI Pamela [name] is a 86 y.o. female with a history of hypertension, diabetes, atrial fibrillation, CHF, CKD who was sent to the ED due to Foley catheter not draining, and urine leaking around her into her brief. Pamela [name] reports that the patient has no acute symptoms and they are sending to the patient to the ED only because of Foley catheter problem, and when they contacted their home health agency they were informed that because of Easter the Foley catheter could not be replaced for 2 days.   Patient denies any pain or other complaints.     Past Medical History:  Diagnosis Date  . A-fib ([place]) 08/28/2014   Overview:  Overview:  Diastolic dysfunction on ECHO 04/2012-done at [facility]   . Abnormal gait 02/08/2012  . Abscess or cellulitis of leg 04/18/2012  . Absence of bladder continence 07/29/2012  . Acid reflux 02/08/2012  . Acute kidney failure ([place]) 05/15/2012  . Afib, paroxysmal, with RVR at times with BBB 05/13/2012  . Allergic drug rash 05/15/2012  . Anemia 05/13/2012  . 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 Benign essential HTN 02/08/2012  . Bladder infection, chronic 06/29/2013  . BP (high blood pressure) 05/12/2012  . Bursitis of knee 08/07/2014  . Cellulitis in diabetic foot ([place]) 05/12/2012  . CHF (congestive heart failure) ([place])   . Chronic kidney disease 06/01/2012   Overview:  Overview:   12/2012- Renal US, simple L cyst. UIEP normal   . Clinical depression 08/28/2014  . CN (constipation) 03/17/2012  . Dermatitis due to drug reaction  05/15/2012  . Diabetes mellitus   . Diastolic dysfunction, left ventricle 05/14/2012  . DM (diabetes mellitus) ([place]) 05/12/2012  . Dysrhythmia   . Dysuria   . Edema leg 07/02/2014  . Enthesopathy of knee 08/07/2014  . Excess fluid volume 05/20/2012  . FOM (frequency of micturition) 07/29/2012  . Frank hematuria 09/27/2014  . Gangrene ([place])   . GERD (gastroesophageal reflux disease)   . Heart disease   . High cholesterol   . HTN (hypertension) 05/12/2012  . Hypercholesterolemia 05/12/2012  . Hypertension   . Incomplete bladder emptying 07/29/2012  . Left bundle branch block (LBBB)   . Open wound of knee, leg (except thigh), and ankle 06/01/2012  . Stevens-Johnson syndrome ([place]) 05/15/2012   Overview:  Overview:  Several years ago, allergic reaction to sulfa antibiotic Several years ago, allergic reaction to sulfa antibiotic      Patient Active Problem List   Diagnosis Date Noted  . NSTEMI (non-ST elevated myocardial infarction) ([place]) 11/28/2018  . UTI (urinary tract infection) 11/28/2017  . Pelvic muscle wasting 01/26/2017  . Cystocele, midline 01/26/2017  . Urinary frequency 12/26/2015  . Incontinence 12/07/2015  . Cystocele 12/07/2015  . Tinea cruris 12/07/2015  . Recurrent UTI 08/11/2015  . Vaginal atrophy 08/11/2015  . 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 Discoordination 05/08/2015  . Frank hematuria 09/27/2014  . Clinical depression 08/28/2014  . Major depressive disorder with single episode 08/28/2014  . Bursitis of knee 08/07/2014  . Enthesopathy of knee 08/07/2014  . Edema leg 07/02/2014  .  Bladder infection, chronic 06/29/2013  . Incomplete bladder emptying 07/29/2012  . FOM (frequency of micturition) 07/29/2012  . Absence of bladder continence 07/29/2012  . Chronic kidney disease 06/01/2012  . Open wound of knee, leg (except thigh), and ankle 06/01/2012  . Skin rash 05/20/2012  . Acute kidney failure ([place]) 05/15/2012  . Allergic drug rash 05/15/2012  . Stevens-Johnson  syndrome ([place]) 05/15/2012  . Dermatitis due to drug taken internally 05/15/2012  . Dermatitis due to drug reaction 05/15/2012  . Acute renal failure ([place]) 05/15/2012  . Diastolic dysfunction, left ventricle 05/14/2012  . PAF (paroxysmal atrial fibrillation) ([place]) 05/13/2012  . Anemia 05/13/2012  . Cellulitis in diabetic foot ([place]) 05/12/2012  . HTN (hypertension) 05/12/2012  . Hypercholesterolemia 05/12/2012  . Abscess or cellulitis of leg 04/18/2012  . Diabetes mellitus ([place]) 04/18/2012  . CN (constipation) 03/17/2012  . Abnormal gait 02/08/2012  . Benign essential HTN 02/08/2012  . GERD (gastroesophageal reflux disease) 02/08/2012     Past Surgical History:  Procedure Laterality Date  . ABDOMINAL HYSTERECTOMY    . BLADDER SURGERY    . cataract surgery    . ENDOVENOUS ABLATION SAPHENOUS VEIN W/ LASER    . LEFT HEART CATH AND CORONARY ANGIOGRAPHY N/A 11/29/2018   Procedure: LEFT HEART CATH AND CORONARY ANGIOGRAPHY;  Surgeon: Corey Skains, MD;  Location: [facility];  Service: Cardiovascular;  Laterality: N/A;  . NASAL SINUS SURGERY    . TOE AMPUTATION    . TONSILLECTOMY       Prior to Admission medications   Medication Sig Start Date End Date Taking? Authorizing Provider  amiodarone (PACERONE) 200 MG tablet Take 200 mg by mouth daily.  12/16/15   [provider]  amitriptyline (ELAVIL) 50 MG tablet Take 50 mg by mouth at bedtime.  03/31/16   [provider]  amLODipine (NORVASC) 5 MG tablet Take 5 mg by mouth daily.    [provider]  atorvastatin (LIPITOR) 40 MG tablet Take 40 mg by mouth daily.    [provider]  bisacodyl (BISACODYL) 5 MG EC tablet Take 1 tablet (5 mg total) by mouth daily as needed for moderate constipation. 11/30/18   Demetrios Loll, MD  clopidogrel (PLAVIX) 75 MG tablet Take 1 tablet (75 mg total) by mouth daily. 11/30/18   Demetrios Loll, MD  doxycycline (VIBRA-TABS) 100 MG tablet Take 1 tablet (100 mg total) by  mouth 2 (two) times daily. 02/24/20   Jearld Fenton, NP  Glucagon, rDNA, (GLUCAGON EMERGENCY) 1 MG KIT  11/21/19   [provider]  hydrocortisone 1 % ointment Apply 1 application topically 2 (two) times daily. Please apply liberally to your vulva twice a day to help prevent further irritation 12/06/18   Darel Hong, MD  insulin lispro protamine-lispro (HUMALOG 75/25 MIX) (75-25) 100 UNIT/ML SUSP injection Inject 20 Units into the skin daily.    [provider]  insulin lispro protamine-lispro (HUMALOG 75/25 MIX) (75-25) 100 UNIT/ML SUSP injection Inject 18 Units into the skin at bedtime.    [provider]  memantine (NAMENDA) 10 MG tablet Take 10 mg by mouth 2 (two) times daily. 01/30/20   [provider]  metoprolol tartrate (LOPRESSOR) 25 MG tablet Take 25 mg by mouth 2 (two) times daily.    [provider]  nitroGLYCERIN (NITROSTAT) 0.4 MG SL tablet Place 1 tablet (0.4 mg total) under the tongue every 5 (five) minutes as needed for chest pain. 11/30/18   Demetrios Loll, MD  NOVOLOG MIX 70/30 FLEXPEN (70-30) 100 UNIT/ML FlexPen Inject 0.12 mLs (12 Units total) into the skin 2 (two) times daily with a meal. 11/30/17   Bettey Costa, MD  Olopatadine HCl 0.2 % SOLN  02/06/20   [provider]  pantoprazole (PROTONIX) 40 MG tablet Take 40 mg by mouth daily.    [provider]  torsemide (DEMADEX) 20 MG tablet Take 20 mg by mouth daily. 08/17/19   [provider]  traMADol (ULTRAM) 50 MG tablet Take 50 mg by mouth 2 (two) times daily as needed. 11/07/19   [provider]  vitamin B-12 (CYANOCOBALAMIN) 1000 MCG tablet Take 1,000 mcg by mouth daily.    [provider]     Allergies Ciprofloxacin, Sulfamethoxazole-trimethoprim, Augmentin [amoxicillin-pot clavulanate], Hydrochlorothiazide, Levofloxacin, Losartan potassium-hctz, Nitrofurantoin, Sulfa antibiotics, Sulfasalazine, and Levaquin [levofloxacin in d5w]   Family  History  Problem Relation Age of Onset  . Diabetes Mellitus II Mother   . Heart disease Mother   . Lung cancer Father   . Diabetes Paternal Grandmother   . Diabetes Maternal Grandmother   . Kidney disease Neg Hx   . Prostate cancer Neg Hx   . Bladder Cancer Neg Hx     Social History Social History   Tobacco Use  . Smoking status: Never Smoker  . Smokeless tobacco: Never Used  Substance Use Topics  . Alcohol use: No    Alcohol/week: 0.0 standard drinks  . Drug use: No    Review of Systems Level 5 Caveat: Portions of the History and Physical including HPI and review of systems are unable to be completely obtained due to patient being a poor historian   Constitutional:   No known fever.  ENT:   No rhinorrhea. Cardiovascular:   No chest pain or syncope. Respiratory:   No dyspnea or cough. Gastrointestinal:   Negative for abdominal pain, vomiting and diarrhea.  Musculoskeletal:   Negative for focal pain or swelling ____________________________________________   PHYSICAL EXAM:  VITAL SIGNS: ED Triage Vitals [03/02/20 2354]  Enc Vitals Group     BP      Pulse      Resp      Temp      Temp src      SpO2      Weight 190 lb 0.6 oz (86.2 kg)     Height _0  (1.651 m)     Head Circumference      Peak Flow      Pain Score      Pain Loc      Pain Edu?      Excl. in [place]?     Vital signs reviewed, nursing assessments reviewed.   Constitutional:   Awake and alert, not oriented. Non-toxic appearance. Eyes:   Conjunctivae are normal. EOMI. PERRL. ENT      Head:   Normocephalic and atraumatic.      Nose:   No congestion/rhinnorhea.       Mouth/Throat:   MMM, no pharyngeal erythema. No peritonsillar mass.       Neck:   No meningismus. Full ROM. Hematological/Lymphatic/Immunilogical:   No cervical lymphadenopathy. Cardiovascular:   RRR.  Cap refill less than 2 seconds. Respiratory:   Normal respiratory effort without tachypnea/retractions. Breath sounds are clear and  equal bilaterally. No wheezes/rales/rhonchi. Gastrointestinal:   Soft and nontender. Non distended. There is no CVA tenderness.  No rebound, rigidity, or guarding. Genitourinary:   Normal external exam. Foley catheter tubing is filthy, heavily colonized with thick  accumulation of debris Musculoskeletal:   Normal range of motion in all extremities. No joint effusions.  No lower extremity tenderness.  No edema. Neurologic:   Normal speech and language.  Motor grossly intact. No acute focal neurologic deficits are appreciated.  Skin:    Skin is warm, dry and intact. No rash noted.  No petechiae, purpura, or bullae.  ____________________________________________    LABS (pertinent positives/negatives) (all labs ordered are listed, but only abnormal results are displayed) Labs Reviewed  URINE CULTURE  URINALYSIS, COMPLETE (UACMP) WITH MICROSCOPIC   ____________________________________________   EKG    ____________________________________________    RADIOLOGY  No results found.  ____________________________________________   PROCEDURES Procedures  ____________________________________________    CLINICAL IMPRESSION / ASSESSMENT AND PLAN / ED COURSE  Medications ordered in the ED: Medications - No data to display  Pertinent labs & imaging results that were available during my care of the patient were reviewed by me and considered in my medical decision making (see chart for details).   Pamela [name] was evaluated in Emergency Department on 03/02/2020 for the symptoms described in the history of present illness. She was evaluated in the context of the global COVID-19 pandemic, which necessitated consideration that the patient might be at risk for infection with the SARS-CoV-2 virus that causes COVID-19. Institutional protocols and algorithms that pertain to the evaluation of patients at risk for COVID-19 are in a state of rapid change based on information released by  regulatory bodies including the CDC and federal and state organizations. These policies and algorithms were followed during the patient's care in the ED.   Patient sent to ED for Foley catheter replacement without acute symptoms. Foley catheter is clearly contaminated. We will replace it and send for urinalysis and urine culture. Patient appears to be at her baseline, without acute complaints or findings, vital signs unremarkable. She can be discharged [facility] with urinalysis results still pending, to be followed up by her clinical team there..  ----------------------------------------- 12:55 AM on 03/03/2020 -----------------------------------------  Patient has been discharged already.  Urinalysis shows a clear UTI.  Culture pending.  Cefdinir prescription sent electronically to her pharmacy.  Charge nurse will call Pamela [name] to inform them of the prescription.     ____________________________________________   FINAL CLINICAL IMPRESSION(S) / ED DIAGNOSES    Final diagnoses:  Foley catheter problem, initial encounter ([place])  Advanced dementia [facility])     ED Discharge Orders    None      Portions of this note were generated with dragon dictation software. Dictation errors may occur despite best attempts at proofreading.   Carrie Mew, MD 03/03/20 0000    Carrie Mew, MD 03/03/20 [phone]

## 2020-03-03 LAB — URINALYSIS, COMPLETE (UACMP) WITH MICROSCOPIC
Bilirubin Urine: NEGATIVE
Glucose, UA: NEGATIVE mg/dL
Ketones, ur: NEGATIVE mg/dL
Nitrite: POSITIVE — AB
Protein, ur: 30 mg/dL — AB
RBC / HPF: >50 RBC/hpf — ABNORMAL HIGH (ref 0–5)
Specific Gravity, Urine: 1.009 (ref 1.005–1.030)
WBC, UA: >50 WBC/hpf — ABNORMAL HIGH (ref 0–5)
pH: 6 (ref 5.0–8.0)

## 2020-03-03 MED ORDER — CEFDINIR 300 MG PO CAPS: 300.0000 mg | ORAL_CAPSULE | Freq: Two times a day (BID) | ORAL | 0 refills | Status: DC

## 2020-03-03 NOTE — Discharge Instructions (Addendum)
We replaced your foley catheter today in the ED. Due to the thick debris in the tubing, we have sent a clean urine sample to the lab, and we will notify your facility if the results are concerning and warrant antibiotics.

## 2020-03-05 LAB — URINE CULTURE: Culture: >=100000 — AB

## 2020-04-12 ENCOUNTER — Emergency Department
Admission: EM | Admit: 2020-04-12 | Discharge: 2020-04-13 | Disposition: A | Discharge status: Home / Self Care | Payer: Medicare Other | Attending: Emergency Medicine | Admitting: Emergency Medicine

## 2020-04-12 ENCOUNTER — Other Ambulatory Visit: Payer: Self-pay

## 2020-04-12 DIAGNOSIS — E1122 Type 2 diabetes mellitus with diabetic chronic kidney disease: Secondary | ICD-10-CM | POA: Diagnosis not present

## 2020-04-12 DIAGNOSIS — I252 Old myocardial infarction: Secondary | ICD-10-CM | POA: Insufficient documentation

## 2020-04-12 DIAGNOSIS — Z79899 Other long term (current) drug therapy: Secondary | ICD-10-CM | POA: Diagnosis not present

## 2020-04-12 DIAGNOSIS — T83021A Displacement of indwelling urethral catheter, initial encounter: Secondary | ICD-10-CM | POA: Diagnosis not present

## 2020-04-12 DIAGNOSIS — Z794 Long term (current) use of insulin: Secondary | ICD-10-CM | POA: Diagnosis not present

## 2020-04-12 DIAGNOSIS — N189 Chronic kidney disease, unspecified: Secondary | ICD-10-CM | POA: Insufficient documentation

## 2020-04-12 DIAGNOSIS — Z960 Presence of urogenital implants: Secondary | ICD-10-CM | POA: Diagnosis not present

## 2020-04-12 DIAGNOSIS — I509 Heart failure, unspecified: Secondary | ICD-10-CM | POA: Insufficient documentation

## 2020-04-12 DIAGNOSIS — I130 Hypertensive heart and chronic kidney disease with heart failure and stage 1 through stage 4 chronic kidney disease, or unspecified chronic kidney disease: Secondary | ICD-10-CM | POA: Insufficient documentation

## 2020-04-12 LAB — URINALYSIS, COMPLETE (UACMP) WITH MICROSCOPIC
Bilirubin Urine: NEGATIVE
Glucose, UA: NEGATIVE mg/dL
Ketones, ur: NEGATIVE mg/dL
Nitrite: NEGATIVE
Protein, ur: 100 mg/dL — AB
RBC / HPF: >50 RBC/hpf — ABNORMAL HIGH (ref 0–5)
Specific Gravity, Urine: 1.017 (ref 1.005–1.030)
WBC, UA: >50 WBC/hpf — ABNORMAL HIGH (ref 0–5)
pH: 5 (ref 5.0–8.0)

## 2020-04-12 MED ORDER — CEFTRIAXONE SODIUM 1 G IJ SOLR
1.0000 g | Freq: Once | INTRAMUSCULAR | Status: AC
  Administered 2020-04-12: 1 g via INTRAMUSCULAR

## 2020-04-12 MED ORDER — CEPHALEXIN 500 MG PO CAPS: 500.0000 mg | ORAL_CAPSULE | Freq: Two times a day (BID) | ORAL | 0 refills | Status: DC

## 2020-04-12 NOTE — Discharge Instructions (Addendum)
Ms. Pamela [name] catheter was replaced.  Given the appearance of the urine, we gave her an injection of ceftriaxone 1 g and I wrote her a prescription for antibiotics.  Please have her take the full course of antibiotics prescribed.  Follow up with her regular doctor at the next available opportunity.

## 2020-04-12 NOTE — ED Triage Notes (Signed)
Patient coming [facility] from [facility] to have urinary catheter replaced. Catheter came dislodged at facility. Patient denies pain/other issues.  MD York Cerise at bedside.

## 2020-04-12 NOTE — ED Notes (Signed)
ED Provider at bedside. 

## 2020-04-12 NOTE — ED Provider Notes (Signed)
[facility] Emergency Department Provider Note  ____________________________________________   First MD Initiated Contact with Patient 04/12/20 2330     (approximate)  I have reviewed the triage vital signs and the nursing notes.   HISTORY  Chief Complaint No chief complaint on file.  Level 5 caveat:  history/ROS limited by chronic dementia and being a vague historian.  HPI HAEVYN URY is a 86 y.o. female with extensive medical history as listed below and a history of repeated issues with her indwelling Foley catheter that she has had for at least 9 years.  She presents tonight by EMS from her nursing facility after her Foley catheter was somehow dislodged while she was transferring to her from her bed.  She has no other complaints.  She denies pain and has had no recent fevers according to EMS report.  The patient says nothing is wrong.  The Foley catheter is currently not working and is leaking around the catheter site from the urethra.  There is no visible blood.         Past Medical History:  Diagnosis Date  . A-fib ([place]) 08/28/2014   Overview:  Overview:  Diastolic dysfunction on ECHO 04/2012-done at [facility]   . Abnormal gait 02/08/2012  . Abscess or cellulitis of leg 04/18/2012  . Absence of bladder continence 07/29/2012  . Acid reflux 02/08/2012  . Acute kidney failure ([place]) 05/15/2012  . Afib, paroxysmal, with RVR at times with BBB 05/13/2012  . Allergic drug rash 05/15/2012  . Anemia 05/13/2012  . 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 Benign essential HTN 02/08/2012  . Bladder infection, chronic 06/29/2013  . BP (high blood pressure) 05/12/2012  . Bursitis of knee 08/07/2014  . Cellulitis in diabetic foot ([place]) 05/12/2012  . CHF (congestive heart failure) ([place])   . Chronic kidney disease 06/01/2012   Overview:  Overview:   12/2012- Renal US, simple L cyst. UIEP normal   . Clinical depression 08/28/2014  . CN (constipation) 03/17/2012  . Dermatitis due to  drug reaction 05/15/2012  . Diabetes mellitus   . Diastolic dysfunction, left ventricle 05/14/2012  . DM (diabetes mellitus) ([place]) 05/12/2012  . Dysrhythmia   . Dysuria   . Edema leg 07/02/2014  . Enthesopathy of knee 08/07/2014  . Excess fluid volume 05/20/2012  . FOM (frequency of micturition) 07/29/2012  . Frank hematuria 09/27/2014  . Gangrene ([place])   . GERD (gastroesophageal reflux disease)   . Heart disease   . High cholesterol   . HTN (hypertension) 05/12/2012  . Hypercholesterolemia 05/12/2012  . Hypertension   . Incomplete bladder emptying 07/29/2012  . Left bundle branch block (LBBB)   . Open wound of knee, leg (except thigh), and ankle 06/01/2012  . Stevens-Johnson syndrome ([place]) 05/15/2012   Overview:  Overview:  Several years ago, allergic reaction to sulfa antibiotic Several years ago, allergic reaction to sulfa antibiotic     Patient Active Problem List   Diagnosis Date Noted  . NSTEMI (non-ST elevated myocardial infarction) ([place]) 11/28/2018  . UTI (urinary tract infection) 11/28/2017  . Pelvic muscle wasting 01/26/2017  . Cystocele, midline 01/26/2017  . Urinary frequency 12/26/2015  . Incontinence 12/07/2015  . Cystocele 12/07/2015  . Tinea cruris 12/07/2015  . Recurrent UTI 08/11/2015  . Vaginal atrophy 08/11/2015  . 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 Discoordination 05/08/2015  . Frank hematuria 09/27/2014  . Clinical depression 08/28/2014  . Major depressive disorder with single episode 08/28/2014  . Bursitis of knee 08/07/2014  .  Enthesopathy of knee 08/07/2014  . Edema leg 07/02/2014  . Bladder infection, chronic 06/29/2013  . Incomplete bladder emptying 07/29/2012  . FOM (frequency of micturition) 07/29/2012  . Absence of bladder continence 07/29/2012  . Chronic kidney disease 06/01/2012  . Open wound of knee, leg (except thigh), and ankle 06/01/2012  . Skin rash 05/20/2012  . Acute kidney failure ([place]) 05/15/2012  . Allergic drug rash 05/15/2012  .  Stevens-Johnson syndrome ([place]) 05/15/2012  . Dermatitis due to drug taken internally 05/15/2012  . Dermatitis due to drug reaction 05/15/2012  . Acute renal failure ([place]) 05/15/2012  . Diastolic dysfunction, left ventricle 05/14/2012  . PAF (paroxysmal atrial fibrillation) ([place]) 05/13/2012  . Anemia 05/13/2012  . Cellulitis in diabetic foot ([place]) 05/12/2012  . HTN (hypertension) 05/12/2012  . Hypercholesterolemia 05/12/2012  . Abscess or cellulitis of leg 04/18/2012  . Diabetes mellitus ([place]) 04/18/2012  . CN (constipation) 03/17/2012  . Abnormal gait 02/08/2012  . Benign essential HTN 02/08/2012  . GERD (gastroesophageal reflux disease) 02/08/2012    Past Surgical History:  Procedure Laterality Date  . ABDOMINAL HYSTERECTOMY    . BLADDER SURGERY    . cataract surgery    . ENDOVENOUS ABLATION SAPHENOUS VEIN W/ LASER    . LEFT HEART CATH AND CORONARY ANGIOGRAPHY N/A 11/29/2018   Procedure: LEFT HEART CATH AND CORONARY ANGIOGRAPHY;  Surgeon: Corey Skains, MD;  Location: [place] [facility];  Service: Cardiovascular;  Laterality: N/A;  . NASAL SINUS SURGERY    . TOE AMPUTATION    . TONSILLECTOMY      Prior to Admission medications   Medication Sig Start Date End Date Taking? Authorizing Provider  amiodarone (PACERONE) 200 MG tablet Take 200 mg by mouth daily.  12/16/15   [provider]  amitriptyline (ELAVIL) 50 MG tablet Take 50 mg by mouth at bedtime.  03/31/16   [provider]  amLODipine (NORVASC) 5 MG tablet Take 5 mg by mouth daily.    [provider]  atorvastatin (LIPITOR) 40 MG tablet Take 40 mg by mouth daily.    [provider]  bisacodyl (BISACODYL) 5 MG EC tablet Take 1 tablet (5 mg total) by mouth daily as needed for moderate constipation. 11/30/18   Demetrios Loll, MD  cefdinir (OMNICEF) 300 MG capsule Take 1 capsule (300 mg total) by mouth 2 (two) times daily. 03/03/20   Carrie Mew, MD  cephALEXin (KEFLEX) 500 MG capsule Take  1 capsule (500 mg total) by mouth 2 (two) times daily. 04/12/20   Hinda Kehr, MD  clopidogrel (PLAVIX) 75 MG tablet Take 1 tablet (75 mg total) by mouth daily. 11/30/18   Demetrios Loll, MD  doxycycline (VIBRA-TABS) 100 MG tablet Take 1 tablet (100 mg total) by mouth 2 (two) times daily. 02/24/20   Jearld Fenton, NP  Glucagon, rDNA, (GLUCAGON EMERGENCY) 1 MG KIT  11/21/19   [provider]  hydrocortisone 1 % ointment Apply 1 application topically 2 (two) times daily. Please apply liberally to your vulva twice a day to help prevent further irritation 12/06/18   Darel Hong, MD  insulin lispro protamine-lispro (HUMALOG 75/25 MIX) (75-25) 100 UNIT/ML SUSP injection Inject 20 Units into the skin daily.    [provider]  insulin lispro protamine-lispro (HUMALOG 75/25 MIX) (75-25) 100 UNIT/ML SUSP injection Inject 18 Units into the skin at bedtime.    [provider]  memantine (NAMENDA) 10 MG tablet Take 10 mg by mouth 2 (two) times daily. 01/30/20  [provider]  metoprolol tartrate (LOPRESSOR) 25 MG tablet Take 25 mg by mouth 2 (two) times daily.    [provider]  nitroGLYCERIN (NITROSTAT) 0.4 MG SL tablet Place 1 tablet (0.4 mg total) under the tongue every 5 (five) minutes as needed for chest pain. 11/30/18   Demetrios Loll, MD  NOVOLOG MIX 70/30 FLEXPEN (70-30) 100 UNIT/ML FlexPen Inject 0.12 mLs (12 Units total) into the skin 2 (two) times daily with a meal. 11/30/17   Bettey Costa, MD  Olopatadine HCl 0.2 % SOLN  02/06/20   [provider]  pantoprazole (PROTONIX) 40 MG tablet Take 40 mg by mouth daily.    [provider]  torsemide (DEMADEX) 20 MG tablet Take 20 mg by mouth daily. 08/17/19   [provider]  traMADol (ULTRAM) 50 MG tablet Take 50 mg by mouth 2 (two) times daily as needed. 11/07/19   [provider]  vitamin B-12 (CYANOCOBALAMIN) 1000 MCG tablet Take 1,000 mcg by mouth daily.    [provider]     Allergies Ciprofloxacin, Sulfamethoxazole-trimethoprim, Augmentin [amoxicillin-pot clavulanate], Hydrochlorothiazide, Levofloxacin, Losartan potassium-hctz, Nitrofurantoin, Sulfa antibiotics, Sulfasalazine, and Levaquin [levofloxacin in d5w]  Family History  Problem Relation Age of Onset  . Diabetes Mellitus II Mother   . Heart disease Mother   . Lung cancer Father   . Diabetes Paternal Grandmother   . Diabetes Maternal Grandmother   . Kidney disease Neg Hx   . Prostate cancer Neg Hx   . Bladder Cancer Neg Hx     Social History Social History   Tobacco Use  . Smoking status: Never Smoker  . Smokeless tobacco: Never Used  Substance Use Topics  . Alcohol use: No    Alcohol/week: 0.0 standard drinks  . Drug use: No    Review of Systems Level 5 caveat:  history/ROS limited by chronic dementia and being a vague historian.    ____________________________________________   PHYSICAL EXAM:  VITAL SIGNS: ED Triage Vitals  Enc Vitals Group     BP 04/12/20 2330 (!) 142/71     Pulse Rate 04/12/20 2330 60     Resp 04/12/20 2330 18     Temp 04/12/20 2330 98 F (36.7 C)     Temp Source 04/12/20 2330 Oral     SpO2 04/12/20 2330 98 %     Weight 04/12/20 2328 86.2 kg (190 lb 0.6 oz)     Height 04/12/20 2328 1.651 m (5' 5")     Head Circumference --      Peak Flow --      Pain Score --      Pain Loc --      Pain Edu? --      Excl. in [place]? --     Constitutional: Alert and in no distress. Eyes: Conjunctivae are normal.  Head: Atraumatic. Mouth/Throat: Patient is wearing a mask. Neck: No stridor.  No meningeal signs.   Cardiovascular: Normal rate, regular rhythm. Good peripheral circulation. Respiratory: Normal respiratory effort.  No retractions. Gastrointestinal: Soft and nontender. No distention.  Genitourinary: No gross abnormalities with indwelling Foley catheter but with urine leakage around the outside of catheter.  The Foley appears contaminated with heavy  sediment present in the tube.  After replacement of the Foley catheter, the urine that is draining is thick, with heavy sediment, whitish-yellow, no gross hematuria. Musculoskeletal: No lower extremity tenderness nor edema. No gross deformities of extremities. Neurologic:  Normal speech and language. No gross focal neurologic deficits are  appreciated.  Skin:  Skin is warm, dry and intact.  ____________________________________________   LABS (all labs ordered are listed, but only abnormal results are displayed)  Labs Reviewed  URINE CULTURE  URINALYSIS, COMPLETE (UACMP) WITH MICROSCOPIC   ____________________________________________  EKG  No indication for emergent EKG ____________________________________________  RADIOLOGY I, Hinda Kehr, personally viewed and evaluated these images (plain radiographs) as part of my medical decision making, as well as reviewing the written report by the radiologist.  ED MD interpretation: No indication for emergent imaging  Official radiology report(s): No results found.  ____________________________________________   PROCEDURES   Procedure(s) performed (including Critical Care):  Procedures   ____________________________________________   INITIAL IMPRESSION / MDM / [place] / ED COURSE  As part of my medical decision making, I reviewed the following data within the [place] notes reviewed and incorporated, Old chart reviewed and Notes from prior ED visits   Differential diagnosis includes, but is not limited to, Foley catheter displacement, chronically infected urine, acute UTI.  Patient is in no distress with stable vital signs.  Foley catheter was replaced without difficulty.  I understand that she has had a Foley catheter for 9 years and that contamination is to be expected, but a month ago she had a urine culture that tested positive for Proteus and she was treated empirically from the  emergency department.  The catheter appears contaminated again and her urine is grossly abnormal in appearance.  Given the morbidity and mortality associated with a missed UTI that could lead to sepsis, I will treat based on the resistance pattern from the last urine culture.  I have sent a urinalysis and urine culture but there is no need to wait for the results to come back.  The patient is in no pain.  I am treating with ceftriaxone 1 g intramuscular and will treat for 7 days with Keflex 500 mg twice daily.  I provided my usual and customary written return precautions.   ____________________________________________  FINAL CLINICAL IMPRESSION(S) / ED DIAGNOSES  Final diagnoses:  Displacement of Foley catheter, initial encounter ([place])     MEDICATIONS GIVEN DURING THIS VISIT:  Medications  cefTRIAXone (ROCEPHIN) injection 1 g (1 g Intramuscular Given 04/12/20 2342)     ED Discharge Orders         Ordered    cephALEXin (KEFLEX) 500 MG capsule  2 times daily     04/12/20 2339          *Please note:  ALISA STJAMES was evaluated in Emergency Department on 04/12/2020 for the symptoms described in the history of present illness. She was evaluated in the context of the global COVID-19 pandemic, which necessitated consideration that the patient might be at risk for infection with the SARS-CoV-2 virus that causes COVID-19. Institutional protocols and algorithms that pertain to the evaluation of patients at risk for COVID-19 are in a state of rapid change based on information released by regulatory bodies including the CDC and federal and state organizations. These policies and algorithms were followed during the patient's care in the ED.  Some ED evaluations and interventions may be delayed as a result of limited staffing during the pandemic.*  Note:  This document was prepared using Dragon voice recognition software and may include unintentional dictation errors.   Hinda Kehr,  MD 04/12/20 2350

## 2020-04-13 NOTE — ED Notes (Signed)
Reviewed discharge instructions, follow-up care, and prescriptions with patient. Patient verbalized understanding of all information reviewed. Patient stable, with no distress noted at this time.    

## 2020-04-15 LAB — URINE CULTURE
Culture: >=100000 — AB
Special Requests: NORMAL

## 2020-05-02 ENCOUNTER — Other Ambulatory Visit: Payer: Self-pay

## 2020-05-02 ENCOUNTER — Emergency Department
Admission: EM | Admit: 2020-05-02 | Discharge: 2020-05-02 | Disposition: A | Discharge status: Home / Self Care | Payer: Medicare Other | Attending: Emergency Medicine | Admitting: Emergency Medicine

## 2020-05-02 DIAGNOSIS — E1122 Type 2 diabetes mellitus with diabetic chronic kidney disease: Secondary | ICD-10-CM | POA: Insufficient documentation

## 2020-05-02 DIAGNOSIS — Z794 Long term (current) use of insulin: Secondary | ICD-10-CM | POA: Insufficient documentation

## 2020-05-02 DIAGNOSIS — I509 Heart failure, unspecified: Secondary | ICD-10-CM | POA: Insufficient documentation

## 2020-05-02 DIAGNOSIS — N189 Chronic kidney disease, unspecified: Secondary | ICD-10-CM | POA: Insufficient documentation

## 2020-05-02 DIAGNOSIS — I480 Paroxysmal atrial fibrillation: Secondary | ICD-10-CM | POA: Insufficient documentation

## 2020-05-02 DIAGNOSIS — T83031A Leakage of indwelling urethral catheter, initial encounter: Secondary | ICD-10-CM | POA: Diagnosis not present

## 2020-05-02 DIAGNOSIS — Z79899 Other long term (current) drug therapy: Secondary | ICD-10-CM | POA: Insufficient documentation

## 2020-05-02 DIAGNOSIS — Z7902 Long term (current) use of antithrombotics/antiplatelets: Secondary | ICD-10-CM | POA: Insufficient documentation

## 2020-05-02 DIAGNOSIS — Z466 Encounter for fitting and adjustment of urinary device: Secondary | ICD-10-CM

## 2020-05-02 DIAGNOSIS — I130 Hypertensive heart and chronic kidney disease with heart failure and stage 1 through stage 4 chronic kidney disease, or unspecified chronic kidney disease: Secondary | ICD-10-CM | POA: Diagnosis not present

## 2020-05-02 NOTE — ED Provider Notes (Signed)
[facility] Emergency Department Provider Note   ____________________________________________   First MD Initiated Contact with Patient 05/02/20 [phone]     (approximate)  I have reviewed the triage vital signs and the nursing notes.   HISTORY  Chief Complaint Foley catheter replacement   HPI Pamela [name] is a 86 y.o. female presents to the ED via EMS from [place] with report of her catheter leaking.       Past Medical History:  Diagnosis Date  . A-fib ([place]) 08/28/2014   Overview:  Overview:  Diastolic dysfunction on ECHO 04/2012-done at [facility]   . Abnormal gait 02/08/2012  . Abscess or cellulitis of leg 04/18/2012  . Absence of bladder continence 07/29/2012  . Acid reflux 02/08/2012  . Acute kidney failure ([place]) 05/15/2012  . Afib, paroxysmal, with RVR at times with BBB 05/13/2012  . Allergic drug rash 05/15/2012  . Anemia 05/13/2012  . 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 Benign essential HTN 02/08/2012  . Bladder infection, chronic 06/29/2013  . BP (high blood pressure) 05/12/2012  . Bursitis of knee 08/07/2014  . Cellulitis in diabetic foot ([place]) 05/12/2012  . CHF (congestive heart failure) ([place])   . Chronic kidney disease 06/01/2012   Overview:  Overview:   12/2012- Renal US, simple L cyst. UIEP normal   . Clinical depression 08/28/2014  . CN (constipation) 03/17/2012  . Dermatitis due to drug reaction 05/15/2012  . Diabetes mellitus   . Diastolic dysfunction, left ventricle 05/14/2012  . DM (diabetes mellitus) ([place]) 05/12/2012  . Dysrhythmia   . Dysuria   . Edema leg 07/02/2014  . Enthesopathy of knee 08/07/2014  . Excess fluid volume 05/20/2012  . FOM (frequency of micturition) 07/29/2012  . Frank hematuria 09/27/2014  . Gangrene ([place])   . GERD (gastroesophageal reflux disease)   . Heart disease   . High cholesterol   . HTN (hypertension) 05/12/2012  . Hypercholesterolemia 05/12/2012  . Hypertension   . Incomplete bladder emptying  07/29/2012  . Left bundle branch block (LBBB)   . Open wound of knee, leg (except thigh), and ankle 06/01/2012  . Stevens-Johnson syndrome ([place]) 05/15/2012   Overview:  Overview:  Several years ago, allergic reaction to sulfa antibiotic Several years ago, allergic reaction to sulfa antibiotic     Patient Active Problem List   Diagnosis Date Noted  . NSTEMI (non-ST elevated myocardial infarction) ([place]) 11/28/2018  . UTI (urinary tract infection) 11/28/2017  . Pelvic muscle wasting 01/26/2017  . Cystocele, midline 01/26/2017  . Urinary frequency 12/26/2015  . Incontinence 12/07/2015  . Cystocele 12/07/2015  . Tinea cruris 12/07/2015  . Recurrent UTI 08/11/2015  . Vaginal atrophy 08/11/2015  . 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 Discoordination 05/08/2015  . Frank hematuria 09/27/2014  . Clinical depression 08/28/2014  . Major depressive disorder with single episode 08/28/2014  . Bursitis of knee 08/07/2014  . Enthesopathy of knee 08/07/2014  . Edema leg 07/02/2014  . Bladder infection, chronic 06/29/2013  . Incomplete bladder emptying 07/29/2012  . FOM (frequency of micturition) 07/29/2012  . Absence of bladder continence 07/29/2012  . Chronic kidney disease 06/01/2012  . Open wound of knee, leg (except thigh), and ankle 06/01/2012  . Skin rash 05/20/2012  . Acute kidney failure ([place]) 05/15/2012  . Allergic drug rash 05/15/2012  . Stevens-Johnson syndrome ([place]) 05/15/2012  . Dermatitis due to drug taken internally 05/15/2012  . Dermatitis due to drug reaction 05/15/2012  . Acute renal failure ([place]) 05/15/2012  . Diastolic  dysfunction, left ventricle 05/14/2012  . PAF (paroxysmal atrial fibrillation) ([place]) 05/13/2012  . Anemia 05/13/2012  . Cellulitis in diabetic foot ([place]) 05/12/2012  . HTN (hypertension) 05/12/2012  . Hypercholesterolemia 05/12/2012  . Abscess or cellulitis of leg 04/18/2012  . Diabetes mellitus ([place]) 04/18/2012  . CN (constipation) 03/17/2012  . Abnormal  gait 02/08/2012  . Benign essential HTN 02/08/2012  . GERD (gastroesophageal reflux disease) 02/08/2012    Past Surgical History:  Procedure Laterality Date  . ABDOMINAL HYSTERECTOMY    . BLADDER SURGERY    . cataract surgery    . ENDOVENOUS ABLATION SAPHENOUS VEIN W/ LASER    . LEFT HEART CATH AND CORONARY ANGIOGRAPHY N/A 11/29/2018   Procedure: LEFT HEART CATH AND CORONARY ANGIOGRAPHY;  Surgeon: Corey Skains, MD;  Location: [place] [facility];  Service: Cardiovascular;  Laterality: N/A;  . NASAL SINUS SURGERY    . TOE AMPUTATION    . TONSILLECTOMY      Prior to Admission medications   Medication Sig Start Date End Date Taking? Authorizing Provider  amiodarone (PACERONE) 200 MG tablet Take 200 mg by mouth daily.  12/16/15   [provider]  amitriptyline (ELAVIL) 50 MG tablet Take 50 mg by mouth at bedtime.  03/31/16   [provider]  amLODipine (NORVASC) 5 MG tablet Take 5 mg by mouth daily.    [provider]  atorvastatin (LIPITOR) 40 MG tablet Take 40 mg by mouth daily.    [provider]  bisacodyl (BISACODYL) 5 MG EC tablet Take 1 tablet (5 mg total) by mouth daily as needed for moderate constipation. 11/30/18   Demetrios Loll, MD  clopidogrel (PLAVIX) 75 MG tablet Take 1 tablet (75 mg total) by mouth daily. 11/30/18   Demetrios Loll, MD  Glucagon, rDNA, (GLUCAGON EMERGENCY) 1 MG KIT  11/21/19   [provider]  hydrocortisone 1 % ointment Apply 1 application topically 2 (two) times daily. Please apply liberally to your vulva twice a day to help prevent further irritation 12/06/18   Darel Hong, MD  insulin lispro protamine-lispro (HUMALOG 75/25 MIX) (75-25) 100 UNIT/ML SUSP injection Inject 20 Units into the skin daily.    [provider]  insulin lispro protamine-lispro (HUMALOG 75/25 MIX) (75-25) 100 UNIT/ML SUSP injection Inject 18 Units into the skin at bedtime.    [provider]  memantine (NAMENDA) 10 MG tablet  Take 10 mg by mouth 2 (two) times daily. 01/30/20   [provider]  metoprolol tartrate (LOPRESSOR) 25 MG tablet Take 25 mg by mouth 2 (two) times daily.    [provider]  nitroGLYCERIN (NITROSTAT) 0.4 MG SL tablet Place 1 tablet (0.4 mg total) under the tongue every 5 (five) minutes as needed for chest pain. 11/30/18   Demetrios Loll, MD  NOVOLOG MIX 70/30 FLEXPEN (70-30) 100 UNIT/ML FlexPen Inject 0.12 mLs (12 Units total) into the skin 2 (two) times daily with a meal. 11/30/17   Bettey Costa, MD  Olopatadine HCl 0.2 % SOLN  02/06/20   [provider]  pantoprazole (PROTONIX) 40 MG tablet Take 40 mg by mouth daily.    [provider]  torsemide (DEMADEX) 20 MG tablet Take 20 mg by mouth daily. 08/17/19   [provider]  traMADol (ULTRAM) 50 MG tablet Take 50 mg by mouth 2 (two) times daily as needed. 11/07/19   [provider]  vitamin B-12 (CYANOCOBALAMIN) 1000 MCG tablet Take 1,000 mcg by mouth daily.    [provider]    Allergies Ciprofloxacin, Sulfamethoxazole-trimethoprim, Augmentin [amoxicillin-pot clavulanate], Hydrochlorothiazide, Levofloxacin, Losartan potassium-hctz, Nitrofurantoin, Sulfa antibiotics, Sulfasalazine, and Levaquin [levofloxacin in d5w]  Family History  Problem Relation Age of Onset  . Diabetes Mellitus II Mother   . Heart disease Mother   . Lung cancer Father   . Diabetes Paternal Grandmother   . Diabetes Maternal Grandmother   . Kidney disease Neg Hx   . Prostate cancer Neg Hx   . Bladder Cancer Neg Hx     Social History Social History   Tobacco Use  . Smoking status: Never Smoker  . Smokeless tobacco: Never Used  Substance Use Topics  . Alcohol use: No    Alcohol/week: 0.0 standard drinks  . Drug use: No    Review of Systems Constitutional: No fever/chills Eyes: No visual changes. ENT: No sore throat. Cardiovascular: Denies chest pain. Respiratory: Denies shortness of  breath. Gastrointestinal: No abdominal pain.  No nausea, no vomiting.   Genitourinary: Negative for dysuria. Musculoskeletal: Negative for back pain. Skin: Negative for rash. Neurological: Negative for headaches, focal weakness or numbness. ____________________________________________   PHYSICAL EXAM:  VITAL SIGNS: ED Triage Vitals [05/02/20 0723]  Enc Vitals Group     BP (!) 143/70     Pulse Rate 63     Resp 17     Temp 98 F (36.7 C)     Temp Source Oral     SpO2 97 %     Weight 188 lb (85.3 kg)     Height _0  (1.651 m)     Head Circumference      Peak Flow      Pain Score      Pain Loc      Pain Edu?      Excl. in [place]?     Constitutional: Alert and oriented. Well appearing and in no acute distress. Eyes: Conjunctivae are normal.  Head: Atraumatic. Neck: No stridor.   Cardiovascular: Normal rate, regular rhythm. Grossly normal heart sounds.  Good peripheral circulation. Respiratory: Normal respiratory effort.  No retractions. Lungs CTAB. Musculoskeletal: Able  to move upper and lower extremities.  Patient is not able to ambulate without a lot of assistance. Neurologic:  Normal speech and language. No gross focal neurologic deficits are appreciated. No gait instability. Skin:  Skin is warm, dry and intact. No rash noted. Psychiatric: Mood and affect are normal. Speech and behavior are normal.  ____________________________________________   LABS (all labs ordered are listed, but only abnormal results are displayed)  Labs Reviewed  CBG MONITORING, ED   PROCEDURES  Procedure(s) performed (including Critical Care):  Procedures   ____________________________________________   INITIAL IMPRESSION / ASSESSMENT AND PLAN / ED COURSE  As part of my medical decision making, I reviewed the following data within the electronic MEDICAL RECORD NUMBER Notes from prior ED visits and [place] Controlled Substance Database  86 year old female is brought to the ED via EMS for a  catheter replacement.  Nurse discovered that Foley was not connected and this was the reason that patient was leaking urine.  While patient was here the Foley catheter was replaced and [facility] facility was made aware.  ____________________________________________   FINAL CLINICAL IMPRESSION(S) / ED DIAGNOSES  Final diagnoses:  Encounter for Foley catheter replacement     ED Discharge Orders    None       Note:  This document was prepared using Dragon voice recognition software and may include unintentional dictation errors.    Letitia Neri  L, PA-C 05/02/20 1111    Lavonia Drafts, MD 05/02/20 1212

## 2020-05-02 NOTE — ED Notes (Signed)
[facility] sending someone to pick pt up.

## 2020-05-02 NOTE — ED Triage Notes (Signed)
See first nurse note. Pt reports it leaking and needs it replaced.

## 2020-05-02 NOTE — ED Notes (Signed)
22 fr foley removed with 30 cc balloon. Will replace with 22 fr foley

## 2020-05-02 NOTE — Discharge Instructions (Addendum)
Follow-up with your primary care provider if any continued problems or concerns. 

## 2020-05-02 NOTE — ED Notes (Signed)
Foley bag was not connected to foley on evaluation.  Pt from [facility].

## 2020-05-10 ENCOUNTER — Emergency Department
Admission: EM | Admit: 2020-05-10 | Discharge: 2020-05-11 | Disposition: A | Discharge status: Home / Self Care | Payer: Medicare Other | Attending: Emergency Medicine | Admitting: Emergency Medicine

## 2020-05-10 ENCOUNTER — Other Ambulatory Visit: Payer: Self-pay

## 2020-05-10 DIAGNOSIS — Z794 Long term (current) use of insulin: Secondary | ICD-10-CM | POA: Insufficient documentation

## 2020-05-10 DIAGNOSIS — Z7901 Long term (current) use of anticoagulants: Secondary | ICD-10-CM | POA: Diagnosis not present

## 2020-05-10 DIAGNOSIS — Z79899 Other long term (current) drug therapy: Secondary | ICD-10-CM | POA: Diagnosis not present

## 2020-05-10 DIAGNOSIS — T83098A Other mechanical complication of other indwelling urethral catheter, initial encounter: Secondary | ICD-10-CM | POA: Insufficient documentation

## 2020-05-10 DIAGNOSIS — Z880 Allergy status to penicillin: Secondary | ICD-10-CM | POA: Insufficient documentation

## 2020-05-10 DIAGNOSIS — T83511A Infection and inflammatory reaction due to indwelling urethral catheter, initial encounter: Secondary | ICD-10-CM | POA: Insufficient documentation

## 2020-05-10 DIAGNOSIS — I509 Heart failure, unspecified: Secondary | ICD-10-CM | POA: Insufficient documentation

## 2020-05-10 DIAGNOSIS — N390 Urinary tract infection, site not specified: Secondary | ICD-10-CM | POA: Diagnosis not present

## 2020-05-10 DIAGNOSIS — T83011A Breakdown (mechanical) of indwelling urethral catheter, initial encounter: Secondary | ICD-10-CM

## 2020-05-10 DIAGNOSIS — E1122 Type 2 diabetes mellitus with diabetic chronic kidney disease: Secondary | ICD-10-CM | POA: Diagnosis not present

## 2020-05-10 DIAGNOSIS — Z882 Allergy status to sulfonamides status: Secondary | ICD-10-CM | POA: Diagnosis not present

## 2020-05-10 DIAGNOSIS — I130 Hypertensive heart and chronic kidney disease with heart failure and stage 1 through stage 4 chronic kidney disease, or unspecified chronic kidney disease: Secondary | ICD-10-CM | POA: Insufficient documentation

## 2020-05-10 DIAGNOSIS — I480 Paroxysmal atrial fibrillation: Secondary | ICD-10-CM | POA: Insufficient documentation

## 2020-05-10 DIAGNOSIS — N189 Chronic kidney disease, unspecified: Secondary | ICD-10-CM | POA: Insufficient documentation

## 2020-05-10 DIAGNOSIS — F0390 Unspecified dementia without behavioral disturbance: Secondary | ICD-10-CM | POA: Insufficient documentation

## 2020-05-10 NOTE — ED Notes (Signed)
Pt HOH. Doesn't have her hearing aides. In from [facility] with issue to foley catheter. States it will not drain properly and that it has been leaking. Pt's briefs soaked in urine from hours ago as brief falling completely apart. Briefs from facility removed, peri care provided, and new bedpad and briefs applied. Foley draining some urine but not much. Somewhat cloudy. Pt reports has had tenderness in lower abdomen but denies currently. Skin intact.

## 2020-05-10 NOTE — ED Triage Notes (Signed)
PT comes from [facility] via [facility] due to her catheter not draining. EMS states that Cath was placed today. There is urine in the tube but pt feels that it is not draining.

## 2020-05-11 LAB — URINALYSIS, ROUTINE W REFLEX MICROSCOPIC
Bilirubin Urine: NEGATIVE
Glucose, UA: NEGATIVE mg/dL
Ketones, ur: NEGATIVE mg/dL
Nitrite: POSITIVE — AB
Protein, ur: NEGATIVE mg/dL
Specific Gravity, Urine: 1.013 (ref 1.005–1.030)
WBC, UA: >50 WBC/hpf — ABNORMAL HIGH (ref 0–5)
pH: 5 (ref 5.0–8.0)

## 2020-05-11 MED ORDER — CEPHALEXIN 500 MG PO CAPS
500.0000 mg | ORAL_CAPSULE | Freq: Once | ORAL | Status: AC
  Administered 2020-05-11: 500 mg via ORAL
  Filled 2020-05-11: qty 1

## 2020-05-11 MED ORDER — CEPHALEXIN 500 MG PO CAPS: 500.0000 mg | ORAL_CAPSULE | Freq: Two times a day (BID) | ORAL | 0 refills | Status: DC

## 2020-05-11 NOTE — Discharge Instructions (Addendum)
We once again replaced your Foley catheter since the one that was placed yesterday did not seem to be working exactly right.  Additionally, because the catheter yesterday was not capped off and was open to the environment, I am concerned about a greater possibility of developing an infection.  Even though it is very common to have bacteria in your urine when you have an indwelling Foley catheter, given the number of times your catheter has been changed recently and that it was open to the air, I started you on some antibiotics.  I recommend you follow-up with your urologist or Dr. Apolinar Junes or one of her colleagues at [facility] as the next available opportunity.  You may also follow-up with your regular doctor.  Return to the emergency department if you develop new or worsening symptoms that concern you.

## 2020-05-11 NOTE — ED Notes (Signed)
Report given in SBAR style to Johnny Bridge, Charity fundraiser at [facility]. RN expressed no concerns or questions to this RN regarding pt's d/c and current disposition. [facility] informed that pt's aprox ETA from ED departure to be . RN verbalized an understanding.

## 2020-05-11 NOTE — ED Notes (Signed)
Alissa NT and this RN removed foley cath and replaced with new 16Fr foley. Urine sample sent to lab. Upon removal of first foley noted that around 20cc of fluid had been used to inflate balloon; balloon slide out easily after 10cc removed but noted another 10cc remained. Pt did not c/o pain when balloon portion of foley removed. Urine noted immediately upon placement of new cath. Briefs changed and peri care completed. Pt repositioned in bed. Changed into gown as personal clothing had old urine stains on it.

## 2020-05-11 NOTE — ED Notes (Addendum)
Pt updated that we are still waiting on EMS transportation. Pt frustrated but understanding at this time

## 2020-05-11 NOTE — ED Provider Notes (Signed)
[facility] Emergency Department Provider Note  ____________________________________________   First MD Initiated Contact with Patient 05/10/20 2343     (approximate)  I have reviewed the triage vital signs and the nursing notes.   HISTORY  Chief Complaint Urinary Cathater Obstruction  Level 5 caveat:  history/ROS limited by chronic dementia  HPI Pamela [name] is a 86 y.o. female who was just in the emergency department yesterday for issues with her chronic indwelling Foley catheter and who has had visits in the past due to issues with her Foley catheter not functioning.  She presents tonight by EMS for evaluation of a nondraining Foley catheter.  There is a little bit of urine in the tubing but not in the bag.  She does not have a lot of pain but has some pressure.  She is not able to provide much additional history.  She has no pain at this time other than she says that it hurts at the site where the tube goes in.  She has had no nausea or vomiting.  She says she just mostly wants to go to sleep.         Past Medical History:  Diagnosis Date  . A-fib ([place]) 08/28/2014   Overview:  Overview:  Diastolic dysfunction on ECHO 04/2012-done at [facility]   . Abnormal gait 02/08/2012  . Abscess or cellulitis of leg 04/18/2012  . Absence of bladder continence 07/29/2012  . Acid reflux 02/08/2012  . Acute kidney failure ([place]) 05/15/2012  . Afib, paroxysmal, with RVR at times with BBB 05/13/2012  . Allergic drug rash 05/15/2012  . Anemia 05/13/2012  . 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 Benign essential HTN 02/08/2012  . Bladder infection, chronic 06/29/2013  . BP (high blood pressure) 05/12/2012  . Bursitis of knee 08/07/2014  . Cellulitis in diabetic foot ([place]) 05/12/2012  . CHF (congestive heart failure) ([place])   . Chronic kidney disease 06/01/2012   Overview:  Overview:   12/2012- Renal US, simple L cyst. UIEP normal   . Clinical depression 08/28/2014  . CN (constipation)  03/17/2012  . Dermatitis due to drug reaction 05/15/2012  . Diabetes mellitus   . Diastolic dysfunction, left ventricle 05/14/2012  . DM (diabetes mellitus) ([place]) 05/12/2012  . Dysrhythmia   . Dysuria   . Edema leg 07/02/2014  . Enthesopathy of knee 08/07/2014  . Excess fluid volume 05/20/2012  . FOM (frequency of micturition) 07/29/2012  . Frank hematuria 09/27/2014  . Gangrene ([place])   . GERD (gastroesophageal reflux disease)   . Heart disease   . High cholesterol   . HTN (hypertension) 05/12/2012  . Hypercholesterolemia 05/12/2012  . Hypertension   . Incomplete bladder emptying 07/29/2012  . Left bundle branch block (LBBB)   . Open wound of knee, leg (except thigh), and ankle 06/01/2012  . Stevens-Johnson syndrome ([place]) 05/15/2012   Overview:  Overview:  Several years ago, allergic reaction to sulfa antibiotic Several years ago, allergic reaction to sulfa antibiotic     Patient Active Problem List   Diagnosis Date Noted  . NSTEMI (non-ST elevated myocardial infarction) ([place]) 11/28/2018  . UTI (urinary tract infection) 11/28/2017  . Pelvic muscle wasting 01/26/2017  . Cystocele, midline 01/26/2017  . Urinary frequency 12/26/2015  . Incontinence 12/07/2015  . Cystocele 12/07/2015  . Tinea cruris 12/07/2015  . Recurrent UTI 08/11/2015  . Vaginal atrophy 08/11/2015  . 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 Discoordination 05/08/2015  . Frank hematuria 09/27/2014  . Clinical depression 08/28/2014  .  Major depressive disorder with single episode 08/28/2014  . Bursitis of knee 08/07/2014  . Enthesopathy of knee 08/07/2014  . Edema leg 07/02/2014  . Bladder infection, chronic 06/29/2013  . Incomplete bladder emptying 07/29/2012  . FOM (frequency of micturition) 07/29/2012  . Absence of bladder continence 07/29/2012  . Chronic kidney disease 06/01/2012  . Open wound of knee, leg (except thigh), and ankle 06/01/2012  . Skin rash 05/20/2012  . Acute kidney failure ([place]) 05/15/2012  . Allergic  drug rash 05/15/2012  . Stevens-Johnson syndrome ([place]) 05/15/2012  . Dermatitis due to drug taken internally 05/15/2012  . Dermatitis due to drug reaction 05/15/2012  . Acute renal failure ([place]) 05/15/2012  . Diastolic dysfunction, left ventricle 05/14/2012  . PAF (paroxysmal atrial fibrillation) ([place]) 05/13/2012  . Anemia 05/13/2012  . Cellulitis in diabetic foot ([place]) 05/12/2012  . HTN (hypertension) 05/12/2012  . Hypercholesterolemia 05/12/2012  . Abscess or cellulitis of leg 04/18/2012  . Diabetes mellitus ([place]) 04/18/2012  . CN (constipation) 03/17/2012  . Abnormal gait 02/08/2012  . Benign essential HTN 02/08/2012  . GERD (gastroesophageal reflux disease) 02/08/2012    Past Surgical History:  Procedure Laterality Date  . ABDOMINAL HYSTERECTOMY    . BLADDER SURGERY    . cataract surgery    . ENDOVENOUS ABLATION SAPHENOUS VEIN W/ LASER    . LEFT HEART CATH AND CORONARY ANGIOGRAPHY N/A 11/29/2018   Procedure: LEFT HEART CATH AND CORONARY ANGIOGRAPHY;  Surgeon: Corey Skains, MD;  Location: [facility];  Service: Cardiovascular;  Laterality: N/A;  . NASAL SINUS SURGERY    . TOE AMPUTATION    . TONSILLECTOMY      Prior to Admission medications   Medication Sig Start Date End Date Taking? Authorizing Provider  amiodarone (PACERONE) 200 MG tablet Take 200 mg by mouth daily.  12/16/15   [provider]  amitriptyline (ELAVIL) 50 MG tablet Take 50 mg by mouth at bedtime.  03/31/16   [provider]  amLODipine (NORVASC) 5 MG tablet Take 5 mg by mouth daily.    [provider]  atorvastatin (LIPITOR) 40 MG tablet Take 40 mg by mouth daily.    [provider]  bisacodyl (BISACODYL) 5 MG EC tablet Take 1 tablet (5 mg total) by mouth daily as needed for moderate constipation. 11/30/18   Demetrios Loll, MD  cephALEXin (KEFLEX) 500 MG capsule Take 1 capsule (500 mg total) by mouth 2 (two) times daily. 05/11/20   Hinda Kehr, MD  clopidogrel  (PLAVIX) 75 MG tablet Take 1 tablet (75 mg total) by mouth daily. 11/30/18   Demetrios Loll, MD  Glucagon, rDNA, (GLUCAGON EMERGENCY) 1 MG KIT  11/21/19   [provider]  hydrocortisone 1 % ointment Apply 1 application topically 2 (two) times daily. Please apply liberally to your vulva twice a day to help prevent further irritation 12/06/18   Darel Hong, MD  insulin lispro protamine-lispro (HUMALOG 75/25 MIX) (75-25) 100 UNIT/ML SUSP injection Inject 20 Units into the skin daily.    [provider]  insulin lispro protamine-lispro (HUMALOG 75/25 MIX) (75-25) 100 UNIT/ML SUSP injection Inject 18 Units into the skin at bedtime.    [provider]  memantine (NAMENDA) 10 MG tablet Take 10 mg by mouth 2 (two) times daily. 01/30/20   [provider]  metoprolol tartrate (LOPRESSOR) 25 MG tablet Take 25 mg by mouth 2 (two) times daily.    [provider]  nitroGLYCERIN (NITROSTAT) 0.4 MG SL tablet Place 1  tablet (0.4 mg total) under the tongue every 5 (five) minutes as needed for chest pain. 11/30/18   Demetrios Loll, MD  NOVOLOG MIX 70/30 FLEXPEN (70-30) 100 UNIT/ML FlexPen Inject 0.12 mLs (12 Units total) into the skin 2 (two) times daily with a meal. 11/30/17   Bettey Costa, MD  Olopatadine HCl 0.2 % SOLN  02/06/20   [provider]  pantoprazole (PROTONIX) 40 MG tablet Take 40 mg by mouth daily.    [provider]  torsemide (DEMADEX) 20 MG tablet Take 20 mg by mouth daily. 08/17/19   [provider]  traMADol (ULTRAM) 50 MG tablet Take 50 mg by mouth 2 (two) times daily as needed. 11/07/19   [provider]  vitamin B-12 (CYANOCOBALAMIN) 1000 MCG tablet Take 1,000 mcg by mouth daily.    [provider]    Allergies Ciprofloxacin, Sulfamethoxazole-trimethoprim, Augmentin [amoxicillin-pot clavulanate], Hydrochlorothiazide, Levofloxacin, Losartan potassium-hctz, Nitrofurantoin, Sulfa antibiotics, Sulfasalazine, and Levaquin  [levofloxacin in d5w]  Family History  Problem Relation Age of Onset  . Diabetes Mellitus II Mother   . Heart disease Mother   . Lung cancer Father   . Diabetes Paternal Grandmother   . Diabetes Maternal Grandmother   . Kidney disease Neg Hx   . Prostate cancer Neg Hx   . Bladder Cancer Neg Hx     Social History Social History   Tobacco Use  . Smoking status: Never Smoker  . Smokeless tobacco: Never Used  Vaping Use  . Vaping Use: Never assessed  Substance Use Topics  . Alcohol use: No    Alcohol/week: 0.0 standard drinks  . Drug use: No    Review of Systems Level 5 caveat:  history/ROS limited by acute/critical illness  ____________________________________________   PHYSICAL EXAM:  VITAL SIGNS: ED Triage Vitals  Enc Vitals Group     BP 05/10/20 2331 137/60     Pulse Rate 05/10/20 2331 (!) 55     Resp 05/10/20 2331 18     Temp 05/10/20 2331 97.8 F (36.6 C)     Temp Source 05/10/20 2331 Oral     SpO2 05/10/20 2331 95 %     Weight 05/10/20 2347 85.3 kg (188 lb)     Height 05/10/20 2347 1.651 m (_0 )     Head Circumference --      Peak Flow --      Pain Score 05/10/20 2347 0     Pain Loc --      Pain Edu? --      Excl. in [place]? --     Constitutional: Alert and oriented to person and place. Eyes: Conjunctivae are normal.  Head: Atraumatic. Cardiovascular: Normal rate, regular rhythm. Good peripheral circulation.  Respiratory: Normal respiratory effort.  No retractions. Gastrointestinal: Soft and nontender. No distention.  Genitourinary: Indwelling Foley catheter in place but draining minimal urine into the tube.  No gross hematuria.  Urine that is observed as yellow and very cloudy. Musculoskeletal: No lower extremity tenderness nor edema. No gross deformities of extremities. Neurologic:  Normal speech and language. No gross focal neurologic deficits are appreciated.  Skin:  Skin is warm, dry and intact.   ____________________________________________    LABS (all labs ordered are listed, but only abnormal results are displayed)  Labs Reviewed  URINALYSIS, ROUTINE W REFLEX MICROSCOPIC - Abnormal; Notable for the following components:      Result Value   Color, Urine YELLOW (*)    APPearance TURBID (*)    Hgb urine dipstick  MODERATE (*)    Nitrite POSITIVE (*)    Leukocytes,Ua LARGE (*)    WBC, UA >50 (*)    Bacteria, UA RARE (*)    All other components within normal limits  URINE CULTURE   ____________________________________________  EKG  None - EKG not ordered by ED physician ____________________________________________  RADIOLOGY Ursula Alert, personally viewed and evaluated these images (plain radiographs) as part of my medical decision making, as well as reviewing the written report by the radiologist.  ED MD interpretation: No indication for emergent imaging  Official radiology report(s): No results found.  ____________________________________________   PROCEDURES   Procedure(s) performed (including Critical Care):  Procedures   ____________________________________________   INITIAL IMPRESSION / MDM / [place] / ED COURSE  As part of my medical decision making, I reviewed the following data within the [facility] notes reviewed and incorporated, Labs reviewed , Old chart reviewed and Notes from prior ED visits   Differential diagnosis includes, but is not limited to, malfunction of Foley catheter, catheter associated UTI, obstructive uropathy.  Patient is in no distress.  Vital signs are stable and within normal limits.  She has no complaints other than some pain at the site of the Foley catheter and the malfunction of the catheter itself.  I reviewed the medical record and saw that when she came in yesterday and had the Foley catheter replaced, it was leaking because it was not hooked up to anything.  Although normally I would not necessarily check a urinalysis or urine  culture on a patient with a chronic Foley catheter unless there is a good reason to do so, I am concerned about the extensive instrumentation she has had recently as well as having a Foley that was open to the environment for an unknown period of time.  She had a bladder scan done tonight that showed about 100 mL of urine in the bladder.  I asked the nurse to replace the Foley and as soon as she replaced it with a new and there was an immediate return of urine that was not present with the Foley with which she came in.  Urinalysis is "dirty" as to be expected from an indwelling urinary catheter.  He I am giving her a dose of Keflex and a prescription for Keflex and encourage close follow-up with her urologist or primary care doctor.  She has a history of Proteus and E. coli urinary tract infections that have been sensitive to cephalosporins (both species).  I gave my usual and customary return precautions.           ____________________________________________  FINAL CLINICAL IMPRESSION(S) / ED DIAGNOSES  Final diagnoses:  Malfunction of Foley catheter, initial encounter ([place])  Urinary tract infection associated with indwelling urethral catheter, initial encounter ([place])     MEDICATIONS GIVEN DURING THIS VISIT:  Medications  cephALEXin (KEFLEX) capsule 500 mg (has no administration in time range)     ED Discharge Orders         Ordered    cephALEXin (KEFLEX) 500 MG capsule  2 times daily     Discontinue  Reprint     05/11/20 0231          *Please note:  Pamela [name] was evaluated in Emergency Department on 05/11/2020 for the symptoms described in the history of present illness. She was evaluated in the context of the global COVID-19 pandemic, which necessitated consideration that the patient might be at risk for  infection with the SARS-CoV-2 virus that causes COVID-19. Institutional protocols and algorithms that pertain to the evaluation of patients at risk for COVID-19 are in  a state of rapid change based on information released by regulatory bodies including the CDC and federal and state organizations. These policies and algorithms were followed during the patient's care in the ED.  Some ED evaluations and interventions may be delayed as a result of limited staffing during the pandemic.*  Note:  This document was prepared using Dragon voice recognition software and may include unintentional dictation errors.   Hinda Kehr, MD 05/11/20 [phone]

## 2020-05-11 NOTE — ED Notes (Signed)
To complete bladder scan once one found that works. Only one currently available will not turn on. Will check with Consulting civil engineer.

## 2020-05-11 NOTE — ED Notes (Signed)
Max of 91cc noted upon bladder scan.

## 2020-05-12 ENCOUNTER — Emergency Department
Admission: EM | Admit: 2020-05-12 | Discharge: 2020-05-12 | Disposition: A | Discharge status: Home / Self Care | Payer: Medicare Other | Attending: Emergency Medicine | Admitting: Emergency Medicine

## 2020-05-12 ENCOUNTER — Encounter: Payer: Self-pay | Admitting: Emergency Medicine

## 2020-05-12 DIAGNOSIS — T83021A Displacement of indwelling urethral catheter, initial encounter: Secondary | ICD-10-CM | POA: Diagnosis not present

## 2020-05-12 DIAGNOSIS — Y732 Prosthetic and other implants, materials and accessory gastroenterology and urology devices associated with adverse incidents: Secondary | ICD-10-CM | POA: Insufficient documentation

## 2020-05-12 DIAGNOSIS — I5030 Unspecified diastolic (congestive) heart failure: Secondary | ICD-10-CM | POA: Diagnosis not present

## 2020-05-12 DIAGNOSIS — Z7902 Long term (current) use of antithrombotics/antiplatelets: Secondary | ICD-10-CM | POA: Insufficient documentation

## 2020-05-12 DIAGNOSIS — Z466 Encounter for fitting and adjustment of urinary device: Secondary | ICD-10-CM | POA: Diagnosis present

## 2020-05-12 DIAGNOSIS — N189 Chronic kidney disease, unspecified: Secondary | ICD-10-CM | POA: Insufficient documentation

## 2020-05-12 DIAGNOSIS — E1122 Type 2 diabetes mellitus with diabetic chronic kidney disease: Secondary | ICD-10-CM | POA: Insufficient documentation

## 2020-05-12 DIAGNOSIS — Z794 Long term (current) use of insulin: Secondary | ICD-10-CM | POA: Diagnosis not present

## 2020-05-12 DIAGNOSIS — I130 Hypertensive heart and chronic kidney disease with heart failure and stage 1 through stage 4 chronic kidney disease, or unspecified chronic kidney disease: Secondary | ICD-10-CM | POA: Insufficient documentation

## 2020-05-12 DIAGNOSIS — Z79899 Other long term (current) drug therapy: Secondary | ICD-10-CM | POA: Diagnosis not present

## 2020-05-12 DIAGNOSIS — Z89429 Acquired absence of other toe(s), unspecified side: Secondary | ICD-10-CM | POA: Insufficient documentation

## 2020-05-12 LAB — URINE CULTURE
Culture: >=100000 — AB
Special Requests: NORMAL

## 2020-05-12 NOTE — ED Triage Notes (Signed)
Pt to ED by EMS with displaced urinary catheter. Pt has no c/o of pain.

## 2020-05-12 NOTE — ED Provider Notes (Signed)
[facility] Emergency Department Provider Note   ____________________________________________    I have reviewed the triage vital signs and the nursing notes.   HISTORY  Chief Complaint Urinary Catheter Issue   History limited by chronic dementia  HPI Pamela [name] is a 86 y.o. female with history as noted below who has chronic Foley catheter who sent to the emergency department today for dislodged Foley catheter.  Review of record demonstrates that this does happen relatively frequently.  No reports of fever or other complaints.  Past Medical History:  Diagnosis Date  . A-fib ([place]) 08/28/2014   Overview:  Overview:  Diastolic dysfunction on ECHO 04/2012-done at [facility]   . Abnormal gait 02/08/2012  . Abscess or cellulitis of leg 04/18/2012  . Absence of bladder continence 07/29/2012  . Acid reflux 02/08/2012  . Acute kidney failure ([place]) 05/15/2012  . Afib, paroxysmal, with RVR at times with BBB 05/13/2012  . Allergic drug rash 05/15/2012  . Anemia 05/13/2012  . 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 Benign essential HTN 02/08/2012  . Bladder infection, chronic 06/29/2013  . BP (high blood pressure) 05/12/2012  . Bursitis of knee 08/07/2014  . Cellulitis in diabetic foot ([place]) 05/12/2012  . CHF (congestive heart failure) ([place])   . Chronic kidney disease 06/01/2012   Overview:  Overview:   12/2012- Renal US, simple L cyst. UIEP normal   . Clinical depression 08/28/2014  . CN (constipation) 03/17/2012  . Dermatitis due to drug reaction 05/15/2012  . Diabetes mellitus   . Diastolic dysfunction, left ventricle 05/14/2012  . DM (diabetes mellitus) ([place]) 05/12/2012  . Dysrhythmia   . Dysuria   . Edema leg 07/02/2014  . Enthesopathy of knee 08/07/2014  . Excess fluid volume 05/20/2012  . FOM (frequency of micturition) 07/29/2012  . Frank hematuria 09/27/2014  . Gangrene ([place])   . GERD (gastroesophageal reflux disease)   . Heart disease   . High cholesterol   . HTN  (hypertension) 05/12/2012  . Hypercholesterolemia 05/12/2012  . Hypertension   . Incomplete bladder emptying 07/29/2012  . Left bundle branch block (LBBB)   . Open wound of knee, leg (except thigh), and ankle 06/01/2012  . Stevens-Johnson syndrome ([place]) 05/15/2012   Overview:  Overview:  Several years ago, allergic reaction to sulfa antibiotic Several years ago, allergic reaction to sulfa antibiotic     Patient Active Problem List   Diagnosis Date Noted  . NSTEMI (non-ST elevated myocardial infarction) ([place]) 11/28/2018  . UTI (urinary tract infection) 11/28/2017  . Pelvic muscle wasting 01/26/2017  . Cystocele, midline 01/26/2017  . Urinary frequency 12/26/2015  . Incontinence 12/07/2015  . Cystocele 12/07/2015  . Tinea cruris 12/07/2015  . Recurrent UTI 08/11/2015  . Vaginal atrophy 08/11/2015  . 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 Discoordination 05/08/2015  . Frank hematuria 09/27/2014  . Clinical depression 08/28/2014  . Major depressive disorder with single episode 08/28/2014  . Bursitis of knee 08/07/2014  . Enthesopathy of knee 08/07/2014  . Edema leg 07/02/2014  . Bladder infection, chronic 06/29/2013  . Incomplete bladder emptying 07/29/2012  . FOM (frequency of micturition) 07/29/2012  . Absence of bladder continence 07/29/2012  . Chronic kidney disease 06/01/2012  . Open wound of knee, leg (except thigh), and ankle 06/01/2012  . Skin rash 05/20/2012  . Acute kidney failure ([place]) 05/15/2012  . Allergic drug rash 05/15/2012  . Stevens-Johnson syndrome ([place]) 05/15/2012  . Dermatitis due to drug taken internally 05/15/2012  . Dermatitis due to  drug reaction 05/15/2012  . Acute renal failure ([facility]) 05/15/2012  . Diastolic dysfunction, left ventricle 05/14/2012  . PAF (paroxysmal atrial fibrillation) ([facility]) 05/13/2012  . Anemia 05/13/2012  . Cellulitis in diabetic foot ([facility]) 05/12/2012  . HTN (hypertension) 05/12/2012  . Hypercholesterolemia 05/12/2012  . Abscess or  cellulitis of leg 04/18/2012  . Diabetes mellitus ([facility]) 04/18/2012  . CN (constipation) 03/17/2012  . Abnormal gait 02/08/2012  . Benign essential HTN 02/08/2012  . GERD (gastroesophageal reflux disease) 02/08/2012    Past Surgical History:  Procedure Laterality Date  . ABDOMINAL HYSTERECTOMY    . BLADDER SURGERY    . cataract surgery    . ENDOVENOUS ABLATION SAPHENOUS VEIN W/ LASER    . LEFT HEART CATH AND CORONARY ANGIOGRAPHY N/A 11/29/2018   Procedure: LEFT HEART CATH AND CORONARY ANGIOGRAPHY;  Surgeon: Lamar Blinks, MD;  Location: [facility] INVASIVE CV LAB;  Service: Cardiovascular;  Laterality: N/A;  . NASAL SINUS SURGERY    . TOE AMPUTATION    . TONSILLECTOMY      Prior to Admission medications   Medication Sig Start Date End Date Taking? Authorizing Provider  amiodarone (PACERONE) 200 MG tablet Take 200 mg by mouth daily.  12/16/15   [provider]  amitriptyline (ELAVIL) 50 MG tablet Take 50 mg by mouth at bedtime.  03/31/16   [provider]  amLODipine (NORVASC) 5 MG tablet Take 5 mg by mouth daily.    [provider]  atorvastatin (LIPITOR) 40 MG tablet Take 40 mg by mouth daily.    [provider]  bisacodyl (BISACODYL) 5 MG EC tablet Take 1 tablet (5 mg total) by mouth daily as needed for moderate constipation. 11/30/18   Shaune Pollack, MD  cephALEXin (KEFLEX) 500 MG capsule Take 1 capsule (500 mg total) by mouth 2 (two) times daily. 05/11/20   Loleta Rose, MD  clopidogrel (PLAVIX) 75 MG tablet Take 1 tablet (75 mg total) by mouth daily. 11/30/18   Shaune Pollack, MD  Glucagon, rDNA, (GLUCAGON EMERGENCY) 1 MG KIT  11/21/19   [provider]  hydrocortisone 1 % ointment Apply 1 application topically 2 (two) times daily. Please apply liberally to your vulva twice a day to help prevent further irritation 12/06/18   Merrily Brittle, MD  insulin lispro protamine-lispro (HUMALOG 75/25 MIX) (75-25) 100 UNIT/ML SUSP injection Inject 20 Units into  the skin daily.    [provider]  insulin lispro protamine-lispro (HUMALOG 75/25 MIX) (75-25) 100 UNIT/ML SUSP injection Inject 18 Units into the skin at bedtime.    [provider]  memantine (NAMENDA) 10 MG tablet Take 10 mg by mouth 2 (two) times daily. 01/30/20   [provider]  metoprolol tartrate (LOPRESSOR) 25 MG tablet Take 25 mg by mouth 2 (two) times daily.    [provider]  nitroGLYCERIN (NITROSTAT) 0.4 MG SL tablet Place 1 tablet (0.4 mg total) under the tongue every 5 (five) minutes as needed for chest pain. 11/30/18   Shaune Pollack, MD  NOVOLOG MIX 70/30 FLEXPEN (70-30) 100 UNIT/ML FlexPen Inject 0.12 mLs (12 Units total) into the skin 2 (two) times daily with a meal. 11/30/17   Adrian Saran, MD  Olopatadine HCl 0.2 % SOLN  02/06/20   [provider]  pantoprazole (PROTONIX) 40 MG tablet Take 40 mg by mouth daily.    [provider]  torsemide (DEMADEX) 20 MG tablet Take 20 mg by mouth daily. 08/17/19   [provider]  traMADol Janean Sark) 50  MG tablet Take 50 mg by mouth 2 (two) times daily as needed. 11/07/19   [provider]  vitamin B-12 (CYANOCOBALAMIN) 1000 MCG tablet Take 1,000 mcg by mouth daily.    [provider]     Allergies Ciprofloxacin, Sulfamethoxazole-trimethoprim, Augmentin [amoxicillin-pot clavulanate], Hydrochlorothiazide, Levofloxacin, Losartan potassium-hctz, Nitrofurantoin, Sulfa antibiotics, Sulfasalazine, and Levaquin [levofloxacin in d5w]  Family History  Problem Relation Age of Onset  . Diabetes Mellitus II Mother   . Heart disease Mother   . Lung cancer Father   . Diabetes Paternal Grandmother   . Diabetes Maternal Grandmother   . Kidney disease Neg Hx   . Prostate cancer Neg Hx   . Bladder Cancer Neg Hx     Social History Social History   Tobacco Use  . Smoking status: Never Smoker  . Smokeless tobacco: Never Used  Vaping Use  . Vaping Use: Never assessed  Substance  Use Topics  . Alcohol use: No    Alcohol/week: 0.0 standard drinks  . Drug use: No    Review of Systems limited by dementia  Constitutional: No reports of fever  Genitourinary: Catheter dislodged as above    ____________________________________________   PHYSICAL EXAM:  VITAL SIGNS: ED Triage Vitals [05/12/20 0817]  Enc Vitals Group     BP (!) 154/67     Pulse Rate 64     Resp 18     Temp (!) 97.5 F (36.4 C)     Temp Source Oral     SpO2 97 %     Weight      Height      Head Circumference      [facility] Flow      Pain Score 0     Pain Loc      Pain Edu?      Excl. in [place]?     Constitutional: Alert, no acute distress Eyes: Conjunctivae are normal.  Head: Atraumatic. Nose: No congestion/rhinnorhea. Mouth/Throat: Mucous membranes are moist.    Cardiovascular: Normal rate, regular rhythm.  Good peripheral circulation. Respiratory: Normal respiratory effort.  No retractions.  Gastrointestinal: Soft and nontender. No distention.  No CVA tenderness. Genitourinary: Catheter dislodged with balloon inflated Musculoskeletal:   Warm and well perfused Neurologic:  Normal speech and language. No gross focal neurologic deficits are appreciated.  Skin:  Skin is warm, dry and intact. No rash noted. Psychiatric: Mood and affect are normal. Speech and behavior are normal.  ____________________________________________   LABS (all labs ordered are listed, but only abnormal results are displayed)  Labs Reviewed  URINE CULTURE   ____________________________________________  EKG  None ____________________________________________  RADIOLOGY  None ____________________________________________   PROCEDURES  Procedure(s) performed: No  Procedures   Critical Care performed: No ____________________________________________   INITIAL IMPRESSION / ASSESSMENT AND PLAN / ED COURSE  Pertinent labs & imaging results that were available during my care of the patient were  reviewed by me and considered in my medical decision making (see chart for details).  Patient with dislodged Foley catheter.  Discussed with Dr. Bernardo Heater of urology who notes that patients with chronic Foley's may need additional inflation of balloon, will reinsert Foley catheter with 17 cc in balloon  Urine culture sent, appropriate for discharge with outpatient follow-up with urology    ____________________________________________   FINAL CLINICAL IMPRESSION(S) / ED DIAGNOSES  Final diagnoses:  Displacement of Foley catheter, initial encounter [facility])        Note:  This document was prepared using Dragon voice recognition software and  may include unintentional dictation errors.   Lavonia Drafts, MD 05/12/20 1014

## 2020-05-12 NOTE — ED Notes (Signed)
This RN spoke with facility to inform them of pt's return to facility.

## 2020-05-12 NOTE — ED Notes (Signed)
Pt verbalized understanding of discharge instructions. Discharge instructions sent with EMS for facility review. Pt in  NAD at this time.

## 2020-05-14 LAB — URINE CULTURE: Culture: 20000 — AB

## 2020-10-11 ENCOUNTER — Ambulatory Visit: Payer: Medicare Other | Admitting: Physician Assistant

## 2020-10-31 ENCOUNTER — Telehealth: Payer: Self-pay

## 2020-10-31 ENCOUNTER — Ambulatory Visit: Payer: Medicare Other | Admitting: Physician Assistant

## 2020-10-31 NOTE — Telephone Encounter (Signed)
Called [facility] and spoke with charge nurse she is not sure why patient was scheduled for apt today for SPT tube exchange. Patient has foley not SPT tube, they will continue to change. Charge nurse states they wanted to know if patient could go with out a catheter due if possible. It was explained that she has a history of incomplete bladder emptying which is why she has a chronic foley. An apt can be made with patient and her son if need to discuss further if they wish. Today's apt was cancelled they will call back if they wish to discuss further.

## 2021-02-13 ENCOUNTER — Emergency Department: Payer: Medicare Other

## 2021-02-13 ENCOUNTER — Emergency Department
Admission: EM | Admit: 2021-02-13 | Discharge: 2021-02-13 | Disposition: A | Discharge status: Home / Self Care | Payer: Medicare Other | Attending: Emergency Medicine | Admitting: Emergency Medicine

## 2021-02-13 DIAGNOSIS — Z79899 Other long term (current) drug therapy: Secondary | ICD-10-CM | POA: Insufficient documentation

## 2021-02-13 DIAGNOSIS — E1122 Type 2 diabetes mellitus with diabetic chronic kidney disease: Secondary | ICD-10-CM | POA: Insufficient documentation

## 2021-02-13 DIAGNOSIS — N189 Chronic kidney disease, unspecified: Secondary | ICD-10-CM | POA: Insufficient documentation

## 2021-02-13 DIAGNOSIS — Z794 Long term (current) use of insulin: Secondary | ICD-10-CM | POA: Insufficient documentation

## 2021-02-13 DIAGNOSIS — W06XXXA Fall from bed, initial encounter: Secondary | ICD-10-CM | POA: Insufficient documentation

## 2021-02-13 DIAGNOSIS — I130 Hypertensive heart and chronic kidney disease with heart failure and stage 1 through stage 4 chronic kidney disease, or unspecified chronic kidney disease: Secondary | ICD-10-CM | POA: Diagnosis not present

## 2021-02-13 DIAGNOSIS — S81812A Laceration without foreign body, left lower leg, initial encounter: Secondary | ICD-10-CM | POA: Diagnosis not present

## 2021-02-13 DIAGNOSIS — Z23 Encounter for immunization: Secondary | ICD-10-CM | POA: Insufficient documentation

## 2021-02-13 DIAGNOSIS — Z7902 Long term (current) use of antithrombotics/antiplatelets: Secondary | ICD-10-CM | POA: Diagnosis not present

## 2021-02-13 DIAGNOSIS — T83511A Infection and inflammatory reaction due to indwelling urethral catheter, initial encounter: Secondary | ICD-10-CM | POA: Insufficient documentation

## 2021-02-13 DIAGNOSIS — S8992XA Unspecified injury of left lower leg, initial encounter: Secondary | ICD-10-CM | POA: Diagnosis present

## 2021-02-13 DIAGNOSIS — I509 Heart failure, unspecified: Secondary | ICD-10-CM | POA: Insufficient documentation

## 2021-02-13 DIAGNOSIS — W19XXXA Unspecified fall, initial encounter: Secondary | ICD-10-CM

## 2021-02-13 DIAGNOSIS — I6782 Cerebral ischemia: Secondary | ICD-10-CM | POA: Diagnosis not present

## 2021-02-13 DIAGNOSIS — N390 Urinary tract infection, site not specified: Secondary | ICD-10-CM

## 2021-02-13 LAB — CBC WITH DIFFERENTIAL/PLATELET
Abs Immature Granulocytes: 0.04 10*3/uL (ref 0.00–0.07)
Basophils Absolute: 0.1 10*3/uL (ref 0.0–0.1)
Basophils Relative: 1 %
Eosinophils Absolute: 0.1 10*3/uL (ref 0.0–0.5)
Eosinophils Relative: 1 %
HCT: 36.5 % (ref 36.0–46.0)
Hemoglobin: 12.4 g/dL (ref 12.0–15.0)
Immature Granulocytes: 1 %
Lymphocytes Relative: 10 %
Lymphs Abs: 0.9 10*3/uL (ref 0.7–4.0)
MCH: 32.8 pg (ref 26.0–34.0)
MCHC: 34 g/dL (ref 30.0–36.0)
MCV: 96.6 fL (ref 80.0–100.0)
Monocytes Absolute: 0.7 10*3/uL (ref 0.1–1.0)
Monocytes Relative: 8 %
Neutro Abs: 7 10*3/uL (ref 1.7–7.7)
Neutrophils Relative %: 79 %
Platelets: 131 10*3/uL — ABNORMAL LOW (ref 150–400)
RBC: 3.78 MIL/uL — ABNORMAL LOW (ref 3.87–5.11)
RDW: 13.8 % (ref 11.5–15.5)
WBC: 8.7 10*3/uL (ref 4.0–10.5)
nRBC: 0 % (ref 0.0–0.2)

## 2021-02-13 LAB — CBG MONITORING, ED
Glucose-Capillary: 401 mg/dL — ABNORMAL HIGH (ref 70–99)
Glucose-Capillary: 378 mg/dL — ABNORMAL HIGH (ref 70–99)
Glucose-Capillary: 323 mg/dL — ABNORMAL HIGH (ref 70–99)

## 2021-02-13 LAB — URINALYSIS, COMPLETE (UACMP) WITH MICROSCOPIC
Bacteria, UA: NONE SEEN
Bilirubin Urine: NEGATIVE
Glucose, UA: >=500 mg/dL — AB
Ketones, ur: 5 mg/dL — AB
Nitrite: NEGATIVE
Protein, ur: 100 mg/dL — AB
Specific Gravity, Urine: 1.012 (ref 1.005–1.030)
WBC, UA: >50 WBC/hpf — ABNORMAL HIGH (ref 0–5)
pH: 7 (ref 5.0–8.0)

## 2021-02-13 LAB — BASIC METABOLIC PANEL
Anion gap: 9 (ref 5–15)
BUN: 43 mg/dL — ABNORMAL HIGH (ref 8–23)
CO2: 25 mmol/L (ref 22–32)
Calcium: 8.8 mg/dL — ABNORMAL LOW (ref 8.9–10.3)
Chloride: 96 mmol/L — ABNORMAL LOW (ref 98–111)
Creatinine, Ser: 1.8 mg/dL — ABNORMAL HIGH (ref 0.44–1.00)
GFR, Estimated: 27 mL/min — ABNORMAL LOW (ref >60)
Glucose, Bld: 495 mg/dL — ABNORMAL HIGH (ref 70–99)
Potassium: 3.8 mmol/L (ref 3.5–5.1)
Sodium: 130 mmol/L — ABNORMAL LOW (ref 135–145)

## 2021-02-13 MED ORDER — ACETAMINOPHEN 500 MG PO TABS
ORAL_TABLET | ORAL | Status: AC
  Filled 2021-02-13: qty 2

## 2021-02-13 MED ORDER — BACITRACIN ZINC 500 UNIT/GM EX OINT
TOPICAL_OINTMENT | Freq: Once | CUTANEOUS | Status: AC
  Filled 2021-02-13: qty 0.9

## 2021-02-13 MED ORDER — INSULIN ASPART 100 UNIT/ML ~~LOC~~ SOLN
10.0000 [IU] | Freq: Once | SUBCUTANEOUS | Status: AC
  Administered 2021-02-13: 10 [IU] via INTRAVENOUS
  Filled 2021-02-13: qty 1

## 2021-02-13 MED ORDER — LACTATED RINGERS IV BOLUS
1000.0000 mL | Freq: Once | INTRAVENOUS | Status: AC
  Administered 2021-02-13: 1000 mL via INTRAVENOUS

## 2021-02-13 MED ORDER — LIDOCAINE-EPINEPHRINE 2 %-1:100000 IJ SOLN
20.0000 mL | Freq: Once | INTRAMUSCULAR | Status: AC
  Administered 2021-02-13: 20 mL
  Filled 2021-02-13: qty 1

## 2021-02-13 MED ORDER — TETANUS-DIPHTH-ACELL PERTUSSIS 5-2.5-18.5 LF-MCG/0.5 IM SUSY
0.5000 mL | PREFILLED_SYRINGE | Freq: Once | INTRAMUSCULAR | Status: AC
  Administered 2021-02-13: 0.5 mL via INTRAMUSCULAR
  Filled 2021-02-13: qty 0.5

## 2021-02-13 MED ORDER — CEPHALEXIN 500 MG PO CAPS: 500.0000 mg | ORAL_CAPSULE | Freq: Four times a day (QID) | ORAL | 0 refills | Status: AC

## 2021-02-13 MED ORDER — ACETAMINOPHEN 500 MG PO TABS
1000.0000 mg | ORAL_TABLET | Freq: Once | ORAL | Status: AC
  Administered 2021-02-13: 1000 mg via ORAL

## 2021-02-13 MED ORDER — SODIUM CHLORIDE 0.9 % IV SOLN
1.0000 g | Freq: Once | INTRAVENOUS | Status: AC
  Administered 2021-02-13: 1 g via INTRAVENOUS
  Filled 2021-02-13: qty 10

## 2021-02-13 NOTE — ED Provider Notes (Signed)
[name] Emergency Department Provider Note ____________________________________________   Event Date/Time   First MD Initiated Contact with Patient 02/13/21 1755     (approximate)  I have reviewed the triage vital signs and the nursing notes.  HISTORY  Chief Complaint Fall (FALL, UNWITNESSED, PT ON THINNERS ,  LEFT LEG INJURY )   HPI Pamela [name] is a 87 y.o. femalewho presents to the ED for evaluation of left leg pain after a fall.   Chart review indicates history of A. fib on amiodarone and Plavix, DM on insulin.  HTN, HLD, CHF on torsemide.  Patient presents to the ED from local SNF, [facility], for evaluation of left leg laceration and pain after a fall.  Patient ports transferring between the bed and a wheelchair when she accidentally fell and struck her left shin on nearby furniture.  She reports laceration and pain since that time.  She reports that she does not ambulate very much.  Currently reporting 6/10 intensity burning pain to her left shin.  Denies pain elsewhere.  Denies head or abdominal pain.  Denies chest pain.  Denies recent illnesses.  History somewhat limited due to her disorientation  Past Medical History:  Diagnosis Date  . A-fib ([place]) 08/28/2014   Overview:  Overview:  Diastolic dysfunction on ECHO 04/2012-done at [facility]   . Abnormal gait 02/08/2012  . Abscess or cellulitis of leg 04/18/2012  . Absence of bladder continence 07/29/2012  . Acid reflux 02/08/2012  . Acute kidney failure ([place]) 05/15/2012  . Afib, paroxysmal, with RVR at times with BBB 05/13/2012  . Allergic drug rash 05/15/2012  . Anemia 05/13/2012  . 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 Benign essential HTN 02/08/2012  . Bladder infection, chronic 06/29/2013  . BP (high blood pressure) 05/12/2012  . Bursitis of knee 08/07/2014  . Cellulitis in diabetic foot ([place]) 05/12/2012  . CHF (congestive heart failure) ([place])   . Chronic kidney disease 06/01/2012   Overview:   Overview:   12/2012- Renal US, simple L cyst. UIEP normal   . Clinical depression 08/28/2014  . CN (constipation) 03/17/2012  . Dermatitis due to drug reaction 05/15/2012  . Diabetes mellitus   . Diastolic dysfunction, left ventricle 05/14/2012  . DM (diabetes mellitus) ([place]) 05/12/2012  . Dysrhythmia   . Dysuria   . Edema leg 07/02/2014  . Enthesopathy of knee 08/07/2014  . Excess fluid volume 05/20/2012  . FOM (frequency of micturition) 07/29/2012  . Frank hematuria 09/27/2014  . Gangrene ([place])   . GERD (gastroesophageal reflux disease)   . Heart disease   . High cholesterol   . HTN (hypertension) 05/12/2012  . Hypercholesterolemia 05/12/2012  . Hypertension   . Incomplete bladder emptying 07/29/2012  . Left bundle branch block (LBBB)   . Open wound of knee, leg (except thigh), and ankle 06/01/2012  . Stevens-Johnson syndrome ([place]) 05/15/2012   Overview:  Overview:  Several years ago, allergic reaction to sulfa antibiotic Several years ago, allergic reaction to sulfa antibiotic     Patient Active Problem List   Diagnosis Date Noted  . NSTEMI (non-ST elevated myocardial infarction) ([place]) 11/28/2018  . UTI (urinary tract infection) 11/28/2017  . Pelvic muscle wasting 01/26/2017  . Cystocele, midline 01/26/2017  . Urinary frequency 12/26/2015  . Incontinence 12/07/2015  . Cystocele 12/07/2015  . Tinea cruris 12/07/2015  . Recurrent UTI 08/11/2015  . Vaginal atrophy 08/11/2015  . 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 Discoordination 05/08/2015  . Frank hematuria 09/27/2014  .  Clinical depression 08/28/2014  . Major depressive disorder with single episode 08/28/2014  . Bursitis of knee 08/07/2014  . Enthesopathy of knee 08/07/2014  . Edema leg 07/02/2014  . Bladder infection, chronic 06/29/2013  . Incomplete bladder emptying 07/29/2012  . FOM (frequency of micturition) 07/29/2012  . Absence of bladder continence 07/29/2012  . Chronic kidney disease 06/01/2012  . Open wound of knee, leg  (except thigh), and ankle 06/01/2012  . Skin rash 05/20/2012  . Acute kidney failure ([place]) 05/15/2012  . Allergic drug rash 05/15/2012  . Stevens-Johnson syndrome ([place]) 05/15/2012  . Dermatitis due to drug taken internally 05/15/2012  . Dermatitis due to drug reaction 05/15/2012  . Acute renal failure ([place]) 05/15/2012  . Diastolic dysfunction, left ventricle 05/14/2012  . PAF (paroxysmal atrial fibrillation) ([place]) 05/13/2012  . Anemia 05/13/2012  . Cellulitis in diabetic foot ([place]) 05/12/2012  . HTN (hypertension) 05/12/2012  . Hypercholesterolemia 05/12/2012  . Abscess or cellulitis of leg 04/18/2012  . Diabetes mellitus ([place]) 04/18/2012  . CN (constipation) 03/17/2012  . Abnormal gait 02/08/2012  . Benign essential HTN 02/08/2012  . GERD (gastroesophageal reflux disease) 02/08/2012    Past Surgical History:  Procedure Laterality Date  . ABDOMINAL HYSTERECTOMY    . BLADDER SURGERY    . cataract surgery    . ENDOVENOUS ABLATION SAPHENOUS VEIN W/ LASER    . LEFT HEART CATH AND CORONARY ANGIOGRAPHY N/A 11/29/2018   Procedure: LEFT HEART CATH AND CORONARY ANGIOGRAPHY;  Surgeon: Corey Skains, MD;  Location: [facility];  Service: Cardiovascular;  Laterality: N/A;  . NASAL SINUS SURGERY    . TOE AMPUTATION    . TONSILLECTOMY      Prior to Admission medications   Medication Sig Start Date End Date Taking? Authorizing Provider  cephALEXin (KEFLEX) 500 MG capsule Take 1 capsule (500 mg total) by mouth 4 (four) times daily for 7 days. 02/13/21 02/20/21 Yes Vladimir Crofts, MD  amiodarone (PACERONE) 200 MG tablet Take 200 mg by mouth daily.  12/16/15   [provider]  amitriptyline (ELAVIL) 50 MG tablet Take 50 mg by mouth at bedtime.  03/31/16   [provider]  amLODipine (NORVASC) 5 MG tablet Take 5 mg by mouth daily.    [provider]  atorvastatin (LIPITOR) 40 MG tablet Take 40 mg by mouth daily.    [provider]  bisacodyl  (BISACODYL) 5 MG EC tablet Take 1 tablet (5 mg total) by mouth daily as needed for moderate constipation. 11/30/18   Demetrios Loll, MD  cephALEXin (KEFLEX) 500 MG capsule Take 1 capsule (500 mg total) by mouth 2 (two) times daily. 05/11/20   Hinda Kehr, MD  clopidogrel (PLAVIX) 75 MG tablet Take 1 tablet (75 mg total) by mouth daily. 11/30/18   Demetrios Loll, MD  Glucagon, rDNA, (GLUCAGON EMERGENCY) 1 MG KIT  11/21/19   [provider]  hydrocortisone 1 % ointment Apply 1 application topically 2 (two) times daily. Please apply liberally to your vulva twice a day to help prevent further irritation 12/06/18   Darel Hong, MD  insulin lispro protamine-lispro (HUMALOG 75/25 MIX) (75-25) 100 UNIT/ML SUSP injection Inject 20 Units into the skin daily.    [provider]  insulin lispro protamine-lispro (HUMALOG 75/25 MIX) (75-25) 100 UNIT/ML SUSP injection Inject 18 Units into the skin at bedtime.    [provider]  memantine (NAMENDA) 10 MG tablet Take 10 mg by mouth 2 (two) times daily. 01/30/20   [provider]  metoprolol tartrate (LOPRESSOR) 25 MG tablet Take 25 mg by mouth 2 (two) times daily.    [provider]  nitroGLYCERIN (NITROSTAT) 0.4 MG SL tablet Place 1 tablet (0.4 mg total) under the tongue every 5 (five) minutes as needed for chest pain. 11/30/18   Demetrios Loll, MD  NOVOLOG MIX 70/30 FLEXPEN (70-30) 100 UNIT/ML FlexPen Inject 0.12 mLs (12 Units total) into the skin 2 (two) times daily with a meal. 11/30/17   Bettey Costa, MD  Olopatadine HCl 0.2 % SOLN  02/06/20   [provider]  pantoprazole (PROTONIX) 40 MG tablet Take 40 mg by mouth daily.    [provider]  torsemide (DEMADEX) 20 MG tablet Take 20 mg by mouth daily. 08/17/19   [provider]  traMADol (ULTRAM) 50 MG tablet Take 50 mg by mouth 2 (two) times daily as needed. 11/07/19   [provider]  vitamin B-12 (CYANOCOBALAMIN) 1000 MCG tablet Take 1,000 mcg by mouth  daily.    [provider]    Allergies Ciprofloxacin, Sulfamethoxazole-trimethoprim, Augmentin [amoxicillin-pot clavulanate], Hydrochlorothiazide, Levofloxacin, Losartan potassium-hctz, Nitrofurantoin, Sulfa antibiotics, Sulfasalazine, and Levaquin [levofloxacin in d5w]  Family History  Problem Relation Age of Onset  . Diabetes Mellitus II Mother   . Heart disease Mother   . Lung cancer Father   . Diabetes Paternal Grandmother   . Diabetes Maternal Grandmother   . Kidney disease Neg Hx   . Prostate cancer Neg Hx   . Bladder Cancer Neg Hx     Social History Social History   Tobacco Use  . Smoking status: Never Smoker  . Smokeless tobacco: Never Used  Substance Use Topics  . Alcohol use: No    Alcohol/week: 0.0 standard drinks  . Drug use: No    Review of Systems  Unable to be accurately assessed due to patient's disorientation ____________________________________________   PHYSICAL EXAM:  VITAL SIGNS: Vitals:   02/13/21 2025 02/13/21 2156  BP: 139/78 (!) 147/79  Pulse: 66 65  Resp: 17 17  Temp:    SpO2: 98% 98%    Constitutional: Alert and oriented. Well appearing and in no acute distress. Hard of hearing.  Eyes: Conjunctivae are normal. PERRL. EOMI. Head: Atraumatic. Nose: No congestion/rhinnorhea. Mouth/Throat: Mucous membranes are moist.  Oropharynx non-erythematous. Neck: No stridor. No cervical spine tenderness to palpation. Cardiovascular: Normal rate, regular rhythm. Grossly normal heart sounds.  Good peripheral circulation. Respiratory: Normal respiratory effort.  No retractions. Lungs CTAB. Gastrointestinal: Soft , nondistended, nontender to palpation. No CVA tenderness. Musculoskeletal:  Isolated laceration to the proximal left shin.  Anteriorly and obliquely oriented, about 8 cm in length into the subcutaneous tissue.  Hemostatic with direct pressure.  Left leg is distally neurovascularly intact. Palpation of all 4 extremities otherwise not  evidence of deformity or tenderness. Neurologic:  Normal speech and language. No gross focal neurologic deficits are appreciated.  Skin:  Skin is warm, dry. No rash noted. Psychiatric: Mood and affect are normal. Speech and behavior are normal. ____________________________________________   LABS (all labs ordered are listed, but only abnormal results are displayed)  Labs Reviewed  CBC WITH DIFFERENTIAL/PLATELET - Abnormal; Notable for the following components:      Result Value   RBC 3.78 (*)    Platelets 131 (*)    All other components within normal limits  BASIC METABOLIC PANEL - Abnormal; Notable for the following components:   Sodium 130 (*)    Chloride 96 (*)    Glucose, Bld 495 (*)  BUN 43 (*)    Creatinine, Ser 1.80 (*)    Calcium 8.8 (*)    GFR, Estimated 27 (*)    All other components within normal limits  URINALYSIS, COMPLETE (UACMP) WITH MICROSCOPIC - Abnormal; Notable for the following components:   Color, Urine YELLOW (*)    APPearance TURBID (*)    Glucose, UA >=500 (*)    Hgb urine dipstick MODERATE (*)    Ketones, ur 5 (*)    Protein, ur 100 (*)    Leukocytes,Ua SMALL (*)    WBC, UA >50 (*)    All other components within normal limits  CBG MONITORING, ED - Abnormal; Notable for the following components:   Glucose-Capillary 401 (*)    All other components within normal limits  CBG MONITORING, ED - Abnormal; Notable for the following components:   Glucose-Capillary 378 (*)    All other components within normal limits  URINE CULTURE   ____________________________________________  12 Lead EKG  Sinus rhythm, rate of 67 bpm.  Normal axis.  Left bundle branch block.  No evidence of acute ischemia per S[place] criteria.  Appears quite similar to EKG from 10/2018 upon comparison. ____________________________________________  RADIOLOGY  ED MD interpretation: Plain film of the pelvis, left hip and left tib-fib reviewed by me and demonstrate no evidence of  fracture or dislocation. CT head reviewed by me without evidence of intracranial pathology.  Official radiology report(s): DG Tibia/Fibula Left  Result Date: 02/13/2021 CLINICAL DATA:  Post fall left leg pain.  Laceration over shin. EXAM: LEFT TIBIA AND FIBULA - 2 VIEW COMPARISON:  None. FINDINGS: The cortical margins of the tibia and fibula are intact. There is no evidence of fracture or other focal bone lesions. Knee and ankle alignment are maintained. The bones are subjectively under mineralized. Skin and soft tissue irregularity about the anterolateral mid lower leg. No radiopaque foreign body. IMPRESSION: Skin and soft tissue irregularity about the anterolateral mid lower leg consistent with laceration. No radiopaque foreign body or acute osseous abnormality. Electronically Signed   By: Keith Rake M.D.   On: 02/13/2021 19:31   CT Head Wo Contrast  Result Date: 02/13/2021 CLINICAL DATA:  Head trauma fall EXAM: CT HEAD WITHOUT CONTRAST TECHNIQUE: Contiguous axial images were obtained from the base of the skull through the vertex without intravenous contrast. COMPARISON:  CT brain 11/25/2018 FINDINGS: Brain: No acute territorial infarction, hemorrhage, or intracranial mass. Moderate atrophy. Moderate hypodensity in the white matter consistent with chronic small vessel ischemic change. Stable ventricle size. Vascular: No hyperdense vessels. Vertebral and carotid vascular calcification Skull: Normal. Negative for fracture or focal lesion. Sinuses/Orbits: Patchy mucosal thickening in the ethmoid sinuses Other: None IMPRESSION: 1. No CT evidence for acute intracranial abnormality. 2. Atrophy and chronic small vessel ischemic changes of the white matter. Electronically Signed   By: Donavan Foil M.D.   On: 02/13/2021 19:30   DG Hip Unilat W or Wo Pelvis 2-3 Views Left  Result Date: 02/13/2021 CLINICAL DATA:  Post fall with left leg pain. EXAM: DG HIP (WITH OR WITHOUT PELVIS) 2-3V LEFT COMPARISON:   Pelvis radiograph 02/01/2020 FINDINGS: No acute fracture of the pelvis or left hip. The bones are subjectively under mineralized. The left femoral head is well seated in the acetabulum. Mild osteoarthritis with peripheral spurring. Pubic rami are intact. Pubic symphysis and sacroiliac joints are congruent. Surgical clips in the pelvis. IMPRESSION: No acute fracture of the pelvis or left hip. Electronically Signed   By: Keith Rake  M.D.   On: 02/13/2021 19:28    ____________________________________________   PROCEDURES and INTERVENTIONS  Procedure(s) performed (including Critical Care):  .1-3 Lead EKG Interpretation Performed by: Vladimir Crofts, MD Authorized by: Vladimir Crofts, MD     Interpretation: normal     ECG rate:  68   Ectopy: none     Conduction: normal   Comments:     LBBB .Marland KitchenLaceration Repair  Date/Time: 02/13/2021 10:31 PM Performed by: Vladimir Crofts, MD Authorized by: Vladimir Crofts, MD   Consent:    Consent obtained:  Verbal   Consent given by:  Patient   Risks, benefits, and alternatives were discussed: yes     Risks discussed:  Infection, pain, poor cosmetic result, poor wound healing, retained foreign body and vascular damage   Alternatives discussed:  No treatment Anesthesia:    Anesthesia method:  Local infiltration   Local anesthetic:  Lidocaine 1% WITH epi Laceration details:    Location:  Leg   Leg location:  L lower leg   Length (cm):  8 Exploration:    Limited defect created (wound extended): no     Hemostasis achieved with:  Direct pressure   Imaging obtained: x-ray     Imaging outcome: foreign body not noted     Contaminated: no   Treatment:    Area cleansed with:  Povidone-iodine   Amount of cleaning:  Standard   Irrigation solution:  Sterile saline   Irrigation volume:  1L saline mixed with 1 iodine small bottle   Visualized foreign bodies/material removed: no     Debridement:  None   Undermining:  None   Scar revision: no   Skin repair:     Repair method:  Sutures   Suture size:  3-0   Suture material:  Nylon   Suture technique: 1 horizontal mattress with 7 simple interrupted.   Number of sutures:  8 Approximation:    Approximation:  Close Repair type:    Repair type:  Simple Post-procedure details:    Dressing:  Antibiotic ointment   Procedure completion:  Tolerated well, no immediate complications    Medications  acetaminophen (TYLENOL) tablet 1,000 mg (1,000 mg Oral Given 02/13/21 1859)  lidocaine-EPINEPHrine (XYLOCAINE W/EPI) 2 %-1:100000 (with pres) injection 20 mL (20 mLs Infiltration Given by Other 02/13/21 1919)  lactated ringers bolus 1,000 mL (0 mLs Intravenous Stopped 02/13/21 2030)  insulin aspart (novoLOG) injection 10 Units (10 Units Intravenous Given 02/13/21 1933)  cefTRIAXone (ROCEPHIN) 1 g in sodium chloride 0.9 % 100 mL IVPB (0 g Intravenous Stopped 02/13/21 1952)  Tdap (BOOSTRIX) injection 0.5 mL (0.5 mLs Intramuscular Given 02/13/21 2029)  bacitracin ointment ( Topical Given 02/13/21 2029)  insulin aspart (novoLOG) injection 10 Units (10 Units Intravenous Given 02/13/21 2217)    ____________________________________________   MDM / ED COURSE   87 year old woman with indwelling Foley catheter presents from her SNF after a reported fall, causing injury to her left leg that require suture repair.  Normal vitals on room air.  Exam with isolated laceration to her left anterolateral proximal shin into the subcutaneous tissue.  No evidence of compartment syndrome or additional trauma.  She is in no distress and has no evidence of neurovascular deficits.  Blood work demonstrates hyperglycemia without acidosis and urine demonstrates infectious features, for which she received Rocephin.  Laceration to her leg is cleaned and repaired by me, as above, which was well-tolerated.  Recheck of her glucose demonstrates improvement, although quite hyperglycemic, so will provide additional insulin and recheck  again to ensure  continued improvement.  Anticipate outpatient management thereafter with return to her facility and short course of antibiotics to treat acute cystitis.   Clinical Course as of 02/13/21 2230  Thu Feb 13, 2021  2020 Laceration repair complete, as above.  Patient reports feeling well.  We discussed diagnosis of UTI.  We discussed returning to her facility after glucose recheck.  She is agreeable. [DS]  2227 Blood glucose remains elevated, although improving, will provide additional insulin and recheck after this.  As long as her values are improving, I think she would be suitable for outpatient management and return to her facility. [DS]    Clinical Course User Index [DS] Vladimir Crofts, MD    ____________________________________________   FINAL CLINICAL IMPRESSION(S) / ED DIAGNOSES  Final diagnoses:  Fall, initial encounter  Laceration of left lower extremity, initial encounter  Urinary tract infection associated with indwelling urethral catheter, initial encounter [facility])     ED Discharge Orders         Ordered    cephALEXin (KEFLEX) 500 MG capsule  4 times daily        02/13/21 2022           Ryleigh Esqueda   Note:  This document was prepared using Dragon voice recognition software and may include unintentional dictation errors.   Vladimir Crofts, MD 02/13/21 [phone]

## 2021-02-13 NOTE — ED Notes (Signed)
Ed rn called patients son mark who stated he was her POA , made son aware patient will be discharged and son updated

## 2021-02-13 NOTE — ED Triage Notes (Signed)
PT FELL TODAY, UNWITNESSED, PT ON THINERSS, LEFT LEG INJURY

## 2021-02-13 NOTE — ED Triage Notes (Signed)
PER EMS PTS BLOOD SUGAR OVER 500

## 2021-02-13 NOTE — Discharge Instructions (Addendum)
Pamela [name] had evidence of a UTI and started on a course of antibiotics to treat this.    We repaired the laceration to her left leg, and the stitches will need to be removed in about 7-10 days.  No broken bones beneath it.  Her sugars were pretty high at about 500 when she got here, improving to about 300 after fluids and IV insulin.  Please ensure to give her her typical insulin and diabetic medications, including her nighttime insulin.   In general, keep the area clean and dry. Then apply a Bacitracin/Neosporin type of antibiotic ointment.  Gently wash with warm soap and water once daily, and again if it gets dirty. Don't vigorously scrub at the wound.  Gently pat dry. Once dry, apply Neosporin / bacitracin type antibiotic ointment. It is important to keep the wound moist with an antibiotic ointment, or a small amount of Vaseline, for the first 5 days. Keeping it moist/clean will help the area heal faster and prevent scar tissue formation.  If you are doing anything active, keep it covered.  If you are sitting around at home, you may leave it uncovered.

## 2021-02-13 NOTE — ED Notes (Signed)
Called [facility] to transport patient back to [facility]

## 2021-02-15 LAB — URINE CULTURE

## 2022-02-19 NOTE — Progress Notes (Signed)
03/02/22 ?11:34 AM  ? ?Pamela [name] ?1933/12/29 ?[id number] ? ?Referring provider:  ?[facility] ?[address] [place],  [place] [address] ? ?Chief Complaint  ?Patient presents with  ? Recurrent UTI  ? ? ?Urological history ?Repeated Foley dislodgement  ?- Resides at an assisted living facility, her foley catheter kept coming out  ?- Previously tried on SPT but also continued to be dislodged.  ? ?2. Urinary retention ?-see #1 ?-PVR 0 mL  ? ? ?HPI: ?Pamela [name] is a 88 y.o.female who presents today for further evaluation of UTI symptoms.  ? ?Patient is by herself and is a poor historian.  No records were sent with patient regarding her urinary issues.  The papers that were sent with her contained her MAR, vitals and BS, but they also listed disorder of the prostate, so I cannot be sure they are her records.  ? ?My staff called the facility and they stated she had a positive urine culture for E.coli on 02/04/2022 and it was treated with Macrobid and a second positive urine culture positive for E.coli on 02/13/2022 for which Macrobid was again prescribe and she finished the prescription yesterday.  ? ?She reports that her bladder is causing her pain that has been ongoing.  She states it is burning on the outside.   ? ?Patient denies any modifying or aggravating factors.  Patient denies any gross hematuria. Patient denies any fevers, chills, nausea or vomiting.  ? ?She is also unable to stand without assistance and she was sent to [place] wearing socks only, so we could not perform an exam.   ? ?PMH: ?Past Medical History:  ?Diagnosis Date  ? A-fib ([place]) 08/28/2014  ? Overview:  Overview:  Diastolic dysfunction on ECHO 04/2012-done at [facility]   ? Abnormal gait 02/08/2012  ? Abscess or cellulitis of leg 04/18/2012  ? Absence of bladder continence 07/29/2012  ? Acid reflux 02/08/2012  ? Acute kidney failure ([place]) 05/15/2012  ? Afib, paroxysmal, with RVR at times with BBB 05/13/2012  ? Allergic drug rash 05/15/2012  ?  Anemia 05/13/2012  ? 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 Benign essential HTN 02/08/2012  ? Bladder infection, chronic 06/29/2013  ? BP (high blood pressure) 05/12/2012  ? Bursitis of knee 08/07/2014  ? Cellulitis in diabetic foot ([place]) 05/12/2012  ? CHF (congestive heart failure) ([place])   ? Chronic kidney disease 06/01/2012  ? Overview:  Overview:   12/2012- Renal US, simple L cyst. UIEP normal   ? Clinical depression 08/28/2014  ? CN (constipation) 03/17/2012  ? Dermatitis due to drug reaction 05/15/2012  ? Diabetes mellitus   ? Diastolic dysfunction, left ventricle 05/14/2012  ? DM (diabetes mellitus) ([place]) 05/12/2012  ? Dysrhythmia   ? Dysuria   ? Edema leg 07/02/2014  ? Enthesopathy of knee 08/07/2014  ? Excess fluid volume 05/20/2012  ? FOM (frequency of micturition) 07/29/2012  ? Pilar Plate hematuria 09/27/2014  ? Gangrene ([place])   ? GERD (gastroesophageal reflux disease)   ? Heart disease   ? High cholesterol   ? HTN (hypertension) 05/12/2012  ? Hypercholesterolemia 05/12/2012  ? Hypertension   ? Incomplete bladder emptying 07/29/2012  ? Left bundle branch block (LBBB)   ? Open wound of knee, leg (except thigh), and ankle 06/01/2012  ? 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 Overview:  Overview:  Several years ago, allergic reaction to sulfa antibiotic Several years ago, allergic reaction to sulfa antibiotic   ? ? ?Surgical History: ?Past Surgical History:  ?  Procedure Laterality Date  ? ABDOMINAL HYSTERECTOMY    ? BLADDER SURGERY    ? cataract surgery    ? ENDOVENOUS ABLATION SAPHENOUS VEIN W/ LASER    ? LEFT HEART CATH AND CORONARY ANGIOGRAPHY N/A 11/29/2018  ? Procedure: LEFT HEART CATH AND CORONARY ANGIOGRAPHY;  Surgeon: Corey Skains, MD;  Location: [facility];  Service: Cardiovascular;  Laterality: N/A;  ? NASAL SINUS SURGERY    ? TOE AMPUTATION    ? TONSILLECTOMY    ? ? ?Home Medications:  ?Allergies as of 02/20/2022   ? ?   Reactions  ? Ciprofloxacin Anaphylaxis  ? Sulfamethoxazole-trimethoprim Other (See Comments), Rash   ? Went into [employer].  ? Augmentin [amoxicillin-pot Clavulanate] Hives  ? Ampicillin   ? Hydrochlorothiazide   ? Levofloxacin Other (See Comments)  ? Losartan Potassium-hctz Other (See Comments)  ? Other reaction(s): Unknown  ? Nitrofurantoin Other (See Comments)  ? Sulfa Antibiotics Other (See Comments)  ? Pamela [name] ?[employer] Johnsons  ? Sulfasalazine   ? unknown  ? Levaquin [levofloxacin In D5w]   ? ?  ? ?  ?Medication List  ?  ? ?  ? Accurate as of February 20, 2022 11:59 PM. If you have any questions, ask your nurse or doctor.  ?  ?  ? ?  ? ?amiodarone 200 MG tablet ?Commonly known as: PACERONE ?Take 200 mg by mouth daily. ?  ?amitriptyline 50 MG tablet ?Commonly known as: ELAVIL ?Take 50 mg by mouth at bedtime. ?  ?amLODipine 5 MG tablet ?Commonly known as: NORVASC ?Take 5 mg by mouth daily. ?  ?atorvastatin 40 MG tablet ?Commonly known as: LIPITOR ?Take 40 mg by mouth daily. ?  ?bisacodyl 5 MG EC tablet ?Commonly known as: bisacodyl ?Take 1 tablet (5 mg total) by mouth daily as needed for moderate constipation. ?  ?cephALEXin 500 MG capsule ?Commonly known as: KEFLEX ?Take 1 capsule (500 mg total) by mouth 2 (two) times daily. ?  ?clopidogrel 75 MG tablet ?Commonly known as: PLAVIX ?Take 1 tablet (75 mg total) by mouth daily. ?  ?Glucagon Emergency 1 MG Kit ?  ?hydrocortisone 1 % ointment ?Apply 1 application topically 2 (two) times daily. Please apply liberally to your vulva twice a day to help prevent further irritation ?  ?insulin lispro protamine-lispro (75-25) 100 UNIT/ML Susp injection ?Commonly known as: HUMALOG 75/25 MIX ?Inject 20 Units into the skin daily. ?  ?insulin lispro protamine-lispro (75-25) 100 UNIT/ML Susp injection ?Commonly known as: HUMALOG 75/25 MIX ?Inject 18 Units into the skin at bedtime. ?  ?memantine 10 MG tablet ?Commonly known as: NAMENDA ?Take 10 mg by mouth 2 (two) times daily. ?  ?metoprolol tartrate 25 MG tablet ?Commonly known as: LOPRESSOR ?Take 25 mg by  mouth 2 (two) times daily. ?  ?nitroGLYCERIN 0.4 MG SL tablet ?Commonly known as: NITROSTAT ?Place 1 tablet (0.4 mg total) under the tongue every 5 (five) minutes as needed for chest pain. ?  ?NovoLOG Mix 70/30 FlexPen (70-30) 100 UNIT/ML FlexPen ?Generic drug: insulin aspart protamine - aspart ?Inject 0.12 mLs (12 Units total) into the skin 2 (two) times daily with a meal. ?  ?Olopatadine HCl 0.2 % Soln ?  ?pantoprazole 40 MG tablet ?Commonly known as: PROTONIX ?Take 40 mg by mouth daily. ?  ?Premarin vaginal cream ?Generic drug: conjugated estrogens ?Place 1 Applicatorful vaginally daily. Apply 0.80m (pea-sized amount)  just inside the vaginal introitus with a finger-tip on  Monday, Wednesday and Friday nights. ?  Started by: Zara Council, PA-C ?  ?torsemide 20 MG tablet ?Commonly known as: DEMADEX ?Take 20 mg by mouth daily. ?  ?traMADol 50 MG tablet ?Commonly known as: ULTRAM ?Take 50 mg by mouth 2 (two) times daily as needed. ?  ?vitamin B-12 1000 MCG tablet ?Commonly known as: CYANOCOBALAMIN ?Take 1,000 mcg by mouth daily. ?  ? ?  ? ? ?Allergies:  ?Allergies  ?Allergen Reactions  ? Ciprofloxacin Anaphylaxis  ? Sulfamethoxazole-Trimethoprim Other (See Comments) and Rash  ?  Went into [employer].  ? Augmentin [Amoxicillin-Pot Clavulanate] Hives  ? Ampicillin   ? Hydrochlorothiazide   ? Levofloxacin Other (See Comments)  ? Losartan Potassium-Hctz Other (See Comments)  ?  Other reaction(s): Unknown  ? Nitrofurantoin Other (See Comments)  ? Sulfa Antibiotics Other (See Comments)  ?  Pamela [name] ?Steven[employer] Johnsons  ? Sulfasalazine   ?  unknown  ? Levaquin [Levofloxacin In D5w]   ? ? ?Family History: ?Family History  ?Problem Relation Age of Onset  ? Diabetes Mellitus II Mother   ? Heart disease Mother   ? Lung cancer Father   ? Diabetes Paternal Grandmother   ? Diabetes Maternal Grandmother   ? Kidney disease Neg Hx   ? Prostate cancer Neg Hx   ? Bladder Cancer Neg Hx   ? ? ?Social History:  reports  that she has never smoked. She has never used smokeless tobacco. She reports that she does not drink alcohol and does not use drugs. ? ? ?Physical Exam: ?BP 109/71   Pulse 99   ?Constitutional:  Well no

## 2022-02-20 ENCOUNTER — Other Ambulatory Visit: Payer: Self-pay

## 2022-02-20 ENCOUNTER — Encounter: Payer: Self-pay | Admitting: Urology

## 2022-02-20 ENCOUNTER — Ambulatory Visit (INDEPENDENT_AMBULATORY_CARE_PROVIDER_SITE_OTHER): Payer: Medicare Other | Admitting: Urology

## 2022-02-20 VITALS — BP 109/71 | HR 99

## 2022-02-20 DIAGNOSIS — R339 Retention of urine, unspecified: Secondary | ICD-10-CM | POA: Diagnosis not present

## 2022-02-20 DIAGNOSIS — N952 Postmenopausal atrophic vaginitis: Secondary | ICD-10-CM | POA: Diagnosis not present

## 2022-02-20 DIAGNOSIS — R3989 Other symptoms and signs involving the genitourinary system: Secondary | ICD-10-CM

## 2022-02-20 MED ORDER — PREMARIN 0.625 MG/GM VA CREA: 1.0000 | TOPICAL_CREAM | Freq: Every day | VAGINAL | 12 refills | Status: DC

## 2022-03-02 LAB — BLADDER SCAN AMB NON-IMAGING

## 2022-03-11 ENCOUNTER — Emergency Department: Payer: Medicare Other

## 2022-03-11 ENCOUNTER — Encounter: Payer: Self-pay | Admitting: Emergency Medicine

## 2022-03-11 ENCOUNTER — Emergency Department
Admission: EM | Admit: 2022-03-11 | Discharge: 2022-03-11 | Disposition: A | Discharge status: Home / Self Care | Payer: Medicare Other | Attending: Emergency Medicine | Admitting: Emergency Medicine

## 2022-03-11 ENCOUNTER — Other Ambulatory Visit: Payer: Self-pay

## 2022-03-11 DIAGNOSIS — E119 Type 2 diabetes mellitus without complications: Secondary | ICD-10-CM | POA: Diagnosis not present

## 2022-03-11 DIAGNOSIS — N3000 Acute cystitis without hematuria: Secondary | ICD-10-CM | POA: Insufficient documentation

## 2022-03-11 DIAGNOSIS — R4182 Altered mental status, unspecified: Secondary | ICD-10-CM | POA: Diagnosis present

## 2022-03-11 DIAGNOSIS — I10 Essential (primary) hypertension: Secondary | ICD-10-CM | POA: Diagnosis not present

## 2022-03-11 DIAGNOSIS — I4891 Unspecified atrial fibrillation: Secondary | ICD-10-CM | POA: Insufficient documentation

## 2022-03-11 DIAGNOSIS — G9341 Metabolic encephalopathy: Secondary | ICD-10-CM | POA: Diagnosis not present

## 2022-03-11 DIAGNOSIS — Z7901 Long term (current) use of anticoagulants: Secondary | ICD-10-CM | POA: Insufficient documentation

## 2022-03-11 LAB — URINALYSIS, COMPLETE (UACMP) WITH MICROSCOPIC
Bilirubin Urine: NEGATIVE
Glucose, UA: NEGATIVE mg/dL
Hgb urine dipstick: NEGATIVE
Ketones, ur: 5 mg/dL — AB
Nitrite: NEGATIVE
Protein, ur: >=300 mg/dL — AB
Specific Gravity, Urine: 1.017 (ref 1.005–1.030)
WBC, UA: >50 WBC/hpf — ABNORMAL HIGH (ref 0–5)
pH: 7 (ref 5.0–8.0)

## 2022-03-11 LAB — COMPREHENSIVE METABOLIC PANEL
ALT: 45 U/L — ABNORMAL HIGH (ref 0–44)
AST: 49 U/L — ABNORMAL HIGH (ref 15–41)
Albumin: 3.1 g/dL — ABNORMAL LOW (ref 3.5–5.0)
Alkaline Phosphatase: 126 U/L (ref 38–126)
Anion gap: 11 (ref 5–15)
BUN: 37 mg/dL — ABNORMAL HIGH (ref 8–23)
CO2: 25 mmol/L (ref 22–32)
Calcium: 9.4 mg/dL (ref 8.9–10.3)
Chloride: 105 mmol/L (ref 98–111)
Creatinine, Ser: 1.45 mg/dL — ABNORMAL HIGH (ref 0.44–1.00)
GFR, Estimated: 35 mL/min — ABNORMAL LOW (ref >60)
Glucose, Bld: 216 mg/dL — ABNORMAL HIGH (ref 70–99)
Potassium: 3.9 mmol/L (ref 3.5–5.1)
Sodium: 141 mmol/L (ref 135–145)
Total Bilirubin: 0.9 mg/dL (ref 0.3–1.2)
Total Protein: 6.9 g/dL (ref 6.5–8.1)

## 2022-03-11 LAB — CBC WITH DIFFERENTIAL/PLATELET
Abs Immature Granulocytes: 0.02 10*3/uL (ref 0.00–0.07)
Basophils Absolute: 0 10*3/uL (ref 0.0–0.1)
Basophils Relative: 1 %
Eosinophils Absolute: 0.1 10*3/uL (ref 0.0–0.5)
Eosinophils Relative: 1 %
HCT: 43.1 % (ref 36.0–46.0)
Hemoglobin: 13.7 g/dL (ref 12.0–15.0)
Immature Granulocytes: 0 %
Lymphocytes Relative: 17 %
Lymphs Abs: 1.2 10*3/uL (ref 0.7–4.0)
MCH: 32 pg (ref 26.0–34.0)
MCHC: 31.8 g/dL (ref 30.0–36.0)
MCV: 100.7 fL — ABNORMAL HIGH (ref 80.0–100.0)
Monocytes Absolute: 0.4 10*3/uL (ref 0.1–1.0)
Monocytes Relative: 6 %
Neutro Abs: 5.2 10*3/uL (ref 1.7–7.7)
Neutrophils Relative %: 75 %
Platelets: 181 10*3/uL (ref 150–400)
RBC: 4.28 MIL/uL (ref 3.87–5.11)
RDW: 13.2 % (ref 11.5–15.5)
WBC: 6.9 10*3/uL (ref 4.0–10.5)
nRBC: 0 % (ref 0.0–0.2)

## 2022-03-11 LAB — APTT: aPTT: 26 seconds (ref 24–36)

## 2022-03-11 LAB — PROCALCITONIN: Procalcitonin: <0.1 ng/mL

## 2022-03-11 LAB — PROTIME-INR
INR: 1.1 (ref 0.8–1.2)
Prothrombin Time: 13.9 seconds (ref 11.4–15.2)

## 2022-03-11 LAB — LACTIC ACID, PLASMA: Lactic Acid, Venous: 1.5 mmol/L (ref 0.5–1.9)

## 2022-03-11 MED ORDER — SODIUM CHLORIDE 0.9 % IV BOLUS (SEPSIS)
1000.0000 mL | Freq: Once | INTRAVENOUS | Status: AC
  Administered 2022-03-11: 1000 mL via INTRAVENOUS

## 2022-03-11 MED ORDER — CEPHALEXIN 500 MG PO CAPS
500.0000 mg | ORAL_CAPSULE | Freq: Four times a day (QID) | ORAL | 0 refills | Status: AC
Start: 2022-03-11 — End: 2022-03-16

## 2022-03-11 MED ORDER — CEFTRIAXONE SODIUM 1 G IJ SOLR
1.0000 g | Freq: Once | INTRAMUSCULAR | Status: AC
  Administered 2022-03-11: 1 g via INTRAVENOUS
  Filled 2022-03-11: qty 10

## 2022-03-11 NOTE — ED Notes (Signed)
Patient straight cath for urine sample. Purewick placed at this time. ?

## 2022-03-11 NOTE — ED Notes (Signed)
Called [facility] for transport to [facility]  [phone] ?

## 2022-03-11 NOTE — ED Triage Notes (Signed)
Patient to ED via [facility] from [facility] for AMS. Patient is normally alert and oriented but hard of hearing. Facility concerned for UTI. ?

## 2022-03-11 NOTE — Discharge Instructions (Addendum)
Pamela [name] does have a UTI.  She received a dose of ceftriaxone IV antibiotics in the emergency room that would last about 24 hours. ? ?Please start the Keflex antibiotic tomorrow (Thursday 4/13) morning and provide 4 times daily. ?

## 2022-03-11 NOTE — ED Provider Notes (Signed)
? ?[facility] ?Provider Note ? ? ? Event Date/Time  ? First MD Initiated Contact with Patient 03/11/22 1022   ?  (approximate) ? ? ?History  ? ?Altered Mental Status ? ? ?HPI ? ?Pamela [name] is a 88 y.o. female who presents to the ED for evaluation of Altered Mental Status ?  ?I reviewed outpatient SNF documentation from 08/2020.  History of DM, HTN, HLD, atrial fibrillation.  Plavix without anticoagulation. ? ?She presents to the ED via EMS from her SNF for evaluation of altered mentation and explicit concern for a UTI by the family.  She is typically alert and oriented, but has been more confused and disoriented today. ? ?Patient reports she feels okay but does not know why she is here.  Has no complaints. ? ? ?Physical Exam  ? ?Triage Vital Signs: ?ED Triage Vitals  ?Enc Vitals Group  ?   BP 03/11/22 1030 (!) 145/85  ?   Pulse Rate 03/11/22 1030 93  ?   Resp 03/11/22 1030 16  ?   Temp 03/11/22 1030 (!) 97.4 ?F (36.3 ?C)  ?   Temp Source 03/11/22 1030 Oral  ?   SpO2 03/11/22 1024 99 %  ?   Weight 03/11/22 1028 180 lb 12.4 oz (82 kg)  ?   Height 03/11/22 1028 5\' 7"  (1.702 m)  ?   Head Circumference --   ?   [facility] --   ?   Pain Score 03/11/22 1028 0  ?   Pain Loc --   ?   Pain Edu? --   ?   Excl. in [place]? --   ? ? ?Most recent vital signs: ?Vitals:  ? 03/11/22 1115 03/11/22 1130  ?BP: (!) 136/96 (!) 124/106  ?Pulse: 98 91  ?Resp: 14 14  ?Temp:    ?SpO2: 96% 99%  ? ? ?General: Awake, no distress.  Pleasantly disoriented.  Follows commands in all 4.  Dry mucous membranes. ?CV:  Good peripheral perfusion. RRR ?Resp:  Normal effort.  ?Abd:  No distention.  Suprapubic tenderness, otherwise benign abdomen.  No peritoneal features or CVA tenderness. ?MSK:  No deformity noted.  ?Neuro:  No focal deficits appreciated. Cranial nerves II through XII intact ?5/5 strength and sensation in all 4 extremities ?Other:   ? ? ?ED Results / Procedures / Treatments  ? ?Labs ?(all labs ordered are listed, but  only abnormal results are displayed) ?Labs Reviewed  ?COMPREHENSIVE METABOLIC PANEL - Abnormal; Notable for the following components:  ?    Result Value  ? Glucose, Bld 216 (*)   ? BUN 37 (*)   ? Creatinine, Ser 1.45 (*)   ? Albumin 3.1 (*)   ? AST 49 (*)   ? ALT 45 (*)   ? GFR, Estimated 35 (*)   ? All other components within normal limits  ?CBC WITH DIFFERENTIAL/PLATELET - Abnormal; Notable for the following components:  ? MCV 100.7 (*)   ? All other components within normal limits  ?URINALYSIS, COMPLETE (UACMP) WITH MICROSCOPIC - Abnormal; Notable for the following components:  ? Color, Urine YELLOW (*)   ? APPearance TURBID (*)   ? Ketones, ur 5 (*)   ? Protein, ur >=300 (*)   ? Leukocytes,Ua TRACE (*)   ? WBC, UA >50 (*)   ? Bacteria, UA MANY (*)   ? All other components within normal limits  ?CULTURE, BLOOD (ROUTINE X 2)  ?CULTURE, BLOOD (ROUTINE X 2)  ?URINE CULTURE  ?  LACTIC ACID, PLASMA  ?PROTIME-INR  ?APTT  ?LACTIC ACID, PLASMA  ?PROCALCITONIN  ? ? ?EKG ?Tremulous EKG seems to demonstrate sinus rhythm with a rate of 91 bpm.  Left bundle morphology without STEMI prescribers or criteria.  Fairly poor quality. ? ?RADIOLOGY ?CXR reviewed by me without evidence of acute cardiopulmonary pathology. ?CT head reviewed by me without evidence of acute intracranial pathology ? ?Official radiology report(s): ?CT HEAD WO CONTRAST (5MM) ? ?Result Date: 03/11/2022 ?CLINICAL DATA:  Altered mental status EXAM: CT HEAD WITHOUT CONTRAST TECHNIQUE: Contiguous axial images were obtained from the base of the skull through the vertex without intravenous contrast. RADIATION DOSE REDUCTION: This exam was performed according to the departmental dose-optimization program which includes automated exposure control, adjustment of the mA and/or kV according to patient size and/or use of iterative reconstruction technique. COMPARISON:  Head CT dated February 13, 2021 FINDINGS: Brain: Atrophy and chronic white matter ischemic change. No  evidence of acute infarction, hemorrhage, hydrocephalus, extra-axial collection or mass lesion/mass effect. Vascular: No hyperdense vessel or unexpected calcification. Skull: Normal. Negative for fracture or focal lesion. Sinuses/Orbits: No acute finding. Other: None. IMPRESSION: No acute intracranial abnormality. Electronically Signed   By: Yetta Glassman M.D.   On: 03/11/2022 11:13  ? ?DG Chest Port 1 View ? ?Result Date: 03/11/2022 ?CLINICAL DATA:  Questionable sepsis - evaluate for abnormality EXAM: PORTABLE CHEST 1 VIEW COMPARISON:  11/28/2018 FINDINGS: No consolidation or edema. No pleural effusion or pneumothorax. Stable cardiomediastinal contours with normal heart size. IMPRESSION: No acute process in the chest. Electronically Signed   By: Macy Mis M.D.   On: 03/11/2022 11:03   ? ?PROCEDURES and INTERVENTIONS: ? ?.1-3 Lead EKG Interpretation ?Performed by: Vladimir Crofts, MD ?Authorized by: Vladimir Crofts, MD  ? ?  Interpretation: normal   ?  ECG rate:  90 ?  ECG rate assessment: normal   ?  Rhythm: sinus rhythm   ?  Ectopy: none   ?  Conduction: normal   ? ?Medications  ?cefTRIAXone (ROCEPHIN) 1 g in sodium chloride 0.9 % 100 mL IVPB (1 g Intravenous New Bag/Given 03/11/22 1147)  ?sodium chloride 0.9 % bolus 1,000 mL (1,000 mLs Intravenous New Bag/Given 03/11/22 1146)  ? ? ? ?IMPRESSION / MDM / ASSESSMENT AND PLAN / ED COURSE  ?I reviewed the triage vital signs and the nursing notes. ? ?Pleasant 88 year old woman presents from her SNF with nonfocal encephalopathy, likely metabolic encephalopathy in the setting of a UTI and suitable for initiating antibiotics and returning to SNF.  Reassuring vital signs.  Exam with dry mucous membranes and suprapubic tenderness without peritoneal features.  Nonfocal exam without evidence of distress, trauma or focal deficits.  Blood work is reassuring with CKD at baseline and normal WBC.  No evidence of sepsis and no SIRS criteria.  Urinalysis with infectious features and  concerning for cystitis in this clinical picture.  Provided IV fluids and ceftriaxone, and she is subsequently tolerating p.o. intake.  I considered medical admission for this patient due to her encephalopathy, but she already has close observation at her SNF and I think a trial of outpatient management would be reasonable.  We will discharge back to SNF for course of Keflex after her Rocephin. ? ?Clinical Course as of 03/11/22 1220  ?Wed Mar 11, 2022  ?1147 Reassessed.  Resting comfortably.  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  Has no complaints.  Is requesting something to snack on [DS]  ?  ?Clinical Course User Index ?[DS] Vladimir Crofts, MD  ? ? ? ?  FINAL CLINICAL IMPRESSION(S) / ED DIAGNOSES  ? ?Final diagnoses:  ?Acute cystitis without hematuria  ?Acute metabolic encephalopathy  ? ? ? ?Rx / DC Orders  ? ?ED Discharge Orders   ? ?      Ordered  ?  cephALEXin (KEFLEX) 500 MG capsule  4 times daily       ? 03/11/22 1204  ? ?  ?  ? ?  ? ? ? ?Note:  This document was prepared using Dragon voice recognition software and may include unintentional dictation errors. ?  ?Vladimir Crofts, MD ?03/11/22 1223 ? ?

## 2022-03-12 LAB — URINE CULTURE

## 2022-03-16 LAB — CULTURE, BLOOD (ROUTINE X 2)
Culture: NO GROWTH
Culture: NO GROWTH
Special Requests: ADEQUATE

## 2022-03-17 IMAGING — CR DG ANKLE COMPLETE 3+V*R*
1 series · 3 of 3 positions shown · non-contrast
Comparison: Radiographs dated 06/10/2019

CLINICAL DATA: Pain and swelling since a fall 2 days ago.

EXAM:
RIGHT ANKLE - COMPLETE 3+ VIEW

[Series 1: dg ankle complete right · 0.14mm/px · 3 of 3 slices shown]
[im 1/3]
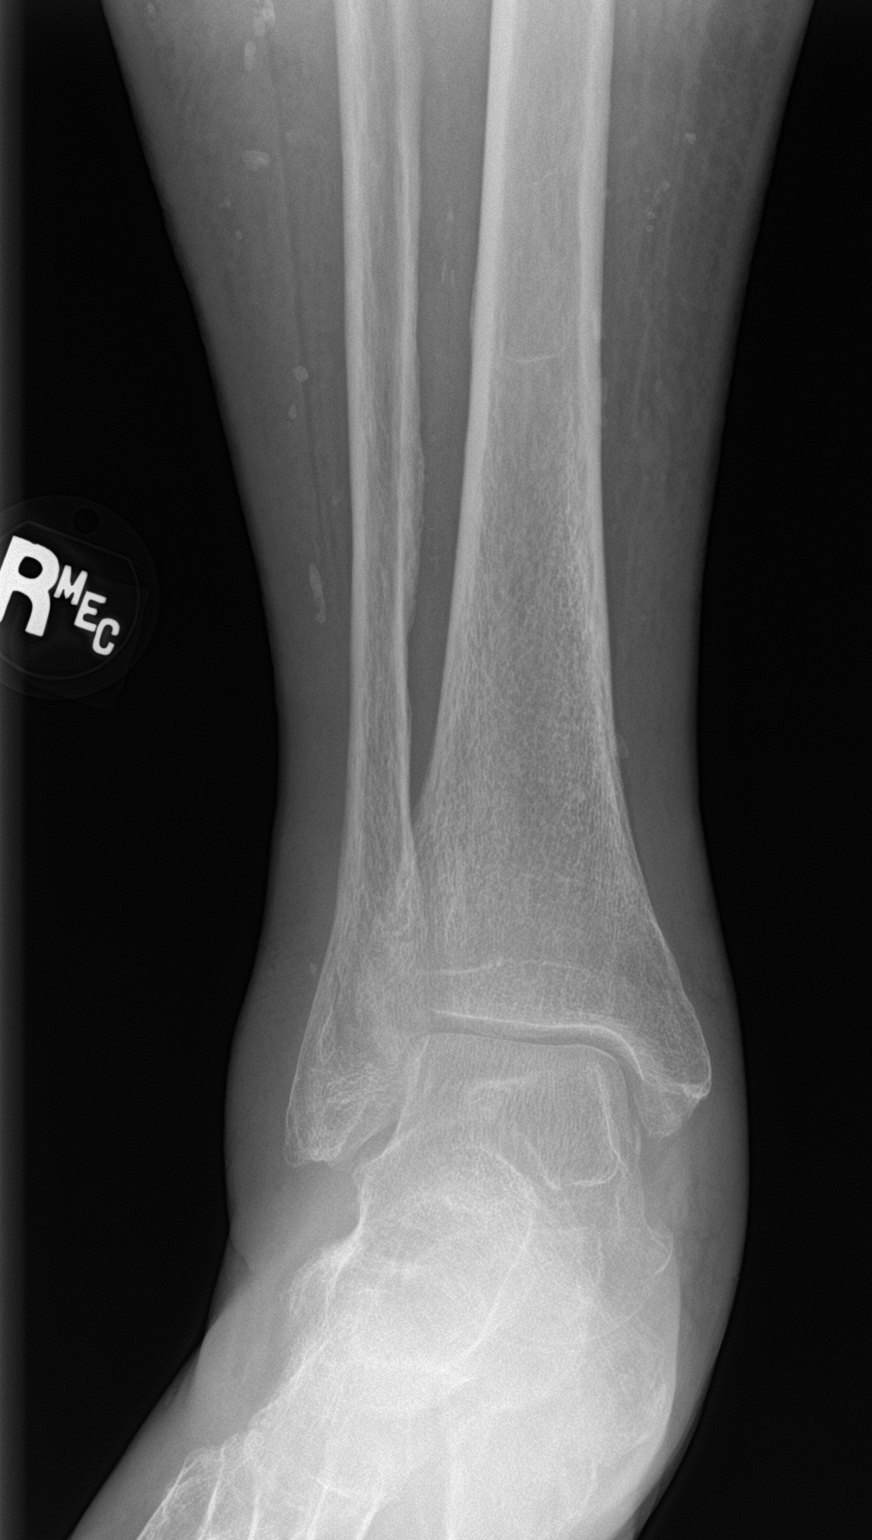
[im 2/3]
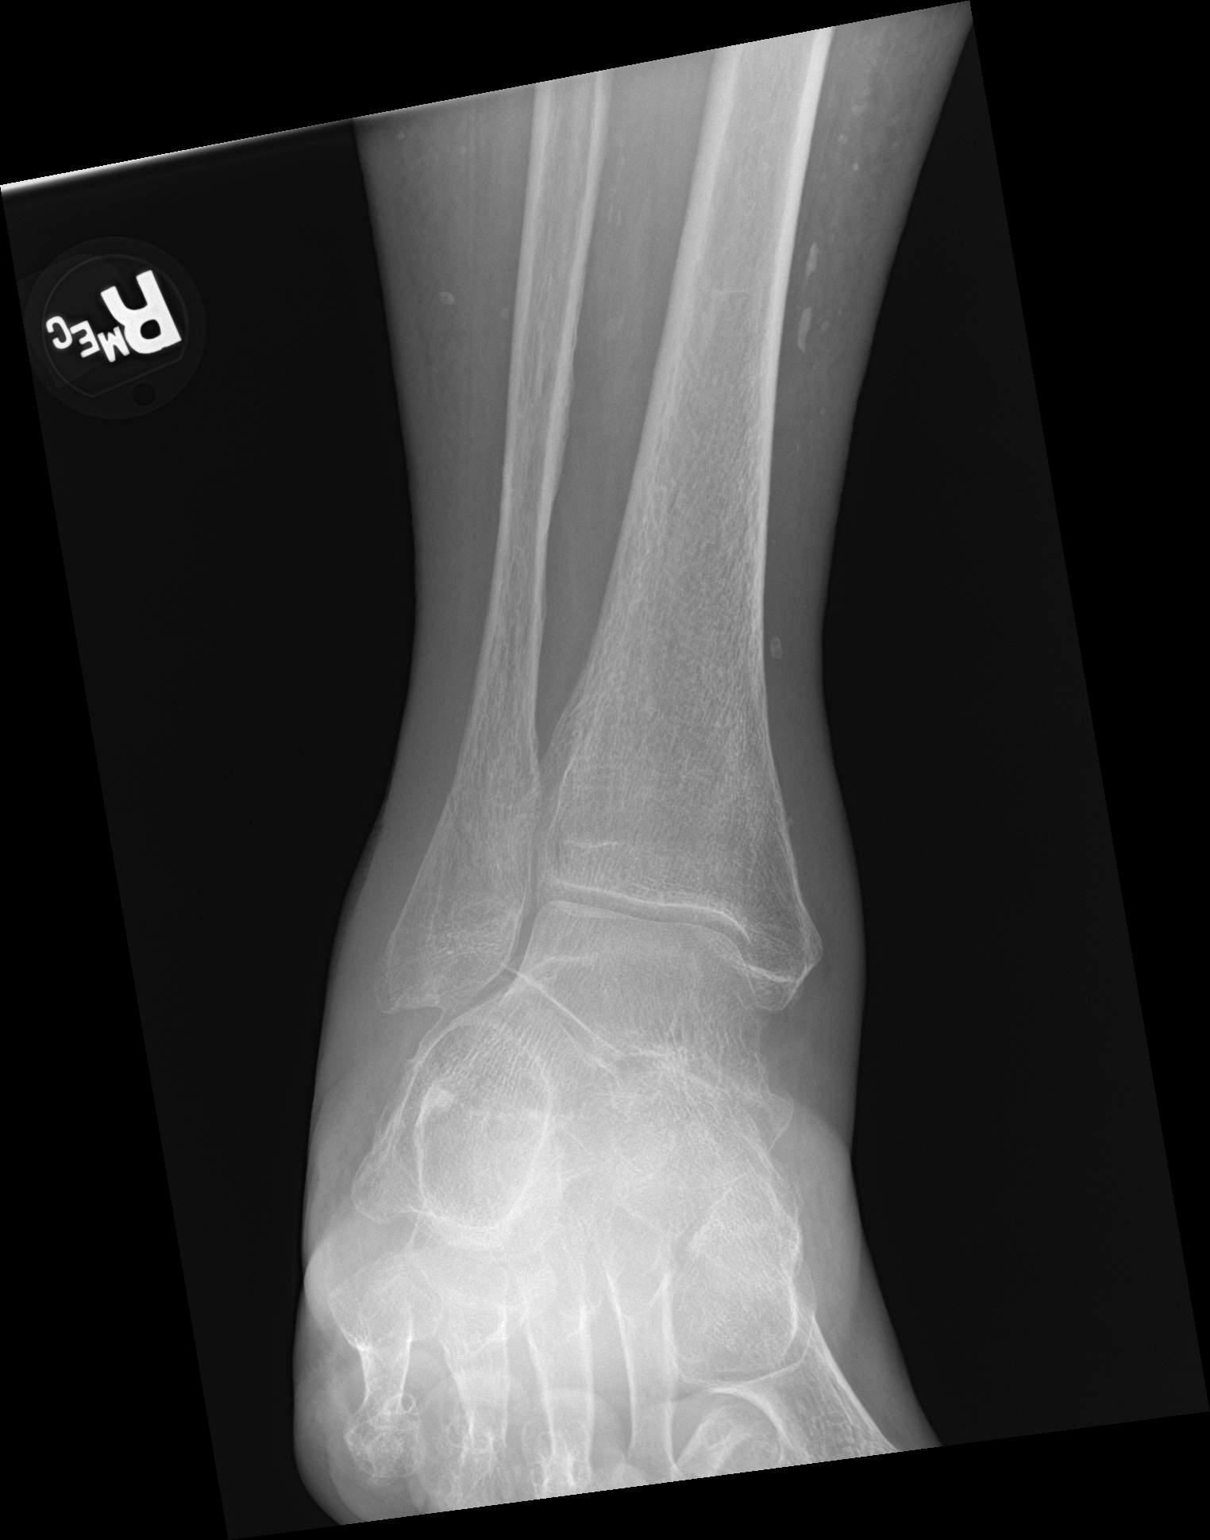
[im 3/3]
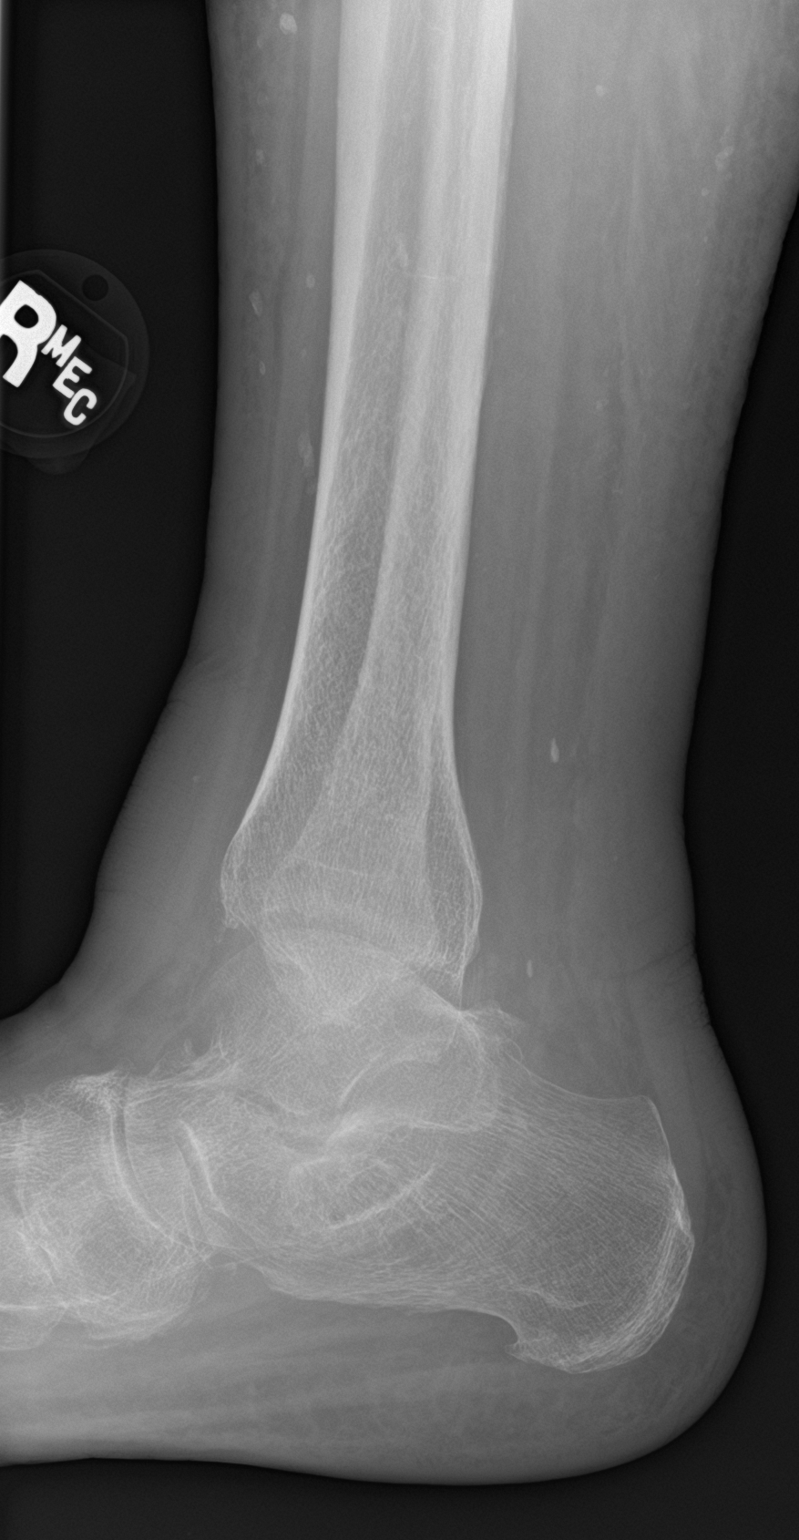

[3 of 3 positions shown; findings below may reference images not displayed]

FINDINGS: There is a nondisplaced spiral fracture of the distal fibula at the
lateral malleolus. No other acute bone abnormality. Soft tissue
swelling around the ankle. Degenerative changes in the midfoot.
IMPRESSION: Nondisplaced spiral fracture of the distal fibula.

## 2022-03-23 NOTE — Progress Notes (Signed)
03/26/22 ?3:03 PM  ? ?[name] ?12/31/1933 ?[id number] ? ?Referring provider:  ?Baxter Hire, MD ?[address] ?[place],  [place] [address] ? ?Chief Complaint  ?Patient presents with  ? Vaginal Atrophy  ? ?Urological history ?Repeated Foley dislodgement  ?- Resides at an assisted living facility, her foley catheter kept coming out  ?- Previously tried on SPT but also continued to be dislodged.  ? ?2. Urinary retention ?-see #1 ?-PVR 0 mL  ? ? ?HPI: ?Pamela [name] is a 88 y.o.female who presents today for further evaluation of UTI symptoms with her son, Pamela [name].  ? ?She had been seen in the ED on March 11, 2022 since we have last seen her for possible UTI.  Urine culture grew out multiple species. ? ?She states today that she is having pain on the outside with urination.  She is a difficult historian.  Patient denies any modifying or aggravating factors.  Patient denies any gross hematuria, dysuria or suprapubic/flank pain.  Patient denies any fevers, chills, nausea or vomiting.   ? ?CATH UA 6-10 WBCs and many bacteria ? ?PMH: ?Past Medical History:  ?Diagnosis Date  ? A-fib ([place]) 08/28/2014  ? Overview:  Overview:  Diastolic dysfunction on ECHO 04/2012-done at [facility]   ? Abnormal gait 02/08/2012  ? Abscess or cellulitis of leg 04/18/2012  ? Absence of bladder continence 07/29/2012  ? Acid reflux 02/08/2012  ? Acute kidney failure ([place]) 05/15/2012  ? Afib, paroxysmal, with RVR at times with BBB 05/13/2012  ? Allergic drug rash 05/15/2012  ? Anemia 05/13/2012  ? 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 Benign essential HTN 02/08/2012  ? Bladder infection, chronic 06/29/2013  ? BP (high blood pressure) 05/12/2012  ? Bursitis of knee 08/07/2014  ? Cellulitis in diabetic foot ([place]) 05/12/2012  ? CHF (congestive heart failure) ([place])   ? Chronic kidney disease 06/01/2012  ? Overview:  Overview:   12/2012- Renal US, simple L cyst. UIEP normal   ? Clinical depression 08/28/2014  ? CN (constipation) 03/17/2012  ? Dermatitis due to drug  reaction 05/15/2012  ? Diabetes mellitus   ? Diastolic dysfunction, left ventricle 05/14/2012  ? DM (diabetes mellitus) ([place]) 05/12/2012  ? Dysrhythmia   ? Dysuria   ? Edema leg 07/02/2014  ? Enthesopathy of knee 08/07/2014  ? Excess fluid volume 05/20/2012  ? FOM (frequency of micturition) 07/29/2012  ? Pilar Plate hematuria 09/27/2014  ? Gangrene ([place])   ? GERD (gastroesophageal reflux disease)   ? Heart disease   ? High cholesterol   ? HTN (hypertension) 05/12/2012  ? Hypercholesterolemia 05/12/2012  ? Hypertension   ? Incomplete bladder emptying 07/29/2012  ? Left bundle branch block (LBBB)   ? Open wound of knee, leg (except thigh), and ankle 06/01/2012  ? 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 Overview:  Overview:  Several years ago, allergic reaction to sulfa antibiotic Several years ago, allergic reaction to sulfa antibiotic   ? ? ?Surgical History: ?Past Surgical History:  ?Procedure Laterality Date  ? ABDOMINAL HYSTERECTOMY    ? BLADDER SURGERY    ? cataract surgery    ? ENDOVENOUS ABLATION SAPHENOUS VEIN W/ LASER    ? LEFT HEART CATH AND CORONARY ANGIOGRAPHY N/A 11/29/2018  ? Procedure: LEFT HEART CATH AND CORONARY ANGIOGRAPHY;  Surgeon: Corey Skains, MD;  Location: [facility];  Service: Cardiovascular;  Laterality: N/A;  ? NASAL SINUS SURGERY    ? TOE AMPUTATION    ? TONSILLECTOMY    ? ? ?  Home Medications:  ?Allergies as of 03/24/2022   ? ?   Reactions  ? Ciprofloxacin Anaphylaxis  ? Sulfamethoxazole-trimethoprim Other (See Comments), Rash  ? Went into [employer].  ? Augmentin [amoxicillin-pot Clavulanate] Hives  ? Ampicillin   ? Hydrochlorothiazide   ? Levofloxacin Other (See Comments)  ? Losartan Potassium-hctz Other (See Comments)  ? Other reaction(s): Unknown  ? Nitrofurantoin Other (See Comments)  ? Sulfa Antibiotics Other (See Comments)  ? Kathreen Cosier ?[facility] [facility]  ? Sulfasalazine   ? unknown  ? Levaquin [levofloxacin In D5w]   ? ?  ? ?  ?Medication List  ?  ? ?  ? Accurate as  of March 24, 2022 11:59 PM. If you have any questions, ask your nurse or doctor.  ?  ?  ? ?  ? ?amiodarone 200 MG tablet ?Commonly known as: PACERONE ?Take 200 mg by mouth daily. ?  ?amitriptyline 50 MG tablet ?Commonly known as: ELAVIL ?Take 50 mg by mouth at bedtime. ?  ?amLODipine 5 MG tablet ?Commonly known as: NORVASC ?Take 5 mg by mouth daily. ?  ?atorvastatin 40 MG tablet ?Commonly known as: LIPITOR ?Take 40 mg by mouth daily. ?  ?bisacodyl 5 MG EC tablet ?Commonly known as: bisacodyl ?Take 1 tablet (5 mg total) by mouth daily as needed for moderate constipation. ?  ?cephALEXin 500 MG capsule ?Commonly known as: KEFLEX ?Take 1 capsule (500 mg total) by mouth 2 (two) times daily. ?  ?clopidogrel 75 MG tablet ?Commonly known as: PLAVIX ?Take 1 tablet (75 mg total) by mouth daily. ?  ?Glucagon Emergency 1 MG Kit ?  ?hydrocortisone 1 % ointment ?Apply 1 application topically 2 (two) times daily. Please apply liberally to your vulva twice a day to help prevent further irritation ?  ?insulin lispro protamine-lispro (75-25) 100 UNIT/ML Susp injection ?Commonly known as: HUMALOG 75/25 MIX ?Inject 20 Units into the skin daily. ?  ?insulin lispro protamine-lispro (75-25) 100 UNIT/ML Susp injection ?Commonly known as: HUMALOG 75/25 MIX ?Inject 18 Units into the skin at bedtime. ?  ?memantine 10 MG tablet ?Commonly known as: NAMENDA ?Take 10 mg by mouth 2 (two) times daily. ?  ?metoprolol tartrate 25 MG tablet ?Commonly known as: LOPRESSOR ?Take 25 mg by mouth 2 (two) times daily. ?  ?nitroGLYCERIN 0.4 MG SL tablet ?Commonly known as: NITROSTAT ?Place 1 tablet (0.4 mg total) under the tongue every 5 (five) minutes as needed for chest pain. ?  ?NovoLOG Mix 70/30 FlexPen (70-30) 100 UNIT/ML FlexPen ?Generic drug: insulin aspart protamine - aspart ?Inject 0.12 mLs (12 Units total) into the skin 2 (two) times daily with a meal. ?  ?Olopatadine HCl 0.2 % Soln ?  ?pantoprazole 40 MG tablet ?Commonly known as: PROTONIX ?Take 40  mg by mouth daily. ?  ?Premarin vaginal cream ?Generic drug: conjugated estrogens ?Place 1 Applicatorful vaginally daily. Apply 0.46m (pea-sized amount)  just inside the vaginal introitus with a finger-tip on  Monday, Wednesday and Friday nights. ?  ?torsemide 20 MG tablet ?Commonly known as: DEMADEX ?Take 20 mg by mouth daily. ?  ?traMADol 50 MG tablet ?Commonly known as: ULTRAM ?Take 50 mg by mouth 2 (two) times daily as needed. ?  ?vitamin B-12 1000 MCG tablet ?Commonly known as: CYANOCOBALAMIN ?Take 1,000 mcg by mouth daily. ?  ?Zinc Oxide 16 % Oint ?Apply liberally to the perineal area twice daily ?Started by: SZara Council PA-C ?  ? ?  ? ? ?Allergies:  ?Allergies  ?Allergen Reactions  ?  Ciprofloxacin Anaphylaxis  ? Sulfamethoxazole-Trimethoprim Other (See Comments) and Rash  ?  Went into [employer].  ? Augmentin [Amoxicillin-Pot Clavulanate] Hives  ? Ampicillin   ? Hydrochlorothiazide   ? Levofloxacin Other (See Comments)  ? Losartan Potassium-Hctz Other (See Comments)  ?  Other reaction(s): Unknown  ? Nitrofurantoin Other (See Comments)  ? Sulfa Antibiotics Other (See Comments)  ?  Kathreen Cosier ?[facility] [facility]  ? Sulfasalazine   ?  unknown  ? Levaquin [Levofloxacin In D5w]   ? ? ?Family History: ?Family History  ?Problem Relation Age of Onset  ? Diabetes Mellitus II Mother   ? Heart disease Mother   ? Lung cancer Father   ? Diabetes Paternal Grandmother   ? Diabetes Maternal Grandmother   ? Kidney disease Neg Hx   ? Prostate cancer Neg Hx   ? Bladder Cancer Neg Hx   ? ? ?Social History:  reports that she has never smoked. She has never used smokeless tobacco. She reports that she does not drink alcohol and does not use drugs. ? ? ?Physical Exam: ?BP 110/68   Pulse 83   ?Constitutional:  Well nourished. Alert and oriented, No acute distress. ?HEENT: [place] AT, moist mucus membranes.  Trachea midline, no masses. ?Cardiovascular: No clubbing, cyanosis, or edema. ?Respiratory: Normal respiratory  effort, no increased work of breathing. ?GI: Abdomen is soft, non tender, non distended, no abdominal masses. Liver and spleen not palpable.  No hernias appreciated.  Stool sample for occult testing is

## 2022-03-24 ENCOUNTER — Encounter: Payer: Self-pay | Admitting: Urology

## 2022-03-24 ENCOUNTER — Ambulatory Visit (INDEPENDENT_AMBULATORY_CARE_PROVIDER_SITE_OTHER): Payer: Medicare Other | Admitting: Urology

## 2022-03-24 VITALS — BP 110/68 | HR 83

## 2022-03-24 DIAGNOSIS — N952 Postmenopausal atrophic vaginitis: Secondary | ICD-10-CM | POA: Diagnosis not present

## 2022-03-24 DIAGNOSIS — R8271 Bacteriuria: Secondary | ICD-10-CM | POA: Diagnosis not present

## 2022-03-24 LAB — URINALYSIS, COMPLETE
Bilirubin, UA: NEGATIVE
Glucose, UA: NEGATIVE
Ketones, UA: NEGATIVE
Nitrite, UA: NEGATIVE
Specific Gravity, UA: 1.01 (ref 1.005–1.030)
Urobilinogen, Ur: 0.2 mg/dL (ref 0.2–1.0)
pH, UA: >9 — ABNORMAL HIGH (ref 5.0–7.5)

## 2022-03-24 LAB — MICROSCOPIC EXAMINATION

## 2022-03-24 MED ORDER — ZINC OXIDE 16 % EX OINT: TOPICAL_OINTMENT | CUTANEOUS | 2 refills | Status: DC

## 2022-03-28 LAB — CULTURE, URINE COMPREHENSIVE

## 2022-04-23 ENCOUNTER — Encounter: Payer: Self-pay | Admitting: Urology

## 2022-04-23 ENCOUNTER — Ambulatory Visit: Payer: Medicare Other | Admitting: Urology

## 2022-04-23 NOTE — Progress Notes (Incomplete)
04/23/22 12:43 PM   Pamela [name] [birth year] [id number]  Referring provider:  Baxter Hire, MD [place],  [place] [address] No chief complaint on file.   Urological history  Repeated Foley dislodgement  - Resides at an assisted living facility, her foley catheter kept coming out  - Previously tried on SPT but also continued to be dislodged.    2. Urinary retention -see #1 -PVR   3. Vaginal atrophy  - Managed on vaginal estrogen cream x3 days weekly - Applying zinc oxide to the perineal area x2 daily   4. Asymptomatic bacteriuria   HPI: Pamela [name] is a 88 y.o.female who presents today for a 1 month follow-up.        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 UIEP normal    Clinical depression 08/28/2014   CN (constipation) 03/17/2012   Dermatitis due to drug reaction 05/15/2012   Diabetes mellitus    Diastolic dysfunction, left ventricle 05/14/2012   DM (diabetes mellitus) ([place]) 05/12/2012   Dysrhythmia    Dysuria    Edema leg 07/02/2014   Enthesopathy of knee 08/07/2014   Excess fluid volume 05/20/2012   FOM (frequency of micturition) 07/29/2012   Pilar Plate hematuria 09/27/2014   Gangrene ([place])    GERD  (gastroesophageal reflux disease)    Heart disease    High cholesterol    HTN (hypertension) 05/12/2012   Hypercholesterolemia 05/12/2012   Hypertension    Incomplete bladder emptying 07/29/2012   Left bundle branch block (LBBB)    Open wound of knee, leg (except thigh), and ankle 06/01/2012   Stevens-Johnson syndrome ([place]) 05/15/2012   Overview:  Overview:  Several years ago, allergic reaction to sulfa antibiotic Several years ago, allergic reaction to sulfa antibiotic     Surgical History: Past Surgical History:  Procedure Laterality Date   ABDOMINAL HYSTERECTOMY     BLADDER SURGERY     cataract surgery     ENDOVENOUS ABLATION SAPHENOUS VEIN W/ LASER     LEFT HEART CATH AND CORONARY ANGIOGRAPHY N/A 11/29/2018   Procedure: LEFT HEART CATH AND CORONARY ANGIOGRAPHY;  Surgeon: Corey Skains, MD;  Location: [facility];  Service: Cardiovascular;  Laterality: N/A;   NASAL SINUS SURGERY     TOE AMPUTATION     TONSILLECTOMY      Home Medications:  Allergies as of 04/23/2022       Reactions   Ciprofloxacin Anaphylaxis   Sulfamethoxazole-trimethoprim Other (See Comments), Rash   Went into [employer] [facility].   Augmentin [amoxicillin-pot Clavulanate] Hives   Ampicillin    Hydrochlorothiazide    Levofloxacin Other (See Comments)   Losartan Potassium-hctz Other (See Comments)  Other reaction(s): Unknown   Nitrofurantoin Other (See Comments)   Sulfa Antibiotics Other (See Comments)   Kathreen Cosier [employer]   Sulfasalazine    unknown   Levaquin [levofloxacin In D5w]         Medication List        Accurate as of Apr 23, 2022 12:43 PM. If you have any questions, ask your nurse or doctor.          amiodarone 200 MG tablet Commonly known as: PACERONE Take 200 mg by mouth daily.   amitriptyline 50 MG tablet Commonly known as: ELAVIL Take 50 mg by mouth at bedtime.   amLODipine 5 MG tablet Commonly known as: NORVASC Take 5 mg by mouth daily.    atorvastatin 40 MG tablet Commonly known as: LIPITOR Take 40 mg by mouth daily.   bisacodyl 5 MG EC tablet Commonly known as: bisacodyl Take 1 tablet (5 mg total) by mouth daily as needed for moderate constipation.   cephALEXin 500 MG capsule Commonly known as: KEFLEX Take 1 capsule (500 mg total) by mouth 2 (two) times daily.   clopidogrel 75 MG tablet Commonly known as: PLAVIX Take 1 tablet (75 mg total) by mouth daily.   Glucagon Emergency 1 MG Kit   hydrocortisone 1 % ointment Apply 1 application topically 2 (two) times daily. Please apply liberally to your vulva twice a day to help prevent further irritation   insulin lispro protamine-lispro (75-25) 100 UNIT/ML Susp injection Commonly known as: HUMALOG 75/25 MIX Inject 20 Units into the skin daily.   insulin lispro protamine-lispro (75-25) 100 UNIT/ML Susp injection Commonly known as: HUMALOG 75/25 MIX Inject 18 Units into the skin at bedtime.   memantine 10 MG tablet Commonly known as: NAMENDA Take 10 mg by mouth 2 (two) times daily.   metoprolol tartrate 25 MG tablet Commonly known as: LOPRESSOR Take 25 mg by mouth 2 (two) times daily.   nitroGLYCERIN 0.4 MG SL tablet Commonly known as: NITROSTAT Place 1 tablet (0.4 mg total) under the tongue every 5 (five) minutes as needed for chest pain.   NovoLOG Mix 70/30 FlexPen (70-30) 100 UNIT/ML FlexPen Generic drug: insulin aspart protamine - aspart Inject 0.12 mLs (12 Units total) into the skin 2 (two) times daily with a meal.   Olopatadine HCl 0.2 % Soln   pantoprazole 40 MG tablet Commonly known as: PROTONIX Take 40 mg by mouth daily.   Premarin vaginal cream Generic drug: conjugated estrogens Place 1 Applicatorful vaginally daily. Apply 0.30m (pea-sized amount)  just inside the vaginal introitus with a finger-tip on  Monday, Wednesday and Friday nights.   torsemide 20 MG tablet Commonly known as: DEMADEX Take 20 mg by mouth daily.   traMADol 50 MG  tablet Commonly known as: ULTRAM Take 50 mg by mouth 2 (two) times daily as needed.   vitamin B-12 1000 MCG tablet Commonly known as: CYANOCOBALAMIN Take 1,000 mcg by mouth daily.   Zinc Oxide 16 % Oint Apply liberally to the perineal area twice daily        Allergies:  Allergies  Allergen Reactions   Ciprofloxacin Anaphylaxis   Sulfamethoxazole-Trimethoprim Other (See Comments) and Rash    Went into [employer].   Augmentin [Amoxicillin-Pot Clavulanate] Hives   Ampicillin    Hydrochlorothiazide    Levofloxacin Other (See Comments)   Losartan Potassium-Hctz Other (See Comments)    Other reaction(s): Unknown   Nitrofurantoin Other (See Comments)   Sulfa Antibiotics Other (See Comments)  Kathreen Cosier [employer]   Sulfasalazine     unknown   Levaquin [Levofloxacin In D5w]     Family History: Family History  Problem Relation Age of Onset   Diabetes Mellitus II Mother    Heart disease Mother    Lung cancer Father    Diabetes Paternal Grandmother    Diabetes Maternal Grandmother    Kidney disease Neg Hx    Prostate cancer Neg Hx    Bladder Cancer Neg Hx     Social History:  reports that she has never smoked. She has never used smokeless tobacco. She reports that she does not drink alcohol and does not use drugs.   Physical Exam: There were no vitals taken for this visit.  Constitutional:  Alert and oriented, No acute distress. HEENT: [place] AT, moist mucus membranes.  Trachea midline, no masses. Cardiovascular: No clubbing, cyanosis, or edema. Respiratory: Normal respiratory effort, no increased work of breathing. GI: Abdomen is soft, nontender, nondistended, no abdominal masses GU: No CVA tenderness Lymph: No cervical or inguinal lymphadenopathy. Skin: No rashes, bruises or suspicious lesions. Neurologic: Grossly intact, no focal deficits, moving all 4 extremities. Psychiatric: Normal mood and affect.  Laboratory Data:  Lab Results   Component Value Date   CREATININE 1.45 (H) 03/11/2022   Lab Results  Component Value Date   HGBA1C 7.6 (H) 05/13/2012   Component     Latest Ref Rng 03/11/2022  Sodium     135 - 145 mmol/L 141   Potassium     3.5 - 5.1 mmol/L 3.9   Chloride     98 - 111 mmol/L 105   CO2     22 - 32 mmol/L 25   Glucose     70 - 99 mg/dL 216 (H)   BUN     8 - 23 mg/dL 37 (H)   Creatinine     0.44 - 1.00 mg/dL 1.45 (H)   Calcium     8.9 - 10.3 mg/dL 9.4   Total Protein     6.5 - 8.1 g/dL 6.9   Albumin     3.5 - 5.0 g/dL 3.1 (L)   AST     15 - 41 U/L 49 (H)   ALT     0 - 44 U/L 45 (H)   Alkaline Phosphatase     38 - 126 U/L 126   Total Bilirubin     0.3 - 1.2 mg/dL 0.9   Anion gap     5 - 15  11   GFR, Estimated     >60 mL/min 35 (L)     (H) High (L) Low  Component     Latest Ref Rng 03/11/2022 03/24/2022  WBC     4.0 - 10.5 K/uL 6.9    RBC     0 - 2 /hpf 4.28  0-2   Hemoglobin     12.0 - 15.0 g/dL 13.7    HCT     36.0 - 46.0 % 43.1    MCV     80.0 - 100.0 fL 100.7 (H)    MCH     26.0 - 34.0 pg 32.0    MCHC     30.0 - 36.0 g/dL 31.8    RDW     11.5 - 15.5 % 13.2    Platelets     150 - 400 K/uL 181    Neutrophils     % 75    NEUT#  1.7 - 7.7 K/uL 5.2    Lymphocytes     % 17    Lymphocyte #     0.7 - 4.0 K/uL 1.2    Monocytes Relative     % 6    Monocyte #     0.1 - 1.0 K/uL 0.4    Eosinophil     % 1    Eosinophils Absolute     0.0 - 0.5 K/uL 0.1    Basophil     % 1    Basophils Absolute     0.0 - 0.1 K/uL 0.0    nRBC     0.0 - 0.2 % 0.0    Immature Granulocytes     % 0    Abs Immature Granulocytes     0.00 - 0.07 K/uL 0.02    WBC, UA     0 - 5 /hpf  6-10 !   Epithelial Cells (non renal)     0 - 10 /hpf  0-10   Bacteria, UA     None seen/Few   Many !     (H) High ! Abnormal Urinalysis   Pertinent Imaging:   Assessment & Plan:     No follow-ups on file.  [facility] [address] [place], [place] [address] [phone]  I,Kailey Littlejohn,acting as a scribe for [facility], PA-C.,have documented all relevant documentation on the behalf of SHANNON MCGOWAN, PA-C,as directed by  [facility], PA-C while in the presence of [place], PA-C.

## 2023-08-19 ENCOUNTER — Encounter: Payer: Self-pay | Admitting: Family Medicine

## 2023-08-19 ENCOUNTER — Other Ambulatory Visit: Payer: Self-pay

## 2023-08-19 ENCOUNTER — Inpatient Hospital Stay
Admission: EM | Admit: 2023-08-19 | Discharge: 2023-08-25 | DRG: 689 | Disposition: A | Discharge status: Skilled Nursing Facility | Payer: Medicare Other | Attending: Internal Medicine | Admitting: Internal Medicine

## 2023-08-19 DIAGNOSIS — Z90710 Acquired absence of both cervix and uterus: Secondary | ICD-10-CM | POA: Diagnosis not present

## 2023-08-19 DIAGNOSIS — B961 Klebsiella pneumoniae [K. pneumoniae] as the cause of diseases classified elsewhere: Secondary | ICD-10-CM | POA: Diagnosis present

## 2023-08-19 DIAGNOSIS — N1831 Chronic kidney disease, stage 3a: Secondary | ICD-10-CM

## 2023-08-19 DIAGNOSIS — F0153 Vascular dementia, unspecified severity, with mood disturbance: Secondary | ICD-10-CM | POA: Diagnosis present

## 2023-08-19 DIAGNOSIS — N1832 Chronic kidney disease, stage 3b: Secondary | ICD-10-CM | POA: Diagnosis present

## 2023-08-19 DIAGNOSIS — I2510 Atherosclerotic heart disease of native coronary artery without angina pectoris: Secondary | ICD-10-CM | POA: Diagnosis present

## 2023-08-19 DIAGNOSIS — N189 Chronic kidney disease, unspecified: Secondary | ICD-10-CM | POA: Diagnosis present

## 2023-08-19 DIAGNOSIS — Z794 Long term (current) use of insulin: Secondary | ICD-10-CM | POA: Diagnosis not present

## 2023-08-19 DIAGNOSIS — N3091 Cystitis, unspecified with hematuria: Secondary | ICD-10-CM | POA: Diagnosis present

## 2023-08-19 DIAGNOSIS — Z8249 Family history of ischemic heart disease and other diseases of the circulatory system: Secondary | ICD-10-CM

## 2023-08-19 DIAGNOSIS — F32A Depression, unspecified: Secondary | ICD-10-CM | POA: Diagnosis present

## 2023-08-19 DIAGNOSIS — N390 Urinary tract infection, site not specified: Secondary | ICD-10-CM

## 2023-08-19 DIAGNOSIS — Z881 Allergy status to other antibiotic agents status: Secondary | ICD-10-CM

## 2023-08-19 DIAGNOSIS — I130 Hypertensive heart and chronic kidney disease with heart failure and stage 1 through stage 4 chronic kidney disease, or unspecified chronic kidney disease: Secondary | ICD-10-CM | POA: Diagnosis present

## 2023-08-19 DIAGNOSIS — Z7901 Long term (current) use of anticoagulants: Secondary | ICD-10-CM

## 2023-08-19 DIAGNOSIS — Z880 Allergy status to penicillin: Secondary | ICD-10-CM

## 2023-08-19 DIAGNOSIS — N3090 Cystitis, unspecified without hematuria: Secondary | ICD-10-CM

## 2023-08-19 DIAGNOSIS — A4150 Gram-negative sepsis, unspecified: Secondary | ICD-10-CM

## 2023-08-19 DIAGNOSIS — R001 Bradycardia, unspecified: Secondary | ICD-10-CM | POA: Diagnosis not present

## 2023-08-19 DIAGNOSIS — I480 Paroxysmal atrial fibrillation: Secondary | ICD-10-CM | POA: Diagnosis present

## 2023-08-19 DIAGNOSIS — I10 Essential (primary) hypertension: Secondary | ICD-10-CM | POA: Diagnosis present

## 2023-08-19 DIAGNOSIS — I672 Cerebral atherosclerosis: Secondary | ICD-10-CM | POA: Diagnosis present

## 2023-08-19 DIAGNOSIS — E7800 Pure hypercholesterolemia, unspecified: Secondary | ICD-10-CM | POA: Diagnosis present

## 2023-08-19 DIAGNOSIS — I447 Left bundle-branch block, unspecified: Secondary | ICD-10-CM | POA: Diagnosis present

## 2023-08-19 DIAGNOSIS — K219 Gastro-esophageal reflux disease without esophagitis: Secondary | ICD-10-CM | POA: Diagnosis present

## 2023-08-19 DIAGNOSIS — Z833 Family history of diabetes mellitus: Secondary | ICD-10-CM | POA: Diagnosis not present

## 2023-08-19 DIAGNOSIS — I4891 Unspecified atrial fibrillation: Secondary | ICD-10-CM | POA: Diagnosis not present

## 2023-08-19 DIAGNOSIS — I5043 Acute on chronic combined systolic (congestive) and diastolic (congestive) heart failure: Secondary | ICD-10-CM | POA: Diagnosis present

## 2023-08-19 DIAGNOSIS — H9193 Unspecified hearing loss, bilateral: Secondary | ICD-10-CM | POA: Diagnosis present

## 2023-08-19 DIAGNOSIS — B9689 Other specified bacterial agents as the cause of diseases classified elsewhere: Secondary | ICD-10-CM | POA: Diagnosis present

## 2023-08-19 DIAGNOSIS — Z882 Allergy status to sulfonamides status: Secondary | ICD-10-CM

## 2023-08-19 DIAGNOSIS — Z7902 Long term (current) use of antithrombotics/antiplatelets: Secondary | ICD-10-CM

## 2023-08-19 DIAGNOSIS — Z79899 Other long term (current) drug therapy: Secondary | ICD-10-CM | POA: Diagnosis not present

## 2023-08-19 DIAGNOSIS — I482 Chronic atrial fibrillation, unspecified: Secondary | ICD-10-CM | POA: Diagnosis present

## 2023-08-19 DIAGNOSIS — Z888 Allergy status to other drugs, medicaments and biological substances status: Secondary | ICD-10-CM

## 2023-08-19 DIAGNOSIS — Z66 Do not resuscitate: Secondary | ICD-10-CM | POA: Diagnosis present

## 2023-08-19 DIAGNOSIS — Z79890 Hormone replacement therapy (postmenopausal): Secondary | ICD-10-CM

## 2023-08-19 DIAGNOSIS — E1122 Type 2 diabetes mellitus with diabetic chronic kidney disease: Secondary | ICD-10-CM | POA: Diagnosis present

## 2023-08-19 DIAGNOSIS — E785 Hyperlipidemia, unspecified: Secondary | ICD-10-CM | POA: Diagnosis not present

## 2023-08-19 LAB — URINALYSIS, W/ REFLEX TO CULTURE (INFECTION SUSPECTED)
Bilirubin Urine: NEGATIVE
Glucose, UA: NEGATIVE mg/dL
Hgb urine dipstick: NEGATIVE
Ketones, ur: NEGATIVE mg/dL
Nitrite: NEGATIVE
Protein, ur: 30 mg/dL — AB
Specific Gravity, Urine: 1.017 (ref 1.005–1.030)
pH: 5 (ref 5.0–8.0)

## 2023-08-19 LAB — BASIC METABOLIC PANEL WITH GFR
Anion gap: 9 (ref 5–15)
BUN: 29 mg/dL — ABNORMAL HIGH (ref 8–23)
CO2: 18 mmol/L — ABNORMAL LOW (ref 22–32)
Calcium: 9 mg/dL (ref 8.9–10.3)
Chloride: 113 mmol/L — ABNORMAL HIGH (ref 98–111)
Creatinine, Ser: 1.08 mg/dL — ABNORMAL HIGH (ref 0.44–1.00)
GFR, Estimated: 50 mL/min — ABNORMAL LOW
Glucose, Bld: 206 mg/dL — ABNORMAL HIGH (ref 70–99)
Potassium: 4.9 mmol/L (ref 3.5–5.1)
Sodium: 140 mmol/L (ref 135–145)

## 2023-08-19 LAB — CBC WITH DIFFERENTIAL/PLATELET
Abs Immature Granulocytes: 0.03 10*3/uL (ref 0.00–0.07)
Basophils Absolute: 0 10*3/uL (ref 0.0–0.1)
Basophils Relative: 0 %
Eosinophils Absolute: 0 10*3/uL (ref 0.0–0.5)
Eosinophils Relative: 0 %
HCT: 44.1 % (ref 36.0–46.0)
Hemoglobin: 14.4 g/dL (ref 12.0–15.0)
Immature Granulocytes: 1 %
Lymphocytes Relative: 12 %
Lymphs Abs: 0.7 10*3/uL (ref 0.7–4.0)
MCH: 31.8 pg (ref 26.0–34.0)
MCHC: 32.7 g/dL (ref 30.0–36.0)
MCV: 97.4 fL (ref 80.0–100.0)
Monocytes Absolute: 0.2 10*3/uL (ref 0.1–1.0)
Monocytes Relative: 3 %
Neutro Abs: 4.9 10*3/uL (ref 1.7–7.7)
Neutrophils Relative %: 84 %
Platelets: 151 10*3/uL (ref 150–400)
RBC: 4.53 MIL/uL (ref 3.87–5.11)
RDW: 12.9 % (ref 11.5–15.5)
WBC: 5.8 10*3/uL (ref 4.0–10.5)
nRBC: 0 % (ref 0.0–0.2)

## 2023-08-19 LAB — MAGNESIUM: Magnesium: 2.2 mg/dL (ref 1.7–2.4)

## 2023-08-19 LAB — LACTIC ACID, PLASMA
Lactic Acid, Venous: 2.3 mmol/L (ref 0.5–1.9)
Lactic Acid, Venous: 1.5 mmol/L (ref 0.5–1.9)

## 2023-08-19 LAB — CBG MONITORING, ED: Glucose-Capillary: 276 mg/dL — ABNORMAL HIGH (ref 70–99)

## 2023-08-19 MED ORDER — INSULIN ASPART 100 UNIT/ML IJ SOLN
0.0000 [IU] | Freq: Every day | INTRAMUSCULAR | Status: DC
  Administered 2023-08-20: 4 [IU] via SUBCUTANEOUS
  Filled 2023-08-19: qty 1

## 2023-08-19 MED ORDER — ONDANSETRON HCL 4 MG/2ML IJ SOLN
4.0000 mg | Freq: Four times a day (QID) | INTRAMUSCULAR | Status: DC | PRN
  Administered 2023-08-20: 4 mg via INTRAVENOUS
  Filled 2023-08-19: qty 2

## 2023-08-19 MED ORDER — METOPROLOL TARTRATE 5 MG/5ML IV SOLN
5.0000 mg | INTRAVENOUS | Status: AC | PRN
  Administered 2023-08-19 – 2023-08-20 (×3): 5 mg via INTRAVENOUS
  Filled 2023-08-19 (×4): qty 5

## 2023-08-19 MED ORDER — AMIODARONE LOAD VIA INFUSION
150.0000 mg | Freq: Once | INTRAVENOUS | Status: AC
  Administered 2023-08-19: 150 mg via INTRAVENOUS
  Filled 2023-08-19: qty 83.34

## 2023-08-19 MED ORDER — ATORVASTATIN CALCIUM 20 MG PO TABS: 40.0000 mg | ORAL_TABLET | Freq: Every day | ORAL | Status: DC

## 2023-08-19 MED ORDER — VITAMIN B-12 1000 MCG PO TABS
1000.0000 ug | ORAL_TABLET | Freq: Every day | ORAL | Status: DC
  Administered 2023-08-20 – 2023-08-25 (×6): 1000 ug via ORAL
  Filled 2023-08-19: qty 1
  Filled 2023-08-19: qty 2
  Filled 2023-08-19 (×4): qty 1

## 2023-08-19 MED ORDER — SODIUM CHLORIDE 0.9 % IV BOLUS
1000.0000 mL | Freq: Once | INTRAVENOUS | Status: AC
  Administered 2023-08-19: 1000 mL via INTRAVENOUS

## 2023-08-19 MED ORDER — NITROFURANTOIN MACROCRYSTAL 100 MG PO CAPS: 100.0000 mg | ORAL_CAPSULE | Freq: Two times a day (BID) | ORAL | 0 refills | Status: AC

## 2023-08-19 MED ORDER — AMIODARONE HCL IN DEXTROSE 360-4.14 MG/200ML-% IV SOLN
30.0000 mg/h | INTRAVENOUS | Status: DC
  Administered 2023-08-20: 30 mg/h via INTRAVENOUS

## 2023-08-19 MED ORDER — MEMANTINE HCL 10 MG PO TABS
10.0000 mg | ORAL_TABLET | Freq: Every day | ORAL | Status: DC
  Administered 2023-08-20 – 2023-08-25 (×6): 10 mg via ORAL
  Filled 2023-08-19: qty 2
  Filled 2023-08-19 (×5): qty 1

## 2023-08-19 MED ORDER — ACETAMINOPHEN 650 MG RE SUPP: 650.0000 mg | Freq: Four times a day (QID) | RECTAL | Status: DC | PRN

## 2023-08-19 MED ORDER — TRAMADOL HCL 50 MG PO TABS
50.0000 mg | ORAL_TABLET | Freq: Two times a day (BID) | ORAL | Status: DC | PRN
  Administered 2023-08-23: 50 mg via ORAL
  Filled 2023-08-19: qty 1

## 2023-08-19 MED ORDER — METOPROLOL TARTRATE 25 MG PO TABS
25.0000 mg | ORAL_TABLET | Freq: Once | ORAL | Status: AC
  Administered 2023-08-19: 25 mg via ORAL
  Filled 2023-08-19: qty 1

## 2023-08-19 MED ORDER — AMLODIPINE BESYLATE 5 MG PO TABS: 5.0000 mg | ORAL_TABLET | Freq: Every day | ORAL | Status: DC

## 2023-08-19 MED ORDER — AMIODARONE HCL IN DEXTROSE 360-4.14 MG/200ML-% IV SOLN: 60.0000 mg/h | INTRAVENOUS | Status: DC

## 2023-08-19 MED ORDER — TRAZODONE HCL 50 MG PO TABS
25.0000 mg | ORAL_TABLET | Freq: Every evening | ORAL | Status: DC | PRN
  Administered 2023-08-20 – 2023-08-23 (×3): 25 mg via ORAL
  Filled 2023-08-19 (×3): qty 1

## 2023-08-19 MED ORDER — LACTATED RINGERS IV SOLN
150.0000 mL/h | INTRAVENOUS | Status: DC
  Administered 2023-08-20 (×2): 150 mL/h via INTRAVENOUS

## 2023-08-19 MED ORDER — METOPROLOL SUCCINATE ER 25 MG PO TB24
25.0000 mg | ORAL_TABLET | Freq: Every day | ORAL | Status: DC
  Administered 2023-08-20 – 2023-08-25 (×6): 25 mg via ORAL
  Filled 2023-08-19 (×6): qty 1

## 2023-08-19 MED ORDER — INSULIN ASPART 100 UNIT/ML IJ SOLN
0.0000 [IU] | Freq: Three times a day (TID) | INTRAMUSCULAR | Status: DC
  Administered 2023-08-20: 3 [IU] via SUBCUTANEOUS
  Administered 2023-08-20 (×2): 2 [IU] via SUBCUTANEOUS
  Administered 2023-08-21: 3 [IU] via SUBCUTANEOUS
  Administered 2023-08-21 – 2023-08-23 (×6): 2 [IU] via SUBCUTANEOUS
  Administered 2023-08-23 – 2023-08-25 (×6): 1 [IU] via SUBCUTANEOUS
  Administered 2023-08-25: 2 [IU] via SUBCUTANEOUS
  Filled 2023-08-19 (×16): qty 1

## 2023-08-19 MED ORDER — SODIUM CHLORIDE 0.9 % IV BOLUS
500.0000 mL | Freq: Once | INTRAVENOUS | Status: AC
  Administered 2023-08-19: 500 mL via INTRAVENOUS

## 2023-08-19 MED ORDER — SODIUM CHLORIDE 0.9 % IV SOLN
1.0000 g | Freq: Once | INTRAVENOUS | Status: AC
  Administered 2023-08-19: 1 g via INTRAVENOUS
  Filled 2023-08-19: qty 10

## 2023-08-19 MED ORDER — INSULIN ASPART PROT & ASPART (70-30 MIX) 100 UNIT/ML ~~LOC~~ SUSP
5.0000 [IU] | Freq: Two times a day (BID) | SUBCUTANEOUS | Status: DC
  Administered 2023-08-20 – 2023-08-25 (×11): 5 [IU] via SUBCUTANEOUS
  Filled 2023-08-19 (×3): qty 10

## 2023-08-19 MED ORDER — ONDANSETRON HCL 4 MG PO TABS: 4.0000 mg | ORAL_TABLET | Freq: Four times a day (QID) | ORAL | Status: DC | PRN

## 2023-08-19 MED ORDER — PANTOPRAZOLE SODIUM 40 MG PO TBEC: 40.0000 mg | DELAYED_RELEASE_TABLET | Freq: Every day | ORAL | Status: DC

## 2023-08-19 MED ORDER — CLOPIDOGREL BISULFATE 75 MG PO TABS
75.0000 mg | ORAL_TABLET | Freq: Every day | ORAL | Status: DC
  Administered 2023-08-20: 75 mg via ORAL
  Filled 2023-08-19: qty 1

## 2023-08-19 MED ORDER — BISACODYL 5 MG PO TBEC: 5.0000 mg | DELAYED_RELEASE_TABLET | Freq: Every day | ORAL | Status: DC | PRN

## 2023-08-19 MED ORDER — ACETAMINOPHEN 325 MG PO TABS
650.0000 mg | ORAL_TABLET | Freq: Four times a day (QID) | ORAL | Status: DC | PRN
  Administered 2023-08-20: 650 mg via ORAL
  Filled 2023-08-19: qty 2

## 2023-08-19 MED ORDER — SODIUM CHLORIDE 0.9 % IV SOLN: 2.0000 g | Freq: Once | INTRAVENOUS | Status: DC

## 2023-08-19 MED ORDER — MAGNESIUM HYDROXIDE 400 MG/5ML PO SUSP: 30.0000 mL | Freq: Every day | ORAL | Status: DC | PRN

## 2023-08-19 MED ORDER — NITROFURANTOIN MONOHYD MACRO 100 MG PO CAPS
100.0000 mg | ORAL_CAPSULE | Freq: Once | ORAL | Status: AC
  Administered 2023-08-19: 100 mg via ORAL
  Filled 2023-08-19: qty 1

## 2023-08-19 MED ORDER — TORSEMIDE 20 MG PO TABS: 20.0000 mg | ORAL_TABLET | Freq: Every day | ORAL | Status: DC

## 2023-08-19 MED ORDER — AMIODARONE HCL IN DEXTROSE 360-4.14 MG/200ML-% IV SOLN
60.0000 mg/h | INTRAVENOUS | Status: DC
  Administered 2023-08-19: 60 mg/h via INTRAVENOUS
  Filled 2023-08-19 (×2): qty 200

## 2023-08-19 MED ORDER — APIXABAN 2.5 MG PO TABS
2.5000 mg | ORAL_TABLET | Freq: Two times a day (BID) | ORAL | Status: DC
  Administered 2023-08-20 – 2023-08-25 (×12): 2.5 mg via ORAL
  Filled 2023-08-19 (×12): qty 1

## 2023-08-19 MED ORDER — ENOXAPARIN SODIUM 40 MG/0.4ML IJ SOSY: 40.0000 mg | PREFILLED_SYRINGE | INTRAMUSCULAR | Status: DC

## 2023-08-19 MED ORDER — SODIUM CHLORIDE 0.9 % IV BOLUS: 500.0000 mL | Freq: Once | INTRAVENOUS | Status: DC

## 2023-08-19 MED ORDER — NITROGLYCERIN 0.4 MG SL SUBL: 0.4000 mg | SUBLINGUAL_TABLET | SUBLINGUAL | Status: DC | PRN

## 2023-08-19 MED ORDER — SODIUM CHLORIDE 0.9 % IV SOLN
2.0000 g | INTRAVENOUS | Status: DC
  Administered 2023-08-20: 2 g via INTRAVENOUS
  Filled 2023-08-19: qty 12.5

## 2023-08-19 MED ORDER — AMIODARONE HCL IN DEXTROSE 360-4.14 MG/200ML-% IV SOLN: 30.0000 mg/h | INTRAVENOUS | Status: DC

## 2023-08-19 NOTE — ED Provider Notes (Addendum)
Prior to discharge heart rate noted to be elevated in the 120s persistently, normal heart rate.  Will give some IV metoprolol, oral dosing, observe for heart rate control and if remains okay after treatment will discharge according to previous plan otherwise admitted for A-fib RVR.   Fortunately despite 5 mg x 3 IV metoprolol continues to be in the 120s, normotensive, will admit for atrial fibrillation RVR and UTI.  In the setting of UTI, tachycardia, mild hypotension, with a respiratory rate above 20, meet sepsis criteria will get blood cultures, give a dose of IV ceftriaxone, complete a 30 cc/kg ideal body weight fluid bolus, discussed with hospitalist and will start amiodarone for her RVR.  PROCEDURES:  Critical Care performed: Yes, see critical care procedure note(s)  .Critical Care  Performed by: Pilar Jarvis, MD Authorized by: Pilar Jarvis, MD   Critical care provider statement:    Critical care time (minutes):  30   Critical care was time spent personally by me on the following activities:  Development of treatment plan with patient or surrogate, discussions with consultants, evaluation of patient's response to treatment, examination of patient, ordering and review of laboratory studies, ordering and review of radiographic studies, ordering and performing treatments and interventions, pulse oximetry, re-evaluation of patient's condition and review of old charts       Note:  This document was prepared using Dragon voice recognition software and may include unintentional dictation errors.    Pilar Jarvis, MD 08/19/23 Diana Eves    Pilar Jarvis, MD 08/19/23 [phone]

## 2023-08-19 NOTE — Assessment & Plan Note (Signed)
-  The patient will be placed on supplemental coverage with NovoLog. - We will continue basal coverage.

## 2023-08-19 NOTE — Consult Note (Signed)
Pharmacy Antibiotic Note  Pamela [name] is a 89 y.o. female admitted on 08/19/2023 with tachycardia. Pharmacy has been consulted for cefepime dosing, c/f UTI  Plan: Initiate cefepime 2 gram Q24H  Height: 5\' 7"  (170.2 cm) Weight: 68.9 kg (152 lb) IBW/kg (Calculated) : 61.6  Temp (24hrs), Avg:98.5 F (36.9 C), Min:98.4 F (36.9 C), Max:98.6 F (37 C)  Recent Labs  Lab 08/19/23 1259 08/19/23 1940  WBC 5.8  --   CREATININE 1.08*  --   LATICACIDVEN  --  2.3*    Estimated Creatinine Clearance: 35.7 mL/min (A) (by C-G formula based on SCr of 1.08 mg/dL (H)).    Allergies  Allergen Reactions   Ciprofloxacin Anaphylaxis   Sulfamethoxazole-Trimethoprim Other (See Comments) and Rash    Went into [employer].   Augmentin [Amoxicillin-Pot Clavulanate] Hives   Ampicillin    Hydrochlorothiazide    Levofloxacin Other (See Comments)   Losartan Potassium-Hctz Other (See Comments)    Other reaction(s): Unknown   Sulfa Antibiotics Other (See Comments)    Levonne Spiller [employer]   Sulfasalazine     unknown   Levaquin [Levofloxacin In D5w]     Antimicrobials this admission: 9/19 ceftriaxone x 1  9/19 cefepime >>   Dose adjustments this admission:   Microbiology results: 9/19 BCx: sent 9/19 UCx: sent    Thank you for allowing pharmacy to be a part of this patient's care.  Sharen Hones, PharmD, BCPS Clinical Pharmacist   08/19/2023 9:52 PM

## 2023-08-19 NOTE — ED Notes (Signed)
Dr. Modesto Charon notified of pt's HR being 110's to 140's consistently despite PO metoprolol. See new orders for IV metoprolol and to reassess prior to potential discharge

## 2023-08-19 NOTE — Assessment & Plan Note (Signed)
-   We will continue statin therapy. 

## 2023-08-19 NOTE — Assessment & Plan Note (Addendum)
-   The patient will be admitted to a progressive unit bed. - Sepsis manifested by tachycardia and tachypnea. - We will continue IV antibiotic therapy with Rocephin. - We will follow blood and urine cultures. - The patient will be hydrated with IV lactated Ringer.

## 2023-08-19 NOTE — ED Notes (Signed)
Bedside report received from Chrissie Noa, RN and assumed care of patient at this time. Patient is currently resting quietly with eyes closed, VSS, CCM in use. Call light within reach. No other needs identified at this time.

## 2023-08-19 NOTE — ED Provider Notes (Signed)
[facility] Provider Note    Event Date/Time   First MD Initiated Contact with Patient 08/19/23 1244     (approximate)   History   Chief Complaint: Tachycardia   HPI  Pamela [name] is a 89 y.o. female with a history of diabetes, hypertension, atrial fibrillation who was sent to the ED from SNF due to rapid heart rate.  Patient denies chest pain shortness of breath or any other acute complaints.  Feels that she is in her usual state of health.  EMS note patient was in A-fib with a heart rate of about 150 for which they gave 5 mg IV diltiazem prior to arrival in the emergency department.     Physical Exam   Triage Vital Signs: ED Triage Vitals  Encounter Vitals Group     BP 08/19/23 1226 (!) 126/93     Systolic BP Percentile --      Diastolic BP Percentile --      Pulse Rate 08/19/23 1226 (!) 106     Resp 08/19/23 1226 20     Temp 08/19/23 1226 98.4 F (36.9 C)     Temp src --      SpO2 08/19/23 1226 100 %     Weight --      Height --      Head Circumference --      [place] [facility] --      Pain Score 08/19/23 1247 0     Pain Loc --      Pain Education --      Exclude from Growth Chart --     Most recent vital signs: Vitals:   08/19/23 1430 08/19/23 1500  BP: 122/78 108/71  Pulse: (!) 129 (!) 122  Resp: (!) 23 18  Temp:    SpO2: 95% 97%    General: Awake, no distress.  CV:  Good peripheral perfusion.  Irregular rhythm, heart rate 100-110 Resp:  Normal effort.  Clear to auscultation bilaterally Abd:  No distention.  Soft, mild suprapubic tenderness Other:  Somewhat dry oral mucosa.   ED Results / Procedures / Treatments   Labs (all labs ordered are listed, but only abnormal results are displayed) Labs Reviewed  BASIC METABOLIC PANEL - Abnormal; Notable for the following components:      Result Value   Chloride 113 (*)    CO2 18 (*)    Glucose, Bld 206 (*)    BUN 29 (*)    Creatinine, Ser 1.08 (*)    GFR, Estimated 50 (*)     All other components within normal limits  URINALYSIS, W/ REFLEX TO CULTURE (INFECTION SUSPECTED) - Abnormal; Notable for the following components:   Color, Urine AMBER (*)    APPearance CLOUDY (*)    Protein, ur 30 (*)    Leukocytes,Ua LARGE (*)    Bacteria, UA MANY (*)    All other components within normal limits  URINE CULTURE  CBC WITH DIFFERENTIAL/PLATELET  MAGNESIUM     EKG Interpreted by me Atrial fibrillation, rate of 107.  Normal axis, left bundle branch block.  No acute ischemic changes.   RADIOLOGY    PROCEDURES:  Procedures   MEDICATIONS ORDERED IN ED: Medications  metoprolol tartrate (LOPRESSOR) tablet 25 mg (has no administration in time range)  nitrofurantoin (macrocrystal-monohydrate) (MACROBID) capsule 100 mg (has no administration in time range)  sodium chloride 0.9 % bolus 500 mL (0 mLs Intravenous Stopped 08/19/23 1452)     IMPRESSION / MDM /  ASSESSMENT AND PLAN / ED COURSE  I reviewed the triage vital signs and the nursing notes.  DDx: Electrolyte abnormality, AKI, UTI, dehydration  Patient's presentation is most consistent with acute presentation with potential threat to life or bodily function.  Patient presents with elevated heart rate in the setting of chronic atrial fibrillation.  On a low-dose of metoprolol, 25 mg XR once a day will increase to 50 mg.  Has reassuring serum labs and exam, urinalysis consistent with UTI.  Will start antibiotics as well.  Stable for discharge.       FINAL CLINICAL IMPRESSION(S) / ED DIAGNOSES   Final diagnoses:  Chronic atrial fibrillation (HCC)  Cystitis     Rx / DC Orders   ED Discharge Orders          Ordered    nitrofurantoin (MACRODANTIN) 100 MG capsule  2 times daily        08/19/23 1513             Note:  This document was prepared using Dragon voice recognition software and may include unintentional dictation errors.   Sharman Cheek, MD 08/19/23 [phone]

## 2023-08-19 NOTE — Assessment & Plan Note (Addendum)
-   We will continue the patient on IV amiodarone drip. - Will optimize electrolytes. - We will start her on p.o. Eliquis. - 2D echo and cardiology consult will be obtained. - I notified Dr. Melton Alar about the patient.

## 2023-08-19 NOTE — H&P (Addendum)
[place]   PATIENT NAME: Pamela [name]    MR#:  [id number]  DATE OF BIRTH:  1934-04-27  DATE OF ADMISSION:  08/19/2023  PRIMARY CARE PHYSICIAN: Gracelyn Nurse, MD   Patient is coming from: Home  REQUESTING/REFERRING PHYSICIAN: Pilar Jarvis, MD  CHIEF COMPLAINT:   Chief Complaint  Patient presents with   Tachycardia    HISTORY OF PRESENT ILLNESS:  Pamela [name] is a 89 y.o. female with medical history significant for paroxysmal atrial fibrillation, diastolic dysfunction, GERD, essential hypertension, type 2 diabetes mellitus, hypertension and GERD, who presented to the emergency room with acute onset of tachycardia and palpitations without chest pain or dyspnea.  The patient's admitted to urinary frequency and urgency with mild dysuria without hematuria or flank pain.  No fever or chills.  She was noted to be in A-fib with RVR of 150 by EMS and was given 5 mg of IV diltiazem before coming to the ER.  No nausea or vomiting or abdominal pain.  No cough or wheezing or hemoptysis.  No other bleeding diathesis.  ED Course: When she came to the ER, BP was 126/93 with heart rate 106 with otherwise normal vital signs.  Labs revealed.  Later on respiratory rate was 25.  Labs revealed CO2 of 18 and glucose of 206 with a BUN of 29 creatinine 1.08 and CBC was within normal.  Lactic acid was 2.3 and later 1.5.  UA was positive for UTI.  Blood cultures and urine culture with sent. EKG as reviewed by me : EKG showed atrial flutter/fibrillation with a rate of 107 with left bundle branch block. Imaging: None.  The patient was given 5 mg of IV Lopressor here and 25 mg p.o. as well as 100 mg of p.o. Macrobid initially, 1.5 L of IV normal saline followed by a gram of IV Rocephin.  Due to persistent rapid ventricular response with her atrial fibrillation she was placed on IV amiodarone with bolus and drip.  She will be admitted to a progressive unit bed for further evaluation and management.     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 UIEP normal    Clinical depression 08/28/2014   CN (constipation) 03/17/2012   Dermatitis due to drug reaction 05/15/2012   Diabetes mellitus    Diastolic dysfunction, left ventricle 05/14/2012   DM (diabetes mellitus) (HCC) 05/12/2012   Dysrhythmia    Dysuria    Edema leg 07/02/2014   Enthesopathy of knee 08/07/2014   Excess fluid volume 05/20/2012   FOM (frequency of micturition) 07/29/2012   Homero Fellers hematuria 09/27/2014   Gangrene (HCC)    GERD (gastroesophageal reflux disease)  Heart disease    High cholesterol    HTN (hypertension) 05/12/2012   Hypercholesterolemia 05/12/2012   Hypertension    Incomplete bladder emptying 07/29/2012   Left bundle branch block (LBBB)    Open wound of knee, leg (except thigh), and ankle 06/01/2012   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   Overview:  Overview:  Several years ago, allergic reaction to sulfa antibiotic Several years ago, allergic reaction to sulfa antibiotic     PAST SURGICAL HISTORY:   Past Surgical History:  Procedure Laterality Date   ABDOMINAL HYSTERECTOMY     BLADDER SURGERY     cataract surgery     ENDOVENOUS ABLATION SAPHENOUS VEIN W/  LASER     LEFT HEART CATH AND CORONARY ANGIOGRAPHY N/A 11/29/2018   Procedure: LEFT HEART CATH AND CORONARY ANGIOGRAPHY;  Surgeon: Lamar Blinks, MD;  Location: [facility] INVASIVE CV LAB;  Service: Cardiovascular;  Laterality: N/A;   NASAL SINUS SURGERY     TOE AMPUTATION     TONSILLECTOMY      SOCIAL HISTORY:   Social History   Tobacco Use   Smoking status: Never   Smokeless tobacco: Never  Substance Use Topics   Alcohol use: No    Alcohol/week: 0.0 standard drinks of alcohol    FAMILY HISTORY:   Family History  Problem Relation Age of Onset   Diabetes Mellitus II Mother    Heart disease Mother    Lung cancer Father    Diabetes Paternal Grandmother    Diabetes Maternal Grandmother    Kidney disease Neg Hx    Prostate cancer Neg Hx    Bladder Cancer Neg Hx     DRUG ALLERGIES:   Allergies  Allergen Reactions   Ciprofloxacin Anaphylaxis   Sulfamethoxazole-Trimethoprim Other (See Comments) and Rash    Went into [employer].   Augmentin [Amoxicillin-Pot Clavulanate] Hives   Ampicillin    Hydrochlorothiazide    Levofloxacin Other (See Comments)   Losartan Potassium-Hctz Other (See Comments)    Other reaction(s): Unknown   Sulfa Antibiotics Other (See Comments)    Levonne Spiller Steven's Johnsons   Sulfasalazine     unknown   Levaquin [Levofloxacin In D5w]     REVIEW OF SYSTEMS:   ROS As per history of present illness. All pertinent systems were reviewed above. Constitutional, HEENT, cardiovascular, respiratory, GI, GU, musculoskeletal, neuro, psychiatric, endocrine, integumentary and hematologic systems were reviewed and are otherwise negative/unremarkable except for positive findings mentioned above in the HPI.   MEDICATIONS AT HOME:   Prior to Admission medications   Medication Sig Start Date End Date Taking? Authorizing Provider  nitrofurantoin (MACRODANTIN) 100 MG capsule Take 1 capsule (100 mg total) by mouth 2 (two) times daily for 7 days.  08/19/23 08/26/23 Yes Sharman Cheek, MD  amiodarone (PACERONE) 200 MG tablet Take 200 mg by mouth daily.  12/16/15   [provider]  amitriptyline (ELAVIL) 50 MG tablet Take 50 mg by mouth at bedtime.  03/31/16   [provider]  amLODipine (NORVASC) 5 MG tablet Take 5 mg by mouth daily.    [provider]  atorvastatin (LIPITOR) 40 MG tablet Take 40 mg by mouth daily.    [provider]  bisacodyl (BISACODYL) 5 MG EC tablet Take 1 tablet (5 mg total) by mouth daily as needed for moderate constipation. 11/30/18   Shaune Pollack, MD  cephALEXin (KEFLEX) 500 MG capsule Take 1 capsule (500 mg total) by mouth 2 (two) times daily. 05/11/20   York Cerise,  Kandee Keen, MD  clopidogrel (PLAVIX) 75 MG tablet Take 1 tablet (75 mg total) by mouth daily. 11/30/18   Shaune Pollack, MD  conjugated estrogens (PREMARIN) vaginal cream Place 1 Applicatorful vaginally daily. Apply 0.5mg  (pea-sized amount)  just inside the vaginal introitus with a finger-tip on  Monday, Wednesday and Friday nights. 02/20/22   McGowan, Wellington Hampshire, PA-C  Glucagon, rDNA, (GLUCAGON EMERGENCY) 1 MG KIT  11/21/19   [provider]  hydrocortisone 1 % ointment Apply 1 application topically 2 (two) times daily. Please apply liberally to your vulva twice a day to help prevent further irritation 12/06/18   Merrily Brittle, MD  insulin lispro protamine-lispro (HUMALOG 75/25 MIX) (75-25) 100 UNIT/ML SUSP injection Inject 20 Units into the skin daily.    [provider]  insulin lispro protamine-lispro (HUMALOG 75/25 MIX) (75-25) 100 UNIT/ML SUSP injection Inject 18 Units into the skin at bedtime.    [provider]  memantine (NAMENDA) 10 MG tablet Take 10 mg by mouth 2 (two) times daily. 01/30/20   [provider]  metoprolol tartrate (LOPRESSOR) 25 MG tablet Take 25 mg by mouth 2 (two) times daily.    [provider]  nitroGLYCERIN (NITROSTAT) 0.4 MG SL tablet Place 1 tablet (0.4 mg total) under  the tongue every 5 (five) minutes as needed for chest pain. 11/30/18   Shaune Pollack, MD  NOVOLOG MIX 70/30 FLEXPEN (70-30) 100 UNIT/ML FlexPen Inject 0.12 mLs (12 Units total) into the skin 2 (two) times daily with a meal. 11/30/17   Adrian Saran, MD  Olopatadine HCl 0.2 % SOLN  02/06/20   [provider]  pantoprazole (PROTONIX) 40 MG tablet Take 40 mg by mouth daily.    [provider]  torsemide (DEMADEX) 20 MG tablet Take 20 mg by mouth daily. 08/17/19   [provider]  traMADol (ULTRAM) 50 MG tablet Take 50 mg by mouth 2 (two) times daily as needed. 11/07/19   [provider]  vitamin B-12 (CYANOCOBALAMIN) 1000 MCG tablet Take 1,000 mcg by mouth daily.    [provider]  Zinc Oxide 16 % OINT Apply liberally to the perineal area twice daily 03/24/22   McGowan, Carollee Herter A, PA-C      VITAL SIGNS:  Blood pressure 109/75, pulse 78, temperature 97.7 F (36.5 C), temperature source Axillary, resp. rate 15, height 5\' 7"  (1.702 m), weight 68.9 kg, SpO2 98%.  PHYSICAL EXAMINATION:  Physical Exam  GENERAL:  89 y.o.-year-old Caucasian female patient lying in the bed with no acute distress.  EYES: Pupils equal, round, reactive to light and accommodation. No scleral icterus. Extraocular muscles intact.  HEENT: Head atraumatic, normocephalic. Oropharynx and nasopharynx clear.  NECK:  Supple, no jugular venous distention. No thyroid enlargement, no tenderness.  LUNGS: Normal breath sounds bilaterally, no wheezing, rales,rhonchi or crepitation. No use of accessory muscles of respiration.  CARDIOVASCULAR: Irregularly irregular rhythm, S1, S2 normal. No murmurs, rubs, or gallops.  ABDOMEN: Soft, nondistended, nontender. Bowel sounds present. No organomegaly or mass.  EXTREMITIES: No pedal edema, cyanosis, or clubbing.  NEUROLOGIC: Cranial nerves II through XII are intact. Muscle strength 5/5 in all extremities. Sensation intact. Gait not checked.  PSYCHIATRIC: The  patient is alert and oriented x 3.  Normal affect and good eye contact. SKIN: No obvious rash, lesion, or ulcer.   LABORATORY PANEL:   CBC Recent Labs  Lab 08/19/23 1259  WBC 5.8  HGB 14.4  HCT 44.1  PLT 151   ------------------------------------------------------------------------------------------------------------------  Chemistries  Recent  Labs  Lab 08/19/23 1259  NA 140  K 4.9  CL 113*  CO2 18*  GLUCOSE 206*  BUN 29*  CREATININE 1.08*  CALCIUM 9.0  MG 2.2   ------------------------------------------------------------------------------------------------------------------  Cardiac Enzymes No results for input(s): "TROPONINI" in the last 168 hours. ------------------------------------------------------------------------------------------------------------------  RADIOLOGY:  No results found.    IMPRESSION AND PLAN:  Assessment and Plan: * Sepsis due to gram-negative UTI [facility]) - The patient will be admitted to a progressive unit bed. - Sepsis manifested by tachycardia and tachypnea. - We will continue IV antibiotic therapy with Rocephin. - We will follow blood and urine cultures. - The patient will be hydrated with IV lactated Ringer.  Paroxysmal atrial fibrillation with RVR (HCC) - We will continue the patient on IV amiodarone drip. - Will optimize electrolytes. - 2D echo and cardiology consult to be obtained. - I notified Dr. Melton Alar about the patient.  Type 2 diabetes mellitus with chronic kidney disease (HCC) - The patient will be placed on supplemental coverage with NovoLog. - We will continue basal coverage.  Dyslipidemia - We will continue statin therapy.  Arteriosclerotic dementia with depression (HCC) - We will continue Namenda and Remeron and hold off amitriptyline.  Essential hypertension - We will continue antihypertensive therapy.   DVT prophylaxis: Eliquis. Advanced Care Planning:  Code Status: The patient is DNR and DNI. Family  Communication:  The plan of care was discussed in details with the patient (and family). I answered all questions. The patient agreed to proceed with the above mentioned plan. Further management will depend upon hospital course. Disposition Plan: Back to previous home environment Consults called: Cardiology. All the records are reviewed and case discussed with ED provider.  Status is: Inpatient  At the time of the admission, it appears that the appropriate admission status for this patient is inpatient.  This is judged to be reasonable and necessary in order to provide the required intensity of service to ensure the patient's safety given the presenting symptoms, physical exam findings and initial radiographic and laboratory data in the context of comorbid conditions.  The patient requires inpatient status due to high intensity of service, high risk of further deterioration and high frequency of surveillance required.  I certify that at the time of admission, it is my clinical judgment that the patient will require inpatient hospital care extending more than 2 midnights.                            Dispo: The patient is from: Home              Anticipated d/c is to: Home              Patient currently is not medically stable to d/c.              Difficult to place patient: No  Hannah Beat M.D on 08/19/2023 at 11:18 PM  [facility]   From 7 PM-7 AM, contact night-coverage [email or web address]  CC: Primary care physician; Gracelyn Nurse, MD

## 2023-08-19 NOTE — ED Triage Notes (Signed)
Pt arrives via [facility] from [facility]. Per EMS pt was having slight SOB last night and was placed on oxygen. Today when assessing pt they noticed her HR to be high and EMS was called. On arrival EMS noted HR to be 150-190. Given 5 mg diltiazem enroute.

## 2023-08-19 NOTE — Assessment & Plan Note (Signed)
-   We will continue Namenda and Remeron and hold off amitriptyline.

## 2023-08-19 NOTE — Assessment & Plan Note (Signed)
-   We will continue antihypertensive therapy.

## 2023-08-19 NOTE — Discharge Instructions (Addendum)
Your urine test show signs of a UTI. Take Macrobid to treat this bladder infection and follow up with your doctor in a week for further evaluation.  Continue all home medications.  Increase metoprolol XR from 25 mg once a day to 50 mg once a day and follow-up with your doctor.

## 2023-08-19 NOTE — ED Notes (Signed)
Mansy, MD notified of patient's lactic of 2.3. Repeat lactic already placed.

## 2023-08-20 ENCOUNTER — Inpatient Hospital Stay (HOSPITAL_COMMUNITY)
Admission: EM | Admit: 2023-08-20 | Discharge: 2023-08-20 | Disposition: A | Discharge status: Home / Self Care | Payer: Medicare Other | Source: Home / Self Care | Attending: Family Medicine | Admitting: Family Medicine

## 2023-08-20 DIAGNOSIS — Z66 Do not resuscitate: Secondary | ICD-10-CM | POA: Diagnosis present

## 2023-08-20 DIAGNOSIS — N390 Urinary tract infection, site not specified: Secondary | ICD-10-CM | POA: Diagnosis not present

## 2023-08-20 DIAGNOSIS — I4891 Unspecified atrial fibrillation: Secondary | ICD-10-CM

## 2023-08-20 DIAGNOSIS — B9689 Other specified bacterial agents as the cause of diseases classified elsewhere: Secondary | ICD-10-CM

## 2023-08-20 LAB — BASIC METABOLIC PANEL
Anion gap: 10 (ref 5–15)
BUN: 31 mg/dL — ABNORMAL HIGH (ref 8–23)
CO2: 17 mmol/L — ABNORMAL LOW (ref 22–32)
Calcium: 8.7 mg/dL — ABNORMAL LOW (ref 8.9–10.3)
Chloride: 112 mmol/L — ABNORMAL HIGH (ref 98–111)
Creatinine, Ser: 1.15 mg/dL — ABNORMAL HIGH (ref 0.44–1.00)
GFR, Estimated: 46 mL/min — ABNORMAL LOW (ref >60)
Glucose, Bld: 220 mg/dL — ABNORMAL HIGH (ref 70–99)
Potassium: 4.4 mmol/L (ref 3.5–5.1)
Sodium: 139 mmol/L (ref 135–145)

## 2023-08-20 LAB — ECHOCARDIOGRAM COMPLETE
AR max vel: 1.89 cm2
AV Area VTI: 2.15 cm2
AV Area mean vel: 1.9 cm2
AV Mean grad: 3 mmHg
AV Peak grad: 6 mmHg
Ao pk vel: 1.22 m/s
Area-P 1/2: 2.42 cm2
Height: 67 in
MV VTI: 1.42 cm2
S' Lateral: 3.2 cm
Weight: 2432 oz

## 2023-08-20 LAB — PROTIME-INR
INR: 1.2 (ref 0.8–1.2)
Prothrombin Time: 15.3 seconds — ABNORMAL HIGH (ref 11.4–15.2)

## 2023-08-20 LAB — CBG MONITORING, ED
Glucose-Capillary: 164 mg/dL — ABNORMAL HIGH (ref 70–99)
Glucose-Capillary: 202 mg/dL — ABNORMAL HIGH (ref 70–99)
Glucose-Capillary: 307 mg/dL — ABNORMAL HIGH (ref 70–99)

## 2023-08-20 LAB — CBC
HCT: 38.5 % (ref 36.0–46.0)
Hemoglobin: 12.7 g/dL (ref 12.0–15.0)
MCH: 31.8 pg (ref 26.0–34.0)
MCHC: 33 g/dL (ref 30.0–36.0)
MCV: 96.5 fL (ref 80.0–100.0)
Platelets: 169 10*3/uL (ref 150–400)
RBC: 3.99 MIL/uL (ref 3.87–5.11)
RDW: 13.2 % (ref 11.5–15.5)
WBC: 10.4 10*3/uL (ref 4.0–10.5)
nRBC: 0 % (ref 0.0–0.2)

## 2023-08-20 LAB — GLUCOSE, CAPILLARY
Glucose-Capillary: 155 mg/dL — ABNORMAL HIGH (ref 70–99)
Glucose-Capillary: 190 mg/dL — ABNORMAL HIGH (ref 70–99)

## 2023-08-20 LAB — HEMOGLOBIN A1C
Hgb A1c MFr Bld: 7.6 % — ABNORMAL HIGH (ref 4.8–5.6)
Mean Plasma Glucose: 171.42 mg/dL

## 2023-08-20 LAB — PROCALCITONIN: Procalcitonin: 0.71 ng/mL

## 2023-08-20 MED ORDER — HALOPERIDOL 1 MG PO TABS: 1.0000 mg | ORAL_TABLET | Freq: Four times a day (QID) | ORAL | Status: DC | PRN

## 2023-08-20 MED ORDER — FUROSEMIDE 10 MG/ML IJ SOLN
20.0000 mg | Freq: Once | INTRAMUSCULAR | Status: AC
  Administered 2023-08-20: 20 mg via INTRAVENOUS
  Filled 2023-08-20: qty 4

## 2023-08-20 MED ORDER — PROCHLORPERAZINE EDISYLATE 10 MG/2ML IJ SOLN
10.0000 mg | Freq: Once | INTRAMUSCULAR | Status: AC
  Administered 2023-08-20: 10 mg via INTRAVENOUS
  Filled 2023-08-20: qty 2

## 2023-08-20 MED ORDER — OLOPATADINE HCL 0.1 % OP SOLN
1.0000 [drp] | Freq: Two times a day (BID) | OPHTHALMIC | Status: DC
  Administered 2023-08-20 – 2023-08-25 (×10): 1 [drp] via OPHTHALMIC
  Filled 2023-08-20: qty 5

## 2023-08-20 MED ORDER — ALBUTEROL SULFATE (2.5 MG/3ML) 0.083% IN NEBU
2.5000 mg | INHALATION_SOLUTION | RESPIRATORY_TRACT | Status: DC | PRN
  Administered 2023-08-20: 2.5 mg via RESPIRATORY_TRACT
  Filled 2023-08-20: qty 3

## 2023-08-20 MED ORDER — MIRTAZAPINE 15 MG PO TABS
7.5000 mg | ORAL_TABLET | Freq: Every day | ORAL | Status: DC
  Administered 2023-08-20 – 2023-08-24 (×5): 7.5 mg via ORAL
  Filled 2023-08-20 (×5): qty 1

## 2023-08-20 MED ORDER — PERFLUTREN LIPID MICROSPHERE
1.0000 mL | INTRAVENOUS | Status: AC | PRN
  Administered 2023-08-20: 5 mL via INTRAVENOUS

## 2023-08-20 MED ORDER — SERTRALINE HCL 50 MG PO TABS
25.0000 mg | ORAL_TABLET | Freq: Every day | ORAL | Status: DC
  Administered 2023-08-21 – 2023-08-25 (×5): 25 mg via ORAL
  Filled 2023-08-20 (×6): qty 1

## 2023-08-20 MED ORDER — MIRABEGRON ER 50 MG PO TB24
50.0000 mg | ORAL_TABLET | Freq: Every day | ORAL | Status: DC
  Administered 2023-08-21 – 2023-08-25 (×5): 50 mg via ORAL
  Filled 2023-08-20 (×6): qty 1

## 2023-08-20 MED ORDER — ROSUVASTATIN CALCIUM 10 MG PO TABS
20.0000 mg | ORAL_TABLET | Freq: Every day | ORAL | Status: DC
  Administered 2023-08-20 – 2023-08-25 (×6): 20 mg via ORAL
  Filled 2023-08-20 (×5): qty 2
  Filled 2023-08-20: qty 1

## 2023-08-20 MED ORDER — AMIODARONE HCL 200 MG PO TABS
400.0000 mg | ORAL_TABLET | Freq: Two times a day (BID) | ORAL | Status: DC
  Administered 2023-08-20 – 2023-08-23 (×8): 400 mg via ORAL
  Filled 2023-08-20 (×8): qty 2

## 2023-08-20 MED ORDER — IPRATROPIUM-ALBUTEROL 0.5-2.5 (3) MG/3ML IN SOLN: 3.0000 mL | Freq: Two times a day (BID) | RESPIRATORY_TRACT | Status: DC | PRN

## 2023-08-20 MED ORDER — SENNOSIDES-DOCUSATE SODIUM 8.6-50 MG PO TABS
1.0000 | ORAL_TABLET | Freq: Two times a day (BID) | ORAL | Status: DC
  Administered 2023-08-20 – 2023-08-25 (×10): 1 via ORAL
  Filled 2023-08-20 (×11): qty 1

## 2023-08-20 MED ORDER — POLYETHYLENE GLYCOL 3350 17 G PO PACK
17.0000 g | PACK | Freq: Every day | ORAL | Status: DC
  Administered 2023-08-21 – 2023-08-25 (×5): 17 g via ORAL
  Filled 2023-08-20 (×6): qty 1

## 2023-08-20 NOTE — ED Notes (Signed)
Pt reports little improvement with the nebulizer. MD aware.

## 2023-08-20 NOTE — ED Notes (Signed)
Labs drawn. Patient back to resting quietly with eyes closed, VSS, CCM in use, call light within reach. No other needs identified at this time.

## 2023-08-20 NOTE — ED Notes (Signed)
Pt co "dry mouth", water provided. Brief inspected, pt repositioned. Pt states the "back of her head hurts", when assessed she claims it is bilateral. Call light within reach.

## 2023-08-20 NOTE — ED Notes (Signed)
Patient rhythm strip showed dropped beats, dropping her HR to 38 and then she went right back into A.fib in the 90s. BP stable. Mansy, MD notified. Stated to hold amiodarone gtt if this occurs again.

## 2023-08-20 NOTE — ED Notes (Signed)
Pharmacy called to see about delay in medications and not being verified, stated once they got around to verifying them they would retime them and not to cancel or not administer on the [facility].

## 2023-08-20 NOTE — ED Notes (Signed)
Patient medicated with what medications have been verified by pharmacy. Brief and pad changed underneath. VSS, CCM in use, call light within reach. No other needs identified at this time.

## 2023-08-20 NOTE — ED Notes (Signed)
Patient's HR has now dropped to 50s and sustained. BP still normal. Secure chat messaged Dr. Arville Care, awaiting response. Amio gtt paused while awaiting further orders.

## 2023-08-20 NOTE — Progress Notes (Signed)
Progress Note   Patient: Pamela [name] WUJ:[id number] DOB: 1934/06/09 DOA: 08/19/2023     1 DOS: the patient was seen and examined on 08/20/2023   Brief hospital course:  89yo with h/o PAF, chronic diastolic CHF, HTN, DM,and HLD who presented on 9/19 with tachycardia and palpitations, found to be in afib with RVR.  She was given Dilt x 1 and Lopressor as well as Macrobid and Ceftriaxone.  She was placed on Amiodarone drip.  This AM, she complained of wheezing and was given Albuterol; on exam she was on 150cc/hour with crackles and did not have improvement with neb, given 20 mg IV Lasix with cessation of IVF.  Assessment and Plan:  Klebsiella pneumoniae UTI Concern for sepsis on admission but this has been ruled out Patient does have a Klebsiella UTI and this may have triggered RVR, which accounts for the SIRS physiology Treated with Macrobid -> Ceftriaxone -> Cefepime Will follow blood and urine cultures  Paroxysmal atrial fibrillation with RVR (HCC) Patient started on IV amiodarone drip -> transition to PO per cardiology Continue metoprolol Started on Eliquis 2D echo ordered Cardiology consulted  Chronic systolic CHF Stopped IVF, given Lasix this AM with improvement in breathing   Type 2 diabetes mellitus with chronic kidney disease (HCC) hold Tradjenta Continue 70/30 BID Cover with moderate-scale SSI    Dyslipidemia Started on rosuvastatin   Arteriosclerotic dementia with depression (HCC) We will continue Namenda, sertraline, mirtazepine Continue prn Haldol   Essential hypertension Continue Toprol XL  CAD No c/o CP She has been on Plavix at home but was started on Eliquis overnight  Stage 3a/b CKD Appears to be stable at this time, teeters between a and b Attempt to avoid nephrotoxic medications Recheck BMP in AM    DNR Patient made DNR on admission She will need a gold out of facility DNR form at the time of discharge      Consultants: Cardiology    Procedures: None   Antibiotics: Macrobid x 1 dose Ceftriaxone x 1 dose Cefepime 9/20-   30 Day Unplanned Readmission Risk Score    Flowsheet Row ED to Hosp-Admission (Current) from 08/19/2023 in [facility] Emergency Department at [facility]  30 Day Unplanned Readmission Risk Score (%) 18.72 Filed at 08/20/2023 0801       This score is the patient's risk of an unplanned readmission within 30 days of being discharged (0 -100%). The score is based on dignosis, age, lab data, medications, orders, and past utilization.   Low:  0-14.9   Medium: 15-21.9   High: 22-29.9   Extreme: 30 and above          Subjective: Feeling somewhat SOB at the time of my evaluation.  Otherwise without specific concerns.  Physical Exam: Vitals:   08/20/23 1200 08/20/23 1230 08/20/23 1300 08/20/23 1412  BP: (!) 154/83 (!) 156/80 (!) 152/91 (!) 161/87  Pulse: 77 77 78 77  Resp: 16 (!) 22 (!) 22 20  Temp:    97.7 F (36.5 C)  TempSrc:    Oral  SpO2: 97% 95% 98% 100%  Weight:      Height:          Intake/Output Summary (Last 24 hours) at 08/20/2023 1431 Last data filed at 08/20/2023 0918 Gross per 24 hour  Intake 2313.19 ml  Output --  Net 2313.19 ml   Filed Weights   08/19/23 2140  Weight: 68.9 kg    Exam:  General:  Appears calm and comfortable and  is in NAD Eyes:   EOMI, normal lids, iris ENT:   hard of hearing, grossly normal lips & tongue, mmm Neck:  no LAD, masses or thyromegaly Cardiovascular:  RRR, no m/r/g. No LE edema.  Respiratory:   Mild tachypnea with bibasilar crackles Abdomen:  soft, NT, ND Skin:  no rash or induration seen on limited exam Musculoskeletal:  grossly normal tone BUE/BLE, good ROM, no bony abnormality Psychiatric:  pleasantly confused mood and affect, speech fluent and appropriate with some apparent confusion Neurologic:  CN 2-12 grossly intact, moves all extremities in coordinated fashion  Data Reviewed: I have reviewed the patient's lab  results since admission.  Pertinent labs for today include:   Glucose 112 CO2 17 Glucose 220 BUN 31/Creatinine 1.15/GFR 46 - stable Lactate 2.3, 1.5 Procalcitonin 0.71 Normal CBC INR 1.2 UA: large LE, 30 protein, many ketones Blood and urine cultures pending    Family Communication: None present  Disposition: Status is: Inpatient Remains inpatient appropriate because: ongoing evaluation and treatment  Planned Discharge Destination: Skilled nursing facility    Time spent: 50 minutes  Author: Jonah Blue, MD 08/20/2023 2:31 PM  For on call review [email or web address].

## 2023-08-20 NOTE — Consult Note (Signed)
**Pamela [name] De-Identified via Obfuscation** Pamela Pamela [name]       Patient ID: Pamela Pamela [name] MRN: [id number] DOB/AGE: [birth year] 89 y.o.  Admit date: 08/19/2023 Referring Physician Dr Ophelia Charter Primary Cardiologist Gwen Pounds (retired) Reason for Consultation Afib RVR  HPI: Pamela Pamela [name] is a 89 y.o. female  with a past medical history of paroxysmal Afib, hypertension, and hyperlipidemia who presented to the ED on 08/19/2023 for palpitations and tachycardia. She was given IV cardizem and lopressor then subsequently placed on amiodarone drip.  She has not been to our office in many years. Patient no longer having palpitations. She is currently rate controlled and feeling better. Breathing is improved and she is getting a breathing treatment now. Patient denies chest pain, shortness of breath, palpitations, diaphoresis, syncope, edema, PND, orthopnea at this time.   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 UIEP normal    Clinical depression 08/28/2014   CN (constipation) 03/17/2012   Dermatitis due to drug reaction 05/15/2012   Diabetes mellitus    Diastolic dysfunction, left ventricle 05/14/2012   DM (diabetes  mellitus) (HCC) 05/12/2012   Dysrhythmia    Dysuria    Edema leg 07/02/2014   Enthesopathy of knee 08/07/2014   Excess fluid volume 05/20/2012   FOM (frequency of micturition) 07/29/2012   Homero Fellers hematuria 09/27/2014   Gangrene (HCC)    GERD (gastroesophageal reflux disease)    Heart disease    High cholesterol    HTN (hypertension) 05/12/2012   Hypercholesterolemia 05/12/2012   Hypertension    Incomplete bladder emptying 07/29/2012   Left bundle branch block (LBBB)    Open wound of knee, leg (except thigh), and ankle 06/01/2012   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   Overview:  Overview:  Several years ago, allergic reaction to sulfa antibiotic Several years ago, allergic reaction to sulfa antibiotic     Past Surgical History:  Procedure Laterality Date   ABDOMINAL HYSTERECTOMY     BLADDER SURGERY     cataract surgery     ENDOVENOUS ABLATION SAPHENOUS VEIN W/ LASER     LEFT HEART CATH AND CORONARY ANGIOGRAPHY N/A 11/29/2018   Procedure: LEFT HEART CATH AND CORONARY ANGIOGRAPHY;  Surgeon: Lamar Blinks, MD;  Location: [facility] INVASIVE CV LAB;  Service: Cardiovascular;  Laterality: N/A;   NASAL SINUS SURGERY     TOE AMPUTATION     TONSILLECTOMY      (Not in a hospital admission)  Social History   Socioeconomic History   Marital status: Married    Spouse name: Not on file   Number of children: Not on file   Years of education:  Not on file   Highest education level: Not on file  Occupational History   Not on file  Tobacco Use   Smoking status: Never   Smokeless tobacco: Never  Vaping Use   Vaping status: Not on file  Substance and Sexual Activity   Alcohol use: No    Alcohol/week: 0.0 standard drinks of alcohol   Drug use: No   Sexual activity: Not Currently    Birth control/protection: Post-menopausal  Other Topics Concern   Not on file  Social History Narrative   Not on file   Social Determinants of Health   Financial Resource Strain: Not on file  Food  Insecurity: Not on file  Transportation Needs: Not on file  Physical Activity: Not on file  Stress: Not on file  Social Connections: Not on file  Intimate Partner Violence: Not on file    Family History  Problem Relation Age of Onset   Diabetes Mellitus II Mother    Heart disease Mother    Lung cancer Father    Diabetes Paternal Grandmother    Diabetes Maternal Grandmother    Kidney disease Neg Hx    Prostate cancer Neg Hx    Bladder Cancer Neg Hx      Vitals:   08/20/23 0600 08/20/23 0626 08/20/23 0900 08/20/23 0944  BP: 139/72  (!) 174/108 (!) 162/96  Pulse: (!) 55  76 76  Resp: 18  (!) 22   Temp:  97.8 F (36.6 C)    TempSrc:  Axillary    SpO2: 95%  96%   Weight:      Height:        PHYSICAL EXAM General: alert, well nourished, in no acute distress. HEENT: Normocephalic and atraumatic. Neck: No JVD.  Lungs: Normal respiratory effort. Coarse bilaterally to auscultation. No wheezes, crackles, rhonchi.  Heart: HRRR. Normal S1 and S2 without gallops or murmurs.  Abdomen: Non-distended appearing.  Msk: Normal strength and tone for age. Extremities: Warm and well perfused. No clubbing, cyanosis. no edema.  Neuro: Alert and oriented X 3. Psych: Answers questions appropriately.   Labs: Basic Metabolic Panel: Recent Labs    08/19/23 1259 08/20/23 0442  NA 140 139  K 4.9 4.4  CL 113* 112*  CO2 18* 17*  GLUCOSE 206* 220*  BUN 29* 31*  CREATININE 1.08* 1.15*  CALCIUM 9.0 8.7*  MG 2.2  --    Liver Function Tests: No results for input(s): "AST", "ALT", "ALKPHOS", "BILITOT", "PROT", "ALBUMIN" in the last 72 hours. No results for input(s): "LIPASE", "AMYLASE" in the last 72 hours. CBC: Recent Labs    08/19/23 1259 08/20/23 0442  WBC 5.8 10.4  NEUTROABS 4.9  --   HGB 14.4 12.7  HCT 44.1 38.5  MCV 97.4 96.5  PLT 151 169   Cardiac Enzymes: No results for input(s): "CKTOTAL", "CKMB", "CKMBINDEX", "TROPONINIHS" in the last 72 hours. BNP: No results for  input(s): "BNP" in the last 72 hours. D-Dimer: No results for input(s): "DDIMER" in the last 72 hours. Hemoglobin A1C: No results for input(s): "HGBA1C" in the last 72 hours. Fasting Lipid Panel: No results for input(s): "CHOL", "HDL", "LDLCALC", "TRIG", "CHOLHDL", "LDLDIRECT" in the last 72 hours. Thyroid Function Tests: No results for input(s): "TSH", "T4TOTAL", "T3FREE", "THYROIDAB" in the last 72 hours.  Invalid input(s): "FREET3" Anemia Panel: No results for input(s): "VITAMINB12", "FOLATE", "FERRITIN", "TIBC", "IRON", "RETICCTPCT" in the last 72 hours.   Radiology: No results found.  ECHO pending  10/2020 ECHO: - Left ventricle: The  cavity size was normal. Systolic function was    mildly reduced. The estimated ejection fraction was in the range    of 45% to 50%.  - Aortic valve: There was mild regurgitation.  - Mitral valve: Calcified annulus. Mildly thickened leaflets .    There was moderate regurgitation.  - Left atrium: The atrium was mildly dilated.    10/2018 LHC: Mid LM lesion is 40% stenosed. Prox LAD lesion is 55% stenosed. Ost LAD lesion is 40% stenosed. Left ventricular angiogram shows normal LV systolic function ejection fraction of 50%   TELEMETRY reviewed by me [facility]) 08/20/2023 : Afib CVR  EKG reviewed by me: Afib with LBBB, rate 107 bpm  Data reviewed by me [facility]) 08/20/2023: last 24h vitals tele labs imaging I/O provider notes  Principal Problem:   Sepsis due to gram-negative UTI [facility]) Active Problems:   Paroxysmal atrial fibrillation with RVR (HCC)   Type 2 diabetes mellitus with chronic kidney disease (HCC)   Essential hypertension   Arteriosclerotic dementia with depression (HCC)   Dyslipidemia    ASSESSMENT AND PLAN:   Afib RVR, currently CVR Continue metoprolol, Eliquis. Currently on amio gtt, will begin transitioning to PO today. Start amio 400 mg BID for 10 days, then 200 mg BID for 10 days, followed by 200 mg daily thereafter. Stop  amio gtt a few hours after first PO dose.   HFmrEF 45-50% currently compensated Repeat echocardiogram ordered   CAD with history of diagnostic LHC without intervention Continue metoprolol No need for ASA as she is on Eliquis I have added Crestor 20 mg at bedtime to her current regiment   Sepsis due to gram-negative UTI  Per primary team    Signed: Clotilde Dieter, DO 08/20/2023, 12:01 PM [facility]

## 2023-08-21 DIAGNOSIS — E785 Hyperlipidemia, unspecified: Secondary | ICD-10-CM | POA: Diagnosis not present

## 2023-08-21 DIAGNOSIS — I4891 Unspecified atrial fibrillation: Secondary | ICD-10-CM | POA: Diagnosis not present

## 2023-08-21 DIAGNOSIS — I480 Paroxysmal atrial fibrillation: Secondary | ICD-10-CM | POA: Diagnosis not present

## 2023-08-21 DIAGNOSIS — N390 Urinary tract infection, site not specified: Secondary | ICD-10-CM | POA: Diagnosis not present

## 2023-08-21 DIAGNOSIS — E1122 Type 2 diabetes mellitus with diabetic chronic kidney disease: Secondary | ICD-10-CM | POA: Diagnosis not present

## 2023-08-21 DIAGNOSIS — I5043 Acute on chronic combined systolic (congestive) and diastolic (congestive) heart failure: Secondary | ICD-10-CM

## 2023-08-21 DIAGNOSIS — E7800 Pure hypercholesterolemia, unspecified: Secondary | ICD-10-CM

## 2023-08-21 LAB — GLUCOSE, CAPILLARY
Glucose-Capillary: 192 mg/dL — ABNORMAL HIGH (ref 70–99)
Glucose-Capillary: 233 mg/dL — ABNORMAL HIGH (ref 70–99)
Glucose-Capillary: 196 mg/dL — ABNORMAL HIGH (ref 70–99)
Glucose-Capillary: 187 mg/dL — ABNORMAL HIGH (ref 70–99)

## 2023-08-21 MED ORDER — GLUCERNA SHAKE PO LIQD
237.0000 mL | Freq: Three times a day (TID) | ORAL | Status: DC
  Administered 2023-08-22 – 2023-08-25 (×7): 237 mL via ORAL

## 2023-08-21 MED ORDER — SODIUM CHLORIDE 0.9 % IV SOLN
1.0000 g | INTRAVENOUS | Status: DC
  Administered 2023-08-21 – 2023-08-22 (×2): 1 g via INTRAVENOUS
  Filled 2023-08-21 (×3): qty 10

## 2023-08-21 MED ORDER — ENSURE ENLIVE PO LIQD
237.0000 mL | Freq: Two times a day (BID) | ORAL | Status: DC
  Administered 2023-08-21: 237 mL via ORAL

## 2023-08-21 MED ORDER — ADULT MULTIVITAMIN W/MINERALS CH
1.0000 | ORAL_TABLET | Freq: Every day | ORAL | Status: DC
  Administered 2023-08-21 – 2023-08-25 (×5): 1 via ORAL
  Filled 2023-08-21 (×5): qty 1

## 2023-08-21 NOTE — Progress Notes (Signed)
Initial Nutrition Assessment  DOCUMENTATION CODES:   Not applicable  INTERVENTION:   -MVI with minerals daily -Glucerna Shake po TID, each supplement provides 220 kcal and 10 grams of protein   NUTRITION DIAGNOSIS:   Increased nutrient needs related to acute illness as evidenced by estimated needs.  GOAL:   Patient will meet greater than or equal to 90% of their needs  MONITOR:   PO intake, Supplement acceptance  REASON FOR ASSESSMENT:   Malnutrition Screening Tool    ASSESSMENT:   Pt with medical history significant for paroxysmal atrial fibrillation, diastolic dysfunction, GERD, essential hypertension, type 2 diabetes mellitus, hypertension and GERD, who presented with acute onset of tachycardia and palpitations without chest pain or dyspnea.  Admitted to urinary frequency and urgency with mild dysuria without hematuria or flank pain.  Pt admitted with sepsis secondary to gram negative UTI and a-fib with RVR.   Reviewed I/O's: +473 ml x 24 hours and +1.6 L since admission  UOP: 1.2 L x 24 hours   Pt unavailable at time of visit. Attempted to speak with pt via call to hospital room phone, however, unable to reach. RD unable to obtain further nutrition-related history or complete nutrition-focused physical exam at this time.    Pt currently on a carb modified diet. No meal completion data available to assess at this time.   Reviewed wt hx; noted distant history of weight loss. Weight loss is concerning due to advanced age and multiple co-morbidities. Pt would greatly benefit from addition of oral nutrition supplements.   Medications reviewed and include vitamin B-12, remeron, senokot, and miralax.   Lab Results  Component Value Date   HGBA1C 7.6 (H) 08/20/2023   PTA DM medications are 20 units insulin lispro-protamine lispro daily.   Labs reviewed: CBGS: 155-233 (inpatient orders for glycemic control are 0-9 units insulin aspart TID with meals and 5 units insulin  aspart-protamine aspart BID).    Diet Order:   Diet Order             Diet Carb Modified Fluid consistency: Thin; Room service appropriate? Yes  Diet effective now                   EDUCATION NEEDS:   No education needs have been identified at this time  Skin:  Skin Assessment: Reviewed RN Assessment  Last BM:  08/21/23 (type 3)  Height:   Ht Readings from Last 1 Encounters:  08/19/23 5\' 7"  (1.702 m)    Weight:   Wt Readings from Last 1 Encounters:  08/19/23 68.9 kg    Ideal Body Weight:  61.4 kg  BMI:  Body mass index is 23.81 kg/m.  Estimated Nutritional Needs:   Kcal:  1700-1900  Protein:  85-100 grams  Fluid:  > 1.7 L    Levada Schilling, RD, LDN, CDCES Registered Dietitian II Certified Diabetes Care and Education Specialist Please refer to [facility] for RD and/or RD on-call/weekend/after hours pager

## 2023-08-21 NOTE — Plan of Care (Signed)
  Problem: Fluid Volume: Goal: Hemodynamic stability will improve Outcome: Progressing   Problem: Coping: Goal: Ability to adjust to condition or change in health will improve Outcome: Progressing   Problem: Nutritional: Goal: Maintenance of adequate nutrition will improve Outcome: Progressing   Problem: Skin Integrity: Goal: Risk for impaired skin integrity will decrease Outcome: Progressing   Problem: Education: Goal: Knowledge of General Education information will improve Description: Including pain rating scale, medication(s)/side effects and non-pharmacologic comfort measures Outcome: Progressing   Problem: Activity: Goal: Risk for activity intolerance will decrease Outcome: Progressing   Problem: Clinical Measurements: Goal: Cardiovascular complication will be avoided Outcome: Progressing   Problem: Coping: Goal: Level of anxiety will decrease Outcome: Progressing   Problem: Nutrition: Goal: Adequate nutrition will be maintained Outcome: Progressing

## 2023-08-21 NOTE — Progress Notes (Signed)
SUBJECTIVE: Patient no longer having palpitation or shortness of breath.   Vitals:   08/21/23 0200 08/21/23 0425 08/21/23 0600 08/21/23 0802  BP:  106/74  102/86  Pulse:  (!) 120  (!) 119  Resp: 20 18 17 16   Temp:  98.2 F (36.8 C)  97.6 F (36.4 C)  TempSrc:    Oral  SpO2:  92%  96%  Weight:      Height:        Intake/Output Summary (Last 24 hours) at 08/21/2023 1105 Last data filed at 08/21/2023 1022 Gross per 24 hour  Intake 460 ml  Output 1250 ml  Net -790 ml    LABS: Basic Metabolic Panel: Recent Labs    08/19/23 1259 08/20/23 0442  NA 140 139  K 4.9 4.4  CL 113* 112*  CO2 18* 17*  GLUCOSE 206* 220*  BUN 29* 31*  CREATININE 1.08* 1.15*  CALCIUM 9.0 8.7*  MG 2.2  --    Liver Function Tests: No results for input(s): "AST", "ALT", "ALKPHOS", "BILITOT", "PROT", "ALBUMIN" in the last 72 hours. No results for input(s): "LIPASE", "AMYLASE" in the last 72 hours. CBC: Recent Labs    08/19/23 1259 08/20/23 0442  WBC 5.8 10.4  NEUTROABS 4.9  --   HGB 14.4 12.7  HCT 44.1 38.5  MCV 97.4 96.5  PLT 151 169   Cardiac Enzymes: No results for input(s): "CKTOTAL", "CKMB", "CKMBINDEX", "TROPONINI" in the last 72 hours. BNP: Invalid input(s): "POCBNP" D-Dimer: No results for input(s): "DDIMER" in the last 72 hours. Hemoglobin A1C: Recent Labs    08/20/23 0442  HGBA1C 7.6*   Fasting Lipid Panel: No results for input(s): "CHOL", "HDL", "LDLCALC", "TRIG", "CHOLHDL", "LDLDIRECT" in the last 72 hours. Thyroid Function Tests: No results for input(s): "TSH", "T4TOTAL", "T3FREE", "THYROIDAB" in the last 72 hours.  Invalid input(s): "FREET3" Anemia Panel: No results for input(s): "VITAMINB12", "FOLATE", "FERRITIN", "TIBC", "IRON", "RETICCTPCT" in the last 72 hours.   PHYSICAL EXAM General: Well developed, well nourished, in no acute distress HEENT:  Normocephalic and atramatic Neck:  No JVD.  Lungs: Clear bilaterally to auscultation and percussion. Heart: HRRR  . Normal S1 and S2 without gallops or murmurs.  Abdomen: Bowel sounds are positive, abdomen soft and non-tender  Msk:  Back normal, normal gait. Normal strength and tone for age. Extremities: No clubbing, cyanosis or edema.   Neuro: Alert and oriented X 3. Psych:  Good affect, responds appropriately  TELEMETRY: Remains in atrial fibrillation with controlled ventricular rate  ASSESSMENT AND PLAN: Atrial fibrillation with rapid ventricular response rate on admission currently in A-fib with controlled rate.  Patient has history of mild coronary artery disease on cardiac catheterization had 40% left main disease and mild LAD and circumflex disease with ejection fraction at that time 50%.  Currently EF is 30 to 35% on echocardiogram done yesterday.  Patient is feeling better denies any chest pain palpitation or shortness of breath.  Agree with switching to p.o. amiodarone 400 twice daily for 10 days and then 400 daily for 10 days and then 200 mg daily.  Continue Eliquis.   ICD-10-CM   1. Chronic atrial fibrillation (HCC)  I48.20     2. Cystitis  N30.90       Principal Problem:   UTI due to Klebsiella species Active Problems:   Hypercholesterolemia   Chronic kidney disease   Paroxysmal atrial fibrillation with RVR (HCC)   Type 2 diabetes mellitus with chronic kidney disease (HCC)   Essential hypertension  Arteriosclerotic dementia with depression (HCC)   Dyslipidemia   DNR (do not resuscitate)    Adrian Blackwater, MD, [facility] 08/21/2023 11:05 AM

## 2023-08-21 NOTE — Evaluation (Signed)
Physical Therapy Evaluation Patient Details Name: Pamela [name] MRN: [id number] DOB: November 06, 1934 Today's Date: 08/21/2023  History of Present Illness  89yo with h/o PAF, chronic diastolic CHF, HTN, DM,and HLD who presented on 9/19 with tachycardia and palpitations, found to be in afib with RVR.  She was given Dilt x 1 and Lopressor as well as Macrobid and Ceftriaxone.  She was placed on Amiodarone drip and transitioned to PO per cardiology. She was found to have Klebsiella UTI but not sepsis.   Clinical Impression  Patient received asleep in bed with food tray in front of her and agreeable to PT upon arousal. She continued to be drowsy throughout session. Patient was oriented to self and able to report correct month and year, but not day. She was unable to provide an accurate history, stating she could not remember when asked about her previous mobility or living at [facility]. Prior level of function unclear. Patient's resting heart rate was elevated at 115 bpm at rest. RN said they had reported to cardiology who did not give any new orders, and nursing approved PT seeing patient. Upon PT evaluation, patient required min A to complete supine to sit with head of bed elevated and heavy UE support on bed rail with extended time and elevated heart rate. She attempted sit <> stand with Mod A to RW but was unable to fully extend knees and hips or center weight over feet. Her heart rate rose to 145 bpm and patient reported feeling bad, so PT assisted patient back to bed with total A. Patient's HR elevated as high as 147 bpm during session, and she complained about difficulty breathing when bed placed in trendelenburg position to help her boost up the bed. She also was incontinent of urine during stand attempt. NT arrived after patient had been returned to bed for vitals check and noted IV site had started bleeding. RN arrived upon request to attend to the bleeding IV and NT was helping patient with vitals and  clean up when PT exited the room. Patient appears to have experienced a decline in functional activity tolerance with elevated heart rate limiting further mobility attempts. She would benefit from continued PT upon discharge from hospital setting. Patient would benefit from skilled physical therapy to address impairments and functional limitations (see PT Problem List below) to work towards stated goals and return to PLOF or maximal functional independence.     If plan is discharge home, recommend the following: Two people to help with walking and/or transfers;Two people to help with bathing/dressing/bathroom;Direct supervision/assist for medications management;Help with stairs or ramp for entrance;Assist for transportation;Assistance with cooking/housework;Supervision due to cognitive status   Can travel by private vehicle   No    Equipment Recommendations None recommended by PT  Recommendations for Other Services       Functional Status Assessment Patient has had a recent decline in their functional status and demonstrates the ability to make significant improvements in function in a reasonable and predictable amount of time.     Precautions / Restrictions Precautions Precautions: Fall Restrictions Weight Bearing Restrictions: No      Mobility  Bed Mobility Overal bed mobility: Needs Assistance Bed Mobility: Supine to Sit, Sit to Supine     Supine to sit: Min assist, HOB elevated Sit to supine: Total assist   General bed mobility comments: Patient completed supine to sit with extended time to complete, HOB elevated, and pulling on bedrails heavily. Needed min A to scoot forwards to place  feet on floor. She required total A to go sit to supine after stand attempt.    Transfers Overall transfer level: Needs assistance Equipment used: Rolling walker (2 wheels) Transfers: Sit to/from Stand Sit to Stand: Mod assist           General transfer comment: Patient attempted sit  <> stand at edge of bed with RW and Mod A . Patient failed to fully extend hips and knees and was unable to bring weight forwards sufficiently to balance. She became upset, stating she was peeing, and reported feeling bad. Her HR increased to 147 bpm and she was helped back to bed by PT.    Ambulation/Gait               General Gait Details: unable to attempt ambulation safely  Stairs            Wheelchair Mobility     Tilt Bed    Modified Rankin (Stroke Patients Only)       Balance                                             Pertinent Vitals/Pain Pain Assessment Pain Assessment: No/denies pain    Home Living Family/patient expects to be discharged to:: Skilled nursing facility                   Additional Comments: Patient from [facility] per chart review. No family present to provide history. Patient unable to remember her prior level of function.    Prior Function                       Extremity/Trunk Assessment   Upper Extremity Assessment Upper Extremity Assessment: Generalized weakness    Lower Extremity Assessment Lower Extremity Assessment: Generalized weakness       Communication   Communication Communication: Hearing impairment;Difficulty communicating thoughts/reduced clarity of speech Cueing Techniques: Verbal cues;Gestural cues;Tactile cues;Visual cues  Cognition Arousal: Lethargic Behavior During Therapy: WFL for tasks assessed/performed, Anxious                                   General Comments: started moaning rythmicly while PT and NT attended to her after she reported she felt bad following stand attempt.        General Comments      Exercises Other Exercises Other Exercises: patient educated in role of PT in acute care setting, safe transfer techniques.   Assessment/Plan    PT Assessment Patient needs continued PT services  PT Problem List Decreased  strength;Decreased mobility;Decreased safety awareness;Decreased range of motion;Decreased coordination;Decreased knowledge of precautions;Decreased activity tolerance;Decreased cognition;Cardiopulmonary status limiting activity;Decreased balance;Decreased knowledge of use of DME       PT Treatment Interventions DME instruction;Gait training;Functional mobility training;Therapeutic activities;Patient/family education;Cognitive remediation;Neuromuscular re-education;Balance training;Therapeutic exercise    PT Goals (Current goals can be found in the Care Plan section)  Acute Rehab PT Goals Patient Stated Goal: feel better PT Goal Formulation: With patient Time For Goal Achievement: 09/04/23 Potential to Achieve Goals: Good    Frequency Min 2X/week     Co-evaluation               AM-PAC PT "6 Clicks" Mobility  Outcome Measure Help needed turning from your back to your side  while in a flat bed without using bedrails?: A Lot Help needed moving from lying on your back to sitting on the side of a flat bed without using bedrails?: A Lot Help needed moving to and from a bed to a chair (including a wheelchair)?: Total Help needed standing up from a chair using your arms (e.g., wheelchair or bedside chair)?: A Lot Help needed to walk in hospital room?: Total Help needed climbing 3-5 steps with a railing? : Total 6 Click Score: 9    End of Session Equipment Utilized During Treatment: Gait belt Activity Tolerance: Treatment limited secondary to medical complications (Comment) (tachycardia, feeling poorly) Patient left: in bed;with nursing/sitter in room Nurse Communication: Mobility status PT Visit Diagnosis: Difficulty in walking, not elsewhere classified (R26.2);Muscle weakness (generalized) (M62.81)    Time: 1127-1200 PT Time Calculation (min) (ACUTE ONLY): 33 min   Charges:     PT Treatments $Therapeutic Activity: 8-22 mins PT General Charges $$ ACUTE PT VISIT: 1 Visit          Pamela [name]. Ilsa Iha, PT, DPT 08/21/23, 1:04 PM

## 2023-08-21 NOTE — Progress Notes (Signed)
Progress Note   Patient: Pamela [name] GMW:[id number] DOB: June 26, 1934 DOA: 08/19/2023     2 DOS: the patient was seen and examined on 08/21/2023   Brief hospital course: No notes on file  Assessment and Plan: Klebsiella pneumoniae UTI Sepsis has been ruled out UTI might have triggered RVR, Continue Cefepime therapy. Blood cultures so far negative. Urine culture sensitivities reviewed.   Paroxysmal atrial fibrillation with RVR (HCC) IV amiodarone drip transition to 400 mg twice daily PO per cardiology Continue metoprolol. HR >100 noted. Continue telemetry. Continue Eliquis Cardiology follow-up appreciated.   Acute on chronic systolic and diastolic CHF She got IV Lasix yesterday with improvement of her respiratory symptoms. Caution with IV fluids, watch for fluid overload.  Continue to monitor daily weights, strict input and output. Echo reviewed shows EF 30 to 35%, grade 2 diastolic dysfunction. She is euvolemic now, may need low dose Lasix daily.   Type 2 diabetes mellitus with chronic kidney disease (HCC) hold Tradjenta Continue 70/30 BID Cover with moderate-scale SSI    Dyslipidemia She is on Crestor therapy (at home she takes Lipitor)   Arteriosclerotic dementia with depression (HCC) Continue Namenda, sertraline, mirtazepine Continue prn Haldol   Essential hypertension Continue Toprol XL   CAD Continue Eliquis, crestor   Stage 3a/b CKD Kidney function stable. Avoid nephrotoxic medications Recheck BMP in AM         Subjective: Patient is seen and examined today morning.  She is lying comfortably, eating her breakfast.  Denies any abdominal discomfort, nausea or vomiting.  Did not get out of bed.  Physical Exam: Vitals:   08/21/23 0425 08/21/23 0600 08/21/23 0802 08/21/23 1201  BP: 106/74  102/86 125/84  Pulse: (!) 120  (!) 119 (!) 124  Resp: 18 17 16 18   Temp: 98.2 F (36.8 C)  97.6 F (36.4 C) 98.4 F (36.9 C)  TempSrc:   Oral Oral  SpO2: 92%   96% 96%  Weight:      Height:       General - Elderly Caucasian female, no apparent distress HEENT - PERRLA, EOMI, atraumatic head, non tender sinuses. Lung - Clear, bibasilar crackles Heart - S1, S2 heard, no murmurs, rubs, trace pedal edema Neuro - Alert, awake and oriented x 3, non focal exam. Skin - Warm and dry.  Data Reviewed:     Latest Ref Rng & Units 08/20/2023    4:42 AM 08/19/2023   12:59 PM 03/11/2022   10:32 AM  CBC  WBC 4.0 - 10.5 K/uL 10.4  5.8  6.9   Hemoglobin 12.0 - 15.0 g/dL 44.0  34.7  42.5   Hematocrit 36.0 - 46.0 % 38.5  44.1  43.1   Platelets 150 - 400 K/uL 169  151  181        Latest Ref Rng & Units 08/20/2023    4:42 AM 08/19/2023   12:59 PM 03/11/2022   10:32 AM  BMP  Glucose 70 - 99 mg/dL 956  387  564   BUN 8 - 23 mg/dL 31  29  37   Creatinine 0.44 - 1.00 mg/dL 3.32  9.51  8.84   Sodium 135 - 145 mmol/L 139  140  141   Potassium 3.5 - 5.1 mmol/L 4.4  4.9  3.9   Chloride 98 - 111 mmol/L 112  113  105   CO2 22 - 32 mmol/L 17  18  25    Calcium 8.9 - 10.3 mg/dL 8.7  9.0  9.4  Progress Note   Patient: Pamela [name] GMW:[id number] DOB: June 26, 1934 DOA: 08/19/2023     2 DOS: the patient was seen and examined on 08/21/2023   Brief hospital course: No notes on file  Assessment and Plan: Klebsiella pneumoniae UTI Sepsis has been ruled out UTI might have triggered RVR, Continue Cefepime therapy. Blood cultures so far negative. Urine culture sensitivities reviewed.   Paroxysmal atrial fibrillation with RVR (HCC) IV amiodarone drip transition to 400 mg twice daily PO per cardiology Continue metoprolol. HR >100 noted. Continue telemetry. Continue Eliquis Cardiology follow-up appreciated.   Acute on chronic systolic and diastolic CHF She got IV Lasix yesterday with improvement of her respiratory symptoms. Caution with IV fluids, watch for fluid overload.  Continue to monitor daily weights, strict input and output. Echo reviewed shows EF 30 to 35%, grade 2 diastolic dysfunction. She is euvolemic now, may need low dose Lasix daily.   Type 2 diabetes mellitus with chronic kidney disease (HCC) hold Tradjenta Continue 70/30 BID Cover with moderate-scale SSI    Dyslipidemia She is on Crestor therapy (at home she takes Lipitor)   Arteriosclerotic dementia with depression (HCC) Continue Namenda, sertraline, mirtazepine Continue prn Haldol   Essential hypertension Continue Toprol XL   CAD Continue Eliquis, crestor   Stage 3a/b CKD Kidney function stable. Avoid nephrotoxic medications Recheck BMP in AM         Subjective: Patient is seen and examined today morning.  She is lying comfortably, eating her breakfast.  Denies any abdominal discomfort, nausea or vomiting.  Did not get out of bed.  Physical Exam: Vitals:   08/21/23 0425 08/21/23 0600 08/21/23 0802 08/21/23 1201  BP: 106/74  102/86 125/84  Pulse: (!) 120  (!) 119 (!) 124  Resp: 18 17 16 18   Temp: 98.2 F (36.8 C)  97.6 F (36.4 C) 98.4 F (36.9 C)  TempSrc:   Oral Oral  SpO2: 92%   96% 96%  Weight:      Height:       General - Elderly Caucasian female, no apparent distress HEENT - PERRLA, EOMI, atraumatic head, non tender sinuses. Lung - Clear, bibasilar crackles Heart - S1, S2 heard, no murmurs, rubs, trace pedal edema Neuro - Alert, awake and oriented x 3, non focal exam. Skin - Warm and dry.  Data Reviewed:     Latest Ref Rng & Units 08/20/2023    4:42 AM 08/19/2023   12:59 PM 03/11/2022   10:32 AM  CBC  WBC 4.0 - 10.5 K/uL 10.4  5.8  6.9   Hemoglobin 12.0 - 15.0 g/dL 44.0  34.7  42.5   Hematocrit 36.0 - 46.0 % 38.5  44.1  43.1   Platelets 150 - 400 K/uL 169  151  181        Latest Ref Rng & Units 08/20/2023    4:42 AM 08/19/2023   12:59 PM 03/11/2022   10:32 AM  BMP  Glucose 70 - 99 mg/dL 956  387  564   BUN 8 - 23 mg/dL 31  29  37   Creatinine 0.44 - 1.00 mg/dL 3.32  9.51  8.84   Sodium 135 - 145 mmol/L 139  140  141   Potassium 3.5 - 5.1 mmol/L 4.4  4.9  3.9   Chloride 98 - 111 mmol/L 112  113  105   CO2 22 - 32 mmol/L 17  18  25    Calcium 8.9 - 10.3 mg/dL 8.7  9.0  9.4  ECHOCARDIOGRAM COMPLETE  Result Date: 08/20/2023    ECHOCARDIOGRAM REPORT   Patient Name:   Pamela [name] Date of Exam: 08/20/2023 Medical Rec #:  [id number]        Height:       67.0 in Accession #:    [id number]       Weight:       152.0 lb Date of Birth:  1934-10-09         BSA:          1.800 m Patient Age:    89 years         BP:           62/96 mmHg Patient Gender: F                HR:           105 bpm. Exam Location:  [facility] Procedure: 2D Echo, Cardiac Doppler, Color Doppler and Intracardiac            Opacification Agent Indications:     Atrial Fibrillation  History:         Patient has prior history of Echocardiogram examinations, most                  recent 11/29/2018. Previous Myocardial Infarction,                  Arrythmias:Atrial Fibrillation; Risk Factors:Hypertension,                  Diabetes and Dyslipidemia. CKD.  Sonographer:     Mikki Harbor Referring Phys:   [id number] JAN A MANSY Diagnosing Phys: Debbe Odea MD  Sonographer Comments: Technically difficult study due to poor echo windows. IMPRESSIONS  1. Left ventricular ejection fraction, by estimation, is 30 to 35%. The left ventricle has moderate to severely decreased function. The left ventricle demonstrates global hypokinesis. There is moderate left ventricular hypertrophy. Left ventricular diastolic parameters are consistent with Grade II diastolic dysfunction (pseudonormalization).  2. Right ventricular systolic function is low normal. The right ventricular size is not well visualized. There is moderately elevated pulmonary artery systolic pressure.  3. The mitral valve is degenerative. Mild mitral valve regurgitation. Mild mitral stenosis. The mean mitral valve gradient is 4.5 mmHg.  4. The aortic valve is tricuspid. Aortic valve regurgitation is not visualized. Aortic valve sclerosis is present, with no evidence of aortic valve stenosis.  5. The inferior vena cava is normal in size with <50% respiratory variability, suggesting right atrial pressure of 8 mmHg. FINDINGS  Left Ventricle: Left ventricular ejection fraction, by estimation, is 30 to 35%. The left ventricle has moderate to severely decreased function. The left ventricle demonstrates global hypokinesis. Definity contrast agent was given IV to delineate the left ventricular endocardial borders. The left ventricular internal cavity size was normal in size. There is moderate left ventricular hypertrophy. Left ventricular diastolic parameters are consistent with Grade II diastolic dysfunction (pseudonormalization). Right Ventricle: The right ventricular size is not well visualized. No increase in right ventricular wall thickness. Right ventricular systolic function is low normal. There is moderately elevated pulmonary artery systolic pressure. The tricuspid regurgitant velocity is 3.13 m/s, and with an assumed right atrial pressure of 8 mmHg, the  estimated right ventricular systolic pressure is 47.2 mmHg. Left Atrium: Left atrial size was normal in size. Right Atrium: Right atrial size was normal in size. Pericardium: There is no evidence of pericardial effusion. Mitral Valve: The mitral valve is degenerative in appearance.  ECHOCARDIOGRAM COMPLETE  Result Date: 08/20/2023    ECHOCARDIOGRAM REPORT   Patient Name:   Pamela [name] Date of Exam: 08/20/2023 Medical Rec #:  [id number]        Height:       67.0 in Accession #:    [id number]       Weight:       152.0 lb Date of Birth:  1934-10-09         BSA:          1.800 m Patient Age:    89 years         BP:           62/96 mmHg Patient Gender: F                HR:           105 bpm. Exam Location:  [facility] Procedure: 2D Echo, Cardiac Doppler, Color Doppler and Intracardiac            Opacification Agent Indications:     Atrial Fibrillation  History:         Patient has prior history of Echocardiogram examinations, most                  recent 11/29/2018. Previous Myocardial Infarction,                  Arrythmias:Atrial Fibrillation; Risk Factors:Hypertension,                  Diabetes and Dyslipidemia. CKD.  Sonographer:     Mikki Harbor Referring Phys:   [id number] JAN A MANSY Diagnosing Phys: Debbe Odea MD  Sonographer Comments: Technically difficult study due to poor echo windows. IMPRESSIONS  1. Left ventricular ejection fraction, by estimation, is 30 to 35%. The left ventricle has moderate to severely decreased function. The left ventricle demonstrates global hypokinesis. There is moderate left ventricular hypertrophy. Left ventricular diastolic parameters are consistent with Grade II diastolic dysfunction (pseudonormalization).  2. Right ventricular systolic function is low normal. The right ventricular size is not well visualized. There is moderately elevated pulmonary artery systolic pressure.  3. The mitral valve is degenerative. Mild mitral valve regurgitation. Mild mitral stenosis. The mean mitral valve gradient is 4.5 mmHg.  4. The aortic valve is tricuspid. Aortic valve regurgitation is not visualized. Aortic valve sclerosis is present, with no evidence of aortic valve stenosis.  5. The inferior vena cava is normal in size with <50% respiratory variability, suggesting right atrial pressure of 8 mmHg. FINDINGS  Left Ventricle: Left ventricular ejection fraction, by estimation, is 30 to 35%. The left ventricle has moderate to severely decreased function. The left ventricle demonstrates global hypokinesis. Definity contrast agent was given IV to delineate the left ventricular endocardial borders. The left ventricular internal cavity size was normal in size. There is moderate left ventricular hypertrophy. Left ventricular diastolic parameters are consistent with Grade II diastolic dysfunction (pseudonormalization). Right Ventricle: The right ventricular size is not well visualized. No increase in right ventricular wall thickness. Right ventricular systolic function is low normal. There is moderately elevated pulmonary artery systolic pressure. The tricuspid regurgitant velocity is 3.13 m/s, and with an assumed right atrial pressure of 8 mmHg, the  estimated right ventricular systolic pressure is 47.2 mmHg. Left Atrium: Left atrial size was normal in size. Right Atrium: Right atrial size was normal in size. Pericardium: There is no evidence of pericardial effusion. Mitral Valve: The mitral valve is degenerative in appearance.

## 2023-08-21 NOTE — Evaluation (Signed)
Occupational Therapy Evaluation Patient Details Name: Pamela [name] MRN: [id number] DOB: [birth year] Today's Date: 08/21/2023   History of Present Illness 89yo with h/o PAF, chronic diastolic CHF, HTN, DM,and HLD who presented on 9/19 with tachycardia and palpitations, found to be in afib with RVR.  She was given Dilt x 1 and Lopressor as well as Macrobid and Ceftriaxone.  She was placed on Amiodarone drip and transitioned to PO per cardiology. She was found to have Klebsiella UTI but not sepsis.   Clinical Impression   Pt seen this date for OT evaluation.  Per chart, pt currently resides at [facility].  Pt is a poor historian with no family present and is not able to confirm information but states, "I live here".  She reports requiring assistance for self care tasks with bathing and dressing and cannot recall her prior level of ambulation or use of assistive devices.  She was oriented to the month and year, not the day or place.  She required moderate assist with bed mobility and moderate assist with attempts to sit edge of bed but declined transfer and requested to finish her lunch meal which she was able to feed herself.  She has UE ROM WFLs bilaterally and is right handed.  She presents with muscle weakness, decreased transfers/functional mobility, and decreased ability to perform basic self care tasks such as bathing, dressing and toileting.  She would benefit from skilled OT services to maximize safety and independence in necessary daily tasks.          If plan is discharge home, recommend the following: A lot of help with walking and/or transfers;A lot of help with bathing/dressing/bathroom;Assistance with cooking/housework;Assist for transportation    Functional Status Assessment  Patient has had a recent decline in their functional status and demonstrates the ability to make significant improvements in function in a reasonable and predictable amount of time.  Equipment  Recommendations       Recommendations for Other Services       Precautions / Restrictions Precautions Precautions: Fall Restrictions Weight Bearing Restrictions: No      Mobility Bed Mobility Overal bed mobility: Needs Assistance Bed Mobility: Supine to Sit, Sit to Supine     Supine to sit: Mod assist Sit to supine: Max assist        Transfers                   General transfer comment: Did not perform sit to stand this date, pt declined and wanted to finish her meal      Balance Overall balance assessment: Needs assistance Sitting-balance support: Bilateral upper extremity supported Sitting balance-Leahy Scale: Fair                                     ADL either performed or assessed with clinical judgement   ADL Overall ADL's : Needs assistance/impaired Eating/Feeding: Set up Eating/Feeding Details (indicate cue type and reason): Pt able to feed herself her lunch, right handed and able to manage fork but very slow to perform Grooming: Set up;Bed level   Upper Body Bathing: Minimal assistance   Lower Body Bathing: Maximal assistance   Upper Body Dressing : Minimal assistance   Lower Body Dressing: Maximal assistance     Toilet Transfer Details (indicate cue type and reason): unable to perform this date           General ADL  Comments: Pt is a poor historian, not oriented to place, states, "I live here".  She reports she has a son nearby.  She recalls she requires assistance for self care tasks but cannot recall details of assist.     Vision         Perception         Praxis         Pertinent Vitals/Pain Pain Assessment Pain Assessment: No/denies pain Pain Score: 0-No pain     Extremity/Trunk Assessment Upper Extremity Assessment Upper Extremity Assessment: Generalized weakness   Lower Extremity Assessment Lower Extremity Assessment: Defer to PT evaluation       Communication Communication Communication:  Hearing impairment;Difficulty communicating thoughts/reduced clarity of speech Cueing Techniques: Verbal cues;Gestural cues;Tactile cues;Visual cues   Cognition Arousal: Alert Behavior During Therapy: WFL for tasks assessed/performed, Anxious Overall Cognitive Status: Difficult to assess                                       General Comments       Exercises     Shoulder Instructions      Home Living Family/patient expects to be discharged to:: Skilled nursing facility                                 Additional Comments: Patient from [facility] per chart review. No family present to provide history. Patient unable to remember her prior level of function.      Prior Functioning/Environment Prior Level of Function : Patient poor historian/Family not available             Mobility Comments: Pt reports she recalls using a walker for standing but cannot recall use of walker for ambulation. ADLs Comments: Pt reports she has someone who assists her with her bathing and dressing at [facility] but unable to recall details of what she is able to perform.        OT Problem List: Decreased strength;Decreased activity tolerance;Decreased cognition;Impaired balance (sitting and/or standing)      OT Treatment/Interventions: Self-care/ADL training;DME and/or AE instruction;Therapeutic activities;Balance training;Therapeutic exercise;Cognitive remediation/compensation;Patient/family education    OT Goals(Current goals can be found in the care plan section) Acute Rehab OT Goals Patient Stated Goal: Pt reports she wants to feel better, stronger OT Goal Formulation: With patient Time For Goal Achievement: 09/04/23 Potential to Achieve Goals: Fair ADL Goals Pt Will Perform Upper Body Bathing: with modified independence Pt Will Perform Lower Body Bathing: with min assist Pt Will Perform Upper Body Dressing: with modified independence Pt Will Perform Lower Body  Dressing: with min assist  OT Frequency: Min 2X/week    Co-evaluation              AM-PAC OT "6 Clicks" Daily Activity     Outcome Measure Help from another person eating meals?: None Help from another person taking care of personal grooming?: A Little Help from another person toileting, which includes using toliet, bedpan, or urinal?: A Lot Help from another person bathing (including washing, rinsing, drying)?: A Lot Help from another person to put on and taking off regular upper body clothing?: A Little Help from another person to put on and taking off regular lower body clothing?: A Lot 6 Click Score: 16   End of Session    Activity Tolerance: Patient limited by fatigue;Treatment limited secondary  to agitation Patient left: in bed;with call bell/phone within reach;with bed alarm set  OT Visit Diagnosis: Unsteadiness on feet (R26.81);Muscle weakness (generalized) (M62.81)                Time: 1520-1540 OT Time Calculation (min): 20 min Charges:  OT General Charges $OT Visit: 1 Visit OT Evaluation $OT Eval Moderate Complexity: 1 Mod Pamela [name], OTR/L, CLT  Olanda Boughner 08/21/2023, 4:25 PM

## 2023-08-22 DIAGNOSIS — E1122 Type 2 diabetes mellitus with diabetic chronic kidney disease: Secondary | ICD-10-CM | POA: Diagnosis not present

## 2023-08-22 DIAGNOSIS — E785 Hyperlipidemia, unspecified: Secondary | ICD-10-CM | POA: Diagnosis not present

## 2023-08-22 DIAGNOSIS — I4891 Unspecified atrial fibrillation: Secondary | ICD-10-CM | POA: Diagnosis not present

## 2023-08-22 DIAGNOSIS — I480 Paroxysmal atrial fibrillation: Secondary | ICD-10-CM | POA: Diagnosis not present

## 2023-08-22 DIAGNOSIS — N390 Urinary tract infection, site not specified: Secondary | ICD-10-CM | POA: Diagnosis not present

## 2023-08-22 LAB — URINE CULTURE: Culture: >=100000 — AB

## 2023-08-22 LAB — GLUCOSE, CAPILLARY
Glucose-Capillary: 179 mg/dL — ABNORMAL HIGH (ref 70–99)
Glucose-Capillary: 200 mg/dL — ABNORMAL HIGH (ref 70–99)
Glucose-Capillary: 170 mg/dL — ABNORMAL HIGH (ref 70–99)
Glucose-Capillary: 130 mg/dL — ABNORMAL HIGH (ref 70–99)

## 2023-08-22 MED ORDER — LISINOPRIL 5 MG PO TABS
5.0000 mg | ORAL_TABLET | Freq: Every day | ORAL | Status: DC
  Administered 2023-08-22 – 2023-08-23 (×2): 5 mg via ORAL
  Filled 2023-08-22 (×2): qty 1

## 2023-08-22 MED ORDER — DAPAGLIFLOZIN PROPANEDIOL 10 MG PO TABS
10.0000 mg | ORAL_TABLET | Freq: Every day | ORAL | Status: DC
  Administered 2023-08-22 – 2023-08-25 (×4): 10 mg via ORAL
  Filled 2023-08-22 (×4): qty 1

## 2023-08-22 NOTE — Progress Notes (Addendum)
Progress Note   Patient: Pamela [name] ZOX:[id number] DOB: 17-Dec-1933 DOA: 08/19/2023     3 DOS: the patient was seen and examined on 08/22/2023   Brief hospital course: No notes on file  Assessment and Plan: Klebsiella pneumoniae UTI Sepsis has been ruled out UTI might have triggered RVR, Cefepime therapy changed to Rocephin. Blood cultures so far negative. Urine culture sensitivities reviewed.   Paroxysmal atrial fibrillation with RVR (HCC) conyinue 400 mg twice daily PO per cardiology Continue metoprolol. HR ~100 noted. Continue telemetry. Continue Eliquis Cardiology follow-up appreciated.   Acute on chronic systolic and diastolic CHF She got IV Lasix yesterday with improvement of her respiratory symptoms. Caution with IV fluids, watch for fluid overload.  Continue to monitor daily weights, strict input and output. Echo reviewed shows EF 30 to 35%, grade 2 diastolic dysfunction. Farxiga, lisinopril started by cardiology.   Type 2 diabetes mellitus with chronic kidney disease (HCC) hold Tradjenta Continue 70/30 BID Cover with moderate-scale SSI    Dyslipidemia She is on Crestor therapy (at home she takes Lipitor)   Arteriosclerotic dementia with depression (HCC) Continue Namenda, sertraline, mirtazepine Continue prn Haldol   Essential hypertension Continue Toprol XL   CAD Continue Eliquis, crestor   Stage 3a/b CKD Kidney function stable. Avoid nephrotoxic medications Recheck BMP in AM         Subjective: Patient is seen and examined today morning.  She is lying comfortably, eating her breakfast.  Denies any abdominal discomfort, nausea or vomiting.  Did not get out of bed.  Physical Exam: Vitals:   08/22/23 0856 08/22/23 1117 08/22/23 1207 08/22/23 1538  BP: (!) 129/98 117/86 122/83 (!) 118/95  Pulse: (!) 118 (!) 112 93 (!) 103  Resp:  17 16 17   Temp:  98.5 F (36.9 C) 99 F (37.2 C) 97.9 F (36.6 C)  TempSrc:  Oral  Oral  SpO2:  93% 96% 93%   Weight:      Height:       General - Elderly Caucasian female, no apparent distress HEENT - PERRLA, EOMI, atraumatic head, non tender sinuses. Lung - Clear, bibasilar crackles Heart - S1, S2 heard, no murmurs, rubs, trace pedal edema Neuro - Alert, awake and oriented x 3, non focal exam. Skin - Warm and dry.  Data Reviewed:     Latest Ref Rng & Units 08/20/2023    4:42 AM 08/19/2023   12:59 PM 03/11/2022   10:32 AM  CBC  WBC 4.0 - 10.5 K/uL 10.4  5.8  6.9   Hemoglobin 12.0 - 15.0 g/dL 81.1  91.4  78.2   Hematocrit 36.0 - 46.0 % 38.5  44.1  43.1   Platelets 150 - 400 K/uL 169  151  181        Latest Ref Rng & Units 08/20/2023    4:42 AM 08/19/2023   12:59 PM 03/11/2022   10:32 AM  BMP  Glucose 70 - 99 mg/dL 956  213  086   BUN 8 - 23 mg/dL 31  29  37   Creatinine 0.44 - 1.00 mg/dL 5.78  4.69  6.29   Sodium 135 - 145 mmol/L 139  140  141   Potassium 3.5 - 5.1 mmol/L 4.4  4.9  3.9   Chloride 98 - 111 mmol/L 112  113  105   CO2 22 - 32 mmol/L 17  18  25    Calcium 8.9 - 10.3 mg/dL 8.7  9.0  9.4    No results found.  Family Communication: Discussed with patient's son at bedside, updated her care plan.  Disposition: Status is: Inpatient Remains inpatient appropriate because: IV antibiotics, rate control  Planned Discharge Destination: Skilled nursing facility    Time spent: 42 minutes  Author: Marcelino Duster, MD 08/22/2023 4:42 PM  For on call review [email or web address].

## 2023-08-22 NOTE — Progress Notes (Signed)
SUBJECTIVE: Patient denies any chest pain or shortness of breath but is feeling very sleepy today.   Vitals:   08/22/23 0807 08/22/23 0856 08/22/23 1117 08/22/23 1207  BP: (!) 122/101 (!) 129/98 117/86 122/83  Pulse: 96 (!) 118 (!) 112 93  Resp: 16  17 16   Temp: 97.6 F (36.4 C)  98.5 F (36.9 C) 99 F (37.2 C)  TempSrc:   Oral   SpO2: 98%  93% 96%  Weight:      Height:        Intake/Output Summary (Last 24 hours) at 08/22/2023 1225 Last data filed at 08/22/2023 1212 Gross per 24 hour  Intake 340 ml  Output 400 ml  Net -60 ml    LABS: Basic Metabolic Panel: Recent Labs    08/19/23 1259 08/20/23 0442  NA 140 139  K 4.9 4.4  CL 113* 112*  CO2 18* 17*  GLUCOSE 206* 220*  BUN 29* 31*  CREATININE 1.08* 1.15*  CALCIUM 9.0 8.7*  MG 2.2  --    Liver Function Tests: No results for input(s): "AST", "ALT", "ALKPHOS", "BILITOT", "PROT", "ALBUMIN" in the last 72 hours. No results for input(s): "LIPASE", "AMYLASE" in the last 72 hours. CBC: Recent Labs    08/19/23 1259 08/20/23 0442  WBC 5.8 10.4  NEUTROABS 4.9  --   HGB 14.4 12.7  HCT 44.1 38.5  MCV 97.4 96.5  PLT 151 169   Cardiac Enzymes: No results for input(s): "CKTOTAL", "CKMB", "CKMBINDEX", "TROPONINI" in the last 72 hours. BNP: Invalid input(s): "POCBNP" D-Dimer: No results for input(s): "DDIMER" in the last 72 hours. Hemoglobin A1C: Recent Labs    08/20/23 0442  HGBA1C 7.6*   Fasting Lipid Panel: No results for input(s): "CHOL", "HDL", "LDLCALC", "TRIG", "CHOLHDL", "LDLDIRECT" in the last 72 hours. Thyroid Function Tests: No results for input(s): "TSH", "T4TOTAL", "T3FREE", "THYROIDAB" in the last 72 hours.  Invalid input(s): "FREET3" Anemia Panel: No results for input(s): "VITAMINB12", "FOLATE", "FERRITIN", "TIBC", "IRON", "RETICCTPCT" in the last 72 hours.   PHYSICAL EXAM General: Well developed, well nourished, in no acute distress HEENT:  Normocephalic and atramatic Neck:  No JVD.   Lungs: Clear bilaterally to auscultation and percussion. Heart: HRRR . Normal S1 and S2 without gallops or murmurs.  Abdomen: Bowel sounds are positive, abdomen soft and non-tender  Msk:  Back normal, normal gait. Normal strength and tone for age. Extremities: No clubbing, cyanosis or edema.   Neuro: Alert and oriented X 3. Psych:  Good affect, responds appropriately  TELEMETRY: A-fib with ventricular rate about 100  ASSESSMENT AND PLAN: #1 atrial fibrillation with controlled ventricular rate on amiodarone 400 twice daily.  Continue Eliquis. #2 severe LV dysfunction echocardiogram revealed ejection fraction 30 to 35%, will start the patient on Farxiga and lisinopril.   ICD-10-CM   1. Chronic atrial fibrillation (HCC)  I48.20     2. Cystitis  N30.90       Principal Problem:   UTI due to Klebsiella species Active Problems:   Hypercholesterolemia   Chronic kidney disease   Paroxysmal atrial fibrillation with RVR (HCC)   Type 2 diabetes mellitus with chronic kidney disease (HCC)   Essential hypertension   Arteriosclerotic dementia with depression (HCC)   Dyslipidemia   DNR (do not resuscitate)    Pamela Blackwater, MD, [facility] 08/22/2023 12:25 PM

## 2023-08-23 DIAGNOSIS — N390 Urinary tract infection, site not specified: Secondary | ICD-10-CM | POA: Diagnosis not present

## 2023-08-23 DIAGNOSIS — E785 Hyperlipidemia, unspecified: Secondary | ICD-10-CM | POA: Diagnosis not present

## 2023-08-23 DIAGNOSIS — I480 Paroxysmal atrial fibrillation: Secondary | ICD-10-CM | POA: Diagnosis not present

## 2023-08-23 DIAGNOSIS — E1122 Type 2 diabetes mellitus with diabetic chronic kidney disease: Secondary | ICD-10-CM | POA: Diagnosis not present

## 2023-08-23 LAB — BASIC METABOLIC PANEL
Anion gap: 7 (ref 5–15)
BUN: 36 mg/dL — ABNORMAL HIGH (ref 8–23)
CO2: 21 mmol/L — ABNORMAL LOW (ref 22–32)
Calcium: 8.6 mg/dL — ABNORMAL LOW (ref 8.9–10.3)
Chloride: 112 mmol/L — ABNORMAL HIGH (ref 98–111)
Creatinine, Ser: 1.3 mg/dL — ABNORMAL HIGH (ref 0.44–1.00)
GFR, Estimated: 40 mL/min — ABNORMAL LOW (ref >60)
Glucose, Bld: 142 mg/dL — ABNORMAL HIGH (ref 70–99)
Potassium: 3.9 mmol/L (ref 3.5–5.1)
Sodium: 140 mmol/L (ref 135–145)

## 2023-08-23 LAB — CBC
HCT: 39 % (ref 36.0–46.0)
Hemoglobin: 12.6 g/dL (ref 12.0–15.0)
MCH: 31.8 pg (ref 26.0–34.0)
MCHC: 32.3 g/dL (ref 30.0–36.0)
MCV: 98.5 fL (ref 80.0–100.0)
Platelets: 145 10*3/uL — ABNORMAL LOW (ref 150–400)
RBC: 3.96 MIL/uL (ref 3.87–5.11)
RDW: 13.1 % (ref 11.5–15.5)
WBC: 5.8 10*3/uL (ref 4.0–10.5)
nRBC: 0 % (ref 0.0–0.2)

## 2023-08-23 LAB — MISC LABCORP TEST (SEND OUT): Labcorp test code: 104018

## 2023-08-23 LAB — GLUCOSE, CAPILLARY
Glucose-Capillary: 141 mg/dL — ABNORMAL HIGH (ref 70–99)
Glucose-Capillary: 168 mg/dL — ABNORMAL HIGH (ref 70–99)
Glucose-Capillary: 140 mg/dL — ABNORMAL HIGH (ref 70–99)
Glucose-Capillary: 99 mg/dL (ref 70–99)

## 2023-08-23 LAB — MAGNESIUM: Magnesium: 2.3 mg/dL (ref 1.7–2.4)

## 2023-08-23 MED ORDER — SODIUM CHLORIDE 0.9 % IV SOLN
1.0000 g | INTRAVENOUS | Status: AC
  Administered 2023-08-23 – 2023-08-24 (×2): 1 g via INTRAVENOUS
  Filled 2023-08-23 (×2): qty 10

## 2023-08-23 MED ORDER — SODIUM CHLORIDE 0.9 % IV SOLN
1.0000 g | INTRAVENOUS | Status: DC
  Filled 2023-08-23: qty 10

## 2023-08-23 NOTE — Progress Notes (Signed)
[facility] CLINIC CARDIOLOGY PROGRESS NOTE   Patient ID: Pamela [name] MRN: [id number] DOB/AGE: 08-Jan-1934 89 y.o.  Admit date: 08/19/2023 Referring Physician Dr. Ophelia Charter Primary Physician Dr. Laural Benes Primary Cardiologist Gwen Pounds Reason for Consultation atrial fibrillation RVR  HPI: Pamela [name] is a 89 y.o. female with a past medical history of paroxysmal atrial fibrillation, hypertension, and hyperlipidemia who presented to the ED on 08/19/2023 for palpitations and tachycardia. Cardiology was consulted for further evaluation.   Interval History:  -Patient resting comfortably in bed this AM without complaint, eating breakfast.  -Denies any palpitations, chest pain, shortness of breath.  -Converted to NSR overnight with rate in the 50s, back in atrial fibrillation this AM in the 110s.   Review of systems complete and found to be negative unless listed above    Vitals:   08/22/23 1538 08/22/23 1715 08/22/23 2207 08/23/23 0731  BP: (!) 118/95 124/88 122/84 (!) 112/56  Pulse: (!) 103 (!) 107 98 (!) 107  Resp: 17 16 (!) 21 16  Temp: 97.9 F (36.6 C) 97.6 F (36.4 C) 98.1 F (36.7 C) 98.7 F (37.1 C)  TempSrc: Oral  Oral Oral  SpO2: 93% 98% 99% 96%  Weight:      Height:         Intake/Output Summary (Last 24 hours) at 08/23/2023 1016 Last data filed at 08/23/2023 0730 Gross per 24 hour  Intake 100 ml  Output 750 ml  Net -650 ml     PHYSICAL EXAM General: Well appearing, well nourished, in no acute distress. HEENT: Normocephalic and atraumatic. Neck: No JVD.  Lungs: Normal respiratory effort on room air. Clear bilaterally to auscultation. No wheezes, crackles, rhonchi.  Heart: Irregularly irregular, fast rate. Normal S1 and S2 without gallops or murmurs. Radial & DP pulses 2+ bilaterally. Abdomen: Non-distended appearing.  Msk: Normal strength and tone for age. Extremities: No clubbing, cyanosis or edema.   Neuro: Alert and oriented X 3. Psych: Mood appropriate,  affect congruent.    LABS: Basic Metabolic Panel: Recent Labs    08/23/23 0423  NA 140  K 3.9  CL 112*  CO2 21*  GLUCOSE 142*  BUN 36*  CREATININE 1.30*  CALCIUM 8.6*   Liver Function Tests: No results for input(s): "AST", "ALT", "ALKPHOS", "BILITOT", "PROT", "ALBUMIN" in the last 72 hours. No results for input(s): "LIPASE", "AMYLASE" in the last 72 hours. CBC: Recent Labs    08/23/23 0423  WBC 5.8  HGB 12.6  HCT 39.0  MCV 98.5  PLT 145*   Cardiac Enzymes: No results for input(s): "CKTOTAL", "CKMB", "CKMBINDEX", "TROPONINIHS" in the last 72 hours. BNP: No results for input(s): "BNP" in the last 72 hours. D-Dimer: No results for input(s): "DDIMER" in the last 72 hours. Hemoglobin A1C: No results for input(s): "HGBA1C" in the last 72 hours. Fasting Lipid Panel: No results for input(s): "CHOL", "HDL", "LDLCALC", "TRIG", "CHOLHDL", "LDLDIRECT" in the last 72 hours. Thyroid Function Tests: No results for input(s): "TSH", "T4TOTAL", "T3FREE", "THYROIDAB" in the last 72 hours.  Invalid input(s): "FREET3" Anemia Panel: No results for input(s): "VITAMINB12", "FOLATE", "FERRITIN", "TIBC", "IRON", "RETICCTPCT" in the last 72 hours.  No results found.   ECHO 08/20/2023:  1. Left ventricular ejection fraction, by estimation, is 30 to 35%. The left ventricle has moderate to severely decreased function. The left ventricle demonstrates global hypokinesis. There is moderate left ventricular hypertrophy. Left ventricular diastolic parameters are consistent with Grade II diastolic dysfunction (pseudonormalization).   2. Right ventricular systolic function is low normal.  The right ventricular size is not well visualized. There is moderately elevated pulmonary artery systolic pressure.   3. The mitral valve is degenerative. Mild mitral valve regurgitation. Mild mitral stenosis. The mean mitral valve gradient is 4.5 mmHg.   4. The aortic valve is tricuspid. Aortic valve regurgitation  is not visualized. Aortic valve sclerosis is present, with no evidence of aortic valve stenosis.   5. The inferior vena cava is normal in size with <50% respiratory variability, suggesting right atrial pressure of 8 mmHg.   TELEMETRY reviewed by me 08/23/23: atrial fibrillation rate 110s, was in NSR/sinus brady with PVCs from midnight to 8 AM today.  EKG reviewed by me 08/23/23: atrial fibrillation RVR rate 132 bpm, LBBB  DATA reviewed by me 08/23/23: last 24h vitals tele labs imaging I/O hospitalist progress note  Principal Problem:   UTI due to Klebsiella species Active Problems:   Hypercholesterolemia   Chronic kidney disease   Paroxysmal atrial fibrillation with RVR (HCC)   Type 2 diabetes mellitus with chronic kidney disease (HCC)   Essential hypertension   Arteriosclerotic dementia with depression (HCC)   Dyslipidemia   DNR (do not resuscitate)    ASSESSMENT AND PLAN: [name] is a 89 y.o. female with a past medical history of paroxysmal Afib, hypertension, and hyperlipidemia who presented to the ED on 08/19/2023 for palpitations and tachycardia. Cardiology was consulted for further evaluation.   # Atrial fibrillation RVR # Hx paroxysmal atrial fibrillation Patient presented to the ED in AF RVR and was started on amiodarone infusion, transitioned to po on 9/20. Converted to NSR overnight on 9/23 but back in AF this AM with rates in the 110s at the time of my evaluation.  -Continue amiodarone 400 mg twice daily for 10 days then reduce to 200 mg bid.  -Continue metoprolol. Noted to be bradycardic in the 50s when she was in sinus rhythm overnight. -Continue eliquis 2.5 mg twice daily for stroke risk reduction (reduced dose due to history of bleeding on 5 mg)  # Acute on chronic HFmrEF Echo this admission with EF 30-35%, global hypokinesis. Appears euvolemic on exam.  -Continue lisinopril 5 mg, farxiga 10 mg, metoprolol as above. Consider increasing dose of lisinopril as BP  and renal function allow.   # Coronary artery disease Prior LHC 2019 with 55% LAD stenosis, no intervention at that time. Patient is without chest pain.  -Continue crestor 20 mg daily -Defer aspirin as patient is on eliquis  # Chronic kidney disease stage IIIb  Renal function with slight bump in Cr this AM to 1.3 from 1.15 yesterday.  -Continue to monitor renal function closely.   # Sepsis due to gram-negative UTI  -Management per primary  This patient's case was discussed and created with Dr. Darrold Junker and he is in agreement.  Signed:  Gale Journey, PA-C  08/23/2023, 10:16 AM [facility] Cardiology

## 2023-08-23 NOTE — Care Management Important Message (Signed)
Important Message  Patient Details  Name: Pamela [name] MRN: [id number] Date of Birth: 05/10/1934   Medicare Important Message Given:  Yes     Johnell Comings 08/23/2023, 11:24 AM

## 2023-08-23 NOTE — Progress Notes (Signed)
Progress Note   Patient: Pamela [name] ZOX:[id number] DOB: 1934-11-12 DOA: 08/19/2023     4 DOS: the patient was seen and examined on 08/23/2023   Brief hospital course: Pamela [name] is a 89yo with h/o PAF, chronic diastolic CHF, HTN, DM,and HLD who presented on 9/19 with tachycardia and palpitations, found to be in afib with RVR.  She was given Dilt x 1 and Lopressor as well as Macrobid and Ceftriaxone.  She was placed on Amiodarone drip.  During hospital stay she was treated for fluid overload IV Lasix with cessation of IVF. Her HR still elevated, amio changed to oral per cardiology, echo showed EF 30-35%, global hypokinesis, grade 2 diastolic dysfunction.  Assessment and Plan: Klebsiella pneumoniae UTI Sepsis has been ruled out UTI might have triggered RVR, Cefepime therapy changed to Rocephin. Blood cultures so far negative. Urine culture sensitivities reviewed.   Paroxysmal atrial fibrillation with RVR (HCC) Heart rate in 110s. conyinue 400 mg twice daily for 10 days and then 200 mg twice daily per cardiology Continue metoprolol. Continue telemetry. Continue Eliquis Cardiology follow-up appreciated.   Acute on chronic systolic and diastolic CHF She got IV Lasix with improvement of her respiratory symptoms. Caution with IV fluids, watch for fluid overload.  Continue to monitor daily weights, strict input and output. Echo reviewed shows EF 30 to 35%, grade 2 diastolic dysfunction. Continue Farxiga, lisinopril    Type 2 diabetes mellitus with chronic kidney disease (HCC) hold Tradjenta Continue 70/30 BID Cover with moderate-scale SSI    Dyslipidemia She is on Crestor therapy (at home she takes Lipitor)   Arteriosclerotic dementia with depression (HCC) Continue Namenda, sertraline, mirtazepine Continue prn Haldol   Essential hypertension Continue metoprolol, lisinopril.   CAD Continue Eliquis, crestor   Stage 3b CKD Kidney function stable. Avoid nephrotoxic  medications Monitor daily renal function        Subjective: Patient is seen and examined today morning.  She is lying comfortably, eating fair.  Son at bedside with whom I discussed her plan of care.  She is pleasant, hard of hearing  Physical Exam: Vitals:   08/22/23 2207 08/23/23 0731 08/23/23 1139 08/23/23 1516  BP: 122/84 (!) 112/56 138/86 124/82  Pulse: 98 (!) 107 97 95  Resp: (!) 21 16 16 16   Temp: 98.1 F (36.7 C) 98.7 F (37.1 C) 98.4 F (36.9 C) 99 F (37.2 C)  TempSrc: Oral Oral Oral Oral  SpO2: 99% 96% 95% 94%  Weight:      Height:       General - Elderly Caucasian female, no apparent distress HEENT - PERRLA, EOMI, atraumatic head, hard of hearing Lung - Clear, bibasilar crackles Heart - S1, S2 heard, no murmurs, rubs, trace pedal edema Neuro - Alert, awake and oriented x 3, non focal exam. Skin - Warm and dry.  Data Reviewed:     Latest Ref Rng & Units 08/23/2023    4:23 AM 08/20/2023    4:42 AM 08/19/2023   12:59 PM  CBC  WBC 4.0 - 10.5 K/uL 5.8  10.4  5.8   Hemoglobin 12.0 - 15.0 g/dL 81.1  91.4  78.2   Hematocrit 36.0 - 46.0 % 39.0  38.5  44.1   Platelets 150 - 400 K/uL 145  169  151        Latest Ref Rng & Units 08/23/2023    4:23 AM 08/20/2023    4:42 AM 08/19/2023   12:59 PM  BMP  Glucose 70 - 99 mg/dL  142  220  206   BUN 8 - 23 mg/dL 36  31  29   Creatinine 0.44 - 1.00 mg/dL 1.61  0.96  0.45   Sodium 135 - 145 mmol/L 140  139  140   Potassium 3.5 - 5.1 mmol/L 3.9  4.4  4.9   Chloride 98 - 111 mmol/L 112  112  113   CO2 22 - 32 mmol/L 21  17  18    Calcium 8.9 - 10.3 mg/dL 8.6  8.7  9.0    No results found.   Family Communication: Discussed with patient's son at bedside, updated her care plan.  Disposition: Status is: Inpatient Remains inpatient appropriate because: IV antibiotics, rate control  Planned Discharge Destination: Skilled nursing facility    Time spent: 40 minutes  Author: Marcelino Duster, MD 08/23/2023 4:58  PM  For on call review [email or web address].

## 2023-08-23 NOTE — Progress Notes (Signed)
Nutrition Follow-up  DOCUMENTATION CODES:   Not applicable  INTERVENTION:   -D/c Ensure Enlive po BID, each supplement provides 350 kcal and 20 grams of protein -Continue Glucerna Shake po TID, each supplement provides 220 kcal and 10 grams of protein  -Continue carb modified diet -Continue MVI with minerals daily  NUTRITION DIAGNOSIS:   Increased nutrient needs related to acute illness as evidenced by estimated needs.  Ongoing  GOAL:   Patient will meet greater than or equal to 90% of their needs  Progressing   MONITOR:   PO intake, Supplement acceptance  REASON FOR ASSESSMENT:   Malnutrition Screening Tool    ASSESSMENT:   Pt with medical history significant for paroxysmal atrial fibrillation, diastolic dysfunction, GERD, essential hypertension, type 2 diabetes mellitus, hypertension and GERD, who presented with acute onset of tachycardia and palpitations without chest pain or dyspnea.  Admitted to urinary frequency and urgency with mild dysuria without hematuria or flank pain.  Reviewed I/O's: -300 ml x 24 hours and +1.6 L since admission  UOP: 400 ml x 24 hours   Pt sleeping soundly at time of visit. She did not respond to voice or touch. Noted pt consumed about 50% of lunch meal. She is drinking Glucerna supplements.   No new wt since last visit.   Medications reviewed and include vitamin B-12, miralax, and senokot.   Labs reviewed: CBGS: 130-200 (inpatient orders for glycemic control are 0-5 units insulin aspart daily at bedtime, 0-9 units insulin aspart TID with meals, and 5 units insulin aspart protamine-aspart BID).    NUTRITION - FOCUSED PHYSICAL EXAM:  Flowsheet Row Most Recent Value  Orbital Region No depletion  Upper Arm Region No depletion  Thoracic and Lumbar Region No depletion  Buccal Region No depletion  Temple Region No depletion  Clavicle Bone Region No depletion  Clavicle and Acromion Bone Region No depletion  Scapular Bone Region No  depletion  Dorsal Hand No depletion  Patellar Region No depletion  Anterior Thigh Region No depletion  Posterior Calf Region No depletion  Edema (RD Assessment) Mild  Hair Reviewed  Eyes Reviewed  Mouth Reviewed  Skin Reviewed  Nails Reviewed       Diet Order:   Diet Order             Diet Carb Modified Fluid consistency: Thin; Room service appropriate? Yes  Diet effective now                   EDUCATION NEEDS:   No education needs have been identified at this time  Skin:  Skin Assessment: Reviewed RN Assessment  Last BM:  08/21/23 (type 3)  Height:   Ht Readings from Last 1 Encounters:  08/19/23 5\' 7"  (1.702 m)    Weight:   Wt Readings from Last 1 Encounters:  08/19/23 68.9 kg    Ideal Body Weight:  61.4 kg  BMI:  Body mass index is 23.81 kg/m.  Estimated Nutritional Needs:   Kcal:  1700-1900  Protein:  85-100 grams  Fluid:  > 1.7 L    Levada Schilling, RD, LDN, CDCES Registered Dietitian II Certified Diabetes Care and Education Specialist Please refer to [facility] for RD and/or RD on-call/weekend/after hours pager

## 2023-08-23 NOTE — Plan of Care (Signed)
  Problem: Clinical Measurements: Goal: Diagnostic test results will improve Outcome: Progressing Goal: Signs and symptoms of infection will decrease Outcome: Progressing   Problem: Respiratory: Goal: Ability to maintain adequate ventilation will improve Outcome: Progressing   

## 2023-08-24 DIAGNOSIS — E785 Hyperlipidemia, unspecified: Secondary | ICD-10-CM | POA: Diagnosis not present

## 2023-08-24 DIAGNOSIS — N390 Urinary tract infection, site not specified: Secondary | ICD-10-CM | POA: Diagnosis not present

## 2023-08-24 DIAGNOSIS — I480 Paroxysmal atrial fibrillation: Secondary | ICD-10-CM | POA: Diagnosis not present

## 2023-08-24 DIAGNOSIS — E1122 Type 2 diabetes mellitus with diabetic chronic kidney disease: Secondary | ICD-10-CM | POA: Diagnosis not present

## 2023-08-24 LAB — CBC
HCT: 36.7 % (ref 36.0–46.0)
Hemoglobin: 12.3 g/dL (ref 12.0–15.0)
MCH: 32.2 pg (ref 26.0–34.0)
MCHC: 33.5 g/dL (ref 30.0–36.0)
MCV: 96.1 fL (ref 80.0–100.0)
Platelets: 141 10*3/uL — ABNORMAL LOW (ref 150–400)
RBC: 3.82 MIL/uL — ABNORMAL LOW (ref 3.87–5.11)
RDW: 13 % (ref 11.5–15.5)
WBC: 5.3 10*3/uL (ref 4.0–10.5)
nRBC: 0 % (ref 0.0–0.2)

## 2023-08-24 LAB — BASIC METABOLIC PANEL
Anion gap: 9 (ref 5–15)
BUN: 31 mg/dL — ABNORMAL HIGH (ref 8–23)
CO2: 19 mmol/L — ABNORMAL LOW (ref 22–32)
Calcium: 8.7 mg/dL — ABNORMAL LOW (ref 8.9–10.3)
Chloride: 111 mmol/L (ref 98–111)
Creatinine, Ser: 1.22 mg/dL — ABNORMAL HIGH (ref 0.44–1.00)
GFR, Estimated: 43 mL/min — ABNORMAL LOW (ref >60)
Glucose, Bld: 119 mg/dL — ABNORMAL HIGH (ref 70–99)
Potassium: 4.1 mmol/L (ref 3.5–5.1)
Sodium: 139 mmol/L (ref 135–145)

## 2023-08-24 LAB — CULTURE, BLOOD (ROUTINE X 2)
Culture: NO GROWTH
Culture: NO GROWTH

## 2023-08-24 LAB — GLUCOSE, CAPILLARY
Glucose-Capillary: 124 mg/dL — ABNORMAL HIGH (ref 70–99)
Glucose-Capillary: 124 mg/dL — ABNORMAL HIGH (ref 70–99)
Glucose-Capillary: 122 mg/dL — ABNORMAL HIGH (ref 70–99)
Glucose-Capillary: 130 mg/dL — ABNORMAL HIGH (ref 70–99)

## 2023-08-24 LAB — MAGNESIUM: Magnesium: 2.1 mg/dL (ref 1.7–2.4)

## 2023-08-24 MED ORDER — AMIODARONE HCL 200 MG PO TABS
200.0000 mg | ORAL_TABLET | Freq: Two times a day (BID) | ORAL | Status: DC
  Administered 2023-08-24 – 2023-08-25 (×3): 200 mg via ORAL
  Filled 2023-08-24 (×3): qty 1

## 2023-08-24 MED ORDER — LISINOPRIL 10 MG PO TABS
10.0000 mg | ORAL_TABLET | Freq: Every day | ORAL | Status: DC
  Administered 2023-08-24: 10 mg via ORAL
  Filled 2023-08-24: qty 1

## 2023-08-24 NOTE — Progress Notes (Signed)
Occupational Therapy Treatment Patient Details Name: Pamela [name] MRN: [id number] DOB: [birth year] Today's Date: 08/24/2023   History of present illness 89yo with h/o PAF, chronic diastolic CHF, HTN, DM,and HLD who presented on 9/19 with tachycardia and palpitations, found to be in afib with RVR.  She was given Dilt x 1 and Lopressor as well as Macrobid and Ceftriaxone.  She was placed on Amiodarone drip and transitioned to PO per cardiology. She was found to have Klebsiella UTI but not sepsis.   OT comments  Chart reviewed to date, pt greeted in chair, agreeable to OT tx session targeting improving functional activity tolerance in preparation for toilet transfers. Grooming tasks completed with SET UP. Pt performed 4 STS with MIN-MOD A +2 with B knees blocked and multi modal cues for technique. HR into 140s with activity, declines further participation and requests to rest. Pt is making progress towards goals, OT will continue to follow acutely.       If plan is discharge home, recommend the following:  A lot of help with walking and/or transfers;A lot of help with bathing/dressing/bathroom;Assistance with cooking/housework;Assist for transportation   Equipment Recommendations  Other (comment) (per next venue of care)    Recommendations for Other Services      Precautions / Restrictions Precautions Precautions: Fall Restrictions Weight Bearing Restrictions: No       Mobility Bed Mobility               General bed mobility comments: NT in recliner pre/post session    Transfers Overall transfer level: Needs assistance Equipment used: 2 person hand held assist Transfers: Sit to/from Stand Sit to Stand: Min assist, Mod assist (4 attempts with B knees blocked first 2 attempts); multi modal cues for technique                  Balance Overall balance assessment: Needs assistance Sitting-balance support: Bilateral upper extremity supported Sitting balance-Leahy Scale:  Fair     Standing balance support: Bilateral upper extremity supported, During functional activity Standing balance-Leahy Scale: Poor                             ADL either performed or assessed with clinical judgement   ADL Overall ADL's : Needs assistance/impaired     Grooming: Sitting;Set up;Wash/dry face                                      Extremity/Trunk Assessment              Vision       Perception     Praxis      Cognition Arousal: Alert Behavior During Therapy: WFL for tasks assessed/performed Overall Cognitive Status: No family/caregiver present to determine baseline cognitive functioning Area of Impairment: Safety/judgement, Awareness, Problem solving                         Safety/Judgement: Decreased awareness of deficits Awareness: Emergent Problem Solving: Slow processing, Requires verbal cues, Requires tactile cues, Difficulty sequencing          Exercises      Shoulder Instructions       General Comments HR into 140s with activity, resting in 110s to 120s; RN notified    Pertinent Vitals/ Pain       Pain Assessment Pain Assessment: No/denies  pain  Home Living                                          Prior Functioning/Environment              Frequency  Min 1X/week        Progress Toward Goals  OT Goals(current goals can now be found in the care plan section)  Progress towards OT goals: Progressing toward goals     Plan      Co-evaluation                 AM-PAC OT "6 Clicks" Daily Activity     Outcome Measure   Help from another person eating meals?: None Help from another person taking care of personal grooming?: A Little Help from another person toileting, which includes using toliet, bedpan, or urinal?: A Lot Help from another person bathing (including washing, rinsing, drying)?: A Lot Help from another person to put on and taking off regular  upper body clothing?: A Little Help from another person to put on and taking off regular lower body clothing?: A Lot 6 Click Score: 16    End of Session    OT Visit Diagnosis: Unsteadiness on feet (R26.81);Muscle weakness (generalized) (M62.81)   Activity Tolerance Patient tolerated treatment well   Patient Left in chair;with call bell/phone within reach;with chair alarm set   Nurse Communication Other (comment) (vitals)        Time: 4098-1191 OT Time Calculation (min): 15 min  Charges: OT General Charges $OT Visit: 1 Visit OT Treatments $Therapeutic Activity: 8-22 mins  Oleta Mouse, OTD OTR/L  08/24/23, 3:53 PM

## 2023-08-24 NOTE — Progress Notes (Signed)
Progress Note   Patient: Pamela [name] OZD:[id number] DOB: [birth year] DOA: 08/19/2023     5 DOS: the patient was seen and examined on 08/24/2023   Brief hospital course: Pamela [name] is a 89yo with h/o PAF, chronic diastolic CHF, HTN, DM,and HLD who presented on 9/19 with tachycardia and palpitations, found to be in afib with RVR.  She was given Dilt x 1 and Lopressor as well as Macrobid and Ceftriaxone.  She was placed on Amiodarone drip.  During hospital stay she was treated for fluid overload IV Lasix with cessation of IVF. Her HR still elevated, amio changed to oral per cardiology, echo showed EF 30-35%, global hypokinesis, grade 2 diastolic dysfunction. Amiodarone dropped to 200mg  due to her bradycardia  Assessment and Plan: Klebsiella pneumoniae UTI Sepsis has been ruled out UTI might have triggered RVR, Continue Rocephin to finish 5 days Blood cultures so far negative. Urine culture sensitivities reviewed.   Paroxysmal atrial fibrillation with RVR (HCC) Heart rate in 50-110s. She was on Amiodarone gtt changed to 400mg  bid. She is in NSR, Amiodarone decreased to 200 mg twice daily per cardiology Continue metoprolol. Huston Foley noted on tele. Continue telemetry. Continue Eliquis 2.5 twice daily. Cardiology follow-up appreciated.   Acute on chronic systolic and diastolic CHF She got IV Lasix with improvement of her respiratory symptoms. Caution with IV fluids, watch for fluid overload.  She is euvolemic. Continue to monitor daily weights, strict input and output. Echo reviewed shows EF 30 to 35%, grade 2 diastolic dysfunction. Continue Farxiga, lisinopril.   Type 2 diabetes mellitus with chronic kidney disease (HCC) hold Tradjenta Continue 70/30 BID. Eating fair. Continue accucheks, sliding scale insulin per protocol/    Dyslipidemia Continue Crestor therapy (at home she takes Lipitor)   Arteriosclerotic dementia with depression (HCC) Continue Namenda, sertraline,  mirtazepine Continue prn Haldol   Essential hypertension Continue metoprolol, lisinopril.   CAD Continue Eliquis, crestor   Stage 3b CKD Kidney function stable. Avoid nephrotoxic medications Monitor daily renal function   PT/ OT recommended STR. TOC working on [employer]. She is from long term care.     Subjective: Patient is seen and examined today morning.  She is lying comfortably, eating fair.  Denies any complaints of chest pain, shortness of breath, dizziness.  Physical Exam: Vitals:   08/24/23 0343 08/24/23 0859 08/24/23 1254 08/24/23 1518  BP: (!) 105/53 129/61 (!) 129/98 (!) 139/106  Pulse: (!) 51 (!) 48 (!) 108 (!) 117  Resp: 18 16 16 16   Temp: 97.8 F (36.6 C) 97.6 F (36.4 C) 97.8 F (36.6 C) 97.8 F (36.6 C)  TempSrc:      SpO2: 94% 97% 97% 100%  Weight:      Height:       General - Elderly Caucasian female, no apparent distress HEENT - PERRLA, EOMI, atraumatic head, hard of hearing Lung - Clear, bibasilar crackles Heart - S1, S2 heard, no murmurs, rubs, trace pedal edema Neuro - Alert, awake and oriented x 3, non focal exam. Skin - Warm and dry.  Data Reviewed:     Latest Ref Rng & Units 08/24/2023    7:51 AM 08/23/2023    4:23 AM 08/20/2023    4:42 AM  CBC  WBC 4.0 - 10.5 K/uL 5.3  5.8  10.4   Hemoglobin 12.0 - 15.0 g/dL 25.9  56.3  87.5   Hematocrit 36.0 - 46.0 % 36.7  39.0  38.5   Platelets 150 - 400 K/uL 141  145  169        Latest Ref Rng & Units 08/24/2023    7:51 AM 08/23/2023    4:23 AM 08/20/2023    4:42 AM  BMP  Glucose 70 - 99 mg/dL 956  213  086   BUN 8 - 23 mg/dL 31  36  31   Creatinine 0.44 - 1.00 mg/dL 5.78  4.69  6.29   Sodium 135 - 145 mmol/L 139  140  139   Potassium 3.5 - 5.1 mmol/L 4.1  3.9  4.4   Chloride 98 - 111 mmol/L 111  112  112   CO2 22 - 32 mmol/L 19  21  17    Calcium 8.9 - 10.3 mg/dL 8.7  8.6  8.7    No results found.   Family Communication: Discussed with patient's son at bedside yesterday, updated  her care plan.  Disposition: Status is: Inpatient Remains inpatient appropriate because: rate control, TOC working on [employer]  Planned Discharge Destination: Skilled nursing facility    Time spent: 40 minutes  Author: Marcelino Duster, MD 08/24/2023 4:05 PM  For on call review [email or web address].

## 2023-08-24 NOTE — Plan of Care (Signed)
Problem: Fluid Volume: Goal: Hemodynamic stability will improve Outcome: Progressing   Problem: Clinical Measurements: Goal: Diagnostic test results will improve Outcome: Progressing   Problem: Respiratory: Goal: Ability to maintain adequate ventilation will improve Outcome: Progressing   Problem: Activity: Goal: Risk for activity intolerance will decrease Outcome: Progressing

## 2023-08-24 NOTE — TOC Progression Note (Signed)
Transition of Care [facility]) - Progression Note    Patient Details  Name: Pamela [name] MRN: [id number] Date of Birth: 05/24/1934  Transition of Care [facility]) CM/SW Contact  Truddie Hidden, RN Phone Number: 08/24/2023, 2:09 PM  Clinical Narrative:    Spoke with patient's son. Patient is LTC at [facility]. He was advised Berkley Harvey would be required for STR.        Expected Discharge Plan and Services                                               Social Determinants of Health (SDOH) Interventions SDOH Screenings   Tobacco Use: Low Risk  (08/19/2023)    Readmission Risk Interventions     No data to display

## 2023-08-24 NOTE — Progress Notes (Signed)
Physical Therapy Treatment Patient Details Name: Pamela [name] MRN: [id number] DOB: 02-21-1934 Today's Date: 08/24/2023   History of Present Illness 89yo with h/o PAF, chronic diastolic CHF, HTN, DM,and HLD who presented on 9/19 with tachycardia and palpitations, found to be in afib with RVR.  She was given Dilt x 1 and Lopressor as well as Macrobid and Ceftriaxone.  She was placed on Amiodarone drip and transitioned to PO per cardiology. She was found to have Klebsiella UTI but not sepsis.    PT Comments  Patient alert, agreeable to PT, denied pain. Pt oriented to self, place, but still unclear PLOF. Did describe where her WC is set up for transfers. minA for bed mobility and use of bed features. modA to step pivot (1 step) with a flexed trunk to recliner in room, chair arm dropped to improve ease. The patient would benefit from further skilled PT intervention to continue to progress towards goals.      If plan is discharge home, recommend the following: Two people to help with walking and/or transfers;Two people to help with bathing/dressing/bathroom;Direct supervision/assist for medications management;Help with stairs or ramp for entrance;Assist for transportation;Assistance with cooking/housework;Supervision due to cognitive status   Can travel by private vehicle     No  Equipment Recommendations  None recommended by PT    Recommendations for Other Services       Precautions / Restrictions Precautions Precautions: Fall Restrictions Weight Bearing Restrictions: No     Mobility  Bed Mobility Overal bed mobility: Needs Assistance Bed Mobility: Supine to Sit, Sit to Supine     Supine to sit: Min assist     General bed mobility comments: extended time, use of bed rails, minA for weight shift    Transfers Overall transfer level: Needs assistance Equipment used: Rolling walker (2 wheels) Transfers: Sit to/from Stand Sit to Stand: Mod assist           General transfer  comment: stand pivot to recliner, very effortful, flexed trunk    Ambulation/Gait               General Gait Details: pt does not ambulate at baseline   Stairs             Wheelchair Mobility     Tilt Bed    Modified Rankin (Stroke Patients Only)       Balance Overall balance assessment: Needs assistance Sitting-balance support: Bilateral upper extremity supported Sitting balance-Leahy Scale: Fair                                      Cognition Arousal: Alert Behavior During Therapy: WFL for tasks assessed/performed, Anxious Overall Cognitive Status: Within Functional Limits for tasks assessed                                 General Comments: oriented to self, place (hospital)        Exercises      General Comments        Pertinent Vitals/Pain Pain Assessment Pain Assessment: No/denies pain    Home Living                          Prior Function            PT Goals (current goals can now be found  in the care plan section) Progress towards PT goals: Progressing toward goals    Frequency    Min 2X/week      PT Plan      Co-evaluation              AM-PAC PT "6 Clicks" Mobility   Outcome Measure  Help needed turning from your back to your side while in a flat bed without using bedrails?: A Lot Help needed moving from lying on your back to sitting on the side of a flat bed without using bedrails?: A Lot Help needed moving to and from a bed to a chair (including a wheelchair)?: A Lot Help needed standing up from a chair using your arms (e.g., wheelchair or bedside chair)?: A Lot Help needed to walk in hospital room?: A Lot Help needed climbing 3-5 steps with a railing? : Total 6 Click Score: 11    End of Session Equipment Utilized During Treatment: Gait belt Activity Tolerance: Patient tolerated treatment well Patient left: in chair;with call bell/phone within reach;with chair alarm  set Nurse Communication: Mobility status PT Visit Diagnosis: Difficulty in walking, not elsewhere classified (R26.2);Muscle weakness (generalized) (M62.81)     Time: 1610-9604 PT Time Calculation (min) (ACUTE ONLY): 18 min  Charges:    $Therapeutic Activity: 8-22 mins PT General Charges $$ ACUTE PT VISIT: 1 Visit                     Olga Coaster PT, DPT 3:38 PM,08/24/23

## 2023-08-24 NOTE — TOC Progression Note (Addendum)
Transition of Care [facility]) - Progression Note    Patient Details  Name: Pamela [name] MRN: [id number] Date of Birth: 09/17/1934  Transition [facility] Care [facility]) CM/SW Contact  Truddie Hidden, RN Phone Number: 08/24/2023, 1:42 PM  Clinical Narrative:    Attempt to reach patient's son. No answer. Left a message.  FL2 completed. [facility] PASR # [id number] A   1:46pm Attempt to reach patient's son to discuss discharge plan. No answer. Left a message requesting return call to [facility].         Expected Discharge Plan and Services                                               Social Determinants of Health (SDOH) Interventions SDOH Screenings   Tobacco Use: Low Risk  (08/19/2023)    Readmission Risk Interventions     No data to display

## 2023-08-24 NOTE — [place] FL2 (Signed)
[place] MEDICAID FL2 LEVEL OF CARE FORM     IDENTIFICATION  Patient Name: Pamela [name] Birthdate: [birth year] Sex: female Admission Date (Current Location): 08/19/2023  [facility] and [place] Number:  Chiropodist and Address:  [facility], [address], [place], [place] [address]      Provider Number: [id number]  Attending Physician Name and Address:  Marcelino Duster, MD  Relative Name and Phone Number:  Chiann, Plaster)  [phone] [facility])    Current Level of Care: Hospital Recommended Level of Care: Skilled Nursing Facility Prior Approval Number:    Date Approved/Denied:   PASRR Number:    Discharge Plan: SNF    Current Diagnoses: Patient Active Problem List   Diagnosis Date Noted   DNR (do not resuscitate) 08/20/2023   UTI due to Klebsiella species 08/19/2023   Paroxysmal atrial fibrillation with RVR (HCC) 08/19/2023   Type 2 diabetes mellitus with chronic kidney disease (HCC) 08/19/2023   Essential hypertension 08/19/2023   Arteriosclerotic dementia with depression (HCC) 08/19/2023   Dyslipidemia 08/19/2023   NSTEMI (non-ST elevated myocardial infarction) (HCC) 11/28/2018   UTI (urinary tract infection) 11/28/2017   Pelvic muscle wasting 01/26/2017   Cystocele, midline 01/26/2017   Urinary frequency 12/26/2015   Incontinence 12/07/2015   Cystocele 12/07/2015   Tinea cruris 12/07/2015   Recurrent UTI 08/11/2015   Vaginal atrophy 08/11/2015   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/2015   Discoordination 05/08/2015   Frank hematuria 09/27/2014   Clinical depression 08/28/2014   Major depressive disorder with single episode 08/28/2014   Bursitis of knee 08/07/2014   Enthesopathy of knee 08/07/2014   Edema leg 07/02/2014   Bladder infection, chronic 06/29/2013   Incomplete bladder emptying 07/29/2012   FOM (frequency of micturition) 07/29/2012   Absence of bladder continence 07/29/2012   Chronic kidney disease 06/01/2012    Open wound of knee, leg (except thigh), and ankle 06/01/2012   Skin rash 05/20/2012   Acute kidney failure (HCC) 05/15/2012   Allergic drug rash 05/15/2012   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   Dermatitis due to drug taken internally 05/15/2012   Dermatitis due to drug reaction 05/15/2012   Acute renal failure (HCC) 05/15/2012   Diastolic dysfunction, left ventricle 05/14/2012   PAF (paroxysmal atrial fibrillation) (HCC) 05/13/2012   Anemia 05/13/2012   Cellulitis in diabetic foot (HCC) 05/12/2012   HTN (hypertension) 05/12/2012   Hypercholesterolemia 05/12/2012   Abscess or cellulitis of leg 04/18/2012   Diabetes mellitus (HCC) 04/18/2012   CN (constipation) 03/17/2012   Abnormal gait 02/08/2012   Benign essential HTN 02/08/2012   GERD (gastroesophageal reflux disease) 02/08/2012    Orientation RESPIRATION BLADDER Height & Weight     Self  Normal Incontinent Weight: 68.9 kg Height:  5\' 7"  (170.2 cm)  BEHAVIORAL SYMPTOMS/MOOD NEUROLOGICAL BOWEL NUTRITION STATUS      Incontinent Diet (Carb Modified)  AMBULATORY STATUS COMMUNICATION OF NEEDS Skin   Limited Assist Verbally Bruising (Bilateral arms)                       Personal Care Assistance Level of Assistance  Bathing Bathing Assistance: Limited assistance         Functional Limitations Info  Hearing   Hearing Info: Impaired      SPECIAL CARE FACTORS FREQUENCY  PT (By licensed PT), OT (By licensed OT)     PT Frequency: Min 2x weekly OT Frequency: Min 2x weekly  Contractures Contractures Info: Not present    Additional Factors Info  Code Status, Allergies Code Status Info: DNR Allergies Info: Ciprofloxacin, Sulfamethoxazole-trimethoprim, Augmentin (Amoxicillin-pot Clavulanate), Ampicillin, Hydrochlorothiazide, Levofloxacin, Losartan Potassium-hctz, Sulfa Antibiotics, Sulfasalazine, Levaquin (Levofloxacin In D5w)           Current Medications (08/24/2023):  This is the  current hospital active medication list Current Facility-Administered Medications  Medication Dose Route Frequency Provider Last Rate Last Admin   acetaminophen (TYLENOL) tablet 650 mg  650 mg Oral Q6H PRN Mansy, Jan A, MD   650 mg at 08/20/23 0800   Or   acetaminophen (TYLENOL) suppository 650 mg  650 mg Rectal Q6H PRN Mansy, Jan A, MD       albuterol (PROVENTIL) (2.5 MG/3ML) 0.083% nebulizer solution 2.5 mg  2.5 mg Nebulization Q2H PRN Jonah Blue, MD   2.5 mg at 08/20/23 0853   amiodarone (PACERONE) tablet 200 mg  200 mg Oral BID Hudson, Caralyn, PA-C   200 mg at 08/24/23 7035   apixaban (ELIQUIS) tablet 2.5 mg  2.5 mg Oral BID Mansy, Jan A, MD   2.5 mg at 08/24/23 0948   bisacodyl (DULCOLAX) EC tablet 5 mg  5 mg Oral Daily PRN Mansy, Jan A, MD       cefTRIAXone (ROCEPHIN) 1 g in sodium chloride 0.9 % 100 mL IVPB  1 g Intravenous Q24H Marcelino Duster, MD 200 mL/hr at 08/23/23 1706 1 g at 08/23/23 1706   cyanocobalamin (VITAMIN B12) tablet 1,000 mcg  1,000 mcg Oral Daily Mansy, Jan A, MD   1,000 mcg at 08/24/23 0948   dapagliflozin propanediol (FARXIGA) tablet 10 mg  10 mg Oral Daily Adrian Blackwater A, MD   10 mg at 08/24/23 0948   feeding supplement (GLUCERNA SHAKE) (GLUCERNA SHAKE) liquid 237 mL  237 mL Oral TID BM Marcelino Duster, MD   237 mL at 08/23/23 1709   haloperidol (HALDOL) tablet 1 mg  1 mg Oral Q6H PRN Jonah Blue, MD       insulin aspart (novoLOG) injection 0-5 Units  0-5 Units Subcutaneous QHS Mansy, Jan A, MD   4 Units at 08/20/23 0101   insulin aspart (novoLOG) injection 0-9 Units  0-9 Units Subcutaneous TID WC Mansy, Jan A, MD   1 Units at 08/24/23 0948   insulin aspart protamine- aspart (NOVOLOG MIX 70/30) injection 5 Units  5 Units Subcutaneous BID WC Mansy, Jan A, MD   5 Units at 08/24/23 0949   ipratropium-albuterol (DUONEB) 0.5-2.5 (3) MG/3ML nebulizer solution 3 mL  3 mL Nebulization Q12H PRN Jonah Blue, MD       lisinopril (ZESTRIL) tablet 10 mg   10 mg Oral Daily Hudson, Caralyn, PA-C   10 mg at 08/24/23 0948   magnesium hydroxide (MILK OF MAGNESIA) suspension 30 mL  30 mL Oral Daily PRN Mansy, Jan A, MD       memantine [facility]) tablet 10 mg  10 mg Oral Daily Mansy, Jan A, MD   10 mg at 08/24/23 0948   metoprolol succinate (TOPROL-XL) 24 hr tablet 25 mg  25 mg Oral Daily Mansy, Jan A, MD   25 mg at 08/24/23 0948   mirabegron ER (MYRBETRIQ) tablet 50 mg  50 mg Oral Daily Jonah Blue, MD   50 mg at 08/24/23 0948   mirtazapine (REMERON) tablet 7.5 mg  7.5 mg Oral Noemi Chapel, MD   7.5 mg at 08/23/23 2100   multivitamin with minerals tablet 1 tablet  1 tablet Oral Daily Sreeram,  Lynne Logan, MD   1 tablet at 08/24/23 0948   nitroGLYCERIN (NITROSTAT) SL tablet 0.4 mg  0.4 mg Sublingual Q5 min PRN Mansy, Jan A, MD       olopatadine (PATANOL) 0.1 % ophthalmic solution 1 drop  1 drop Both Eyes BID Jonah Blue, MD   1 drop at 08/24/23 0950   ondansetron (ZOFRAN) tablet 4 mg  4 mg Oral Q6H PRN Mansy, Jan A, MD       Or   ondansetron [facility]) injection 4 mg  4 mg Intravenous Q6H PRN Mansy, Jan A, MD   4 mg at 08/20/23 1759   polyethylene glycol (MIRALAX / GLYCOLAX) packet 17 g  17 g Oral Daily Jonah Blue, MD   17 g at 08/24/23 0947   rosuvastatin (CRESTOR) tablet 20 mg  20 mg Oral Daily Custovic, Sabina, DO   20 mg at 08/24/23 1610   senna-docusate (Senokot-S) tablet 1 tablet  1 tablet Oral BID Jonah Blue, MD   1 tablet at 08/24/23 0948   sertraline (ZOLOFT) tablet 25 mg  25 mg Oral Daily Jonah Blue, MD   25 mg at 08/24/23 0948   traMADol (ULTRAM) tablet 50 mg  50 mg Oral BID PRN Mansy, Jan A, MD   50 mg at 08/23/23 2053   traZODone (DESYREL) tablet 25 mg  25 mg Oral QHS PRN Mansy, Jan A, MD   25 mg at 08/23/23 2054     Discharge Medications: Please see discharge summary for a list of discharge medications.  Relevant Imaging Results:  Relevant Lab Results:   Additional Information SSN [id number]  Truddie Hidden, RN

## 2023-08-24 NOTE — Progress Notes (Signed)
[facility] CLINIC CARDIOLOGY PROGRESS NOTE   Patient ID: Pamela [name] MRN: [id number] DOB/AGE: 03/18/1934 89 y.o.  Admit date: 08/19/2023 Referring Physician Dr. Ophelia Charter Primary Physician Dr. Laural Benes Primary Cardiologist Gwen Pounds Reason for Consultation atrial fibrillation RVR  HPI: Pamela [name] is a 89 y.o. female with a past medical history of paroxysmal atrial fibrillation, hypertension, and hyperlipidemia who presented to the ED on 08/19/2023 for palpitations and tachycardia. Cardiology was consulted for further evaluation.   Interval History:  -Patient reports she is feeling well this AM.  -Denies any chest pain, shortness of breath, palpitations, dizziness.  -HR bradycardic overnight while in sinus rhythm, converted to AF again this AM with rates in the 90-100s. QT prolonged on tele.   Review of systems complete and found to be negative unless listed above    Vitals:   08/23/23 2049 08/23/23 2325 08/24/23 0343 08/24/23 0859  BP: 108/76 (!) 95/56 (!) 105/53 129/61  Pulse: (!) 102 90 (!) 51 (!) 48  Resp: 18 18 18 16   Temp: 97.9 F (36.6 C) 98.3 F (36.8 C) 97.8 F (36.6 C) 97.6 F (36.4 C)  TempSrc:      SpO2: 98% 96% 94% 97%  Weight:      Height:         Intake/Output Summary (Last 24 hours) at 08/24/2023 2595 Last data filed at 08/23/2023 2320 Gross per 24 hour  Intake 100 ml  Output 800 ml  Net -700 ml    PHYSICAL EXAM General: Well appearing, well nourished, in no acute distress sitting upright in hospital bed eating breakfast. HEENT: Normocephalic and atraumatic. Neck: No JVD.  Lungs: Normal respiratory effort on room air. Clear bilaterally to auscultation. No wheezes, crackles, rhonchi.  Heart: Irregularly irregular. Normal S1 and S2 without gallops or murmurs. Radial & DP pulses 2+ bilaterally. Abdomen: Non-distended appearing.  Msk: Normal strength and tone for age. Extremities: No clubbing, cyanosis or edema.   Neuro: Alert and oriented X 3. Psych:  Mood appropriate, affect congruent.    LABS: Basic Metabolic Panel: Recent Labs    08/23/23 0423 08/24/23 0751  NA 140 139  K 3.9 4.1  CL 112* 111  CO2 21* 19*  GLUCOSE 142* 119*  BUN 36* 31*  CREATININE 1.30* 1.22*  CALCIUM 8.6* 8.7*  MG 2.3 2.1   Liver Function Tests: No results for input(s): "AST", "ALT", "ALKPHOS", "BILITOT", "PROT", "ALBUMIN" in the last 72 hours. No results for input(s): "LIPASE", "AMYLASE" in the last 72 hours. CBC: Recent Labs    08/23/23 0423 08/24/23 0751  WBC 5.8 5.3  HGB 12.6 12.3  HCT 39.0 36.7  MCV 98.5 96.1  PLT 145* 141*   Cardiac Enzymes: No results for input(s): "CKTOTAL", "CKMB", "CKMBINDEX", "TROPONINIHS" in the last 72 hours. BNP: No results for input(s): "BNP" in the last 72 hours. D-Dimer: No results for input(s): "DDIMER" in the last 72 hours. Hemoglobin A1C: No results for input(s): "HGBA1C" in the last 72 hours. Fasting Lipid Panel: No results for input(s): "CHOL", "HDL", "LDLCALC", "TRIG", "CHOLHDL", "LDLDIRECT" in the last 72 hours. Thyroid Function Tests: No results for input(s): "TSH", "T4TOTAL", "T3FREE", "THYROIDAB" in the last 72 hours.  Invalid input(s): "FREET3" Anemia Panel: No results for input(s): "VITAMINB12", "FOLATE", "FERRITIN", "TIBC", "IRON", "RETICCTPCT" in the last 72 hours.  No results found.   ECHO 08/20/2023:  1. Left ventricular ejection fraction, by estimation, is 30 to 35%. The left ventricle has moderate to severely decreased function. The left ventricle demonstrates global hypokinesis. There is  moderate left ventricular hypertrophy. Left ventricular diastolic parameters are consistent with Grade II diastolic dysfunction (pseudonormalization).   2. Right ventricular systolic function is low normal. The right ventricular size is not well visualized. There is moderately elevated pulmonary artery systolic pressure.   3. The mitral valve is degenerative. Mild mitral valve regurgitation. Mild  mitral stenosis. The mean mitral valve gradient is 4.5 mmHg.   4. The aortic valve is tricuspid. Aortic valve regurgitation is not visualized. Aortic valve sclerosis is present, with no evidence of aortic valve stenosis.   5. The inferior vena cava is normal in size with <50% respiratory variability, suggesting right atrial pressure of 8 mmHg.   TELEMETRY reviewed by me 08/24/23: sinus rhythm rate 50s overnight, converted to atrial fibrillation this AM with rate 90-100s. QT noted to be long on tele  EKG reviewed by me 08/24/23: atrial fibrillation RVR rate 132 bpm, LBBB  DATA reviewed by me 08/24/23: last 24h vitals tele labs imaging I/O hospitalist progress note  Principal Problem:   UTI due to Klebsiella species Active Problems:   Hypercholesterolemia   Chronic kidney disease   Paroxysmal atrial fibrillation with RVR (HCC)   Type 2 diabetes mellitus with chronic kidney disease (HCC)   Essential hypertension   Arteriosclerotic dementia with depression (HCC)   Dyslipidemia   DNR (do not resuscitate)    ASSESSMENT AND PLAN: Pamela [name] is a 89 y.o. female with a past medical history of paroxysmal Afib, hypertension, and hyperlipidemia who presented to the ED on 08/19/2023 for palpitations and tachycardia. Cardiology was consulted for further evaluation.   # Atrial fibrillation RVR # Hx paroxysmal atrial fibrillation Patient presented to the ED in AF RVR and was started on amiodarone infusion, transitioned to po on 9/20. Converted to NSR overnight on 9/23 but back in AF this AM with rates in the 110s at the time of my evaluation.  -Decrease amiodarone to 200 mg bid given prolonged QT.  -EKG to assess QT. -Continue metoprolol. Bradycardic when in sinus rhythm - continue to monitor HR. -Continue eliquis 2.5 mg twice daily for stroke risk reduction (reduced dose due to history of bleeding on 5 mg)  # Acute on chronic HFmrEF Echo this admission with EF 30-35%, global hypokinesis.  Appears euvolemic on exam.  -Increase lisinopril to 10 mg daily. Continue farxiga 10 mg, metoprolol as above.   # Coronary artery disease Prior LHC 2019 with 55% LAD stenosis, no intervention at that time. Patient is without chest pain.  -Continue crestor 20 mg daily -Defer aspirin as patient is on eliquis  # Chronic kidney disease stage IIIb  Renal function improved this AM with Cr 1.2.  -Continue to monitor renal function closely.   # Sepsis due to gram-negative UTI  -Management per primary  This patient's case was discussed and created with Dr. Darrold Junker and he is in agreement.  Signed:  Gale Journey, PA-C  08/24/2023, 9:23 AM [facility] Cardiology

## 2023-08-25 DIAGNOSIS — N390 Urinary tract infection, site not specified: Secondary | ICD-10-CM | POA: Diagnosis not present

## 2023-08-25 DIAGNOSIS — I5043 Acute on chronic combined systolic (congestive) and diastolic (congestive) heart failure: Secondary | ICD-10-CM | POA: Diagnosis present

## 2023-08-25 DIAGNOSIS — B9689 Other specified bacterial agents as the cause of diseases classified elsewhere: Secondary | ICD-10-CM | POA: Diagnosis not present

## 2023-08-25 LAB — GLUCOSE, CAPILLARY
Glucose-Capillary: 148 mg/dL — ABNORMAL HIGH (ref 70–99)
Glucose-Capillary: 156 mg/dL — ABNORMAL HIGH (ref 70–99)
Glucose-Capillary: 134 mg/dL — ABNORMAL HIGH (ref 70–99)

## 2023-08-25 LAB — BASIC METABOLIC PANEL
Anion gap: 8 (ref 5–15)
BUN: 24 mg/dL — ABNORMAL HIGH (ref 8–23)
CO2: 22 mmol/L (ref 22–32)
Calcium: 8.9 mg/dL (ref 8.9–10.3)
Chloride: 109 mmol/L (ref 98–111)
Creatinine, Ser: 1.09 mg/dL — ABNORMAL HIGH (ref 0.44–1.00)
GFR, Estimated: 49 mL/min — ABNORMAL LOW (ref >60)
Glucose, Bld: 163 mg/dL — ABNORMAL HIGH (ref 70–99)
Potassium: 4.2 mmol/L (ref 3.5–5.1)
Sodium: 139 mmol/L (ref 135–145)

## 2023-08-25 MED ORDER — DAPAGLIFLOZIN PROPANEDIOL 10 MG PO TABS: 10.0000 mg | ORAL_TABLET | Freq: Every day | ORAL | 0 refills | Status: AC

## 2023-08-25 MED ORDER — AMIODARONE HCL 200 MG PO TABS: 200.0000 mg | ORAL_TABLET | Freq: Two times a day (BID) | ORAL | 0 refills | Status: AC

## 2023-08-25 MED ORDER — APIXABAN 2.5 MG PO TABS: 2.5000 mg | ORAL_TABLET | Freq: Two times a day (BID) | ORAL | 0 refills | Status: AC

## 2023-08-25 MED ORDER — LISINOPRIL 20 MG PO TABS: 20.0000 mg | ORAL_TABLET | Freq: Every day | ORAL | 0 refills | Status: AC

## 2023-08-25 MED ORDER — LISINOPRIL 20 MG PO TABS
20.0000 mg | ORAL_TABLET | Freq: Every day | ORAL | Status: DC
  Administered 2023-08-25: 20 mg via ORAL
  Filled 2023-08-25: qty 1

## 2023-08-25 MED ORDER — METOPROLOL SUCCINATE ER 25 MG PO TB24: 25.0000 mg | ORAL_TABLET | Freq: Every day | ORAL | 0 refills | Status: AC

## 2023-08-25 MED ORDER — SODIUM CHLORIDE 0.9 % IV SOLN
1.0000 g | INTRAVENOUS | Status: AC
  Administered 2023-08-25: 1 g via INTRAVENOUS
  Filled 2023-08-25: qty 10

## 2023-08-25 MED ORDER — GLUCERNA SHAKE PO LIQD: 237.0000 mL | Freq: Three times a day (TID) | ORAL | 0 refills | Status: AC

## 2023-08-25 NOTE — Progress Notes (Signed)
[facility] CLINIC CARDIOLOGY PROGRESS NOTE   Patient ID: Pamela [name] MRN: [id number] DOB/AGE: 1934/09/06 89 y.o.  Admit date: 08/19/2023 Referring Physician Dr. Ophelia Charter Primary Physician Dr. Laural Benes Primary Cardiologist Gwen Pounds Reason for Consultation atrial fibrillation RVR  HPI: Pamela [name] is a 89 y.o. female with a past medical history of paroxysmal atrial fibrillation, hypertension, and hyperlipidemia who presented to the ED on 08/19/2023 for palpitations and tachycardia. Cardiology was consulted for further evaluation.   Interval History:  -States she feels well this AM, eating breakfast at the time of my evaluation. -Remains without chest pain, shortness of breath, palpitations.  -Continues to convert in and out of atrial fibrillation. Rate relatively controlled with occasional short paroxysms of elevated rate.   Review of systems complete and found to be negative unless listed above    Vitals:   08/24/23 2307 08/24/23 2328 08/25/23 0330 08/25/23 0914  BP: 127/80 125/77 138/88 (!) 155/80  Pulse: 86 93 91 (!) 110  Resp: 20 20 18 16   Temp:  97.9 F (36.6 C) 97.9 F (36.6 C) (!) 97.5 F (36.4 C)  TempSrc:    Oral  SpO2: 95% 100% 97% 98%  Weight:      Height:         Intake/Output Summary (Last 24 hours) at 08/25/2023 1006 Last data filed at 08/25/2023 1308 Gross per 24 hour  Intake --  Output 500 ml  Net -500 ml    PHYSICAL EXAM General: Well appearing, well nourished, in no acute distress sitting upright in hospital bed eating breakfast. HEENT: Normocephalic and atraumatic. Neck: No JVD.  Lungs: Normal respiratory effort on 2L. Clear bilaterally to auscultation. No wheezes, crackles, rhonchi.  Heart: Irregularly irregular. Normal S1 and S2 without gallops or murmurs. Radial & DP pulses 2+ bilaterally. Abdomen: Non-distended appearing.  Msk: Normal strength and tone for age. Extremities: No clubbing, cyanosis or edema.   Neuro: Alert and oriented X  3. Psych: Mood appropriate, affect congruent.    LABS: Basic Metabolic Panel: Recent Labs    08/23/23 0423 08/24/23 0751  NA 140 139  K 3.9 4.1  CL 112* 111  CO2 21* 19*  GLUCOSE 142* 119*  BUN 36* 31*  CREATININE 1.30* 1.22*  CALCIUM 8.6* 8.7*  MG 2.3 2.1   Liver Function Tests: No results for input(s): "AST", "ALT", "ALKPHOS", "BILITOT", "PROT", "ALBUMIN" in the last 72 hours. No results for input(s): "LIPASE", "AMYLASE" in the last 72 hours. CBC: Recent Labs    08/23/23 0423 08/24/23 0751  WBC 5.8 5.3  HGB 12.6 12.3  HCT 39.0 36.7  MCV 98.5 96.1  PLT 145* 141*   Cardiac Enzymes: No results for input(s): "CKTOTAL", "CKMB", "CKMBINDEX", "TROPONINIHS" in the last 72 hours. BNP: No results for input(s): "BNP" in the last 72 hours. D-Dimer: No results for input(s): "DDIMER" in the last 72 hours. Hemoglobin A1C: No results for input(s): "HGBA1C" in the last 72 hours. Fasting Lipid Panel: No results for input(s): "CHOL", "HDL", "LDLCALC", "TRIG", "CHOLHDL", "LDLDIRECT" in the last 72 hours. Thyroid Function Tests: No results for input(s): "TSH", "T4TOTAL", "T3FREE", "THYROIDAB" in the last 72 hours.  Invalid input(s): "FREET3" Anemia Panel: No results for input(s): "VITAMINB12", "FOLATE", "FERRITIN", "TIBC", "IRON", "RETICCTPCT" in the last 72 hours.  No results found.   ECHO 08/20/2023:  1. Left ventricular ejection fraction, by estimation, is 30 to 35%. The left ventricle has moderate to severely decreased function. The left ventricle demonstrates global hypokinesis. There is moderate left ventricular hypertrophy. Left ventricular  diastolic parameters are consistent with Grade II diastolic dysfunction (pseudonormalization).   2. Right ventricular systolic function is low normal. The right ventricular size is not well visualized. There is moderately elevated pulmonary artery systolic pressure.   3. The mitral valve is degenerative. Mild mitral valve regurgitation.  Mild mitral stenosis. The mean mitral valve gradient is 4.5 mmHg.   4. The aortic valve is tricuspid. Aortic valve regurgitation is not visualized. Aortic valve sclerosis is present, with no evidence of aortic valve stenosis.   5. The inferior vena cava is normal in size with <50% respiratory variability, suggesting right atrial pressure of 8 mmHg.   TELEMETRY reviewed by me 08/25/23: sinus rhythm rate 50s overnight, converted to atrial fibrillation this AM with rate 90-100s. QT noted to be long on tele  EKG reviewed by me 08/25/23: atrial fibrillation RVR rate 132 bpm, LBBB  DATA reviewed by me 08/25/23: last 24h vitals tele labs imaging I/O hospitalist progress note  Principal Problem:   UTI due to Klebsiella species Active Problems:   Hypercholesterolemia   Chronic kidney disease   Paroxysmal atrial fibrillation with RVR (HCC)   Type 2 diabetes mellitus with chronic kidney disease (HCC)   Essential hypertension   Arteriosclerotic dementia with depression (HCC)   Dyslipidemia   DNR (do not resuscitate)    ASSESSMENT AND PLAN: Pamela [name] is a 89 y.o. female with a past medical history of paroxysmal Afib, hypertension, and hyperlipidemia who presented to the ED on 08/19/2023 for palpitations and tachycardia. Cardiology was consulted for further evaluation.   # Atrial fibrillation RVR # Hx paroxysmal atrial fibrillation Patient presented to the ED in AF RVR and was started on amiodarone infusion, transitioned to po on 9/20. Converting in and out of atrial fibrillation with rates in the 90-100s, bradycardic when in sinus. Remains asymptomatic.  -Continue amiodarone 200 mg twice daily. Monitor QT.  -Continue metoprolol. Bradycardic when in sinus rhythm - continue to monitor HR.  -Continue eliquis 2.5 mg twice daily for stroke risk reduction (reduced dose due to history of bleeding on 5 mg)  # Acute on chronic HFmrEF # Hypertension Echo this admission with EF 30-35%, global  hypokinesis (2019 EF 45-50%). Appears euvolemic on exam.  -Increase lisinopril to 20 mg daily. Continue farxiga 10 mg, metoprolol as above. Consider addition of MRA pending BP and renal function.   # Coronary artery disease Prior LHC 2019 with 55% LAD stenosis, no intervention at that time. Patient is without chest pain.  -Continue crestor 20 mg daily -Defer aspirin as patient is on eliquis  # Chronic kidney disease stage IIIb  Renal function improved this AM with Cr 1.2.  -Continue to monitor renal function closely.   # Sepsis due to gram-negative UTI  -Management per primary   Ok for discharge today from a cardiac perspective. Will arrange for follow up in clinic with Dr. Darrold Junker in 1-2 weeks.   This patient's case was discussed and created with Dr. Darrold Junker and he is in agreement.  Signed:  Gale Journey, PA-C  08/25/2023, 10:06 AM [facility] Cardiology

## 2023-08-25 NOTE — TOC Progression Note (Addendum)
Transition of Care [facility]) - Progression Note    Patient Details  Name: Pamela [name] MRN: [id number] Date of Birth: 11-03-1934  Transition of Care [facility]) CM/SW Contact  Truddie Hidden, RN Phone Number: 08/25/2023, 10:23 AM  Clinical Narrative:    Attempt to start [place]. Patient not found in [facility].  Spoke with Crystal @ [phone]. She was unable to find patient using her subscriber number  [phone]. She stated patient may no longer have a active plan. Crystal will contact [employer] directly at [phone] to determine eligibility and responsible Production assistant, radio. Crystal will contact RNCM or TOC department. She was provided the [facility] NPI.   11:00pm Retrieved a call from [employer] from [place]. Patient plan and eligibility verified. Patient's plan is managed by [facility].   11:54pm Spoke with Tammy from [facility] regarding auth. She stated patient has a Clinical research associate and Berkley Harvey can be obtained once patient is back in the building. MD notified.         Expected Discharge Plan and Services                                               Social Determinants of Health (SDOH) Interventions SDOH Screenings   Tobacco Use: Low Risk  (08/19/2023)    Readmission Risk Interventions     No data to display

## 2023-08-25 NOTE — TOC Transition Note (Addendum)
Transition of Care [facility]) - CM/SW Discharge Note   Patient Details  Name: Pamela [name] MRN: [id number] Date of Birth: [birth year]  Transition of Care [facility]) CM/SW Contact:  Truddie Hidden, RN Phone Number: 08/25/2023, 1:50 PM   Clinical Narrative:    Spoke with Tammy in admissions at [facility]  Per facility patient admission confirmed for today. Patient assigned room # 101-A Nurse will call report to [phone] Face sheet and medical necessity forms printed to the floor to be added to the EMS pack EMS arranged "She's fourth." Left message for patient's son Pamela [name]. Discharge summary and SNF transfer report sent in HUB.  TOC signing off.   Final next level of care: Skilled Nursing Facility Barriers to Discharge: Barriers Resolved   Patient Goals and CMS Choice      Discharge Placement                Patient chooses bed at: [facility] [place] Patient to be transferred to facility by: [facility] Name of family member notified: Pamela [name], son Patient and family notified of of transfer: 08/25/23  Discharge Plan and Services Additional resources added to the After Visit Summary for                                       Social Determinants of Health (SDOH) Interventions SDOH Screenings   Tobacco Use: Low Risk  (08/19/2023)     Readmission Risk Interventions     No data to display

## 2023-08-25 NOTE — Discharge Summary (Addendum)
Physician Discharge Summary   Patient: Pamela [name] MRN: [id number] DOB: 14-Mar-1934  Admit date:     08/19/2023  Discharge date: 08/25/23  Discharge Physician: Nigel Wessman   PCP: Gracelyn Nurse, MD   Recommendations at discharge:    Take medications as prescribed  Discharge Diagnoses: Principal Problem:   UTI due to Klebsiella species Active Problems:   Paroxysmal atrial fibrillation with RVR (HCC)   Type 2 diabetes mellitus with chronic kidney disease (HCC)   Dyslipidemia   Hypercholesterolemia   Chronic kidney disease   Essential hypertension   Arteriosclerotic dementia with depression (HCC)   DNR (do not resuscitate)   Acute on chronic combined systolic and diastolic CHF (congestive heart failure) (HCC)  Resolved Problems:   * No resolved hospital problems. *  Hospital Course: Pamela [name] is a 89 y.o. female with medical history significant for paroxysmal atrial fibrillation, diastolic dysfunction, GERD, essential hypertension, type 2 diabetes mellitus, hypertension and GERD, who presented to the emergency room with acute onset of tachycardia and palpitations without chest pain or dyspnea.  The patient's admitted to urinary frequency and urgency with mild dysuria without hematuria or flank pain.  No fever or chills.  She was noted to be in A-fib with RVR of 150 by EMS and was given 5 mg of IV diltiazem before coming to the ER.  No nausea or vomiting or abdominal pain.  No cough or wheezing or hemoptysis.  No other bleeding diathesis.   ED Course: When she came to the ER, BP was 126/93 with heart rate 106 with otherwise normal vital signs.  Labs revealed.  Later on respiratory rate was 25.  Labs revealed CO2 of 18 and glucose of 206 with a BUN of 29 creatinine 1.08 and CBC was within normal.  Lactic acid was 2.3 and later 1.5.  UA was positive for UTI.  Blood cultures and urine culture with sent. EKG as reviewed by me : EKG showed atrial flutter/fibrillation with a  rate of 107 with left bundle branch block. Imaging: None.   The patient was given 5 mg of IV Lopressor here and 25 mg p.o. as well as 100 mg of p.o. Macrobid initially, 1.5 L of IV normal saline followed by a gram of IV Rocephin.  Due to persistent rapid ventricular response with her atrial fibrillation she was placed on IV amiodarone with bolus and drip.  She will be admitted to a progressive unit bed for further evaluation and management.       Assessment and Plan:  Klebsiella pneumoniae UTI Sepsis has been ruled out UTI might have triggered RVR, Completed a 7 day course of Rocephin Blood cultures so far negative. Urine culture sensitivities reviewed.   Paroxysmal atrial fibrillation with RVR (HCC) Heart rate in 50-110s. She was on Amiodarone gtt changed to 400mg  bid. She is in NSR, Amiodarone decreased to 200 mg twice daily per cardiology Continue metoprolol. Noted to be bradycardic when in SR Continue Eliquis 2.5 twice daily. Cardiology follow-up appreciated.   Acute on chronic combined systolic and diastolic CHF She got IV Lasix with improvement of her respiratory symptoms.  She is euvolemic. Continue to monitor daily weights, strict input and output. Echo reviewed shows EF 30 to 35%, grade 2 diastolic dysfunction. Continue Farxiga, lisinopril, torsemide   Type 2 diabetes mellitus with chronic kidney disease (HCC) hold Tradjenta Continue 70/30 BID. Eating fair. Continue accucheks, sliding scale insulin per protocol/    Dyslipidemia Continue Crestor therapy (at home she takes  Lipitor)   Arteriosclerotic dementia with depression (HCC) Continue Namenda, sertraline, mirtazepine Continue prn Haldol   Essential hypertension Continue metoprolol, lisinopril.   CAD Continue Eliquis, metoprolol and crestor   Stage 3b CKD Kidney function stable. Avoid nephrotoxic medications Monitor daily renal function   PT/ OT recommended STR. Patient will be discharged to  [facility] and spoke to patient's son Malloree Smisek about patient's discharge. He verbalizes understanding and agrees with the plan.      Consultants: Cardiology Procedures performed: None Disposition: Skilled nursing facility Diet recommendation:  Discharge Diet Orders (From admission, onward)     Start     Ordered   08/25/23 0000  Diet - low sodium heart healthy        08/25/23 1311   08/25/23 0000  Diet Carb Modified        08/25/23 1311           Cardiac and Carb modified diet DISCHARGE MEDICATION: Allergies as of 08/25/2023       Reactions   Ciprofloxacin Anaphylaxis   Sulfamethoxazole-trimethoprim Other (See Comments), Rash   Went into [employer] [place].   Augmentin [amoxicillin-pot Clavulanate] Hives   Ampicillin    Hydrochlorothiazide    Levofloxacin Other (See Comments)   Losartan Potassium-hctz Other (See Comments)   Other reaction(s): Unknown   Sulfa Antibiotics Other (See Comments)   Levonne Spiller Steven's Johnsons   Sulfasalazine    unknown   Levaquin [levofloxacin In D5w]         Medication List     STOP taking these medications    amLODipine 5 MG tablet Commonly known as: NORVASC   atorvastatin 40 MG tablet Commonly known as: LIPITOR   haloperidol 1 MG tablet Commonly known as: HALDOL   insulin lispro 100 UNIT/ML KwikPen Commonly known as: HUMALOG   Lantus SoloStar 100 UNIT/ML Solostar Pen Generic drug: insulin glargine   metoprolol tartrate 25 MG tablet Commonly known as: LOPRESSOR Replaced by: metoprolol succinate 25 MG 24 hr tablet   NovoLOG Mix 70/30 FlexPen (70-30) 100 UNIT/ML FlexPen Generic drug: insulin aspart protamine - aspart   pantoprazole 40 MG tablet Commonly known as: PROTONIX   traMADol 50 MG tablet Commonly known as: ULTRAM       TAKE these medications    amiodarone 200 MG tablet Commonly known as: PACERONE Take 1 tablet (200 mg total) by mouth 2 (two) times daily. What changed: when to take  this   apixaban 2.5 MG Tabs tablet Commonly known as: ELIQUIS Take 1 tablet (2.5 mg total) by mouth 2 (two) times daily.   ascorbic acid 500 MG tablet Commonly known as: VITAMIN C Take 500 mg by mouth 2 (two) times daily. Taking on mondays   bisacodyl 5 MG EC tablet Commonly known as: bisacodyl Take 1 tablet (5 mg total) by mouth daily as needed for moderate constipation.   clopidogrel 75 MG tablet Commonly known as: PLAVIX Take 1 tablet (75 mg total) by mouth daily.   Cranberry 425 MG Caps Take 1 capsule by mouth daily.   cyanocobalamin 1000 MCG tablet Commonly known as: VITAMIN B12 Take 1,000 mcg by mouth daily.   dapagliflozin propanediol 10 MG Tabs tablet Commonly known as: FARXIGA Take 1 tablet (10 mg total) by mouth daily. Start taking on: August 26, 2023   feeding supplement (GLUCERNA SHAKE) Liqd Take 237 mLs by mouth 3 (three) times daily between meals.   Glucagon Emergency 1 MG Kit   insulin lispro protamine-lispro (75-25) 100 UNIT/ML Susp  injection Commonly known as: HUMALOG 75/25 MIX Inject 20 Units into the skin daily.   ipratropium-albuterol 0.5-2.5 (3) MG/3ML Soln Commonly known as: DUONEB Take 3 mLs by nebulization every 12 (twelve) hours as needed.   lisinopril 20 MG tablet Commonly known as: ZESTRIL Take 1 tablet (20 mg total) by mouth daily. Start taking on: August 26, 2023   memantine 10 MG tablet Commonly known as: NAMENDA Take 10 mg by mouth 2 (two) times daily.   metoprolol succinate 25 MG 24 hr tablet Commonly known as: TOPROL-XL Take 1 tablet (25 mg total) by mouth daily. Start taking on: August 26, 2023 Replaces: metoprolol tartrate 25 MG tablet   mirabegron ER 50 MG Tb24 tablet Commonly known as: MYRBETRIQ Take 50 mg by mouth daily.   mirtazapine 7.5 MG tablet Commonly known as: REMERON Take 7.5 mg by mouth at bedtime.   multivitamin with minerals tablet Take 1 tablet by mouth daily.   nitrofurantoin 100 MG  capsule Commonly known as: MACRODANTIN Take 1 capsule (100 mg total) by mouth 2 (two) times daily for 7 days.   nitroGLYCERIN 0.4 MG SL tablet Commonly known as: NITROSTAT Place 1 tablet (0.4 mg total) under the tongue every 5 (five) minutes as needed for chest pain.   Olopatadine HCl 0.2 % Soln   Polyethyl Glycol-Propyl Glycol 0.4-0.3 % Soln Apply 1 drop to eye every 4 (four) hours as needed.   polyethylene glycol 17 g packet Commonly known as: MIRALAX / GLYCOLAX Take 17 g by mouth daily.   Premarin vaginal cream Generic drug: conjugated estrogens Place 1 Applicatorful vaginally daily. Apply 0.5mg  (pea-sized amount)  just inside the vaginal introitus with a finger-tip on  Monday, Wednesday and Friday nights.   senna-docusate 8.6-50 MG tablet Commonly known as: Senokot-S Take 1 tablet by mouth 2 (two) times daily.   sertraline 25 MG tablet Commonly known as: ZOLOFT Take 25 mg by mouth daily.   torsemide 20 MG tablet Commonly known as: DEMADEX Take 20 mg by mouth daily.   Tradjenta 5 MG Tabs tablet Generic drug: linagliptin Take 5 mg by mouth daily.   Vitamin D (Ergocalciferol) 1.25 MG (50000 UNIT) Caps capsule Commonly known as: DRISDOL Take 50,000 Units by mouth every 7 (seven) days.        Follow-up Information     Gracelyn Nurse, MD. Schedule an appointment as soon as possible for a visit .   Specialty: Internal Medicine Contact information: [address] [place] [address] [phone]         [facility] at [facility] .   Specialty: Emergency Medicine Why: If symptoms worsen Contact information: [address] [place] [address] [phone]        Marcina Millard, MD. Go in 1 week(s).   Specialty: Cardiology Contact information: [address] [facility] [place] [address] [phone]                Discharge Exam: Ceasar Mons Weights    08/19/23 2140  Weight: 68.9 kg   General - Elderly Caucasian female, no apparent distress HEENT - PERRLA, EOMI, atraumatic head, hard of hearing Lung - Clear, faint rales at the bases Heart - S1, S2 heard, no murmurs, rubs, trace pedal edema Neuro - Alert, awake and oriented x 3, non focal exam. Skin - Warm and dry.    Condition at discharge: stable  The results of significant diagnostics from this hospitalization (including imaging, microbiology, ancillary and laboratory)  are listed below for reference.   Imaging Studies: ECHOCARDIOGRAM COMPLETE  Result Date: 08/20/2023    ECHOCARDIOGRAM REPORT   Patient Name:   KERIA DLUGOSZ Date of Exam: 08/20/2023 Medical Rec #:  [id number]        Height:       67.0 in Accession #:    [id number]       Weight:       152.0 lb Date of Birth:  12-20-1933         BSA:          1.800 m Patient Age:    89 years         BP:           62/96 mmHg Patient Gender: F                HR:           105 bpm. Exam Location:  [facility] Procedure: 2D Echo, Cardiac Doppler, Color Doppler and Intracardiac            Opacification Agent Indications:     Atrial Fibrillation  History:         Patient has prior history of Echocardiogram examinations, most                  recent 11/29/2018. Previous Myocardial Infarction,                  Arrythmias:Atrial Fibrillation; Risk Factors:Hypertension,                  Diabetes and Dyslipidemia. CKD.  Sonographer:     Mikki Harbor Referring Phys:  [id number] JAN A MANSY Diagnosing Phys: Debbe Odea MD  Sonographer Comments: Technically difficult study due to poor echo windows. IMPRESSIONS  1. Left ventricular ejection fraction, by estimation, is 30 to 35%. The left ventricle has moderate to severely decreased function. The left ventricle demonstrates global hypokinesis. There is moderate left ventricular hypertrophy. Left ventricular diastolic parameters are consistent with Grade II diastolic dysfunction (pseudonormalization).  2. Right  ventricular systolic function is low normal. The right ventricular size is not well visualized. There is moderately elevated pulmonary artery systolic pressure.  3. The mitral valve is degenerative. Mild mitral valve regurgitation. Mild mitral stenosis. The mean mitral valve gradient is 4.5 mmHg.  4. The aortic valve is tricuspid. Aortic valve regurgitation is not visualized. Aortic valve sclerosis is present, with no evidence of aortic valve stenosis.  5. The inferior vena cava is normal in size with <50% respiratory variability, suggesting right atrial pressure of 8 mmHg. FINDINGS  Left Ventricle: Left ventricular ejection fraction, by estimation, is 30 to 35%. The left ventricle has moderate to severely decreased function. The left ventricle demonstrates global hypokinesis. Definity contrast agent was given IV to delineate the left ventricular endocardial borders. The left ventricular internal cavity size was normal in size. There is moderate left ventricular hypertrophy. Left ventricular diastolic parameters are consistent with Grade II diastolic dysfunction (pseudonormalization). Right Ventricle: The right ventricular size is not well visualized. No increase in right ventricular wall thickness. Right ventricular systolic function is low normal. There is moderately elevated pulmonary artery systolic pressure. The tricuspid regurgitant velocity is 3.13 m/s, and with an assumed right atrial pressure of 8 mmHg, the estimated right ventricular systolic pressure is 47.2 mmHg. Left Atrium: Left atrial size was normal in size. Right Atrium: Right atrial size was normal in size. Pericardium: There is no evidence of pericardial effusion. Mitral  Valve: The mitral valve is degenerative in appearance. Mild mitral valve regurgitation. Mild mitral valve stenosis. MV peak gradient, 8.9 mmHg. The mean mitral valve gradient is 4.5 mmHg. Tricuspid Valve: The tricuspid valve is normal in structure. Tricuspid valve regurgitation is  mild. Aortic Valve: The aortic valve is tricuspid. Aortic valve regurgitation is not visualized. Aortic valve sclerosis is present, with no evidence of aortic valve stenosis. Aortic valve mean gradient measures 3.0 mmHg. Aortic valve peak gradient measures 6.0  mmHg. Aortic valve area, by VTI measures 2.15 cm. Pulmonic Valve: The pulmonic valve was normal in structure. Pulmonic valve regurgitation is mild. Aorta: The aortic root is normal in size and structure. Venous: The inferior vena cava is normal in size with less than 50% respiratory variability, suggesting right atrial pressure of 8 mmHg. IAS/Shunts: No atrial level shunt detected by color flow Doppler.  LEFT VENTRICLE PLAX 2D LVIDd:         4.10 cm   Diastology LVIDs:         3.20 cm   LV e' medial:    5.87 cm/s LV PW:         1.40 cm   LV E/e' medial:  23.0 LV IVS:        1.50 cm   LV e' lateral:   6.74 cm/s LVOT diam:     1.90 cm   LV E/e' lateral: 20.0 LV SV:         52 LV SV Index:   29 LVOT Area:     2.84 cm  LEFT ATRIUM           Index LA diam:      4.40 cm 2.44 cm/m LA Vol (A4C): 48.9 ml 27.17 ml/m  AORTIC VALVE                    PULMONIC VALVE AV Area (Vmax):    1.89 cm     PV Vmax:       0.93 m/s AV Area (Vmean):   1.90 cm     PV Peak grad:  3.4 mmHg AV Area (VTI):     2.15 cm AV Vmax:           122.00 cm/s AV Vmean:          77.000 cm/s AV VTI:            0.240 m AV Peak Grad:      6.0 mmHg AV Mean Grad:      3.0 mmHg LVOT Vmax:         81.40 cm/s LVOT Vmean:        51.700 cm/s LVOT VTI:          0.182 m LVOT/AV VTI ratio: 0.76  AORTA Ao Root diam: 3.40 cm MITRAL VALVE                TRICUSPID VALVE MV Area (PHT): 2.42 cm     TR Peak grad:   39.2 mmHg MV Area VTI:   1.42 cm     TR Vmax:        313.00 cm/s MV Peak grad:  8.9 mmHg MV Mean grad:  4.5 mmHg     SHUNTS MV Vmax:       1.49 m/s     Systemic VTI:  0.18 m MV Vmean:      95.2 cm/s    Systemic Diam: 1.90 cm MV Decel Time: 313 msec MV E velocity: 135.00 cm/s MV A  velocity: 129.00 cm/s  MV E/A ratio:  1.05 Debbe Odea MD Electronically signed by Debbe Odea MD Signature Date/Time: 08/20/2023/1:33:51 PM    Final     Microbiology: Results for orders placed or performed during the hospital encounter of 08/19/23  Urine Culture     Status: Abnormal   Collection Time: 08/19/23 12:59 PM   Specimen: Urine, Random  Result Value Ref Range Status   Specimen Description   Final    URINE, RANDOM Performed at [facility], [address] [place], [place] [address]    Special Requests   Final    NONE Reflexed from [id number] Performed at [facility], [address] [place], [place] [address]    Culture (A)  Final    >=100,000 COLONIES/mL KLEBSIELLA PNEUMONIAE ESCHERICHIA COLI    Report Status 08/22/2023 FINAL  Final   Organism ID, Bacteria ESCHERICHIA COLI  Final   Organism ID, Bacteria KLEBSIELLA PNEUMONIAE (A)  Final      Susceptibility   Escherichia coli - MIC*    AMPICILLIN <=2 SENSITIVE Sensitive     CEFAZOLIN <=4 SENSITIVE Sensitive     CEFEPIME <=0.12 SENSITIVE Sensitive     CEFTRIAXONE <=0.25 SENSITIVE Sensitive     CIPROFLOXACIN <=0.25 SENSITIVE Sensitive     GENTAMICIN <=1 SENSITIVE Sensitive     IMIPENEM <=0.25 SENSITIVE Sensitive     NITROFURANTOIN <=16 SENSITIVE Sensitive     TRIMETH/SULFA <=20 SENSITIVE Sensitive     AMPICILLIN/SULBACTAM <=2 SENSITIVE Sensitive     PIP/TAZO <=4 SENSITIVE Sensitive     * ESCHERICHIA COLI   Klebsiella pneumoniae - MIC*    AMPICILLIN RESISTANT Resistant     CEFAZOLIN <=4 SENSITIVE Sensitive     CEFEPIME <=0.12 SENSITIVE Sensitive     CEFTRIAXONE <=0.25 SENSITIVE Sensitive     CIPROFLOXACIN >=4 RESISTANT Resistant     GENTAMICIN <=1 SENSITIVE Sensitive     IMIPENEM <=0.25 SENSITIVE Sensitive     NITROFURANTOIN 64 INTERMEDIATE Intermediate     TRIMETH/SULFA <=20 SENSITIVE Sensitive     AMPICILLIN/SULBACTAM 4 SENSITIVE Sensitive     PIP/TAZO <=4 SENSITIVE Sensitive     * >=100,000 COLONIES/mL  KLEBSIELLA PNEUMONIAE  Blood Culture (routine x 2)     Status: None   Collection Time: 08/19/23  7:40 PM   Specimen: BLOOD  Result Value Ref Range Status   Specimen Description BLOOD BLOOD LEFT ARM  Final   Special Requests   Final    BOTTLES DRAWN AEROBIC AND ANAEROBIC Blood Culture results may not be optimal due to an inadequate volume of blood received in culture bottles   Culture   Final    NO GROWTH 5 DAYS Performed at [facility], [address] [place], [place] [address]    Report Status 08/24/2023 FINAL  Final  Blood Culture (routine x 2)     Status: None   Collection Time: 08/19/23  7:48 PM   Specimen: BLOOD  Result Value Ref Range Status   Specimen Description BLOOD BLOOD RIGHT HAND  Final   Special Requests   Final    BOTTLES DRAWN AEROBIC AND ANAEROBIC Blood Culture results may not be optimal due to an inadequate volume of blood received in culture bottles   Culture   Final    NO GROWTH 5 DAYS Performed at [facility], [address], [place], [place] [address]    Report Status 08/24/2023 FINAL  Final    Labs: CBC: Recent Labs  Lab 08/19/23 1259 08/20/23 0442  08/23/23 0423 08/24/23 0751  WBC 5.8 10.4 5.8 5.3  NEUTROABS 4.9  --   --   --   HGB 14.4 12.7 12.6 12.3  HCT 44.1 38.5 39.0 36.7  MCV 97.4 96.5 98.5 96.1  PLT 151 169 145* 141*   Basic Metabolic Panel: Recent Labs  Lab 08/19/23 1259 08/20/23 0442 08/23/23 0423 08/24/23 0751 08/25/23 1017  NA 140 139 140 139 139  K 4.9 4.4 3.9 4.1 4.2  CL 113* 112* 112* 111 109  CO2 18* 17* 21* 19* 22  GLUCOSE 206* 220* 142* 119* 163*  BUN 29* 31* 36* 31* 24*  CREATININE 1.08* 1.15* 1.30* 1.22* 1.09*  CALCIUM 9.0 8.7* 8.6* 8.7* 8.9  MG 2.2  --  2.3 2.1  --    Liver Function Tests: No results for input(s): "AST", "ALT", "ALKPHOS", "BILITOT", "PROT", "ALBUMIN" in the last 168 hours. CBG: Recent Labs  Lab 08/24/23 1228 08/24/23 1520 08/24/23 2115 08/25/23 0914 08/25/23 1132   GLUCAP 124* 122* 130* 148* 156*    Discharge time spent: greater than 30 minutes.  Signed: Lucile Shutters, MD [facility] 08/25/2023

## 2023-08-25 NOTE — Progress Notes (Signed)
Occupational Therapy Treatment Patient Details Name: Pamela [name] MRN: [id number] DOB: August 13, 1934 Today's Date: 08/25/2023   History of present illness 89yo with h/o PAF, chronic diastolic CHF, HTN, DM,and HLD who presented on 9/19 with tachycardia and palpitations, found to be in afib with RVR.  She was given Dilt x 1 and Lopressor as well as Macrobid and Ceftriaxone.  She was placed on Amiodarone drip and transitioned to PO per cardiology. She was found to have Klebsiella UTI but not sepsis.   OT comments  Ms Kalinowski was seen for OT treatment on this date. Upon arrival to room pt reclined in bed, agreeable to tx. Pt requires MIN A exit bed, fair sitting balance. MOD A + RW sit<>stand x4 trials. Pt incontinent of urine, clean linen change provided, MAX A periaccess in standing. Returned to bed and SETUP for breakfast per pt request.Pt making progress toward goals, will continue to follow POC. Discharge recommendation remains appropriate.       If plan is discharge home, recommend the following:  A lot of help with walking and/or transfers;A lot of help with bathing/dressing/bathroom;Assistance with cooking/housework;Assist for transportation   Equipment Recommendations  Other (comment) (defer)    Recommendations for Other Services      Precautions / Restrictions Precautions Precautions: Fall Restrictions Weight Bearing Restrictions: No       Mobility Bed Mobility Overal bed mobility: Needs Assistance Bed Mobility: Supine to Sit, Sit to Supine     Supine to sit: Min assist Sit to supine: Mod assist        Transfers Overall transfer level: Needs assistance Equipment used: Rolling walker (2 wheels) Transfers: Sit to/from Stand Sit to Stand: Mod assist           General transfer comment: x4 stands from bed height     Balance Overall balance assessment: Needs assistance Sitting-balance support: Single extremity supported, Feet supported Sitting balance-Leahy  Scale: Fair     Standing balance support: Bilateral upper extremity supported, During functional activity Standing balance-Leahy Scale: Poor                             ADL either performed or assessed with clinical judgement   ADL Overall ADL's : Needs assistance/impaired                                       General ADL Comments: MAX A pericare standing. SETUP self-feeding at bed level.      Cognition Arousal: Alert Behavior During Therapy: WFL for tasks assessed/performed Overall Cognitive Status: No family/caregiver present to determine baseline cognitive functioning Area of Impairment: Following commands                       Following Commands: Follows one step commands consistently                           Pertinent Vitals/ Pain       Pain Assessment Pain Assessment: No/denies pain   Frequency  Min 1X/week        Progress Toward Goals  OT Goals(current goals can now be found in the care plan section)  Progress towards OT goals: Progressing toward goals  Acute Rehab OT Goals Patient Stated Goal: feel stronger OT Goal Formulation: With patient Time For Goal Achievement: 09/04/23  Potential to Achieve Goals: Fair ADL Goals Pt Will Perform Upper Body Bathing: with modified independence Pt Will Perform Lower Body Bathing: with min assist Pt Will Perform Upper Body Dressing: with modified independence Pt Will Perform Lower Body Dressing: with min assist  Plan      Co-evaluation                 AM-PAC OT "6 Clicks" Daily Activity     Outcome Measure   Help from another person eating meals?: None Help from another person taking care of personal grooming?: A Little Help from another person toileting, which includes using toliet, bedpan, or urinal?: A Lot Help from another person bathing (including washing, rinsing, drying)?: A Lot Help from another person to put on and taking off regular upper body  clothing?: A Little Help from another person to put on and taking off regular lower body clothing?: A Lot 6 Click Score: 16    End of Session Equipment Utilized During Treatment: Gait belt;Rolling walker (2 wheels)  OT Visit Diagnosis: Unsteadiness on feet (R26.81);Muscle weakness (generalized) (M62.81)   Activity Tolerance Patient tolerated treatment well   Patient Left in bed;with call bell/phone within reach;with bed alarm set   Nurse Communication          Time: 8657-8469 OT Time Calculation (min): 17 min  Charges: OT General Charges $OT Visit: 1 Visit OT Treatments $Self Care/Home Management : 8-22 mins  Kathie Dike, M.S. OTR/L  08/25/23, 9:48 AM  ascom [phone]

## 2023-08-25 NOTE — Plan of Care (Signed)
Problem: Respiratory: Goal: Ability to maintain adequate ventilation will improve Outcome: Progressing

## 2024-01-01 DEATH — deceased
